# Patient Record
Sex: Male | Born: 1940 | Race: White | Hispanic: No | Smoking: Never smoker
Health system: Southern US, Community
[De-identification: ages and names within clinical notes are randomized; demographics above are authoritative.]

## PROBLEM LIST (undated history)

## (undated) DIAGNOSIS — E119 Type 2 diabetes mellitus without complications: Secondary | ICD-10-CM

## (undated) DIAGNOSIS — I10 Essential (primary) hypertension: Secondary | ICD-10-CM

## (undated) DIAGNOSIS — N183 Chronic kidney disease, stage 3 unspecified: Secondary | ICD-10-CM

## (undated) DIAGNOSIS — R296 Repeated falls: Secondary | ICD-10-CM

## (undated) DIAGNOSIS — I429 Cardiomyopathy, unspecified: Secondary | ICD-10-CM

## (undated) DIAGNOSIS — I4510 Unspecified right bundle-branch block: Secondary | ICD-10-CM

## (undated) DIAGNOSIS — I5042 Chronic combined systolic (congestive) and diastolic (congestive) heart failure: Secondary | ICD-10-CM

## (undated) DIAGNOSIS — C649 Malignant neoplasm of unspecified kidney, except renal pelvis: Secondary | ICD-10-CM

## (undated) DIAGNOSIS — I509 Heart failure, unspecified: Secondary | ICD-10-CM

## (undated) DIAGNOSIS — I350 Nonrheumatic aortic (valve) stenosis: Secondary | ICD-10-CM

## (undated) HISTORY — DX: Type 2 diabetes mellitus without complications: E11.9

## (undated) HISTORY — DX: Essential (primary) hypertension: I10

## (undated) HISTORY — PX: ROTATOR CUFF REPAIR: SHX139

## (undated) HISTORY — DX: Cardiomyopathy, unspecified: I42.9

## (undated) NOTE — *Deleted (*Deleted)
Physical Medicine and Rehabilitation Admission H&P    Chief Complaint  Patient presents with  . Weakness  : HPI: Brett Williamson is an 61 year old right-handed male with history of hypertension, diabetes mellitus.  Per chart review patient lives alone independent prior to admission using a Rollator.  Apartment with one level.  He has a granddaughter girlfriend in the area that check on him routinely.  Presented 06/19/2020 with reports of frequent falls as well as generalized weakness.  No reported loss of consciousness.  CT the head showed moderately large right-sided subdural hematoma with 8 mm leftward midline shift.  Admission chemistry sodium 130 glucose 177 BUN 29 creatinine 1.54 CK 86 hemoglobin 15.3, WBC 14,800, urine culture greater than 100,000 gram-negative rods placed on Rocephin, lactic acid 1.4.  Patient underwent craniotomy hematoma evacuation 06/19/2020 per Dr. Venetia Maxon.  Follow-up MRI 06/21/2020 showed interval decompression of subdural hematoma with significantly improved mass-effect.  Midline shift decreased to 3 mm.  Echocardiogram ejection fraction 55 to 60% no wall motion abnormalities grade 2 diastolic dysfunction.  Maintained on Keppra for seizure prophylaxis.  Wound care nurse follow-up for multiple wounds on scrotum buttocks perineum wound care as directed.  Therapy evaluations completed and patient was admitted for a comprehensive rehab program.  Review of Systems  Constitutional: Positive for malaise/fatigue. Negative for chills and fever.  HENT: Negative for hearing loss.   Eyes: Negative for blurred vision and double vision.  Respiratory: Negative for cough and shortness of breath.   Cardiovascular: Negative for chest pain, palpitations and leg swelling.  Gastrointestinal: Positive for constipation. Negative for heartburn, nausea and vomiting.  Genitourinary: Positive for urgency. Negative for dysuria and hematuria.  Musculoskeletal: Positive for falls and myalgias.   Skin: Negative for rash.  Neurological: Positive for weakness and headaches.  All other systems reviewed and are negative.  History reviewed. No pertinent past medical history. Past Surgical History:  Procedure Laterality Date  . CRANIOTOMY Right 06/19/2020   Procedure: CRANIOTOMY HEMATOMA EVACUATION SUBDURAL;  Surgeon: Maeola Harman, MD;  Location: [facility] OR;  Service: Neurosurgery;  Laterality: Right;   History reviewed. No pertinent family history. Social History:  reports that he has never smoked. He has never used smokeless tobacco. He reports that he does not drink alcohol and does not use drugs. Allergies: No Known Allergies Medications Prior to Admission  Medication Sig Dispense Refill  . benazepril (LOTENSIN) 20 MG tablet Take 20 mg by mouth daily.    . hydrochlorothiazide (HYDRODIURIL) 25 MG tablet Take 25 mg by mouth daily.    Marland Kitchen ibuprofen (ADVIL) 200 MG tablet Take 200 mg by mouth every 4 (four) hours as needed for mild pain.      Drug Regimen Review Drug regimen was reviewed and remains appropriate with no significant issues identified  Home: Home Living Family/patient expects to be discharged to:: Inpatient rehab Living Arrangements: Alone Available Help at Discharge:  (grandaughter lives nearby but only comes over when called; girlfriend lives 2 minutes away but doesnt come over everyday) Type of Home: Apartment Home Layout: One level Bathroom Shower/Tub: Engineer, manufacturing systems: Handicapped height Home Equipment: Grab bars - tub/shower, Grab bars - toilet, Adaptive equipment Adaptive Equipment: Sock aid   Functional History: Prior Function Level of Independence: Independent with assistive device(s) Comments: Pt reports he uses a sock aid for socks. He has long standing numbness/tingling in fingertips bil hands.   Heambulates with RW.  He drives.  He grocery shops sometimes, and daughter and girlfriend assist PRN.  Functional Status:  Mobility: Bed  Mobility Overal bed mobility: Needs Assistance Bed Mobility: Supine to Sit Supine to sit: Mod assist General bed mobility comments: modA with flat bed, pt able to move BLE to EOB but requires increased time, use of rail, and modA to raise trunk from [facility] Transfers Overall transfer level: Needs assistance Equipment used: Rolling walker (2 wheeled) Transfers: Sit to/from Stand, [employer] Transfers Sit to Stand: Min assist Stand pivot transfers: Min assist General transfer comment: modA initially, but able to complete x3 in a row at end of session with minA and verbal cues. but requires ~3 min to complete  Ambulation/Gait Ambulation/Gait assistance: Min assist Gait Distance (Feet): 15 Feet Assistive device: Rolling walker (2 wheeled) Gait Pattern/deviations: Step-through pattern, Decreased stride length, Trunk flexed General Gait Details: pt able to walk in room with RW and minA for line managment/stability. No LOB during session.  Gait velocity: decreased Gait velocity interpretation: <1.31 ft/sec, indicative of household ambulator    ADL: ADL Overall ADL's : Needs assistance/impaired Eating/Feeding: Set up, Sitting, Bed level Grooming: Wash/dry hands, Wash/dry face, Oral care, Brushing hair, Set up, Sitting Upper Body Bathing: Minimal assistance, Sitting Lower Body Bathing: Moderate assistance, Sit to/from stand Upper Body Dressing : Moderate assistance, Sitting Lower Body Dressing: Maximal assistance, Sit to/from stand Lower Body Dressing Details (indicate cue type and reason): uses sock aid at home  Toilet Transfer: Moderate assistance, +2 for physical assistance, +2 for safety/equipment, Stand-pivot, BSC, RW Toileting- Clothing Manipulation and Hygiene: Sit to/from stand, Total assistance Toileting - Clothing Manipulation Details (indicate cue type and reason): Pt incontinent of urine and stool.  Assisted with peri care in standing  Functional mobility during ADLs: Moderate  assistance, +2 for physical assistance, +2 for safety/equipment, Rolling walker  Cognition: Cognition Overall Cognitive Status: Within Functional Limits for tasks assessed Orientation Level: Oriented X4 Cognition Arousal/Alertness: Awake/alert Behavior During Therapy: WFL for tasks assessed/performed Overall Cognitive Status: Within Functional Limits for tasks assessed General Comments: WFL for basic info and tasks, pt able to follow instructions given and navigate around room without cues/assist.   Physical Exam: Blood pressure 138/84, pulse 78, temperature 98.7 F (37.1 C), temperature source Oral, resp. rate 20, height 5\' 11"  (1.803 m), weight 120.7 kg, SpO2 96 %. Physical Exam HENT:     Head:     Comments: Craniotomy site clean and dry Neurological:     Comments: Patient is alert sitting up in bed.  Makes good eye contact with examiner.  Provides name age and date of birth.  Follows simple commands.     Results for orders placed or performed during the hospital encounter of 06/19/20 (from the past 48 hour(s))  Glucose, capillary     Status: Abnormal   Collection Time: 06/23/20  9:16 AM  Result Value Ref Range   Glucose-Capillary 124 (H) 70 - 99 mg/dL    Comment: Glucose reference range applies only to samples taken after fasting for at least 8 hours.  Glucose, capillary     Status: Abnormal   Collection Time: 06/23/20 12:23 PM  Result Value Ref Range   Glucose-Capillary 153 (H) 70 - 99 mg/dL    Comment: Glucose reference range applies only to samples taken after fasting for at least 8 hours.  Glucose, capillary     Status: Abnormal   Collection Time: 06/23/20  5:10 PM  Result Value Ref Range   Glucose-Capillary 182 (H) 70 - 99 mg/dL    Comment: Glucose reference range applies only to  samples taken after fasting for at least 8 hours.  Glucose, capillary     Status: Abnormal   Collection Time: 06/23/20 10:31 PM  Result Value Ref Range   Glucose-Capillary 136 (H) 70 - 99  mg/dL    Comment: Glucose reference range applies only to samples taken after fasting for at least 8 hours.  Glucose, capillary     Status: Abnormal   Collection Time: 06/24/20  8:12 AM  Result Value Ref Range   Glucose-Capillary 150 (H) 70 - 99 mg/dL    Comment: Glucose reference range applies only to samples taken after fasting for at least 8 hours.   Comment 1 Notify RN    Comment 2 Document in Chart   Glucose, capillary     Status: Abnormal   Collection Time: 06/24/20 12:34 PM  Result Value Ref Range   Glucose-Capillary 153 (H) 70 - 99 mg/dL    Comment: Glucose reference range applies only to samples taken after fasting for at least 8 hours.   Comment 1 Notify RN    Comment 2 Document in Chart   Glucose, capillary     Status: Abnormal   Collection Time: 06/24/20  4:42 PM  Result Value Ref Range   Glucose-Capillary 160 (H) 70 - 99 mg/dL    Comment: Glucose reference range applies only to samples taken after fasting for at least 8 hours.   Comment 1 Notify RN    Comment 2 Document in Chart   Glucose, capillary     Status: Abnormal   Collection Time: 06/24/20 10:36 PM  Result Value Ref Range   Glucose-Capillary 182 (H) 70 - 99 mg/dL    Comment: Glucose reference range applies only to samples taken after fasting for at least 8 hours.   No results found.     Medical Problem List and Plan: 1.  Decreased functional mobility with generalized weakness as well as reported multiple falls secondary to traumatic right side subdural hematoma.  Status post craniotomy hematoma evacuation 06/19/2020  -patient may *** shower  -ELOS/Goals: *** 2.  Antithrombotics: -DVT/anticoagulation: SCDs.  -antiplatelet therapy: N/A 3. Pain Management: Hydrocodone as needed 4. Mood: Provide emotional support  -antipsychotic agents: N/A 5. Neuropsych: This patient is capable of making decisions on his own behalf. 6. Skin/Wound Care: Gerhardt Butt cream applied to buttocks scrotum perineal area after  cleansing and drying 7. Fluids/Electrolytes/Nutrition: Routine in and outs with follow-up chemistries 8.  Seizure prophylaxis.  Keppra 500 mg twice daily 9.  Gram-negative rod UTI.  Rocephin 06/20/2020 10.  History of hypertension.  Patient on Lotensin 20 mg daily and HCTZ 25 mg daily prior to admission.  Resume as needed 11.  Diet-controlled diabetes mellitus.  Hemoglobin A1c 6.6.  SSI. ***  Mcarthur Rossetti Darnette Lampron, PA-C 06/25/2020

---

## 2019-07-21 ENCOUNTER — Other Ambulatory Visit: Payer: Self-pay

## 2019-07-21 DIAGNOSIS — Z20822 Contact with and (suspected) exposure to covid-19: Secondary | ICD-10-CM

## 2019-07-22 LAB — NOVEL CORONAVIRUS, NAA: SARS-CoV-2, NAA: NOT DETECTED

## 2019-07-27 ENCOUNTER — Telehealth: Payer: Self-pay | Admitting: *Deleted

## 2019-07-27 NOTE — Telephone Encounter (Signed)
Patient called , given negative covid results .

## 2020-06-11 DIAGNOSIS — S065XAA Traumatic subdural hemorrhage with loss of consciousness status unknown, initial encounter: Secondary | ICD-10-CM | POA: Insufficient documentation

## 2020-06-11 DIAGNOSIS — S065X9A Traumatic subdural hemorrhage with loss of consciousness of unspecified duration, initial encounter: Secondary | ICD-10-CM | POA: Insufficient documentation

## 2020-06-11 HISTORY — DX: Traumatic subdural hemorrhage with loss of consciousness status unknown, initial encounter: S06.5XAA

## 2020-06-11 HISTORY — DX: Traumatic subdural hemorrhage with loss of consciousness of unspecified duration, initial encounter: S06.5X9A

## 2020-06-19 ENCOUNTER — Inpatient Hospital Stay (HOSPITAL_COMMUNITY): Payer: Medicare Other | Admitting: Certified Registered"

## 2020-06-19 ENCOUNTER — Encounter (HOSPITAL_COMMUNITY): Payer: Self-pay | Admitting: Internal Medicine

## 2020-06-19 ENCOUNTER — Emergency Department (HOSPITAL_COMMUNITY): Payer: Medicare Other

## 2020-06-19 ENCOUNTER — Inpatient Hospital Stay (HOSPITAL_COMMUNITY)
Admission: EM | Admit: 2020-06-19 | Discharge: 2020-06-25 | DRG: 026 | Disposition: A | Discharge status: Rehabilitation Facility | Payer: Medicare Other | Attending: Neurosurgery | Admitting: Neurosurgery

## 2020-06-19 ENCOUNTER — Inpatient Hospital Stay (HOSPITAL_COMMUNITY): Payer: Medicare Other

## 2020-06-19 ENCOUNTER — Encounter (HOSPITAL_COMMUNITY)
Admission: EM | Disposition: A | Discharge status: Rehabilitation Facility | Payer: Self-pay | Source: Home / Self Care | Attending: Neurosurgery

## 2020-06-19 DIAGNOSIS — I10 Essential (primary) hypertension: Secondary | ICD-10-CM

## 2020-06-19 DIAGNOSIS — W1800XA Striking against unspecified object with subsequent fall, initial encounter: Secondary | ICD-10-CM | POA: Diagnosis present

## 2020-06-19 DIAGNOSIS — E871 Hypo-osmolality and hyponatremia: Secondary | ICD-10-CM | POA: Diagnosis present

## 2020-06-19 DIAGNOSIS — Z8679 Personal history of other diseases of the circulatory system: Secondary | ICD-10-CM | POA: Diagnosis present

## 2020-06-19 DIAGNOSIS — Z9181 History of falling: Secondary | ICD-10-CM | POA: Diagnosis not present

## 2020-06-19 DIAGNOSIS — W19XXXA Unspecified fall, initial encounter: Secondary | ICD-10-CM | POA: Diagnosis present

## 2020-06-19 DIAGNOSIS — L89151 Pressure ulcer of sacral region, stage 1: Secondary | ICD-10-CM | POA: Diagnosis present

## 2020-06-19 DIAGNOSIS — I4510 Unspecified right bundle-branch block: Secondary | ICD-10-CM | POA: Diagnosis present

## 2020-06-19 DIAGNOSIS — Z79899 Other long term (current) drug therapy: Secondary | ICD-10-CM

## 2020-06-19 DIAGNOSIS — I119 Hypertensive heart disease without heart failure: Secondary | ICD-10-CM | POA: Diagnosis present

## 2020-06-19 DIAGNOSIS — N189 Chronic kidney disease, unspecified: Secondary | ICD-10-CM

## 2020-06-19 DIAGNOSIS — L97229 Non-pressure chronic ulcer of left calf with unspecified severity: Secondary | ICD-10-CM | POA: Diagnosis present

## 2020-06-19 DIAGNOSIS — Z20822 Contact with and (suspected) exposure to covid-19: Secondary | ICD-10-CM | POA: Diagnosis present

## 2020-06-19 DIAGNOSIS — R21 Rash and other nonspecific skin eruption: Secondary | ICD-10-CM | POA: Diagnosis present

## 2020-06-19 DIAGNOSIS — R011 Cardiac murmur, unspecified: Secondary | ICD-10-CM | POA: Diagnosis present

## 2020-06-19 DIAGNOSIS — S065XAA Traumatic subdural hemorrhage with loss of consciousness status unknown, initial encounter: Secondary | ICD-10-CM

## 2020-06-19 DIAGNOSIS — W010XXA Fall on same level from slipping, tripping and stumbling without subsequent striking against object, initial encounter: Secondary | ICD-10-CM | POA: Diagnosis present

## 2020-06-19 DIAGNOSIS — R8281 Pyuria: Secondary | ICD-10-CM | POA: Diagnosis present

## 2020-06-19 DIAGNOSIS — R296 Repeated falls: Secondary | ICD-10-CM | POA: Diagnosis present

## 2020-06-19 DIAGNOSIS — E11622 Type 2 diabetes mellitus with other skin ulcer: Secondary | ICD-10-CM | POA: Diagnosis present

## 2020-06-19 DIAGNOSIS — S065X9A Traumatic subdural hemorrhage with loss of consciousness of unspecified duration, initial encounter: Principal | ICD-10-CM | POA: Diagnosis present

## 2020-06-19 DIAGNOSIS — Z6837 Body mass index (BMI) 37.0-37.9, adult: Secondary | ICD-10-CM | POA: Diagnosis not present

## 2020-06-19 DIAGNOSIS — G8194 Hemiplegia, unspecified affecting left nondominant side: Secondary | ICD-10-CM | POA: Diagnosis present

## 2020-06-19 DIAGNOSIS — E669 Obesity, unspecified: Secondary | ICD-10-CM | POA: Diagnosis present

## 2020-06-19 DIAGNOSIS — E119 Type 2 diabetes mellitus without complications: Secondary | ICD-10-CM | POA: Diagnosis present

## 2020-06-19 DIAGNOSIS — R531 Weakness: Secondary | ICD-10-CM

## 2020-06-19 DIAGNOSIS — N390 Urinary tract infection, site not specified: Secondary | ICD-10-CM

## 2020-06-19 DIAGNOSIS — R7989 Other specified abnormal findings of blood chemistry: Secondary | ICD-10-CM | POA: Diagnosis present

## 2020-06-19 DIAGNOSIS — I6200 Nontraumatic subdural hemorrhage, unspecified: Secondary | ICD-10-CM | POA: Diagnosis present

## 2020-06-19 DIAGNOSIS — W07XXXA Fall from chair, initial encounter: Secondary | ICD-10-CM | POA: Diagnosis present

## 2020-06-19 DIAGNOSIS — J9811 Atelectasis: Secondary | ICD-10-CM | POA: Diagnosis not present

## 2020-06-19 DIAGNOSIS — B9620 Unspecified Escherichia coli [E. coli] as the cause of diseases classified elsewhere: Secondary | ICD-10-CM | POA: Diagnosis present

## 2020-06-19 DIAGNOSIS — E860 Dehydration: Secondary | ICD-10-CM | POA: Diagnosis present

## 2020-06-19 DIAGNOSIS — L8990 Pressure ulcer of unspecified site, unspecified stage: Secondary | ICD-10-CM | POA: Insufficient documentation

## 2020-06-19 HISTORY — PX: CRANIOTOMY: SHX93

## 2020-06-19 LAB — COMPREHENSIVE METABOLIC PANEL
ALT: 15 U/L (ref 0–44)
AST: 18 U/L (ref 15–41)
Albumin: 3.9 g/dL (ref 3.5–5.0)
Alkaline Phosphatase: 83 U/L (ref 38–126)
Anion gap: 10 (ref 5–15)
BUN: 29 mg/dL — ABNORMAL HIGH (ref 8–23)
CO2: 22 mmol/L (ref 22–32)
Calcium: 8.7 mg/dL — ABNORMAL LOW (ref 8.9–10.3)
Chloride: 98 mmol/L (ref 98–111)
Creatinine, Ser: 1.54 mg/dL — ABNORMAL HIGH (ref 0.61–1.24)
GFR, Estimated: 45 mL/min — ABNORMAL LOW (ref >60)
Glucose, Bld: 177 mg/dL — ABNORMAL HIGH (ref 70–99)
Potassium: 4 mmol/L (ref 3.5–5.1)
Sodium: 130 mmol/L — ABNORMAL LOW (ref 135–145)
Total Bilirubin: 2.6 mg/dL — ABNORMAL HIGH (ref 0.3–1.2)
Total Protein: 8.2 g/dL — ABNORMAL HIGH (ref 6.5–8.1)

## 2020-06-19 LAB — CBC WITH DIFFERENTIAL/PLATELET
Abs Immature Granulocytes: 0.07 10*3/uL (ref 0.00–0.07)
Basophils Absolute: 0.1 10*3/uL (ref 0.0–0.1)
Basophils Relative: 1 %
Eosinophils Absolute: 0.1 10*3/uL (ref 0.0–0.5)
Eosinophils Relative: 1 %
HCT: 48.2 % (ref 39.0–52.0)
Hemoglobin: 15.3 g/dL (ref 13.0–17.0)
Immature Granulocytes: 1 %
Lymphocytes Relative: 8 %
Lymphs Abs: 1.2 10*3/uL (ref 0.7–4.0)
MCH: 26.4 pg (ref 26.0–34.0)
MCHC: 31.7 g/dL (ref 30.0–36.0)
MCV: 83.1 fL (ref 80.0–100.0)
Monocytes Absolute: 2.3 10*3/uL — ABNORMAL HIGH (ref 0.1–1.0)
Monocytes Relative: 16 %
Neutro Abs: 11 10*3/uL — ABNORMAL HIGH (ref 1.7–7.7)
Neutrophils Relative %: 73 %
Platelets: 206 10*3/uL (ref 150–400)
RBC: 5.8 MIL/uL (ref 4.22–5.81)
RDW: 15.8 % — ABNORMAL HIGH (ref 11.5–15.5)
WBC: 14.8 10*3/uL — ABNORMAL HIGH (ref 4.0–10.5)
nRBC: 0 % (ref 0.0–0.2)

## 2020-06-19 LAB — URINALYSIS, ROUTINE W REFLEX MICROSCOPIC
Bilirubin Urine: NEGATIVE
Glucose, UA: 150 mg/dL — AB
Ketones, ur: NEGATIVE mg/dL
Nitrite: NEGATIVE
Protein, ur: 100 mg/dL — AB
RBC / HPF: >50 RBC/hpf — ABNORMAL HIGH (ref 0–5)
Specific Gravity, Urine: 1.014 (ref 1.005–1.030)
WBC, UA: >50 WBC/hpf — ABNORMAL HIGH (ref 0–5)
pH: 5 (ref 5.0–8.0)

## 2020-06-19 LAB — TYPE AND SCREEN
ABO/RH(D): O POS
Antibody Screen: NEGATIVE

## 2020-06-19 LAB — LACTIC ACID, PLASMA
Lactic Acid, Venous: 1.8 mmol/L (ref 0.5–1.9)
Lactic Acid, Venous: 1.4 mmol/L (ref 0.5–1.9)

## 2020-06-19 LAB — ABO/RH: ABO/RH(D): O POS

## 2020-06-19 LAB — RESPIRATORY PANEL BY RT PCR (FLU A&B, COVID)
Influenza A by PCR: NEGATIVE
Influenza B by PCR: NEGATIVE
SARS Coronavirus 2 by RT PCR: NEGATIVE

## 2020-06-19 LAB — CK: Total CK: 86 U/L (ref 49–397)

## 2020-06-19 SURGERY — CRANIOTOMY HEMATOMA EVACUATION SUBDURAL
Anesthesia: General | Site: Head | Laterality: Right

## 2020-06-19 MED ORDER — PROMETHAZINE HCL 25 MG/ML IJ SOLN: 6.2500 mg | INTRAMUSCULAR | Status: DC | PRN

## 2020-06-19 MED ORDER — CEFAZOLIN SODIUM 1 G IJ SOLR
INTRAMUSCULAR | Status: AC
  Filled 2020-06-19: qty 20

## 2020-06-19 MED ORDER — PROMETHAZINE HCL 25 MG PO TABS
12.5000 mg | ORAL_TABLET | ORAL | Status: DC | PRN
  Administered 2020-06-22: 12.5 mg via ORAL
  Filled 2020-06-19: qty 1

## 2020-06-19 MED ORDER — BUPIVACAINE-EPINEPHRINE 0.5% -1:200000 IJ SOLN
INTRAMUSCULAR | Status: AC
  Filled 2020-06-19: qty 1

## 2020-06-19 MED ORDER — BUPIVACAINE-EPINEPHRINE 0.5% -1:200000 IJ SOLN
INTRAMUSCULAR | Status: DC | PRN
  Administered 2020-06-19: 5 mL

## 2020-06-19 MED ORDER — CEFAZOLIN SODIUM-DEXTROSE 1-4 GM/50ML-% IV SOLN
INTRAVENOUS | Status: DC | PRN
  Administered 2020-06-19: 2 g via INTRAVENOUS

## 2020-06-19 MED ORDER — SODIUM CHLORIDE 0.9 % IV SOLN
1.0000 g | Freq: Once | INTRAVENOUS | Status: AC
  Administered 2020-06-19: 1 g via INTRAVENOUS
  Filled 2020-06-19: qty 10

## 2020-06-19 MED ORDER — FLEET ENEMA 7-19 GM/118ML RE ENEM: 1.0000 | ENEMA | Freq: Once | RECTAL | Status: DC | PRN

## 2020-06-19 MED ORDER — ALBUMIN HUMAN 5 % IV SOLN: INTRAVENOUS | Status: DC | PRN

## 2020-06-19 MED ORDER — FENTANYL CITRATE (PF) 250 MCG/5ML IJ SOLN
INTRAMUSCULAR | Status: AC
  Filled 2020-06-19: qty 5

## 2020-06-19 MED ORDER — INSULIN ASPART 100 UNIT/ML ~~LOC~~ SOLN
0.0000 [IU] | Freq: Three times a day (TID) | SUBCUTANEOUS | Status: DC
  Administered 2020-06-20: 1 [IU] via SUBCUTANEOUS
  Administered 2020-06-20: 2 [IU] via SUBCUTANEOUS
  Administered 2020-06-21: 1 [IU] via SUBCUTANEOUS
  Administered 2020-06-21 – 2020-06-22 (×4): 2 [IU] via SUBCUTANEOUS
  Administered 2020-06-22: 1 [IU] via SUBCUTANEOUS
  Administered 2020-06-23: 2 [IU] via SUBCUTANEOUS
  Administered 2020-06-23: 1 [IU] via SUBCUTANEOUS
  Administered 2020-06-23 – 2020-06-24 (×3): 2 [IU] via SUBCUTANEOUS
  Administered 2020-06-24 – 2020-06-25 (×2): 1 [IU] via SUBCUTANEOUS

## 2020-06-19 MED ORDER — CEFAZOLIN SODIUM-DEXTROSE 2-4 GM/100ML-% IV SOLN
2.0000 g | Freq: Three times a day (TID) | INTRAVENOUS | Status: AC
  Administered 2020-06-19 – 2020-06-20 (×2): 2 g via INTRAVENOUS
  Filled 2020-06-19 (×2): qty 100

## 2020-06-19 MED ORDER — PHENYLEPHRINE HCL-NACL 10-0.9 MG/250ML-% IV SOLN
INTRAVENOUS | Status: AC
  Filled 2020-06-19: qty 250

## 2020-06-19 MED ORDER — FENTANYL CITRATE (PF) 100 MCG/2ML IJ SOLN: 25.0000 ug | INTRAMUSCULAR | Status: DC | PRN

## 2020-06-19 MED ORDER — CHLORHEXIDINE GLUCONATE CLOTH 2 % EX PADS
6.0000 | MEDICATED_PAD | Freq: Every day | CUTANEOUS | Status: DC
  Administered 2020-06-20 – 2020-06-24 (×5): 6 via TOPICAL

## 2020-06-19 MED ORDER — ONDANSETRON HCL 4 MG PO TABS: 4.0000 mg | ORAL_TABLET | ORAL | Status: DC | PRN

## 2020-06-19 MED ORDER — ROCURONIUM BROMIDE 10 MG/ML (PF) SYRINGE
PREFILLED_SYRINGE | INTRAVENOUS | Status: AC
  Filled 2020-06-19: qty 10

## 2020-06-19 MED ORDER — PHENYLEPHRINE HCL-NACL 10-0.9 MG/250ML-% IV SOLN
INTRAVENOUS | Status: DC | PRN
  Administered 2020-06-19: 50 ug/min via INTRAVENOUS

## 2020-06-19 MED ORDER — THROMBIN 5000 UNITS EX SOLR
OROMUCOSAL | Status: DC | PRN
  Administered 2020-06-19: 5 mL via TOPICAL

## 2020-06-19 MED ORDER — SODIUM CHLORIDE 0.9 % IV BOLUS
500.0000 mL | Freq: Once | INTRAVENOUS | Status: AC
  Administered 2020-06-19: 500 mL via INTRAVENOUS

## 2020-06-19 MED ORDER — ONDANSETRON HCL 4 MG/2ML IJ SOLN
INTRAMUSCULAR | Status: AC
  Filled 2020-06-19: qty 2

## 2020-06-19 MED ORDER — BISACODYL 10 MG RE SUPP: 10.0000 mg | Freq: Every day | RECTAL | Status: DC | PRN

## 2020-06-19 MED ORDER — ACETAMINOPHEN 325 MG PO TABS
650.0000 mg | ORAL_TABLET | ORAL | Status: DC | PRN
  Administered 2020-06-20 – 2020-06-25 (×4): 650 mg via ORAL
  Filled 2020-06-19 (×4): qty 2

## 2020-06-19 MED ORDER — HYDROCODONE-ACETAMINOPHEN 5-325 MG PO TABS: 1.0000 | ORAL_TABLET | ORAL | Status: DC | PRN

## 2020-06-19 MED ORDER — LIDOCAINE 2% (20 MG/ML) 5 ML SYRINGE
INTRAMUSCULAR | Status: AC
  Filled 2020-06-19: qty 5

## 2020-06-19 MED ORDER — ONDANSETRON HCL 4 MG/2ML IJ SOLN
4.0000 mg | INTRAMUSCULAR | Status: DC | PRN
  Administered 2020-06-22: 4 mg via INTRAVENOUS
  Filled 2020-06-19 (×2): qty 2

## 2020-06-19 MED ORDER — FENTANYL CITRATE (PF) 100 MCG/2ML IJ SOLN
INTRAMUSCULAR | Status: DC | PRN
  Administered 2020-06-19: 150 ug via INTRAVENOUS

## 2020-06-19 MED ORDER — LEVETIRACETAM IN NACL 500 MG/100ML IV SOLN
500.0000 mg | Freq: Two times a day (BID) | INTRAVENOUS | Status: DC
  Administered 2020-06-19 – 2020-06-21 (×5): 500 mg via INTRAVENOUS
  Filled 2020-06-19 (×5): qty 100

## 2020-06-19 MED ORDER — DOCUSATE SODIUM 100 MG PO CAPS
100.0000 mg | ORAL_CAPSULE | Freq: Two times a day (BID) | ORAL | Status: DC
  Administered 2020-06-19 – 2020-06-24 (×6): 100 mg via ORAL
  Filled 2020-06-19 (×9): qty 1

## 2020-06-19 MED ORDER — ROCURONIUM BROMIDE 10 MG/ML (PF) SYRINGE
PREFILLED_SYRINGE | INTRAVENOUS | Status: DC | PRN
  Administered 2020-06-19: 40 mg via INTRAVENOUS

## 2020-06-19 MED ORDER — ONDANSETRON HCL 4 MG/2ML IJ SOLN
INTRAMUSCULAR | Status: DC | PRN
  Administered 2020-06-19: 4 mg via INTRAVENOUS

## 2020-06-19 MED ORDER — BACITRACIN ZINC 500 UNIT/GM EX OINT
TOPICAL_OINTMENT | CUTANEOUS | Status: AC
  Filled 2020-06-19: qty 28.35

## 2020-06-19 MED ORDER — PANTOPRAZOLE SODIUM 40 MG IV SOLR
40.0000 mg | Freq: Every day | INTRAVENOUS | Status: DC
  Administered 2020-06-19 – 2020-06-20 (×2): 40 mg via INTRAVENOUS
  Filled 2020-06-19 (×2): qty 40

## 2020-06-19 MED ORDER — THROMBIN 20000 UNITS EX SOLR
CUTANEOUS | Status: AC
  Filled 2020-06-19: qty 20000

## 2020-06-19 MED ORDER — HEMOSTATIC AGENTS (NO CHARGE) OPTIME
TOPICAL | Status: DC | PRN
  Administered 2020-06-19: 1 via TOPICAL

## 2020-06-19 MED ORDER — ACETAMINOPHEN 10 MG/ML IV SOLN
INTRAVENOUS | Status: DC | PRN
  Administered 2020-06-19: 1000 mg via INTRAVENOUS

## 2020-06-19 MED ORDER — SENNOSIDES-DOCUSATE SODIUM 8.6-50 MG PO TABS: 1.0000 | ORAL_TABLET | Freq: Every evening | ORAL | Status: DC | PRN

## 2020-06-19 MED ORDER — SODIUM CHLORIDE 0.9 % IV SOLN: INTRAVENOUS | Status: AC

## 2020-06-19 MED ORDER — BUPIVACAINE HCL (PF) 0.5 % IJ SOLN
INTRAMUSCULAR | Status: AC
  Filled 2020-06-19: qty 30

## 2020-06-19 MED ORDER — SUGAMMADEX SODIUM 200 MG/2ML IV SOLN
INTRAVENOUS | Status: DC | PRN
  Administered 2020-06-19: 200 mg via INTRAVENOUS

## 2020-06-19 MED ORDER — SUCCINYLCHOLINE CHLORIDE 200 MG/10ML IV SOSY
PREFILLED_SYRINGE | INTRAVENOUS | Status: DC | PRN
  Administered 2020-06-19: 100 mg via INTRAVENOUS

## 2020-06-19 MED ORDER — PHENYLEPHRINE 40 MCG/ML (10ML) SYRINGE FOR IV PUSH (FOR BLOOD PRESSURE SUPPORT)
PREFILLED_SYRINGE | INTRAVENOUS | Status: AC
  Filled 2020-06-19: qty 10

## 2020-06-19 MED ORDER — ACETAMINOPHEN 325 MG PO TABS: 650.0000 mg | ORAL_TABLET | Freq: Four times a day (QID) | ORAL | Status: DC | PRN

## 2020-06-19 MED ORDER — ONDANSETRON HCL 4 MG/2ML IJ SOLN: 4.0000 mg | Freq: Four times a day (QID) | INTRAMUSCULAR | Status: DC | PRN

## 2020-06-19 MED ORDER — ACETAMINOPHEN 10 MG/ML IV SOLN
INTRAVENOUS | Status: AC
  Filled 2020-06-19: qty 100

## 2020-06-19 MED ORDER — SODIUM CHLORIDE 0.9 % IV SOLN
1.0000 g | INTRAVENOUS | Status: DC
  Administered 2020-06-20 – 2020-06-24 (×5): 1 g via INTRAVENOUS
  Filled 2020-06-19 (×2): qty 1
  Filled 2020-06-19 (×2): qty 10
  Filled 2020-06-19 (×2): qty 1
  Filled 2020-06-19: qty 10

## 2020-06-19 MED ORDER — PROPOFOL 10 MG/ML IV BOLUS
INTRAVENOUS | Status: DC | PRN
  Administered 2020-06-19: 140 mg via INTRAVENOUS

## 2020-06-19 MED ORDER — 0.9 % SODIUM CHLORIDE (POUR BTL) OPTIME
TOPICAL | Status: DC | PRN
  Administered 2020-06-19 (×3): 1000 mL

## 2020-06-19 MED ORDER — ONDANSETRON HCL 4 MG PO TABS: 4.0000 mg | ORAL_TABLET | Freq: Four times a day (QID) | ORAL | Status: DC | PRN

## 2020-06-19 MED ORDER — SUCCINYLCHOLINE CHLORIDE 200 MG/10ML IV SOSY
PREFILLED_SYRINGE | INTRAVENOUS | Status: AC
  Filled 2020-06-19: qty 10

## 2020-06-19 MED ORDER — LIDOCAINE-EPINEPHRINE 1 %-1:100000 IJ SOLN
INTRAMUSCULAR | Status: AC
  Filled 2020-06-19: qty 1

## 2020-06-19 MED ORDER — LIDOCAINE 2% (20 MG/ML) 5 ML SYRINGE
INTRAMUSCULAR | Status: DC | PRN
  Administered 2020-06-19: 60 mg via INTRAVENOUS

## 2020-06-19 MED ORDER — HYDROMORPHONE HCL 1 MG/ML IJ SOLN: 0.5000 mg | INTRAMUSCULAR | Status: DC | PRN

## 2020-06-19 MED ORDER — LIDOCAINE-EPINEPHRINE 1 %-1:100000 IJ SOLN
INTRAMUSCULAR | Status: DC | PRN
  Administered 2020-06-19: 5 mL

## 2020-06-19 MED ORDER — THROMBIN 5000 UNITS EX SOLR
CUTANEOUS | Status: AC
  Filled 2020-06-19: qty 5000

## 2020-06-19 MED ORDER — LABETALOL HCL 5 MG/ML IV SOLN: 10.0000 mg | INTRAVENOUS | Status: DC | PRN

## 2020-06-19 MED ORDER — POTASSIUM CHLORIDE IN NACL 20-0.9 MEQ/L-% IV SOLN
INTRAVENOUS | Status: DC
  Filled 2020-06-19 (×4): qty 1000

## 2020-06-19 MED ORDER — ACETAMINOPHEN 650 MG RE SUPP: 650.0000 mg | Freq: Four times a day (QID) | RECTAL | Status: DC | PRN

## 2020-06-19 MED ORDER — ACETAMINOPHEN 650 MG RE SUPP: 650.0000 mg | RECTAL | Status: DC | PRN

## 2020-06-19 SURGICAL SUPPLY — 73 items
BASKET BONE COLLECTION (BASKET) IMPLANT
BIT DRILL WIRE PASS 1.3MM (BIT) IMPLANT
BNDG CMPR 75X41 PLY HI ABS (GAUZE/BANDAGES/DRESSINGS)
BNDG GAUZE ELAST 4 BULKY (GAUZE/BANDAGES/DRESSINGS) IMPLANT
BNDG STRETCH 4X75 STRL LF (GAUZE/BANDAGES/DRESSINGS) IMPLANT
BUR ACORN 6.0 PRECISION (BURR) ×2 IMPLANT
BUR ACORN 6.0MM PRECISION (BURR) ×1
BUR SPIRAL ROUTER 2.3 (BUR) ×2 IMPLANT
BUR SPIRAL ROUTER 2.3MM (BUR) ×1
CANISTER SUCT 3000ML PPV (MISCELLANEOUS) ×6 IMPLANT
CARTRIDGE OIL MAESTRO DRILL (MISCELLANEOUS) ×1 IMPLANT
CATH ROBINSON RED A/P 12FR (CATHETERS) IMPLANT
CLIP VESOCCLUDE MED 6/CT (CLIP) IMPLANT
COVER WAND RF STERILE (DRAPES) ×3 IMPLANT
DECANTER SPIKE VIAL GLASS SM (MISCELLANEOUS) IMPLANT
DIFFUSER DRILL AIR PNEUMATIC (MISCELLANEOUS) ×3 IMPLANT
DRAIN JACKSON PRATT 10MM FLAT (MISCELLANEOUS) ×3 IMPLANT
DRAIN PENROSE 1/2X12 LTX STRL (WOUND CARE) IMPLANT
DRAPE NEUROLOGICAL W/INCISE (DRAPES) ×3 IMPLANT
DRAPE WARM FLUID 44X44 (DRAPES) ×3 IMPLANT
DRILL WIRE PASS 1.3MM (BIT)
DRSG OPSITE POSTOP 3X4 (GAUZE/BANDAGES/DRESSINGS) ×3 IMPLANT
DRSG OPSITE POSTOP 4X6 (GAUZE/BANDAGES/DRESSINGS) ×6 IMPLANT
DRSG PAD ABDOMINAL 8X10 ST (GAUZE/BANDAGES/DRESSINGS) IMPLANT
DURAPREP 6ML APPLICATOR 50/CS (WOUND CARE) ×3 IMPLANT
ELECT REM PT RETURN 9FT ADLT (ELECTROSURGICAL) ×3
ELECTRODE REM PT RTRN 9FT ADLT (ELECTROSURGICAL) ×1 IMPLANT
EVACUATOR SILICONE 100CC (DRAIN) ×3 IMPLANT
GAUZE 4X4 16PLY RFD (DISPOSABLE) IMPLANT
GAUZE SPONGE 4X4 12PLY STRL (GAUZE/BANDAGES/DRESSINGS) IMPLANT
GLOVE BIO SURGEON STRL SZ8 (GLOVE) ×6 IMPLANT
GLOVE BIOGEL PI IND STRL 7.0 (GLOVE) ×2 IMPLANT
GLOVE BIOGEL PI IND STRL 8 (GLOVE) ×2 IMPLANT
GLOVE BIOGEL PI IND STRL 8.5 (GLOVE) ×2 IMPLANT
GLOVE BIOGEL PI INDICATOR 7.0 (GLOVE) ×4
GLOVE BIOGEL PI INDICATOR 8 (GLOVE) ×4
GLOVE BIOGEL PI INDICATOR 8.5 (GLOVE) ×4
GLOVE ECLIPSE 8.0 STRL XLNG CF (GLOVE) ×3 IMPLANT
GLOVE EXAM NITRILE XL STR (GLOVE) IMPLANT
GOWN STRL REUS W/ TWL LRG LVL3 (GOWN DISPOSABLE) IMPLANT
GOWN STRL REUS W/ TWL XL LVL3 (GOWN DISPOSABLE) ×3 IMPLANT
GOWN STRL REUS W/TWL 2XL LVL3 (GOWN DISPOSABLE) IMPLANT
GOWN STRL REUS W/TWL LRG LVL3 (GOWN DISPOSABLE)
GOWN STRL REUS W/TWL XL LVL3 (GOWN DISPOSABLE) ×9
HEMOSTAT POWDER KIT SURGIFOAM (HEMOSTASIS) ×3 IMPLANT
HEMOSTAT SURGICEL 2X14 (HEMOSTASIS) ×3 IMPLANT
KIT BASIN OR (CUSTOM PROCEDURE TRAY) ×3 IMPLANT
KIT TURNOVER KIT B (KITS) ×3 IMPLANT
NEEDLE HYPO 25X1 1.5 SAFETY (NEEDLE) ×3 IMPLANT
NS IRRIG 1000ML POUR BTL (IV SOLUTION) ×9 IMPLANT
OIL CARTRIDGE MAESTRO DRILL (MISCELLANEOUS) ×3
PACK CRANIOTOMY CUSTOM (CUSTOM PROCEDURE TRAY) ×3 IMPLANT
PAD ARMBOARD 7.5X6 YLW CONV (MISCELLANEOUS) ×3 IMPLANT
PATTIES SURGICAL .5 X.5 (GAUZE/BANDAGES/DRESSINGS) IMPLANT
PATTIES SURGICAL .5 X3 (DISPOSABLE) IMPLANT
PATTIES SURGICAL 1X1 (DISPOSABLE) IMPLANT
PIN MAYFIELD SKULL DISP (PIN) IMPLANT
PLATE DOUBLE Y CMF 6H (Plate) ×9 IMPLANT
SCREW UNIII AXS SD 1.5X4 (Screw) ×33 IMPLANT
SPECIMEN JAR SMALL (MISCELLANEOUS) IMPLANT
SPONGE NEURO XRAY DETECT 1X3 (DISPOSABLE) IMPLANT
SPONGE SURGIFOAM ABS GEL 100 (HEMOSTASIS) IMPLANT
STAPLER SKIN PROX WIDE 3.9 (STAPLE) ×3 IMPLANT
SUT ETHILON 3 0 PS 1 (SUTURE) IMPLANT
SUT NURALON 4 0 TR CR/8 (SUTURE) ×3 IMPLANT
SUT VIC AB 2-0 CP2 18 (SUTURE) ×3 IMPLANT
SUT VIC AB 3-0 SH 8-18 (SUTURE) ×3 IMPLANT
SYR CONTROL 10ML LL (SYRINGE) IMPLANT
TOWEL GREEN STERILE (TOWEL DISPOSABLE) ×3 IMPLANT
TOWEL GREEN STERILE FF (TOWEL DISPOSABLE) ×3 IMPLANT
TRAY FOLEY MTR SLVR 16FR STAT (SET/KITS/TRAYS/PACK) IMPLANT
UNDERPAD 30X36 HEAVY ABSORB (UNDERPADS AND DIAPERS) IMPLANT
WATER STERILE IRR 1000ML POUR (IV SOLUTION) ×3 IMPLANT

## 2020-06-19 NOTE — ED Provider Notes (Signed)
[facility] EMERGENCY DEPARTMENT Provider Note   CSN: [id number] Arrival date & time: 06/19/20  1003     History Chief Complaint  Patient presents with  . Weakness    Brett Williamson is a 79 y.o. male.  79 year old male presents from home with complaint of generalized weakness.  Patient states that he lives alone, fell on Sunday because he was weak, called family and they helped him get back to his chair.  Patient then had a 2nd fall and family helped him back to the chair again.  Patient denies any injuries as a result of these falls, denies feeling dizzy, lightheaded, short of breath or having chest pain.  Patient states that he was too weak to get out of the chair and has remained in the chair since Sunday (2 days ago). Denies hitting head or LOC. Denies taking any anticoagulants.         No past medical history on file.  There are no problems to display for this patient.  History not available, patient states he is not anticoagulated, takes unknown BP pill and takes and IBU as needed. Specifically denies heart history and states his doctor tells him his heart is fine.    No family history on file.  Social History   Tobacco Use  . Smoking status: Not on file  Substance Use Topics  . Alcohol use: Not on file  . Drug use: Not on file    Home Medications Prior to Admission medications   Not on File    Allergies    Patient has no allergy information on record.  Review of Systems   Review of Systems  Constitutional: Negative for chills and fever.  Respiratory: Negative for shortness of breath.   Cardiovascular: Negative for chest pain.  Gastrointestinal: Negative for abdominal pain, constipation, diarrhea, nausea and vomiting.  Genitourinary: Negative for difficulty urinating and dysuria.  Musculoskeletal: Negative for arthralgias, back pain and myalgias.  Skin: Positive for wound.  Neurological: Positive for weakness. Negative for dizziness and headaches.   Psychiatric/Behavioral: Negative for confusion.  All other systems reviewed and are negative.   Physical Exam Updated Vital Signs BP 128/62   Pulse 92   Temp 98.3 F (36.8 C) (Oral)   Resp (!) 21   SpO2 97%   Physical Exam Vitals and nursing note reviewed.  Constitutional:      General: He is not in acute distress.    Appearance: He is well-developed. He is obese. He is not diaphoretic.  HENT:     Head: Normocephalic and atraumatic.  Eyes:     Extraocular Movements: Extraocular movements intact.     Pupils: Pupils are equal, round, and reactive to light.  Cardiovascular:     Rate and Rhythm: Normal rate and regular rhythm.     Pulses: Normal pulses.     Heart sounds: Normal heart sounds.  Pulmonary:     Effort: Pulmonary effort is normal.     Breath sounds: Normal breath sounds.  Abdominal:     Palpations: Abdomen is soft.     Tenderness: There is no abdominal tenderness.  Musculoskeletal:        General: No tenderness.     Cervical back: No tenderness or bony tenderness. No pain with movement. Normal range of motion.     Thoracic back: No tenderness or bony tenderness.     Lumbar back: No tenderness or bony tenderness.     Right lower leg: Edema present.     Left  lower leg: Edema present.     Comments: Pitting edema to bilateral feet  Skin:    General: Skin is warm and dry.     Findings: Rash present.     Comments: Stage 1/2 sacral decubitus ulcer, ulcer to left lower leg posteriorly. Chronic skin changes to anterior lower legs bilaterally.  Erythema, breakdown to lower abdominal/genital area likely from sitting in soiled clothes.   Neurological:     Mental Status: He is alert and oriented to person, place, and time.     Sensory: No sensory deficit.     Motor: Weakness present.     Comments: Reports generalized weakness, question left arm weakness- seems to hold/favor left arm however patient denies weakness in this arm, found to have equal grip strength, able to  hold arm extended (needed assistance extending arm initially).  Psychiatric:        Behavior: Behavior normal.     ED Results / Procedures / Treatments   Labs (all labs ordered are listed, but only abnormal results are displayed) Labs Reviewed  COMPREHENSIVE METABOLIC PANEL - Abnormal; Notable for the following components:      Result Value   Sodium 130 (*)    Glucose, Bld 177 (*)    BUN 29 (*)    Creatinine, Ser 1.54 (*)    Calcium 8.7 (*)    Total Protein 8.2 (*)    Total Bilirubin 2.6 (*)    GFR, Estimated 45 (*)    All other components within normal limits  CBC WITH DIFFERENTIAL/PLATELET - Abnormal; Notable for the following components:   WBC 14.8 (*)    RDW 15.8 (*)    Neutro Abs 11.0 (*)    Monocytes Absolute 2.3 (*)    All other components within normal limits  RESPIRATORY PANEL BY RT PCR (FLU A&B, COVID)  CK  LACTIC ACID, PLASMA  URINALYSIS, ROUTINE W REFLEX MICROSCOPIC  LACTIC ACID, PLASMA    EKG None  Radiology CT Head Wo Contrast  Result Date: 06/19/2020 CLINICAL DATA:  Dizziness with imbalance for 4 days. Multiple falls. EXAM: CT HEAD WITHOUT CONTRAST TECHNIQUE: Contiguous axial images were obtained from the base of the skull through the vertex without intravenous contrast. COMPARISON:  None. FINDINGS: Brain: A moderately large mixed density subdural hematoma over the right frontoparietal convexity measures up to 2.5 cm in thickness. There is mass effect on the underlying frontal and parietal lobes with sulcal effacement, partial effacement of the right lateral ventricle, and 8 mm of leftward midline shift. No acute infarct is identified. Mild cerebral atrophy is within normal limits for age. Vascular: Calcified atherosclerosis at the skull base. No hyperdense vessel. Skull: No fracture or suspicious osseous lesion. Sinuses/Orbits: Paranasal sinuses and mastoid air cells are clear. Bilateral cataract extraction. Other: None. IMPRESSION: Moderately large right-sided  subdural hematoma with 8 mm of leftward midline shift. Critical Value/emergent results were called by telephone at the time of interpretation on 06/19/2020 at 11:42 am to provider Suella Broad, who verbally acknowledged these results. Electronically Signed   By: Logan Bores M.D.   On: 06/19/2020 11:43   DG Chest Port 1 View  Result Date: 06/19/2020 CLINICAL DATA:  Sudden onset of weakness on Sunday. EXAM: PORTABLE CHEST 1 VIEW COMPARISON:  None. FINDINGS: The cardiac silhouette, mediastinal and hilar contours are within normal limits given the patient's age, AP projection and portable technique. Mild eventration of the right hemidiaphragm. No infiltrates, edema or effusions. No pneumothorax. The bony thorax is grossly intact. IMPRESSION:  No acute cardiopulmonary findings. Electronically Signed   By: Marijo Sanes M.D.   On: 06/19/2020 12:20    Procedures .Critical Care Performed by: Tacy Learn, PA-C Authorized by: Tacy Learn, PA-C   Critical care provider statement:    Critical care time (minutes):  45   Critical care was time spent personally by me on the following activities:  Discussions with consultants, evaluation of patient's response to treatment, examination of patient, ordering and performing treatments and interventions, ordering and review of laboratory studies, ordering and review of radiographic studies, pulse oximetry, re-evaluation of patient's condition, obtaining history from patient or surrogate and review of old charts   (including critical care time)  Medications Ordered in ED Medications  sodium chloride 0.9 % bolus 500 mL (500 mLs Intravenous New Bag/Given 06/19/20 1304)    ED Course  I have reviewed the triage vital signs and the nursing notes.  Pertinent labs & imaging results that were available during my care of the patient were reviewed by me and considered in my medical decision making (see chart for details).  Clinical Course as of Jun 19 1329  Tue  Jun 19, 1353  7559 79 year old male brought in by home for generalized weakness report of fall x2 2 days ago.  Patient has been seeing a recliner for the past 2 days in his own urine, too weak to ambulate to the bathroom.  Patient reports feeling weak, denies any other complaints.  On exam, found to have questionable left arm weakness, is able to grip with left hand but requires specific direction, when asked to raise his left arm, he struggles to raise the arm but if assisted, is able to maintain extended arm.  No other focal findings.  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 Patient was bathed by nursing staff. Labs returned with mild leukocytosis with white count of 14.8, no infectious symptoms or concerns.  CMP with mild hyponatremia with sodium of 130, dehydrated with creatinine of 1.54 and BUN of 29, given IV fluids, no prior labs for comparison.  Bilirubin is 2.6 with normal LFTs.  Lactic acid reassuring at 1.8, CK within normal limits at 86.    [LM]  1312 CT had a moderately large right-sided subdural hematoma with 8 mm of leftward midline shift. Discussed with neurosurgery, plan is to take patient to the OR around 530 tonight at [facility], will need transfer.  Call to hospitalist to arrange for admission and transfer.   [LM]  1329 Discussed with Dr. Carles Collet with [facility] who will consult for admission.    [LM]  1329 Discussed results and plan of care with patient.    [LM]    Clinical Course User Index [LM] Roque Lias   MDM Rules/Calculators/A&P                          Final Clinical Impression(s) / ED Diagnoses Final diagnoses:  Subdural hematoma [facility])    Rx / DC Orders ED Discharge Orders    None       Tacy Learn, PA-C 06/19/20 1330    [name], MD 06/19/20 1524

## 2020-06-19 NOTE — Transfer of Care (Signed)
Immediate Anesthesia Transfer of Care Note  Patient: Brett Williamson  Procedure(s) Performed: CRANIOTOMY HEMATOMA EVACUATION SUBDURAL (Right Head)  Patient Location: PACU  Anesthesia Type:General  Level of Consciousness: awake, alert , oriented and patient cooperative  Airway & Oxygen Therapy: Patient Spontanous Breathing and Patient connected to face mask oxygen  Post-op Assessment: Report given to RN and Post -op Vital signs reviewed and stable  Post vital signs: Reviewed and stable  Last Vitals:  Vitals Value Taken Time  BP 122/78 06/19/20 1918  Temp    Pulse 97 06/19/20 1920  Resp 18 06/19/20 1920  SpO2 98 % 06/19/20 1920  Vitals shown include unvalidated device data.  Last Pain:  Vitals:   06/19/20 1545  TempSrc: Oral  PainSc: 0-No pain      Patients Stated Pain Goal: 0 (99/69/24 9324)  Complications: No complications documented.

## 2020-06-19 NOTE — ED Triage Notes (Addendum)
Pt had a sudden onset of weakness on Sunday. Fell on Sunday and was unable to get up from floor. Family assisted patient up and put him in chair. Pt sat in chair sin Sunday, unable to get up.  Pt from home and lives by himself. . VSS. CBG 221. Temp 97.9. Pt is taking medications normally. Smells of foul urine.

## 2020-06-19 NOTE — H&P (Signed)
History and Physical  Brett Williamson CBJ:[id number] DOB: 12/10/1940 DOA: 06/19/2020   PCP: Neale Burly, MD   Patient coming from: Home  Chief Complaint: generalized weakness  HPI:  Brett Williamson is a 79 y.o. male with medical history of hypertension diabetes mellitus type 2 presenting with worsening generalized weakness to the point where he is unable to get out of his chair.  At baseline, the patient lives by himself, but he has had a history of frequent falls.  In the past week, the patient has fallen with increasing frequency.  Notably, the patient had 4 falls on 06/17/2020.  With each fall, the patient had denied any loss of consciousness, prodromal symptoms.  He denied any fevers, chills, chest pain, shortness breath, coughing, hemoptysis, nausea, vomiting, diarrhea, domino pain, dysuria, hematuria. At baseline, the patient ambulates with the assistance of a walker.  He initially fell when he tripped going to the bathroom.  He called his family who came to put him back in his lift chair.  He subsequently fell 12 sliding off of his lift chair.  Each time, he will call his family to come put them back in the left ear.  Regarding his fourth fall, the patient got up to go to the bathroom once again and fell hitting his head.  He denied any loss of consciousness.  Once again, the patient's family came to place him back in his chair.  The patient had increasing generalized weakness to the point where he has basically been sitting in his left ear for the past 48 hours.  Because of his increasing generalized weakness, EMS was contacted to bring the patient to the emergency department.  In the emergency department, the patient was afebrile and hemodynamically stable with oxygen saturation 90-100% room air.  BMP showed sodium 130, potassium 4.0, serum creatinine 1.54.  WBC 14.8, hemoglobin 15.3, platelets 206,000.  AST 80, ALT 15, alk phosphatase 83, total bilirubin 2.6.  EKG shows sinus rhythm with  right bundle branch block.  Lactic acid 1.8.  CPK 86.  Chest x-ray showed bibasilar atelectasis.  CT of the brain showed moderately large right subdural hematoma with 8 mm leftward midline shift.  Neurosurgery was contacted and recommended transfer to [facility] to go to the operating room for evacuation of his SDH.  Notably, the patient states that he takes 6 ibuprofen on a daily basis for the past 2 to 3 years.  He takes this for his usual aches and pains.  He denies any other over-the-counter medications.  He endorses compliance with his antihypertensive medications which includes benazepril and HCTZ.  He denies any previous history of heart attack or stroke.  Assessment/Plan: Subdural Hematoma -CT brain--moderately large SDH with 42m Leftward midline shift -EDP spoke with neurosurgery-->[facility] -PT eval after surgery  Frequent falls/generalized weakness -PT evaluation -Serum BH60-TSH -Folic acid -UVP>71WBC  Pyuria -Suspect underlying UTI -Obtain urine culture -Start empiric ceftriaxone  Hyponatremia -start gentle NS  Essential hypertension -Holding benazepril and HCTZ temporarily  Elevated serum creatinine -Suspect underlying CKD in the setting of diabetic and hypertension history -No previous values to compare -renal [place]-A.m. BMP  Diabetes mellitus, type II -Patient takes some type of oral hypoglycemic agent prn -Hemoglobin A1c -novolog sliding scale  Heart murmur -Suspect aortic stenosis -Obtain echocardiogram     PMHx--HTN, DM2  PSurg Hx--left rotator cuff, appendix  Social--never smoked.  Denies alcohol or drugs.  Retired  [employer]  Hx--no pertinent family history    Prior to Admission medications   Medication Sig Start Date End Date Taking? Authorizing Provider  benazepril (LOTENSIN) 20 MG tablet Take 20 mg by mouth daily. 05/08/20  Yes [provider]  hydrochlorothiazide (HYDRODIURIL) 25 MG tablet Take 25 mg  by mouth daily. 04/28/20  Yes [provider]  ibuprofen (ADVIL) 200 MG tablet Take 200 mg by mouth every 4 (four) hours as needed for mild pain.   Yes [provider]    Review of Systems:  Constitutional:  No weight loss, night sweats, Fevers, chills, fatigue.  Head&Eyes: No headache.  No vision loss.  No eye pain or scotoma ENT:  No Difficulty swallowing,Tooth/dental problems,Sore throat,  No ear ache, post nasal drip,  Cardio-vascular:  No chest pain, Orthopnea, PND,  dizziness, palpitations  GI:  No  abdominal pain, nausea, vomiting, diarrhea, loss of appetite, hematochezia, melena, heartburn, indigestion, Resp:  No shortness of breath with exertion or at rest. No cough. No coughing up of blood .No wheezing.No chest wall deformity  Skin:  no rash or lesions.  GU:  no dysuria, change in color of urine, no urgency or frequency. No flank pain.  Musculoskeletal:  No joint pain or swelling. No decreased range of motion. No back pain.  Psych:  No change in mood or affect. No depression or anxiety. Neurologic: No headache, no dysesthesia, no focal weakness, no vision loss. No syncope  Physical Exam: Vitals:   06/19/20 1130 06/19/20 1200 06/19/20 1230 06/19/20 1330  BP: 137/81 129/65 128/62 140/71  Pulse: 98 90 92 (!) 102  Resp: (!) 21 16 (!) 21 (!) 26  Temp:      TempSrc:      SpO2: 97% 97% 97% 96%   General:  A&O x 3, NAD, nontoxic, pleasant/cooperative Head/Eye: No conjunctival hemorrhage, no icterus, [place]/AT, No nystagmus ENT:  No icterus,  No thrush, good dentition, no pharyngeal exudate Neck:  No masses, no lymphadenpathy, no bruits CV:  RRR, no rub, no gallop, no S3 Lung:  Poor inspiratory effort.  Bibasilar crackles. No wheeze Abdomen: soft/NT, +BS, nondistended, no peritoneal signs Ext: No cyanosis, No rashes, No petechiae, No lymphangitis, 1+Le  Edema with scattered abrasions  Neuro: CNII-XII intact, strength 4-/5 in bilateral lower extremities, no  dysmetria/  3+LUE and 4/5 RUE strength  Labs on Admission:  Basic Metabolic Panel: Recent Labs  Lab 06/19/20 1141  NA 130*  K 4.0  CL 98  CO2 22  GLUCOSE 177*  BUN 29*  CREATININE 1.54*  CALCIUM 8.7*   Liver Function Tests: Recent Labs  Lab 06/19/20 1141  AST 18  ALT 15  ALKPHOS 83  BILITOT 2.6*  PROT 8.2*  ALBUMIN 3.9   No results for input(s): LIPASE, AMYLASE in the last 168 hours. No results for input(s): AMMONIA in the last 168 hours. CBC: Recent Labs  Lab 06/19/20 1141  WBC 14.8*  NEUTROABS 11.0*  HGB 15.3  HCT 48.2  MCV 83.1  PLT 206   Coagulation Profile: No results for input(s): INR, PROTIME in the last 168 hours. Cardiac Enzymes: Recent Labs  Lab 06/19/20 1141  CKTOTAL 86   BNP: Invalid input(s): POCBNP CBG: No results for input(s): GLUCAP in the last 168 hours. Urine analysis:    Component Value Date/Time   COLORURINE YELLOW 06/19/2020 1058   APPEARANCEUR CLOUDY (A) 06/19/2020 1058   LABSPEC 1.014 06/19/2020 1058   PHURINE 5.0 06/19/2020 1058   GLUCOSEU 150 (A) 06/19/2020 1058  HGBUR LARGE (A) 06/19/2020 1058   BILIRUBINUR NEGATIVE 06/19/2020 [place] 06/19/2020 1058   PROTEINUR 100 (A) 06/19/2020 1058   NITRITE NEGATIVE 06/19/2020 1058   LEUKOCYTESUR LARGE (A) 06/19/2020 1058   Sepsis Labs: @LABRCNTIP (procalcitonin:4,lacticidven:4) ) Recent Results (from the past 240 hour(s))  Respiratory Panel by RT PCR (Flu A&B, Covid) - Nasopharyngeal Swab     Status: None   Collection Time: 06/19/20 12:06 PM   Specimen: Nasopharyngeal Swab  Result Value Ref Range Status   SARS Coronavirus 2 by RT PCR NEGATIVE NEGATIVE Final    Comment: (NOTE) SARS-CoV-2 target nucleic acids are NOT DETECTED.  The SARS-CoV-2 RNA is generally detectable in upper respiratoy specimens during the acute phase of infection. The lowest concentration of SARS-CoV-2 viral copies this assay can detect is 131 copies/mL. A negative result does not  preclude SARS-Cov-2 infection and should not be used as the sole basis for treatment or other patient management decisions. A negative result may occur with  improper specimen collection/handling, submission of specimen other than nasopharyngeal swab, presence of viral mutation(s) within the areas targeted by this assay, and inadequate number of viral copies (<131 copies/mL). A negative result must be combined with clinical observations, patient history, and epidemiological information. The expected result is Negative.  Fact Sheet for Patients:  PinkCheek.be  Fact Sheet for Healthcare Providers:  GravelBags.it  This test is no t yet approved or cleared by the [place] FDA and  has been authorized for detection and/or diagnosis of SARS-CoV-2 by FDA under an Emergency Use Authorization (EUA). This EUA will remain  in effect (meaning this test can be used) for the duration of the COVID-19 declaration under Section 564(b)(1) of the Act, 21 U.S.C. section 360bbb-3(b)(1), unless the authorization is terminated or revoked sooner.     Influenza A by PCR NEGATIVE NEGATIVE Final   Influenza B by PCR NEGATIVE NEGATIVE Final    Comment: (NOTE) The Xpert Xpress SARS-CoV-2/FLU/RSV assay is intended as an aid in  the diagnosis of influenza from Nasopharyngeal swab specimens and  should not be used as a sole basis for treatment. Nasal washings and  aspirates are unacceptable for Xpert Xpress SARS-CoV-2/FLU/RSV  testing.  Fact Sheet for Patients: PinkCheek.be  Fact Sheet for Healthcare Providers: GravelBags.it  This test is not yet approved or cleared by the [place] FDA and  has been authorized for detection and/or diagnosis of SARS-CoV-2 by  FDA under an Emergency Use Authorization (EUA). This EUA will remain  in effect (meaning this test can be used) for the  duration of the  Covid-19 declaration under Section 564(b)(1) of the Act, 21  U.S.C. section 360bbb-3(b)(1), unless the authorization is  terminated or revoked. Performed at [facility], [address] [place], [place] [address]      Radiological Exams on Admission: CT Head Wo Contrast  Result Date: 06/19/2020 CLINICAL DATA:  Dizziness with imbalance for 4 days. Multiple falls. EXAM: CT HEAD WITHOUT CONTRAST TECHNIQUE: Contiguous axial images were obtained from the base of the skull through the vertex without intravenous contrast. COMPARISON:  None. FINDINGS: Brain: A moderately large mixed density subdural hematoma over the right frontoparietal convexity measures up to 2.5 cm in thickness. There is mass effect on the underlying frontal and parietal lobes with sulcal effacement, partial effacement of the right lateral ventricle, and 8 mm of leftward midline shift. No acute infarct is identified. Mild cerebral atrophy is within normal limits for age. Vascular: Calcified atherosclerosis at the skull base.  No hyperdense vessel. Skull: No fracture or suspicious osseous lesion. Sinuses/Orbits: Paranasal sinuses and mastoid air cells are clear. Bilateral cataract extraction. Other: None. IMPRESSION: Moderately large right-sided subdural hematoma with 8 mm of leftward midline shift. Critical Value/emergent results were called by telephone at the time of interpretation on 06/19/2020 at 11:42 am to provider Suella Broad, who verbally acknowledged these results. Electronically Signed   By: Logan Bores M.D.   On: 06/19/2020 11:43   DG Chest Port 1 View  Result Date: 06/19/2020 CLINICAL DATA:  Sudden onset of weakness on Sunday. EXAM: PORTABLE CHEST 1 VIEW COMPARISON:  None. FINDINGS: The cardiac silhouette, mediastinal and hilar contours are within normal limits given the patient's age, AP projection and portable technique. Mild eventration of the right hemidiaphragm. No infiltrates, edema or effusions. No  pneumothorax. The bony thorax is grossly intact. IMPRESSION: No acute cardiopulmonary findings. Electronically Signed   By: Marijo Sanes M.D.   On: 06/19/2020 12:20    EKG: Independently reviewed. Sinus, RBBB    Time spent:70 minutes Code Status:   FULL Family Communication:  Daughter at bedside 11/9 Disposition Plan: expect 2-3 day hospitalization Consults called:neurosurgery DVT Prophylaxis:   SCDs  Orson Eva, DO  [facility] Pager [phone]  If 7PM-7AM, please contact night-coverage [email or web address] Password [email or web address] 06/19/2020, 2:17 PM

## 2020-06-19 NOTE — Anesthesia Procedure Notes (Signed)
Procedure Name: Intubation Date/Time: 06/19/2020 6:08 PM Performed by: Cleda Daub, CRNA Pre-anesthesia Checklist: Patient identified, Emergency Drugs available, Suction available and Patient being monitored Patient Re-evaluated:Patient Re-evaluated prior to induction Oxygen Delivery Method: Circle system utilized Preoxygenation: Pre-oxygenation with 100% oxygen Induction Type: IV induction Ventilation: Mask ventilation without difficulty Laryngoscope Size: Miller and 2 Grade View: Grade I Tube type: Oral Tube size: 7.5 mm Number of attempts: 1 Airway Equipment and Method: Stylet and Oral airway Placement Confirmation: ETT inserted through vocal cords under direct vision,  positive ETCO2 and breath sounds checked- equal and bilateral Secured at: 22 cm Tube secured with: Tape Dental Injury: Teeth and Oropharynx as per pre-operative assessment

## 2020-06-19 NOTE — Op Note (Signed)
06/19/2020  7:12 PM  PATIENT:  Brett Williamson  79 y.o. male  PRE-OPERATIVE DIAGNOSIS:  SUBDURAL HEMATOMA right with left hemiparesis  POST-OPERATIVE DIAGNOSIS:   SUBDURAL HEMATOMA right with left hemiparesis  PROCEDURE:  Procedure(s): CRANIOTOMY HEMATOMA EVACUATION SUBDURAL (Right)  SURGEON:  Surgeon(s) and Role:    Erline Levine, MD - Primary  PHYSICIAN ASSISTANT: Glenford Peers, NP  ASSISTANTS: none   ANESTHESIA:   general  EBL: Minimal  BLOOD ADMINISTERED:none  DRAINS: (#10) Jackson-Pratt drain(s) with closed bulb suction in the subdural space   LOCAL MEDICATIONS USED:  MARCAINE    and LIDOCAINE   SPECIMEN:  No Specimen  DISPOSITION OF SPECIMEN:  N/A  COUNTS:  YES  TOURNIQUET:  * No tourniquets in log *  DICTATION: Patient is 79 year old man who fell and struck his head. He developed left sided weakness.  He was diagnosed with a right SDH with right to left shift.  The patient presents with worsening headache and speech difficulties.  Head CT shows right SDH with mass effect and early shift.It was elected to take patient to surgery for craniotomy for SDH.  Procedure:  Following smooth intubation, patient was placed in left semi-lateral position with blanket roll.  Head was placed on donut head holder and right frontal scalp was shaved and prepped and draped in usual sterile fashion.  Area of planned incision was infiltrated with lidocaine. A curvilinear incision was made and carried through temporalis fascia and muscle to expose calvarium.  Skull flap was elevated exposing subdural hematoma.  Dura was opened and subdural was evacuated.  This was crank oil in color and under pressure.   This was evacuated and a considerable amount of blood was removed. The brain was irrigated.    Hemostasis was assured. The brain came back up after hematoma evacuation.  A #10 JP drain, bone flap was replaced with plates, the fascia and galea were closed with 2-0 vicryl sutures and the skin was re  approximated with staples.  A sterile occlusive dressing was placed.  Patient was extubated and taken to recovery in stable condition having tolerated his operation well.   PLAN OF CARE: Admit to inpatient   PATIENT DISPOSITION:  PACU - hemodynamically stable.   Delay start of Pharmacological VTE agent (>24hrs) due to surgical blood loss or risk of bleeding: yes

## 2020-06-19 NOTE — Consult Note (Signed)
Reason for Consult: SDH Referring Physician: Suella Broad, PA   HPI: Brett Williamson is an 79 y.o. male with a past medical history of HTN and DM2, presented to the ED with progressively worsening generalized weakness. At baseline, the patient lives by himself and is independent with his ADL's, but he has had a history of frequent falls.  In the past week, the patient has fallen with increasing frequency.  Notably, the patient had 4 falls on 06/17/2020 without LOC. At baseline, the patient ambulates with the assistance of a walker.  He initially fell when he tripped going to the bathroom.  He called his family who came to put him back in his lift chair.  He subsequently fell again sliding off of his lift chair.  Each time, he called his family to come put him back in the lift chair.  Regarding his fourth fall, the patient got up to go to the bathroom once again and fell hitting his head.  He denied any LOC.  Once again, the patient's family came to place him back in his chair.  The patient had increasing generalized weakness to the point where he has basically been sitting in his lift chair for the past 48 hours.  Because of his increasing generalized weakness, EMS was contacted to bring the patient to the emergency department. On arrival to the ED, imaging was obtained and revealed a SDH which subsequently led to a neurosurgical consult.   History reviewed. No pertinent past medical history.  History reviewed. No pertinent surgical history.  History reviewed. No pertinent family history.  Social History:  has no history on file for tobacco use, alcohol use, and drug use.  Allergies: Not on File  Medications: I have reviewed the patient's current medications.  Results for orders placed or performed during the hospital encounter of 06/19/20 (from the past 48 hour(s))  Urinalysis, Routine w reflex microscopic Urine, Clean Catch     Status: Abnormal   Collection Time: 06/19/20 10:58 AM  Result Value  Ref Range   Color, Urine YELLOW YELLOW   APPearance CLOUDY (A) CLEAR   Specific Gravity, Urine 1.014 1.005 - 1.030   pH 5.0 5.0 - 8.0   Glucose, UA 150 (A) NEGATIVE mg/dL   Hgb urine dipstick LARGE (A) NEGATIVE   Bilirubin Urine NEGATIVE NEGATIVE   Ketones, ur NEGATIVE NEGATIVE mg/dL   Protein, ur 100 (A) NEGATIVE mg/dL   Nitrite NEGATIVE NEGATIVE   Leukocytes,Ua LARGE (A) NEGATIVE   RBC / HPF >50 (H) 0 - 5 RBC/hpf   WBC, UA >50 (H) 0 - 5 WBC/hpf   Bacteria, UA MANY (A) NONE SEEN   WBC Clumps PRESENT     Comment: Performed at [facility], [address], [place], [place] [address]  Comprehensive metabolic panel     Status: Abnormal   Collection Time: 06/19/20 11:41 AM  Result Value Ref Range   Sodium 130 (L) 135 - 145 mmol/L   Potassium 4.0 3.5 - 5.1 mmol/L   Chloride 98 98 - 111 mmol/L   CO2 22 22 - 32 mmol/L   Glucose, Bld 177 (H) 70 - 99 mg/dL    Comment: Glucose reference range applies only to samples taken after fasting for at least 8 hours.   BUN 29 (H) 8 - 23 mg/dL   Creatinine, Ser 1.54 (H) 0.61 - 1.24 mg/dL   Calcium 8.7 (L) 8.9 - 10.3 mg/dL   Total Protein 8.2 (H) 6.5 - 8.1 g/dL   Albumin 3.9  3.5 - 5.0 g/dL   AST 18 15 - 41 U/L   ALT 15 0 - 44 U/L   Alkaline Phosphatase 83 38 - 126 U/L   Total Bilirubin 2.6 (H) 0.3 - 1.2 mg/dL   GFR, Estimated 45 (L) >60 mL/min    Comment: (NOTE) Calculated using the CKD-EPI Creatinine Equation (2021)    Anion gap 10 5 - 15    Comment: Performed at [facility], [address] [place], [place] [address]  CBC with Differential     Status: Abnormal   Collection Time: 06/19/20 11:41 AM  Result Value Ref Range   WBC 14.8 (H) 4.0 - 10.5 K/uL   RBC 5.80 4.22 - 5.81 MIL/uL   Hemoglobin 15.3 13.0 - 17.0 g/dL   HCT 48.2 39 - 52 %   MCV 83.1 80.0 - 100.0 fL   MCH 26.4 26.0 - 34.0 pg   MCHC 31.7 30.0 - 36.0 g/dL   RDW 15.8 (H) 11.5 - 15.5 %   Platelets 206 150 - 400 K/uL   nRBC 0.0 0.0 - 0.2 %   Neutrophils Relative % 73 %    Neutro Abs 11.0 (H) 1.7 - 7.7 K/uL   Lymphocytes Relative 8 %   Lymphs Abs 1.2 0.7 - 4.0 K/uL   Monocytes Relative 16 %   Monocytes Absolute 2.3 (H) 0.1 - 1.0 K/uL   Eosinophils Relative 1 %   Eosinophils Absolute 0.1 0.0 - 0.5 K/uL   Basophils Relative 1 %   Basophils Absolute 0.1 0.0 - 0.1 K/uL   Immature Granulocytes 1 %   Abs Immature Granulocytes 0.07 0.00 - 0.07 K/uL    Comment: Performed at [facility], [address], [place], [place] [address]  CK     Status: None   Collection Time: 06/19/20 11:41 AM  Result Value Ref Range   Total CK 86 49.0 - 397.0 U/L    Comment: Performed at [facility] [address], [place], [place] [address]  Lactic acid, plasma     Status: None   Collection Time: 06/19/20 11:41 AM  Result Value Ref Range   Lactic Acid, Venous 1.8 0.5 - 1.9 mmol/L    Comment: Performed at [facility], [address] [place], [place] [address]  Respiratory Panel by RT PCR (Flu A&B, Covid) - Nasopharyngeal Swab     Status: None   Collection Time: 06/19/20 12:06 PM   Specimen: Nasopharyngeal Swab  Result Value Ref Range   SARS Coronavirus 2 by RT PCR NEGATIVE NEGATIVE    Comment: (NOTE) SARS-CoV-2 target nucleic acids are NOT DETECTED.  The SARS-CoV-2 RNA is generally detectable in upper respiratoy specimens during the acute phase of infection. The lowest concentration of SARS-CoV-2 viral copies this assay can detect is 131 copies/mL. A negative result does not preclude SARS-Cov-2 infection and should not be used as the sole basis for treatment or other patient management decisions. A negative result may occur with  improper specimen collection/handling, submission of specimen other than nasopharyngeal swab, presence of viral mutation(s) within the areas targeted by this assay, and inadequate number of viral copies (<131 copies/mL). A negative result must be combined with clinical observations, patient history, and epidemiological information. The expected  result is Negative.  Fact Sheet for Patients:  PinkCheek.be  Fact Sheet for Healthcare Providers:  GravelBags.it  This test is no t yet approved or cleared by the [place] FDA and  has been authorized for detection and/or diagnosis of SARS-CoV-2 by FDA under  an Emergency Use Authorization (EUA). This EUA will remain  in effect (meaning this test can be used) for the duration of the COVID-19 declaration under Section 564(b)(1) of the Act, 21 U.S.C. section 360bbb-3(b)(1), unless the authorization is terminated or revoked sooner.     Influenza A by PCR NEGATIVE NEGATIVE   Influenza B by PCR NEGATIVE NEGATIVE    Comment: (NOTE) The Xpert Xpress SARS-CoV-2/FLU/RSV assay is intended as an aid in  the diagnosis of influenza from Nasopharyngeal swab specimens and  should not be used as a sole basis for treatment. Nasal washings and  aspirates are unacceptable for Xpert Xpress SARS-CoV-2/FLU/RSV  testing.  Fact Sheet for Patients: PinkCheek.be  Fact Sheet for Healthcare Providers: GravelBags.it  This test is not yet approved or cleared by the [place] FDA and  has been authorized for detection and/or diagnosis of SARS-CoV-2 by  FDA under an Emergency Use Authorization (EUA). This EUA will remain  in effect (meaning this test can be used) for the duration of the  Covid-19 declaration under Section 564(b)(1) of the Act, 21  U.S.C. section 360bbb-3(b)(1), unless the authorization is  terminated or revoked. Performed at [facility], [address], [place], [place] [address]   Lactic acid, plasma     Status: None   Collection Time: 06/19/20  3:05 PM  Result Value Ref Range   Lactic Acid, Venous 1.4 0.5 - 1.9 mmol/L    Comment: Performed at [facility], [address], [place], [place] [address]    CT Head Wo Contrast  Result Date: 06/19/2020 CLINICAL DATA:   Dizziness with imbalance for 4 days. Multiple falls. EXAM: CT HEAD WITHOUT CONTRAST TECHNIQUE: Contiguous axial images were obtained from the base of the skull through the vertex without intravenous contrast. COMPARISON:  None. FINDINGS: Brain: A moderately large mixed density subdural hematoma over the right frontoparietal convexity measures up to 2.5 cm in thickness. There is mass effect on the underlying frontal and parietal lobes with sulcal effacement, partial effacement of the right lateral ventricle, and 8 mm of leftward midline shift. No acute infarct is identified. Mild cerebral atrophy is within normal limits for age. Vascular: Calcified atherosclerosis at the skull base. No hyperdense vessel. Skull: No fracture or suspicious osseous lesion. Sinuses/Orbits: Paranasal sinuses and mastoid air cells are clear. Bilateral cataract extraction. Other: None. IMPRESSION: Moderately large right-sided subdural hematoma with 8 mm of leftward midline shift. Critical Value/emergent results were called by telephone at the time of interpretation on 06/19/2020 at 11:42 am to provider Suella Broad, who verbally acknowledged these results. Electronically Signed   By: Logan Bores M.D.   On: 06/19/2020 11:43   DG Chest Port 1 View  Result Date: 06/19/2020 CLINICAL DATA:  Sudden onset of weakness on Sunday. EXAM: PORTABLE CHEST 1 VIEW COMPARISON:  None. FINDINGS: The cardiac silhouette, mediastinal and hilar contours are within normal limits given the patient's age, AP projection and portable technique. Mild eventration of the right hemidiaphragm. No infiltrates, edema or effusions. No pneumothorax. The bony thorax is grossly intact. IMPRESSION: No acute cardiopulmonary findings. Electronically Signed   By: Marijo Sanes M.D.   On: 06/19/2020 12:20    Review of Systems: Per HPI  Blood pressure (!) 143/78, pulse (!) 101, temperature 98.1 F (36.7 C), temperature source Oral, resp. rate (!) 23, SpO2 98 %. Physical  Exam Constitutional:      General: He is not in acute distress.    Appearance: Normal appearance.  Eyes:  Extraocular Movements: Extraocular movements intact.     Pupils: Pupils are equal, round, and reactive to light.  Neurological:     Mental Status: He is alert and oriented to person, place, and time.     Cranial Nerves: No cranial nerve deficit.     Sensory: No sensory deficit.     Motor: Weakness present.     Coordination: Coordination normal.     Deep Tendon Reflexes: Reflexes normal.     Comments: RUE weakness  Psychiatric:        Mood and Affect: Mood normal.        Behavior: Behavior normal.        Thought Content: Thought content normal.        Judgment: Judgment normal.     Assessment/Plan: 79 y.o. male with history of multiple recent falls with the last fall causing him to strike his head. He was noted to have generalized weakness and was unable to get out of his chair for an extended period of time. CT head showed a moderately large right-sided subdural hematoma with 8 mm of leftward midline shift. On exam the patient has RUE weakness. The patient was transferred from [facility] to [facility] where he will under go a right craniotomy for evacuation of his subdural hematoma.   Marvis Moeller, DNP, NP-C 06/19/2020, 6:01 PM

## 2020-06-19 NOTE — Anesthesia Preprocedure Evaluation (Addendum)
Anesthesia Evaluation  Patient identified by MRN, date of birth, ID band Patient awake    Reviewed: Allergy & Precautions, NPO status , Patient's Chart, lab work & pertinent test results  History of Anesthesia Complications Negative for: history of anesthetic complications  Airway Mallampati: I  TM Distance: >3 FB Neck ROM: Full    Dental  (+) Edentulous Upper, Edentulous Lower, Dental Advisory Given   Pulmonary neg pulmonary ROS,    Pulmonary exam normal        Cardiovascular hypertension, Pt. on medications Normal cardiovascular exam     Neuro/Psych negative neurological ROS  negative psych ROS   GI/Hepatic negative GI ROS, Neg liver ROS,   Endo/Other  negative endocrine ROS  Renal/GU Renal InsufficiencyRenal disease  negative genitourinary   Musculoskeletal negative musculoskeletal ROS (+)   Abdominal   Peds negative pediatric ROS (+)  Hematology negative hematology ROS (+)   Anesthesia Other Findings   Reproductive/Obstetrics negative OB ROS                            Anesthesia Physical Anesthesia Plan  ASA: IV and emergent  Anesthesia Plan: General   Post-op Pain Management:    Induction: Intravenous and Rapid sequence  PONV Risk Score and Plan: 3 and Ondansetron and Dexamethasone  Airway Management Planned: Oral ETT  Additional Equipment: Arterial line  Intra-op Plan:   Post-operative Plan: Extubation in OR  Informed Consent: I have reviewed the patients History and Physical, chart, labs and discussed the procedure including the risks, benefits and alternatives for the proposed anesthesia with the patient or authorized representative who has indicated his/her understanding and acceptance.     Dental advisory given  Plan Discussed with: Anesthesiologist and CRNA  Anesthesia Plan Comments:         Anesthesia Quick Evaluation

## 2020-06-19 NOTE — ED Notes (Signed)
Report called to [facility] to Tam[facility].  Pts last intake was 0900.

## 2020-06-19 NOTE — Progress Notes (Signed)
Patient is awake, alert, conversant.  He is not in any distress or apparent discomfort.  His left hemiparesis is improved.  Patient is doing well following craniotomy for subdural hematoma.

## 2020-06-19 NOTE — Anesthesia Postprocedure Evaluation (Signed)
Anesthesia Post Note  Patient: Brett Williamson  Procedure(s) Performed: CRANIOTOMY HEMATOMA EVACUATION SUBDURAL (Right Head)     Patient location during evaluation: PACU Anesthesia Type: General Level of consciousness: awake and alert Pain management: pain level controlled Vital Signs Assessment: post-procedure vital signs reviewed and stable Respiratory status: spontaneous breathing, nonlabored ventilation, respiratory function stable and patient connected to nasal cannula oxygen Cardiovascular status: blood pressure returned to baseline and stable Postop Assessment: no apparent nausea or vomiting Anesthetic complications: no   No complications documented.  Last Vitals:  Vitals:   06/19/20 2115 06/19/20 2130  BP: (!) 92/57   Pulse: 88 84  Resp: 20   Temp:  36.4 C  SpO2: 92% 94%    Last Pain:  Vitals:   06/19/20 2130  TempSrc:   PainSc: 0-No pain                 Effie Berkshire

## 2020-06-19 NOTE — Brief Op Note (Signed)
06/19/2020  7:12 PM  PATIENT:  Brett Williamson  79 y.o. male  PRE-OPERATIVE DIAGNOSIS:  SUBDURAL HEMATOMA right with left hemiparesis  POST-OPERATIVE DIAGNOSIS:   SUBDURAL HEMATOMA right with left hemiparesis  PROCEDURE:  Procedure(s): CRANIOTOMY HEMATOMA EVACUATION SUBDURAL (Right)  SURGEON:  Surgeon(s) and Role:    Erline Levine, MD - Primary  PHYSICIAN ASSISTANT: Glenford Peers, NP  ASSISTANTS: none   ANESTHESIA:   general  EBL: Minimal  BLOOD ADMINISTERED:none  DRAINS: (#10) [place]-Pratt drain(s) with closed bulb suction in the subdural space   LOCAL MEDICATIONS USED:  MARCAINE    and LIDOCAINE   SPECIMEN:  No Specimen  DISPOSITION OF SPECIMEN:  N/A  COUNTS:  YES  TOURNIQUET:  * No tourniquets in log *  DICTATION: Patient is 79 year old man who fell and struck his head. He developed left sided weakness.  He was diagnosed with a right SDH with right to left shift.  The patient presents with worsening headache and speech difficulties.  Head CT shows right SDH with mass effect and early shift.It was elected to take patient to surgery for craniotomy for SDH.  Procedure:  Following smooth intubation, patient was placed in left semi-lateral position with blanket roll.  Head was placed on donut head holder and right frontal scalp was shaved and prepped and draped in usual sterile fashion.  Area of planned incision was infiltrated with lidocaine. A curvilinear incision was made and carried through temporalis fascia and muscle to expose calvarium.  Skull flap was elevated exposing subdural hematoma.  Dura was opened and subdural was evacuated.  This was crank oil in color and under pressure.   This was evacuated and a considerable amount of blood was removed. The brain was irrigated.    Hemostasis was assured. The brain came back up after hematoma evacuation.  A #10 JP drain, bone flap was replaced with plates, the fascia and galea were closed with 2-0 vicryl sutures and the skin was re  approximated with staples.  A sterile occlusive dressing was placed.  Patient was extubated and taken to recovery in stable condition having tolerated his operation well.   PLAN OF CARE: Admit to inpatient   PATIENT DISPOSITION:  PACU - hemodynamically stable.   Delay start of Pharmacological VTE agent (>24hrs) due to surgical blood loss or risk of bleeding: yes

## 2020-06-19 NOTE — ED Notes (Signed)
Report given to [facility]. 

## 2020-06-20 ENCOUNTER — Inpatient Hospital Stay (HOSPITAL_COMMUNITY): Payer: Medicare Other

## 2020-06-20 DIAGNOSIS — R011 Cardiac murmur, unspecified: Secondary | ICD-10-CM

## 2020-06-20 DIAGNOSIS — I340 Nonrheumatic mitral (valve) insufficiency: Secondary | ICD-10-CM

## 2020-06-20 DIAGNOSIS — I352 Nonrheumatic aortic (valve) stenosis with insufficiency: Secondary | ICD-10-CM

## 2020-06-20 LAB — ECHOCARDIOGRAM COMPLETE
AR max vel: 0.6 cm2
AV Area VTI: 0.57 cm2
AV Area mean vel: 0.55 cm2
AV Mean grad: 51.5 mmHg
AV Peak grad: 84.6 mmHg
Ao pk vel: 4.6 m/s
Area-P 1/2: 2.87 cm2
Height: 71 in
S' Lateral: 3.7 cm
Weight: 4257.52 oz

## 2020-06-20 LAB — COMPREHENSIVE METABOLIC PANEL
ALT: 11 U/L (ref 0–44)
AST: 14 U/L — ABNORMAL LOW (ref 15–41)
Albumin: 2.9 g/dL — ABNORMAL LOW (ref 3.5–5.0)
Alkaline Phosphatase: 57 U/L (ref 38–126)
Anion gap: 9 (ref 5–15)
BUN: 28 mg/dL — ABNORMAL HIGH (ref 8–23)
CO2: 18 mmol/L — ABNORMAL LOW (ref 22–32)
Calcium: 7.8 mg/dL — ABNORMAL LOW (ref 8.9–10.3)
Chloride: 107 mmol/L (ref 98–111)
Creatinine, Ser: 1.55 mg/dL — ABNORMAL HIGH (ref 0.61–1.24)
GFR, Estimated: 45 mL/min — ABNORMAL LOW (ref >60)
Glucose, Bld: 148 mg/dL — ABNORMAL HIGH (ref 70–99)
Potassium: 4 mmol/L (ref 3.5–5.1)
Sodium: 134 mmol/L — ABNORMAL LOW (ref 135–145)
Total Bilirubin: 1.7 mg/dL — ABNORMAL HIGH (ref 0.3–1.2)
Total Protein: 6.2 g/dL — ABNORMAL LOW (ref 6.5–8.1)

## 2020-06-20 LAB — CBC
HCT: 37.6 % — ABNORMAL LOW (ref 39.0–52.0)
Hemoglobin: 12.1 g/dL — ABNORMAL LOW (ref 13.0–17.0)
MCH: 26.9 pg (ref 26.0–34.0)
MCHC: 32.2 g/dL (ref 30.0–36.0)
MCV: 83.7 fL (ref 80.0–100.0)
Platelets: 152 10*3/uL (ref 150–400)
RBC: 4.49 MIL/uL (ref 4.22–5.81)
RDW: 15.1 % (ref 11.5–15.5)
WBC: 11.5 10*3/uL — ABNORMAL HIGH (ref 4.0–10.5)
nRBC: 0 % (ref 0.0–0.2)

## 2020-06-20 LAB — GLUCOSE, CAPILLARY
Glucose-Capillary: 115 mg/dL — ABNORMAL HIGH (ref 70–99)
Glucose-Capillary: 137 mg/dL — ABNORMAL HIGH (ref 70–99)
Glucose-Capillary: 155 mg/dL — ABNORMAL HIGH (ref 70–99)
Glucose-Capillary: 157 mg/dL — ABNORMAL HIGH (ref 70–99)
Glucose-Capillary: 161 mg/dL — ABNORMAL HIGH (ref 70–99)

## 2020-06-20 LAB — VITAMIN B12: Vitamin B-12: 90 pg/mL — ABNORMAL LOW (ref 180–914)

## 2020-06-20 LAB — MRSA PCR SCREENING: MRSA by PCR: NEGATIVE

## 2020-06-20 LAB — HEMOGLOBIN A1C
Hgb A1c MFr Bld: 6.6 % — ABNORMAL HIGH (ref 4.8–5.6)
Mean Plasma Glucose: 142.72 mg/dL

## 2020-06-20 LAB — T4, FREE: Free T4: 1.03 ng/dL (ref 0.61–1.12)

## 2020-06-20 LAB — FOLATE: Folate: 3.2 ng/mL — ABNORMAL LOW (ref >5.9)

## 2020-06-20 LAB — TSH: TSH: 0.333 u[IU]/mL — ABNORMAL LOW (ref 0.350–4.500)

## 2020-06-20 MED ORDER — GERHARDT'S BUTT CREAM
TOPICAL_CREAM | Freq: Three times a day (TID) | CUTANEOUS | Status: DC
  Administered 2020-06-20 – 2020-06-25 (×4): 1 via TOPICAL
  Filled 2020-06-20 (×3): qty 1

## 2020-06-20 NOTE — Progress Notes (Signed)
  Echocardiogram 2D Echocardiogram has been performed.  Brett Williamson 06/20/2020, 10:54 AM

## 2020-06-20 NOTE — Evaluation (Signed)
Physical Therapy Evaluation Patient Details Name: Brett Williamson MRN: [id number] DOB: 1941/03/15 Today's Date: 06/20/2020   History of Present Illness  Brett Williamson is a 79 y.o. male with PMH of HTN, DM II, presenting with worsening generalized weakness to the point where he is unable to get out of his chair. Pt with history of several with most recently being 4 falls on 11/7, including a slip out of his lift chair. CT brain showed moderately large SDH with leftwards midline shift. Pt underwent R crani for evacuation of SDH on 11/09.    Clinical Impression  Pt admitted with above. Pt alert and oriented x4, pleasant and eager to participate. Pt was living alone and using a rollator for ambulation. Pt was driving and doing his grocery shopping. Pt with noted history of falls. Pt currently requiring modAx2 for oob mobility and with limited R foot clearance height during std pvt requiring modA to help weight shift and advance R LE. Recommending CIR upon d/c to maximize functional recovery. Acute PT to cont to follow.    Follow Up Recommendations CIR    Equipment Recommendations   (TBD)    Recommendations for Other Services Rehab consult     Precautions / Restrictions Precautions Precautions: Fall Precaution Comments: R a-line in wrist Restrictions Weight Bearing Restrictions: No      Mobility  Bed Mobility Overal bed mobility: Needs Assistance Bed Mobility: Supine to Sit     Supine to sit: Mod assist     General bed mobility comments: HOB elevated, pt able to move LEs off EOB, used L UE to pull up on PT and R UE used bed rail, increased time, labored effort, most likely due to body habitus    Transfers Overall transfer level: Needs assistance Equipment used: 2 person hand held assist Transfers: Sit to/from [facility] Sit to Stand: Mod assist;+2 physical assistance Stand pivot transfers: Mod assist;+2 physical assistance       General transfer comment:  modAx2 to power up, bed elevated, 3 trials prior to completion of standing, pt with wide base of support, limited R foot clearance requiring asisst for weight-shifting  Ambulation/Gait             General Gait Details: limited to stand pvt to chair due to weakness  Stairs            Wheelchair Mobility    Modified Rankin (Stroke Patients Only) Modified Rankin (Stroke Patients Only) Pre-Morbid Rankin Score: Slight disability Modified Rankin: Moderately severe disability     Balance Overall balance assessment: Needs assistance Sitting-balance support: Feet supported;No upper extremity supported Sitting balance-Leahy Scale: Fair Sitting balance - Comments: unable to don socks   Standing balance support: Bilateral upper extremity supported;During functional activity Standing balance-Leahy Scale: Zero Standing balance comment: dependent on physical assist                             Pertinent Vitals/Pain Pain Assessment: No/denies pain    Home Living Family/patient expects to be discharged to:: Inpatient rehab Living Arrangements: Alone Available Help at Discharge:  (grandaughter lives nearby but only comes over when called; girlfriend lives 2 minutes away but doesnt come over everyday) Type of Home: Apartment       Home Layout: One level (handicap apartment) Home Equipment: Grab bars - tub/shower;Grab bars - toilet (uses rollator around house; uses 3 wheel walker in the community)      Prior Function Level of  Independence: Independent         Comments: did all the cooking and cleaning     Hand Dominance        Extremity/Trunk Assessment   Upper Extremity Assessment Upper Extremity Assessment: RUE deficits/detail;LUE deficits/detail RUE Deficits / Details: pt with torn rotator cuff, limited shld flex, 3-/5 LUE Deficits / Details: pt with torn rotator cuff and repair however limited ROM above head and 3+/5 strength    Lower Extremity  Assessment Lower Extremity Assessment: Generalized weakness       Communication   Communication: No difficulties  Cognition Arousal/Alertness: Awake/alert Behavior During Therapy: WFL for tasks assessed/performed Overall Cognitive Status: Within Functional Limits for tasks assessed                                 General Comments: no family present however is oriented and appropriate, able to follow commands, pt with decreased safety awareness as pt fell 4 times in one day and each time he called a family member to help him up, neither pt or family with decreased insight of deficits      General Comments General comments (skin integrity, edema, etc.): pt with bilat lower leg discoloration from DM II    Exercises     Assessment/Plan    PT Assessment Patient needs continued PT services  PT Problem List Decreased strength;Decreased range of motion;Decreased activity tolerance;Decreased balance;Decreased mobility       PT Treatment Interventions DME instruction;Gait training;Stair training;Functional mobility training;Therapeutic activities;Therapeutic exercise;Balance training;Neuromuscular re-education    PT Goals (Current goals can be found in the Care Plan section)  Acute Rehab PT Goals Patient Stated Goal: get stronger    Frequency Min 4X/week   Barriers to discharge Decreased caregiver support lives alone    Co-evaluation               AM-PAC PT "6 Clicks" Mobility  Outcome Measure Help needed turning from your back to your side while in a flat bed without using bedrails?: A Lot Help needed moving from lying on your back to sitting on the side of a flat bed without using bedrails?: A Lot Help needed moving to and from a bed to a chair (including a wheelchair)?: A Lot Help needed standing up from a chair using your arms (e.g., wheelchair or bedside chair)?: A Lot Help needed to walk in hospital room?: A Lot Help needed climbing 3-5 steps with a  railing? : Total 6 Click Score: 11    End of Session Equipment Utilized During Treatment: Gait belt Activity Tolerance: Patient tolerated treatment well Patient left: in chair;with call bell/phone within reach;with chair alarm set Nurse Communication: Mobility status (SpO2 >94% on RA) PT Visit Diagnosis: Unsteadiness on feet (R26.81);Muscle weakness (generalized) (M62.81);Difficulty in walking, not elsewhere classified (R26.2)    Time: 7893-8101 PT Time Calculation (min) (ACUTE ONLY): 29 min   Charges:   PT Evaluation $PT Eval Moderate Complexity: 1 Mod PT Treatments $Therapeutic Activity: 8-22 mins        Kittie Plater, PT, DPT Acute Rehabilitation Services Pager #: [phone] Office #: [phone]   Berline Lopes 06/20/2020, 1:10 PM

## 2020-06-20 NOTE — Progress Notes (Signed)
Subjective: Patient reports that he is doing well and had a good night. No acute events reported overnight.   Objective: Vital signs in last 24 hours: Temp:  [96.7 F (35.9 C)-100.2 F (37.9 C)] 100.2 F (37.9 C) (11/10 0800) Pulse Rate:  [59-106] 66 (11/10 0700) Resp:  [12-27] 21 (11/10 0700) BP: (91-147)/(52-99) 98/52 (11/10 0700) SpO2:  [92 %-100 %] 99 % (11/10 0700) Arterial Line BP: (103-146)/(44-67) 116/50 (11/10 0700) Weight:  [120.7 kg] 120.7 kg (11/10 0600)  Intake/Output from previous day: 11/09 0701 - 11/10 0700 In: 2511.1 [I.V.:1111.1; IV Piggyback:1400] Out: 545 [Urine:500; Drains:35; Blood:10] Intake/Output this shift: No intake/output data recorded.  Physical Exam:   Patient is awake, A/O X 4, and conversant. MAEW with good power. His left hemiparesis has improved. Dressing is CDI. JP drain in place with about 5 ml of sanguinous output over night.    Lab Results: Recent Labs    06/19/20 1141 06/20/20 0454  WBC 14.8* 11.5*  HGB 15.3 12.1*  HCT 48.2 37.6*  PLT 206 152   BMET Recent Labs    06/19/20 1141 06/20/20 0454  NA 130* 134*  K 4.0 4.0  CL 98 107  CO2 22 18*  GLUCOSE 177* 148*  BUN 29* 28*  CREATININE 1.54* 1.55*  CALCIUM 8.7* 7.8*    Studies/Results: CT Head Wo Contrast  Result Date: 06/19/2020 CLINICAL DATA:  Dizziness with imbalance for 4 days. Multiple falls. EXAM: CT HEAD WITHOUT CONTRAST TECHNIQUE: Contiguous axial images were obtained from the base of the skull through the vertex without intravenous contrast. COMPARISON:  None. FINDINGS: Brain: A moderately large mixed density subdural hematoma over the right frontoparietal convexity measures up to 2.5 cm in thickness. There is mass effect on the underlying frontal and parietal lobes with sulcal effacement, partial effacement of the right lateral ventricle, and 8 mm of leftward midline shift. No acute infarct is identified. Mild cerebral atrophy is within normal limits for age.  Vascular: Calcified atherosclerosis at the skull base. No hyperdense vessel. Skull: No fracture or suspicious osseous lesion. Sinuses/Orbits: Paranasal sinuses and mastoid air cells are clear. Bilateral cataract extraction. Other: None. IMPRESSION: Moderately large right-sided subdural hematoma with 8 mm of leftward midline shift. Critical Value/emergent results were called by telephone at the time of interpretation on 06/19/2020 at 11:42 am to provider Suella Broad, who verbally acknowledged these results. Electronically Signed   By: Logan Bores M.D.   On: 06/19/2020 11:43   US RENAL  Result Date: 06/19/2020 CLINICAL DATA:  CKD EXAM: RENAL / URINARY TRACT ULTRASOUND COMPLETE COMPARISON:  None. FINDINGS: Right Kidney: Renal measurements: 11.4 x 5.4 x 5.1 cm = volume: 164 mL. Cortical echogenicity is within normal limits. Mild right hydronephrosis with central calyceal dilatation. No shadowing calculus or concerning renal mass. Left Kidney: Renal measurements: 10.3 x 5.5 x 4.0 cm = volume: 117.4 mL. Cortical echogenicity is within normal limits. 4.9 x 1.5 x 2.1 cm hypoechoic lesion arising from the upper pole left kidney appears to be a solid lesion with some internal color flow. No other concerning renal mass. No urolithiasis or hydronephrosis. Bladder: Bandage in the pelvic area significantly limiting evaluation of the low midline pelvis without clear visualization of the urinary. Other: None. IMPRESSION: 1. Mild right hydronephrosis. 2. 4.9 cm hypoechoic lesion arising from the upper pole left kidney. Could reflect a solid mass. Recommend further evaluation with renal mass protocol MRI on an outpatient basis if patient is able to tolerate contrast media. 3. Nonvisualization of the  urinary bladder, partially due to bandaging overlying the low anterior pelvis. Electronically Signed   By: Lovena Le M.D.   On: 06/19/2020 22:54   DG Chest Port 1 View  Result Date: 06/19/2020 CLINICAL DATA:  Sudden onset of  weakness on Sunday. EXAM: PORTABLE CHEST 1 VIEW COMPARISON:  None. FINDINGS: The cardiac silhouette, mediastinal and hilar contours are within normal limits given the patient's age, AP projection and portable technique. Mild eventration of the right hemidiaphragm. No infiltrates, edema or effusions. No pneumothorax. The bony thorax is grossly intact. IMPRESSION: No acute cardiopulmonary findings. Electronically Signed   By: Marijo Sanes M.D.   On: 06/19/2020 12:20    Assessment/Plan: Patient is post op day 1 s/p right frontal craniotomy for SDH evacuation. Left hemiparesis is improving and he is doing well. MRI brain tomorrow morning. Will leave drain in today.   LOS: 1 day     Marvis Moeller, DNP, NP-C 06/20/2020, 8:36 AM

## 2020-06-20 NOTE — Progress Notes (Signed)
Rehab Admissions Coordinator Note:  Per PT recommendation, this patient was screened by Raechel Ache for appropriateness for an Inpatient Acute Rehab Consult.  At this time, we are recommending Inpatient Rehab consult. AC will contact MD to request order.   Raechel Ache 06/20/2020, 5:05 PM  I can be reached at [phone].

## 2020-06-20 NOTE — Consult Note (Signed)
WOC Nurse Consult Note: Patient receiving care in [place]. Assisted with turning by primary RN and nursing student. Reason for Consult: multiple wounds, arrived from home with Wound type: MASD-IAD to scrotum, buttocks, perineum.  MASD-ITD to abdominal pannus. Skin tear to LFA. Abrasion to LLE, posterior leg. Skin tear to LFA. Pressure Injury POA: Yes/No/NA Measurement: LLE posterior wound measures 3.8 cm x 2.2 cm and is pink. 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  Staff have started with the antifungal powder to the area, which I have ordered to continue. The MASD-IAD to the buttocks, perineum, and scrotum is from urinary incontinence.  I have ordered TID application of Gerhardt's butt cream. Drainage (amount, consistency, odor) none Periwound: intact Dressing procedure/placement/frequency:  Place a Xeroform gauze Kellie Simmering [phone]) over skin tears, secure with kerlex whenever possible, avoid tape on the skin.  Liberally moisten dressing with saline to remove if adhered to wound. Monitor the wound area(s) for worsening of condition such as: Signs/symptoms of infection,  Increase in size,  Development of or worsening of odor, Development of pain, or increased pain at the affected locations.  Notify the medical team if any of these develop.  Thank you for the consult.  Discussed plan of care with the patient and bedside nurse.  [facility] nurse will not follow at this time.  Please re-consult the [facility] team if needed.  Val Riles, RN, MSN, CWOCN, CNS-BC, pager [phone]

## 2020-06-21 ENCOUNTER — Inpatient Hospital Stay (HOSPITAL_COMMUNITY): Payer: Medicare Other

## 2020-06-21 ENCOUNTER — Encounter (HOSPITAL_COMMUNITY): Payer: Self-pay | Admitting: Neurosurgery

## 2020-06-21 DIAGNOSIS — S065X9A Traumatic subdural hemorrhage with loss of consciousness of unspecified duration, initial encounter: Principal | ICD-10-CM

## 2020-06-21 DIAGNOSIS — L8990 Pressure ulcer of unspecified site, unspecified stage: Secondary | ICD-10-CM | POA: Insufficient documentation

## 2020-06-21 LAB — GLUCOSE, CAPILLARY
Glucose-Capillary: 152 mg/dL — ABNORMAL HIGH (ref 70–99)
Glucose-Capillary: 150 mg/dL — ABNORMAL HIGH (ref 70–99)
Glucose-Capillary: 172 mg/dL — ABNORMAL HIGH (ref 70–99)
Glucose-Capillary: 150 mg/dL — ABNORMAL HIGH (ref 70–99)

## 2020-06-21 MED ORDER — PANTOPRAZOLE SODIUM 40 MG PO TBEC
40.0000 mg | DELAYED_RELEASE_TABLET | Freq: Every day | ORAL | Status: DC
  Administered 2020-06-21 – 2020-06-24 (×4): 40 mg via ORAL
  Filled 2020-06-21 (×4): qty 1

## 2020-06-21 NOTE — Consult Note (Signed)
Physical Medicine and Rehabilitation Consult Reason for Consult: Altered mental status with generalized weakness Referring Physician: Dr. Vertell Limber   HPI: Brett Williamson is a 79 y.o. right-handed male with history of hypertension, diabetes mellitus.  Per chart review patient lives alone reportedly independent prior to admission using a Rollator.  Apartment with one level apartment.  He has a granddaughter and a girlfriend in the area that check on him if needed.  Presented 06/19/2020 with report frequent falls as well as generalized weakness.  No reported loss of consciousness.  CT of the head showed moderately large right-sided subdural hematoma with 8 mm leftward midline shift.  Admission chemistry sodium 130 glucose 177 BUN 29 creatinine 1.54, CK 86, hemoglobin 15.3 WBC 14,800, urine culture greater 100,000 gram-negative rods and placed on Rocephin, lactic acid 1.4.  Patient underwent craniotomy hematoma evacuation on 06/19/2020 per Dr. Vertell Limber.  MRI follow-up 06/21/2020 showed interval decompression of subdural hematoma with significantly improved mass-effect.  Midline shift decreased to 3 mm.  Echocardiogram 06/21/2020 ejection fraction 55 to 60% no wall motion abnormalities grade 2 diastolic dysfunction.  Maintained on Keppra for seizure prophylaxis.  Wound care nurse follow-up for multiple wounds scrotum buttocks perineum with wound care as directed.  Therapy evaluations completed with recommendations of physical medicine rehab consult.   Daughter, who's only child, mentioned that if he needs 24 hours/assist, they "will find a way".  Pt reports when he first fell, felt off balance, but not weak".    Review of Systems  Constitutional: Positive for malaise/fatigue. Negative for chills, fever and weight loss.  HENT: Negative for hearing loss.   Eyes: Negative for blurred vision and double vision.  Respiratory: Negative for cough and shortness of breath.   Cardiovascular: Negative for chest pain,  palpitations and leg swelling.  Gastrointestinal: Positive for constipation. Negative for heartburn, nausea and vomiting.  Genitourinary: Positive for urgency. Negative for dysuria, flank pain and hematuria.  Musculoskeletal: Positive for falls and myalgias.  Skin: Negative for rash.  Neurological: Positive for weakness and headaches. Negative for seizures.  All other systems reviewed and are negative.  History reviewed. No pertinent past medical history. Past Surgical History:  Procedure Laterality Date  . CRANIOTOMY Right 06/19/2020   Procedure: CRANIOTOMY HEMATOMA EVACUATION SUBDURAL;  Surgeon: Erline Levine, MD;  Location: [facility];  Service: Neurosurgery;  Laterality: Right;   History reviewed. No pertinent family history. Social History:  has no history on file for tobacco use, alcohol use, and drug use. Allergies: No Known Allergies Medications Prior to Admission  Medication Sig Dispense Refill  . benazepril (LOTENSIN) 20 MG tablet Take 20 mg by mouth daily.    . hydrochlorothiazide (HYDRODIURIL) 25 MG tablet Take 25 mg by mouth daily.    Marland Kitchen ibuprofen (ADVIL) 200 MG tablet Take 200 mg by mouth every 4 (four) hours as needed for mild pain.      Home: Home Living Family/patient expects to be discharged to:: Inpatient rehab Living Arrangements: Alone Available Help at Discharge:  (grandaughter lives nearby but only comes over when called; girlfriend lives 2 minutes away but doesnt come over everyday) Type of Home: [place]: One level (handicap apartment) Bathroom Shower/Tub: Chiropodist: Handicapped height Home Equipment: Grab bars - tub/shower, Grab bars - toilet (uses rollator around house; uses 3 wheel walker in the community)  Functional History: Prior Function Level of Independence: Independent Comments: did all the cooking and cleaning Functional Status:  Mobility: Bed Mobility Overal bed mobility:  Needs Assistance Bed Mobility: Supine  to Sit Supine to sit: Mod assist General bed mobility comments: HOB elevated, pt able to move LEs off EOB, used L UE to pull up on PT and R UE used bed rail, increased time, labored effort, most likely due to body habitus Transfers Overall transfer level: Needs assistance Equipment used: 2 person hand held assist Transfers: Sit to/from Stand, Stand Pivot Transfers Sit to Stand: Mod assist, +2 physical assistance Stand pivot transfers: Mod assist, +2 physical assistance General transfer comment: modAx2 to power up, bed elevated, 3 trials prior to completion of standing, pt with wide base of support, limited R foot clearance requiring asisst for weight-shifting Ambulation/Gait General Gait Details: limited to stand pvt to chair due to weakness    ADL:    Cognition: Cognition Overall Cognitive Status: Within Functional Limits for tasks assessed Orientation Level: Oriented X4 Cognition Arousal/Alertness: Awake/alert Behavior During Therapy: WFL for tasks assessed/performed Overall Cognitive Status: Within Functional Limits for tasks assessed General Comments: no family present however is oriented and appropriate, able to follow commands, pt with decreased safety awareness as pt fell 4 times in one day and each time he called a family member to help him up, neither pt or family with decreased insight of deficits  Blood pressure 117/73, pulse 92, temperature 99.4 F (37.4 C), temperature source Oral, resp. rate (!) 24, height 5\' 11"  (1.803 m), weight 120.7 kg, SpO2 98 %. Physical Exam Vitals and nursing note reviewed. Exam conducted with a chaperone present.  Constitutional:      Comments: Daughter at bedside, pt in bedside chair, mainly appropriate, obese, NAD  HENT:     Head: Normocephalic.     Comments: Craniotomy site clean and dry R frontal- iodine stain on face/head Smile equal    Right Ear: External ear normal.     Left Ear: External ear normal.     Nose: Nose normal. No  congestion.     Mouth/Throat:     Mouth: Mucous membranes are moist.     Pharynx: Oropharynx is clear. No oropharyngeal exudate.  Eyes:     General:        Right eye: No discharge.        Left eye: No discharge.     Extraocular Movements: Extraocular movements intact.  Cardiovascular:     Rate and Rhythm: Normal rate and regular rhythm.     Heart sounds: Normal heart sounds. No murmur heard.  No gallop.   Pulmonary:     Comments: Coarse, but good air movement B/L Abdominal:     Comments: Obese, protuberant, NT, distended?; hypoactive  Musculoskeletal:     Cervical back: Normal range of motion. No rigidity.     Comments: 4-/5 in UEs- and equal- biceps, triceps, WE and grip 4-/5 in LEs- HF, KE, and PF- DF 2/5 B/L- and has decreased ROM of DF B/L as well Also has large scar on R 2nd digit- swollen hands, stiff, "chronic".   Skin:    Comments: Venous stasis changes on legs with peeling red skin and many skin tears Also has MASD in pannus, groin, between thighs L foot slightly swollen  Neurological:     Comments: Patient is alert no acute distress.  Oriented to person place and time.  Follows simple commands.  Decreased sensation to light touch in stocking/glove distribution- normal for him     Results for orders placed or performed during the hospital encounter of 06/19/20 (from the past 24 hour(s))  Glucose,  capillary     Status: Abnormal   Collection Time: 06/20/20 11:43 AM  Result Value Ref Range   Glucose-Capillary 115 (H) 70 - 99 mg/dL  Glucose, capillary     Status: Abnormal   Collection Time: 06/20/20  4:30 PM  Result Value Ref Range   Glucose-Capillary 155 (H) 70 - 99 mg/dL  Glucose, capillary     Status: Abnormal   Collection Time: 06/20/20 10:27 PM  Result Value Ref Range   Glucose-Capillary 157 (H) 70 - 99 mg/dL  Glucose, capillary     Status: Abnormal   Collection Time: 06/21/20  8:24 AM  Result Value Ref Range   Glucose-Capillary 152 (H) 70 - 99 mg/dL   MR  BRAIN WO CONTRAST  Result Date: 06/21/2020 CLINICAL DATA:  Subdural hematoma with decompression. Worsening generalized weakness. EXAM: MRI HEAD WITHOUT CONTRAST TECHNIQUE: Multiplanar, multiecho pulse sequences of the brain and surrounding structures were obtained without intravenous contrast. COMPARISON:  Head CT from 2 days ago FINDINGS: Brain: Interval decompression of subdural hematoma on the right, with drain in place. The collection and mass effect has diminished significantly. Maximal collection thickness is 11 mm today, as measured on coronal T2 weighted imaging. Midline shift has decreased to 3 mm (previously 7 mm). No acute infarct, hydrocephalus, or masslike finding. Vascular: Preserved flow voids Skull and upper cervical spine: Normal marrow signal Sinuses/Orbits: Negative IMPRESSION: 1. Interval decompression of the subdural hematoma with significantly improved mass effect. Midline shift is decreased to 3 mm. 2. The brain itself appears normal for age.  No acute infarct. Electronically Signed   By: Monte Fantasia M.D.   On: 06/21/2020 06:53   US RENAL  Result Date: 06/19/2020 CLINICAL DATA:  CKD EXAM: RENAL / URINARY TRACT ULTRASOUND COMPLETE COMPARISON:  None. FINDINGS: Right Kidney: Renal measurements: 11.4 x 5.4 x 5.1 cm = volume: 164 mL. Cortical echogenicity is within normal limits. Mild right hydronephrosis with central calyceal dilatation. No shadowing calculus or concerning renal mass. Left Kidney: Renal measurements: 10.3 x 5.5 x 4.0 cm = volume: 117.4 mL. Cortical echogenicity is within normal limits. 4.9 x 1.5 x 2.1 cm hypoechoic lesion arising from the upper pole left kidney appears to be a solid lesion with some internal color flow. No other concerning renal mass. No urolithiasis or hydronephrosis. Bladder: Bandage in the pelvic area significantly limiting evaluation of the low midline pelvis without clear visualization of the urinary. Other: None. IMPRESSION: 1. Mild right  hydronephrosis. 2. 4.9 cm hypoechoic lesion arising from the upper pole left kidney. Could reflect a solid mass. Recommend further evaluation with renal mass protocol MRI on an outpatient basis if patient is able to tolerate contrast media. 3. Nonvisualization of the urinary bladder, partially due to bandaging overlying the low anterior pelvis. Electronically Signed   By: Lovena Le M.D.   On: 06/19/2020 22:54   DG Chest Port 1 View  Result Date: 06/19/2020 CLINICAL DATA:  Sudden onset of weakness on Sunday. EXAM: PORTABLE CHEST 1 VIEW COMPARISON:  None. FINDINGS: The cardiac silhouette, mediastinal and hilar contours are within normal limits given the patient's age, AP projection and portable technique. Mild eventration of the right hemidiaphragm. No infiltrates, edema or effusions. No pneumothorax. The bony thorax is grossly intact. IMPRESSION: No acute cardiopulmonary findings. Electronically Signed   By: Marijo Sanes M.D.   On: 06/19/2020 12:20   ECHOCARDIOGRAM COMPLETE  Result Date: 06/20/2020    ECHOCARDIOGRAM REPORT   Patient Name:   Khy Dain Date of Exam:  06/20/2020 Medical Rec #:  [id number]    Height:       71.0 in Accession #:    [id number]   Weight:       266.1 lb Date of Birth:  03-02-1941     BSA:          2.381 m Patient Age:    42 years     BP:           81/53 mmHg Patient Gender: M            HR:           62 bpm. Exam Location:  Inpatient Procedure: 2D Echo Indications:    murmur 785.2  History:        Patient has no prior history of Echocardiogram examinations.                 Aortic Valve Disease; Risk Factors:Hypertension.  Sonographer:    Johny Chess Referring Phys: [phone] DAVID TAT IMPRESSIONS  1. Severe aortic stenosis.  2. Left ventricular ejection fraction, by estimation, is 55 to 60%. The left ventricle has normal function. The left ventricle has no regional wall motion abnormalities. There is moderate left ventricular hypertrophy. Left ventricular diastolic parameters  are consistent with Grade II diastolic dysfunction (pseudonormalization).  3. Right ventricular systolic function is normal. The right ventricular size is normal.  4. Left atrial size was mildly dilated.  5. The mitral valve is normal in structure. Mild mitral valve regurgitation. No evidence of mitral stenosis. Severe mitral annular calcification.  6. The aortic valve is calcified. There is severe calcifcation of the aortic valve. There is severe thickening of the aortic valve. Aortic valve regurgitation is mild to moderate. Severe aortic valve stenosis. Aortic valve area, by VTI measures 0.57 cm. Aortic valve mean gradient measures 51.5 mmHg. Aortic valve Vmax measures 4.60 m/s.  7. Aortic dilatation noted. There is mild dilatation of the ascending aorta, measuring 38 mm.  8. The inferior vena cava is normal in size with greater than 50% respiratory variability, suggesting right atrial pressure of 3 mmHg. Comparison(s): No prior Echocardiogram. FINDINGS  Left Ventricle: Left ventricular ejection fraction, by estimation, is 55 to 60%. The left ventricle has normal function. The left ventricle has no regional wall motion abnormalities. The left ventricular internal cavity size was normal in size. There is  moderate left ventricular hypertrophy. Left ventricular diastolic parameters are consistent with Grade II diastolic dysfunction (pseudonormalization). Right Ventricle: The right ventricular size is normal. No increase in right ventricular wall thickness. Right ventricular systolic function is normal. Left Atrium: Left atrial size was mildly dilated. Right Atrium: Right atrial size was normal in size. Pericardium: There is no evidence of pericardial effusion. Mitral Valve: The mitral valve is normal in structure. Severe mitral annular calcification. Mild mitral valve regurgitation. No evidence of mitral valve stenosis. Tricuspid Valve: The tricuspid valve is normal in structure. Tricuspid valve regurgitation is  not demonstrated. No evidence of tricuspid stenosis. Aortic Valve: The aortic valve is calcified. There is severe calcifcation of the aortic valve. There is severe thickening of the aortic valve. Aortic valve regurgitation is mild to moderate. Severe aortic stenosis is present. Aortic valve mean gradient measures 51.5 mmHg. Aortic valve peak gradient measures 84.6 mmHg. Aortic valve area, by VTI measures 0.57 cm. Pulmonic Valve: The pulmonic valve was normal in structure. Pulmonic valve regurgitation is mild. No evidence of pulmonic stenosis. Aorta: Aortic dilatation noted. There is mild dilatation of the ascending aorta,  measuring 38 mm. Venous: The inferior vena cava is normal in size with greater than 50% respiratory variability, suggesting right atrial pressure of 3 mmHg. IAS/Shunts: No atrial level shunt detected by color flow Doppler.  LEFT VENTRICLE PLAX 2D LVIDd:         5.20 cm  Diastology LVIDs:         3.70 cm  LV e' medial:    5.66 cm/s LV PW:         1.50 cm  LV E/e' medial:  25.4 LV IVS:        1.40 cm  LV e' lateral:   5.98 cm/s LVOT diam:     2.20 cm  LV E/e' lateral: 24.1 LV SV:         67 LV SV Index:   28 LVOT Area:     3.80 cm  RIGHT VENTRICLE             IVC RV S prime:     12.70 cm/s  IVC diam: 2.10 cm TAPSE (M-mode): 1.8 cm LEFT ATRIUM             Index       RIGHT ATRIUM           Index LA Vol (A2C):   97.4 ml 40.91 ml/m RA Area:     16.70 cm LA Vol (A4C):   77.3 ml 32.47 ml/m RA Volume:   44.50 ml  18.69 ml/m LA Biplane Vol: 90.6 ml 38.06 ml/m  AORTIC VALVE AV Area (Vmax):    0.60 cm AV Area (Vmean):   0.55 cm AV Area (VTI):     0.57 cm AV Vmax:           460.00 cm/s AV Vmean:          337.500 cm/s AV VTI:            1.175 m AV Peak Grad:      84.6 mmHg AV Mean Grad:      51.5 mmHg LVOT Vmax:         72.70 cm/s LVOT Vmean:        48.500 cm/s LVOT VTI:          0.178 m LVOT/AV VTI ratio: 0.15  AORTA Ao Root diam: 3.60 cm Ao Asc diam:  3.80 cm MITRAL VALVE MV Area (PHT): 2.87 cm      SHUNTS MV Decel Time: 264 msec     Systemic VTI:  0.18 m MV E velocity: 144.00 cm/s  Systemic Diam: 2.20 cm MV A velocity: 57.30 cm/s MV E/A ratio:  2.51 Candee Furbish MD Electronically signed by Candee Furbish MD Signature Date/Time: 06/20/2020/1:34:34 PM    Final      Assessment/Plan: 1. Diagnosis: R with R>L midline shift with SDH s/p craniotomy with generalized weakness s/p multiple falls 2.  3. Does the need for close, 24 hr/day medical supervision in concert with the patient's rehab needs make it unreasonable for this patient to be served in a less intensive setting? Yes 4. Co-Morbidities requiring supervision/potential complications: DM, HTN, neuropathy, generalized weakness,  5. Due to bladder management, bowel management, safety, skin/wound care, disease management, medication administration, pain management and patient education, does the patient require 24 hr/day rehab nursing? Yes 6. Does the patient require coordinated care of a physician, rehab nurse, therapy disciplines of PT and PT and SLP to address physical and functional deficits in the context of the above medical diagnosis(es)? Yes Addressing deficits in the following  areas: balance, endurance, locomotion, strength, transferring, bowel/bladder control, bathing, dressing, feeding, grooming, toileting and cognition 7. Can the patient actively participate in an intensive therapy program of at least 3 hrs of therapy per day at least 5 days per week? Yes 8. The potential for patient to make measurable gains while on inpatient rehab is good and fair 9. Anticipated functional outcomes upon discharge from inpatient rehab are supervision and min assist  with PT, supervision and min assist with OT, supervision with SLP. 10. Estimated rehab length of stay to reach the above functional goals is: 2-3 weeks 11. Anticipated discharge destination: Home 12. Overall Rehab/Functional Prognosis: good and fair  RECOMMENDATIONS: This patient's  condition is appropriate for continued rehabilitative care in the following setting: CIR Patient has agreed to participate in recommended program. Potentially Note that insurance prior authorization may be required for reimbursement for recommended care.  Comment:  1. Pt would benefit from having BM 2. Pain/HA is controlled per pt.  3. Will submit for insurance approval- will d/w admissions coordinators- daughter said could provide 24/7 care? 4. Thank you for this consult.    Lavon Paganini Angiulli, PA-C 06/21/2020    I have personally performed a face to face diagnostic evaluation of this patient and formulated the key components of the plan.  Additionally, I have personally reviewed laboratory data, imaging studies, as well as relevant notes and concur with the physician assistant's documentation above.

## 2020-06-21 NOTE — Progress Notes (Addendum)
Subjective: Patient reports "I think I'm doing ok"  Objective: Vital signs in last 24 hours: Temp:  [98.3 F (36.8 C)-100.1 F (37.8 C)] 98.3 F (36.8 C) (11/11 1154) Pulse Rate:  [64-92] 92 (11/11 0800) Resp:  [6-27] 24 (11/11 0800) BP: (90-130)/(46-89) 117/73 (11/11 0800) SpO2:  [96 %-100 %] 98 % (11/11 0800) Arterial Line BP: (122)/(49) 122/49 (11/10 1300)  Intake/Output from previous day: 11/10 0701 - 11/11 0700 In: 2430 [P.O.:587; I.V.:1543.3; IV Piggyback:299.7] Out: -  Intake/Output this shift: Total I/O In: 75 [I.V.:75] Out: -   Alert, conversant, sitting in bed. Denies pain. Scalp incision well approximated with staples, no drainage erythema or swelling. JP with minimal bloody drainage. MAEW. PEARL.   Lab Results: Recent Labs    06/19/20 1141 06/20/20 0454  WBC 14.8* 11.5*  HGB 15.3 12.1*  HCT 48.2 37.6*  PLT 206 152   BMET Recent Labs    06/19/20 1141 06/20/20 0454  NA 130* 134*  K 4.0 4.0  CL 98 107  CO2 22 18*  GLUCOSE 177* 148*  BUN 29* 28*  CREATININE 1.54* 1.55*  CALCIUM 8.7* 7.8*    Studies/Results: MR BRAIN WO CONTRAST  Result Date: 06/21/2020 CLINICAL DATA:  Subdural hematoma with decompression. Worsening generalized weakness. EXAM: MRI HEAD WITHOUT CONTRAST TECHNIQUE: Multiplanar, multiecho pulse sequences of the brain and surrounding structures were obtained without intravenous contrast. COMPARISON:  Head CT from 2 days ago FINDINGS: Brain: Interval decompression of subdural hematoma on the right, with drain in place. The collection and mass effect has diminished significantly. Maximal collection thickness is 11 mm today, as measured on coronal T2 weighted imaging. Midline shift has decreased to 3 mm (previously 7 mm). No acute infarct, hydrocephalus, or masslike finding. Vascular: Preserved flow voids Skull and upper cervical spine: Normal marrow signal Sinuses/Orbits: Negative IMPRESSION: 1. Interval decompression of the subdural hematoma  with significantly improved mass effect. Midline shift is decreased to 3 mm. 2. The brain itself appears normal for age.  No acute infarct. Electronically Signed   By: Monte Fantasia M.D.   On: 06/21/2020 06:53   US RENAL  Result Date: 06/19/2020 CLINICAL DATA:  CKD EXAM: RENAL / URINARY TRACT ULTRASOUND COMPLETE COMPARISON:  None. FINDINGS: Right Kidney: Renal measurements: 11.4 x 5.4 x 5.1 cm = volume: 164 mL. Cortical echogenicity is within normal limits. Mild right hydronephrosis with central calyceal dilatation. No shadowing calculus or concerning renal mass. Left Kidney: Renal measurements: 10.3 x 5.5 x 4.0 cm = volume: 117.4 mL. Cortical echogenicity is within normal limits. 4.9 x 1.5 x 2.1 cm hypoechoic lesion arising from the upper pole left kidney appears to be a solid lesion with some internal color flow. No other concerning renal mass. No urolithiasis or hydronephrosis. Bladder: Bandage in the pelvic area significantly limiting evaluation of the low midline pelvis without clear visualization of the urinary. Other: None. IMPRESSION: 1. Mild right hydronephrosis. 2. 4.9 cm hypoechoic lesion arising from the upper pole left kidney. Could reflect a solid mass. Recommend further evaluation with renal mass protocol MRI on an outpatient basis if patient is able to tolerate contrast media. 3. Nonvisualization of the urinary bladder, partially due to bandaging overlying the low anterior pelvis. Electronically Signed   By: Lovena Le M.D.   On: 06/19/2020 22:54   ECHOCARDIOGRAM COMPLETE  Result Date: 06/20/2020    ECHOCARDIOGRAM REPORT   Patient Name:   Brett Williamson Date of Exam: 06/20/2020 Medical Rec #:  [id number]    Height:  71.0 in Accession #:    [id number]   Weight:       266.1 lb Date of Birth:  [birth year]     BSA:          2.381 m Patient Age:    79 years     BP:           81/53 mmHg Patient Gender: M            HR:           62 bpm. Exam Location:  Inpatient Procedure: 2D Echo Indications:     murmur 785.2  History:        Patient has no prior history of Echocardiogram examinations.                 Aortic Valve Disease; Risk Factors:Hypertension.  Sonographer:    Johny Chess Referring Phys: [phone] DAVID TAT IMPRESSIONS  1. Severe aortic stenosis.  2. Left ventricular ejection fraction, by estimation, is 55 to 60%. The left ventricle has normal function. The left ventricle has no regional wall motion abnormalities. There is moderate left ventricular hypertrophy. Left ventricular diastolic parameters are consistent with Grade II diastolic dysfunction (pseudonormalization).  3. Right ventricular systolic function is normal. The right ventricular size is normal.  4. Left atrial size was mildly dilated.  5. The mitral valve is normal in structure. Mild mitral valve regurgitation. No evidence of mitral stenosis. Severe mitral annular calcification.  6. The aortic valve is calcified. There is severe calcifcation of the aortic valve. There is severe thickening of the aortic valve. Aortic valve regurgitation is mild to moderate. Severe aortic valve stenosis. Aortic valve area, by VTI measures 0.57 cm. Aortic valve mean gradient measures 51.5 mmHg. Aortic valve Vmax measures 4.60 m/s.  7. Aortic dilatation noted. There is mild dilatation of the ascending aorta, measuring 38 mm.  8. The inferior vena cava is normal in size with greater than 50% respiratory variability, suggesting right atrial pressure of 3 mmHg. Comparison(s): No prior Echocardiogram. FINDINGS  Left Ventricle: Left ventricular ejection fraction, by estimation, is 55 to 60%. The left ventricle has normal function. The left ventricle has no regional wall motion abnormalities. The left ventricular internal cavity size was normal in size. There is  moderate left ventricular hypertrophy. Left ventricular diastolic parameters are consistent with Grade II diastolic dysfunction (pseudonormalization). Right Ventricle: The right ventricular size is  normal. No increase in right ventricular wall thickness. Right ventricular systolic function is normal. Left Atrium: Left atrial size was mildly dilated. Right Atrium: Right atrial size was normal in size. Pericardium: There is no evidence of pericardial effusion. Mitral Valve: The mitral valve is normal in structure. Severe mitral annular calcification. Mild mitral valve regurgitation. No evidence of mitral valve stenosis. Tricuspid Valve: The tricuspid valve is normal in structure. Tricuspid valve regurgitation is not demonstrated. No evidence of tricuspid stenosis. Aortic Valve: The aortic valve is calcified. There is severe calcifcation of the aortic valve. There is severe thickening of the aortic valve. Aortic valve regurgitation is mild to moderate. Severe aortic stenosis is present. Aortic valve mean gradient measures 51.5 mmHg. Aortic valve peak gradient measures 84.6 mmHg. Aortic valve area, by VTI measures 0.57 cm. Pulmonic Valve: The pulmonic valve was normal in structure. Pulmonic valve regurgitation is mild. No evidence of pulmonic stenosis. Aorta: Aortic dilatation noted. There is mild dilatation of the ascending aorta, measuring 38 mm. Venous: The inferior vena cava is normal in size with greater than 50%  respiratory variability, suggesting right atrial pressure of 3 mmHg. IAS/Shunts: No atrial level shunt detected by color flow Doppler.  LEFT VENTRICLE PLAX 2D LVIDd:         5.20 cm  Diastology LVIDs:         3.70 cm  LV e' medial:    5.66 cm/s LV PW:         1.50 cm  LV E/e' medial:  25.4 LV IVS:        1.40 cm  LV e' lateral:   5.98 cm/s LVOT diam:     2.20 cm  LV E/e' lateral: 24.1 LV SV:         67 LV SV Index:   28 LVOT Area:     3.80 cm  RIGHT VENTRICLE             IVC RV S prime:     12.70 cm/s  IVC diam: 2.10 cm TAPSE (M-mode): 1.8 cm LEFT ATRIUM             Index       RIGHT ATRIUM           Index LA Vol (A2C):   97.4 ml 40.91 ml/m RA Area:     16.70 cm LA Vol (A4C):   77.3 ml 32.47  ml/m RA Volume:   44.50 ml  18.69 ml/m LA Biplane Vol: 90.6 ml 38.06 ml/m  AORTIC VALVE AV Area (Vmax):    0.60 cm AV Area (Vmean):   0.55 cm AV Area (VTI):     0.57 cm AV Vmax:           460.00 cm/s AV Vmean:          337.500 cm/s AV VTI:            1.175 m AV Peak Grad:      84.6 mmHg AV Mean Grad:      51.5 mmHg LVOT Vmax:         72.70 cm/s LVOT Vmean:        48.500 cm/s LVOT VTI:          0.178 m LVOT/AV VTI ratio: 0.15  AORTA Ao Root diam: 3.60 cm Ao Asc diam:  3.80 cm MITRAL VALVE MV Area (PHT): 2.87 cm     SHUNTS MV Decel Time: 264 msec     Systemic VTI:  0.18 m MV E velocity: 144.00 cm/s  Systemic Diam: 2.20 cm MV A velocity: 57.30 cm/s MV E/A ratio:  2.51 Candee Furbish MD Electronically signed by Candee Furbish MD Signature Date/Time: 06/20/2020/1:34:34 PM    Final     Assessment/Plan: improving  LOS: 2 days  Pt tolerates removal of JP drain well. Staple to site with 2x2 drsg. Hopeful for CIR.   Verdis Prime 06/21/2020, 12:18 PM   Postop imaging shows significant improvement in SDH and shift.  Patient is good candidate for Rehab.

## 2020-06-21 NOTE — TOC CAGE-AID Note (Signed)
Transition of Care [facility]) - CAGE-AID Screening   Patient Details  Name: Brett Williamson MRN: [id number] Date of Birth: [birth year]  Transition of [facility] CM/SW Contact:    Emeterio Reeve, [place] Phone Number: 06/21/2020, 3:37 PM   Clinical Narrative:  CSW met with pt at bedside. CSW introduced self and explained role at the hospital.  Pt denies alcohol use. Pt denies substance use. Pt did not need any resources at this time.    CAGE-AID Screening:    Have You Ever Felt You Ought to Cut Down on Your Drinking or Drug Use?: No Have People Annoyed You By Critizing Your Drinking Or Drug Use?: No Have You Felt Bad Or Guilty About Your Drinking Or Drug Use?: No Have You Ever Had a Drink or Used Drugs First Thing In The Morning to Steady Your Nerves or to Get Rid of a Hangover?: No CAGE-AID Score: 0  Substance Abuse Education Offered: Yes      Blima Ledger, [place] Social Worker [phone]

## 2020-06-21 NOTE — Progress Notes (Signed)
Physical Therapy Treatment Patient Details Name: Brett Williamson MRN: [id number] DOB: 1941/02/19 Today's Date: 06/21/2020    History of Present Illness Brett Williamson is a 79 y.o. male with PMH of HTN, DM II, presenting with worsening generalized weakness to the point where he is unable to get out of his chair. Pt with history of several with most recently being 4 falls on 11/7, including a slip out of his lift chair. CT brain showed moderately large SDH with leftwards midline shift. Pt underwent R crani for evacuation of SDH on 11/09.    PT Comments    Pt is progressing well with mobility and PT goals at this time. He was able to perform transfers with less difficulty/attempts, as well as demo improved tolerance for standing and gait initiation. The pt continues to require modA of 2 initially to steady in stance, as well as with initial steps as the pt reports significant stiffness and moves with significant lateral sway requiring modA through HHA. Will continue to address deficits in stability, strength, endurance, and gait training with use of AD to further improve pt safety and independence with mobility and reduce risk of falls. Pt continues to be an excellent candidate for continued rehab prior to d/c home to maximize safety with independence.     Follow Up Recommendations  CIR     Equipment Recommendations   (defer to post acute)    Recommendations for Other Services       Precautions / Restrictions Precautions Precautions: Fall Restrictions Weight Bearing Restrictions: No    Mobility  Bed Mobility Overal bed mobility: Needs Assistance Bed Mobility: Supine to Sit     Supine to sit: Min assist     General bed mobility comments: HOB elevated, pt able to move BLE to EOB, used RUE to pull to sit EOB, then minA to finish scooting to EOB. increased time and effort  Transfers Overall transfer level: Needs assistance Equipment used: 2 person hand held assist Transfers: Sit  to/from [employer] Sit to Stand: Mod assist;+2 physical assistance Stand pivot transfers: Mod assist;+2 physical assistance       General transfer comment: modA with initial stand and initial steps for transfer. pt with good use of hands at EOB, able to stand for > 10 min to allow for cleaning   Ambulation/Gait Ambulation/Gait assistance: Mod assist;+2 physical assistance Gait Distance (Feet): 8 Feet Assistive device: 2 person hand held assist Gait Pattern/deviations: Step-to pattern;Decreased stride length Gait velocity: decreased Gait velocity interpretation: <1.31 ft/sec, indicative of household ambulator General Gait Details: pt able to take multiple short steps away from bed and laterally to chair with HHA of 2, increased assist given on first steps. significant lateral movement    Modified Rankin (Stroke Patients Only) Modified Rankin (Stroke Patients Only) Pre-Morbid Rankin Score: Slight disability Modified Rankin: Moderately severe disability     Balance Overall balance assessment: Needs assistance Sitting-balance support: Feet supported;No upper extremity supported Sitting balance-Leahy Scale: Fair Sitting balance - Comments: able to reach to feet, not don socks sitting EOB   Standing balance support: Bilateral upper extremity supported;During functional activity Standing balance-Leahy Scale: Zero Standing balance comment: dependent on physical assist                            Cognition Arousal/Alertness: Awake/alert Behavior During Therapy: WFL for tasks assessed/performed Overall Cognitive Status: Within Functional Limits for tasks assessed  General Comments: Pt able to answer orientation questions and responding appropriately to education and commands through session. Pt reports falling multiple times in a day at home prior to arrival      Exercises      General Comments General  comments (skin integrity, edema, etc.): pt with bilateral LE discoloration. VSS on RA (pt on 2L O2 upon arrival of PT)      Pertinent Vitals/Pain Pain Assessment: No/denies pain           PT Goals (current goals can now be found in the care plan section) Acute Rehab PT Goals Patient Stated Goal: get stronger Progress towards PT goals: Progressing toward goals    Frequency    Min 4X/week      PT Plan Current plan remains appropriate    Co-evaluation PT/OT/SLP Co-Evaluation/Treatment: Yes Reason for Co-Treatment: Complexity of the patient's impairments (multi-system involvement);For patient/therapist safety;To address functional/ADL transfers PT goals addressed during session: Mobility/safety with mobility;Balance;Proper use of DME        AM-PAC PT "6 Clicks" Mobility   Outcome Measure  Help needed turning from your back to your side while in a flat bed without using bedrails?: A Little Help needed moving from lying on your back to sitting on the side of a flat bed without using bedrails?: A Little Help needed moving to and from a bed to a chair (including a wheelchair)?: A Lot Help needed standing up from a chair using your arms (e.g., wheelchair or bedside chair)?: A Lot Help needed to walk in hospital room?: A Lot Help needed climbing 3-5 steps with a railing? : Total 6 Click Score: 13    End of Session Equipment Utilized During Treatment: Gait belt Activity Tolerance: Patient tolerated treatment well Patient left: in chair;with call bell/phone within reach;with chair alarm set Nurse Communication: Mobility status PT Visit Diagnosis: Unsteadiness on feet (R26.81);Muscle weakness (generalized) (M62.81);Difficulty in walking, not elsewhere classified (R26.2)     Time: 8099-8338 PT Time Calculation (min) (ACUTE ONLY): 23 min  Charges:  $Gait Training: 8-22 mins                     Karma Ganja, PT, DPT   Acute Rehabilitation Department Pager #: [phone]   Otho Bellows 06/21/2020, 1:16 PM

## 2020-06-22 ENCOUNTER — Other Ambulatory Visit: Payer: Self-pay

## 2020-06-22 LAB — URINE CULTURE: Culture: >=100000 — AB

## 2020-06-22 LAB — GLUCOSE, CAPILLARY
Glucose-Capillary: 139 mg/dL — ABNORMAL HIGH (ref 70–99)
Glucose-Capillary: 151 mg/dL — ABNORMAL HIGH (ref 70–99)
Glucose-Capillary: 153 mg/dL — ABNORMAL HIGH (ref 70–99)
Glucose-Capillary: 177 mg/dL — ABNORMAL HIGH (ref 70–99)

## 2020-06-22 MED ORDER — LEVETIRACETAM 500 MG PO TABS
500.0000 mg | ORAL_TABLET | Freq: Two times a day (BID) | ORAL | Status: DC
  Administered 2020-06-22 – 2020-06-25 (×7): 500 mg via ORAL
  Filled 2020-06-22 (×7): qty 1

## 2020-06-22 MED ORDER — PROCHLORPERAZINE EDISYLATE 10 MG/2ML IJ SOLN
5.0000 mg | Freq: Four times a day (QID) | INTRAMUSCULAR | Status: DC | PRN
  Administered 2020-06-22 (×2): 5 mg via INTRAVENOUS
  Filled 2020-06-22 (×2): qty 2

## 2020-06-22 NOTE — Progress Notes (Signed)
Occupational Therapy Evaluation (late entry)  Pt presents to OT with the below listed diagnosis and deficits.  He presents to OT with generalized weakness, decreased balance, decreased activity tolerance, and will benefit from further cognitive assessment.  He currently requires min - total A for ADLs, and min - mod A +2 for functional mobility.  He lived alone PTA, and has good support from daughter, grand daughter, and girlfriend (per his report), and reports he was able to perform ADLs at mod I level PTA.  Recommend CIR.     06/21/20 1600  OT Visit Information  Last OT Received On 06/22/20  Assistance Needed +2  PT/OT/SLP Co-Evaluation/Treatment Yes  Reason for Co-Treatment Complexity of the patient's impairments (multi-system involvement);For patient/therapist safety;To address functional/ADL transfers  OT goals addressed during session ADL's and self-care  History of Present Illness Pt is an 79 y/o M presenting to the ED on 11/9 after having 4 falls on 11/7 and then subsequent L sided weakness to the point where he couldn't get out of his chair. On 11/9 a craniotomy was performed for a R Subdural hematoma. PMH includes: HTN and DM II.  Home Living  Family/patient expects to be discharged to: Inpatient rehab  Living Arrangements Alone  Type of [place] One level  Bathroom Shower/Tub Tub/shower unit  [place] Grab bars - tub/shower;Grab bars - toilet;Database administrator aid  Prior Function  Level of Independence Independent with assistive device(s)  Comments Pt reports he uses a sock aid for socks. He has long standing numbness/tingling in fingertips bil hands.   Heambulates with RW.  He drives.  He grocery shops sometimes, and daughter and girlfriend assist PRN.     Communication  Communication No difficulties  Pain Assessment  Pain Assessment No/denies pain  Cognition  Arousal/Alertness Awake/alert   Behavior During Therapy WFL for tasks assessed/performed  Overall Cognitive Status Within Functional Limits for tasks assessed  General Comments WFL for basic info and tasks.  Will benefit from further assessment   Upper Extremity Assessment  Upper Extremity Assessment Generalized weakness;RUE deficits/detail;LUE deficits/detail  RUE Deficits / Details pt with torn rotator cuff, limited shld flex, 3-/5.   PROM WFL.  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  PROM WFL. Athritic deformity bil. hands   LUE Sensation decreased light touch  LUE Coordination decreased fine motor;decreased gross motor  Lower Extremity Assessment  Lower Extremity Assessment Defer to PT evaluation  Cervical / Trunk Assessment  Cervical / Trunk Assessment Normal  ADL  Overall ADL's  Needs assistance/impaired  Eating/Feeding Set up;Sitting;Bed level  Grooming Wash/dry hands;Wash/dry face;Oral care;Brushing hair;Set up;Sitting  Upper Body Bathing Minimal assistance;Sitting  Lower Body Bathing Moderate assistance;Sit to/from stand  Upper Body Dressing  Moderate assistance;Sitting  Lower Body Dressing Maximal assistance;Sit to/from stand  Lower Body Dressing Details (indicate cue type and reason) uses sock aid at home   Toilet Transfer Moderate assistance;+2 for physical assistance;+2 for safety/equipment;Stand-pivot;BSC;RW  Toileting- Clothing Manipulation and Hygiene Sit to/from stand;Total assistance  Toileting - Clothing Manipulation Details (indicate cue type and reason) Pt incontinent of urine and stool.  Assisted with peri care in standing   Functional mobility during ADLs Moderate assistance;+2 for  physical assistance;+2 for safety/equipment;Rolling  walker  Vision- History  Baseline Vision/History Wears glasses  Wears Glasses  (intermittently "I should wear them all the time" )  Patient Visual Report No change from baseline  Vision- Assessment  Vision Assessment? Yes  Eye Alignment WFL  Ocular Range of Motion [facility]  Alignment/Gaze Preference WDL  Tracking/Visual Pursuits Able to track stimulus in all quads without difficulty  Visual Fields No apparent deficits  Bed Mobility  Overal bed mobility Needs Assistance  Bed Mobility Supine to Sit  Supine to sit Min assist  General bed mobility comments HOB elevated, pt able to move BLE to EOB, used RUE to pull to sit EOB, then minA to finish scooting to EOB. increased time and effort  Transfers  Overall transfer level Needs assistance  Equipment used 2 person hand held assist  Transfers Sit to/from [employer] Transfers  Sit to Stand Mod assist;+2 physical assistance  Stand pivot transfers Mod assist;+2 physical assistance  General transfer comment modA with initial stand and initial steps for transfer. pt with good use of hands at EOB, able to stand for > 10 min to allow for cleaning   Balance  Overall balance assessment Needs assistance  Sitting-balance support Feet supported;No upper extremity supported  Sitting balance-Leahy Scale Fair  Sitting balance - Comments able to reach to feet, not don socks sitting EOB  Standing balance support Bilateral upper extremity supported;During functional activity  Standing balance-Leahy Scale Zero  Standing balance comment dependent on physical assist  General Comments  General comments (skin integrity, edema, etc.) pt with bilateral LE discoloration. VSS on RA (pt on 2L O2 upon arrival of OT/PT).  02 sats >96% on RA  OT - End of Session  Equipment Utilized During Treatment Gait belt;Rolling walker  Activity Tolerance Patient tolerated treatment well  Patient left in chair;with call bell/phone within reach;with chair alarm set;with  nursing/sitter in room  OT Assessment  OT Recommendation/Assessment Patient needs continued OT Services  OT Visit Diagnosis Unsteadiness on feet (R26.81)  OT Problem List Decreased strength;Decreased activity tolerance;Impaired balance (sitting and/or standing);Decreased coordination;Decreased cognition;Decreased safety awareness;Decreased knowledge of use of DME or AE;Impaired UE functional use  OT Plan  OT Frequency (ACUTE ONLY) Min 2X/week  OT Treatment/Interventions (ACUTE ONLY) Self-care/ADL training;DME and/or AE instruction;Therapeutic activities;Cognitive remediation/compensation;Patient/family education;Balance training  AM-PAC OT "6 Clicks" Daily Activity Outcome Measure (Version 2)  Help from another person eating meals? 3  Help from another person taking care of personal grooming? 3  Help from another person toileting, which includes using toliet, bedpan, or urinal? 2  Help from another person bathing (including washing, rinsing, drying)? 2  Help from another person to put on and taking off regular upper body clothing? 2  Help from another person to put on and taking off regular lower body clothing? 2  6 Click Score 14  OT Recommendation  Recommendations for Other Services Rehab consult  Follow Up Recommendations CIR;Supervision/Assistance - 24 hour  OT Equipment None recommended by OT  Individuals Consulted  Consulted and Agree with Results and Recommendations Patient  Acute Rehab OT Goals  Patient Stated Goal to get back to normal   OT Goal Formulation With patient  Time For Goal Achievement 07/06/20  Potential to Achieve Goals Good  OT Time Calculation  OT Start Time (ACUTE ONLY) 1208  OT Stop Time (ACUTE ONLY) 1231  OT Time Calculation (min) 23 min  OT General Charges  $OT Visit 1 Visit  OT Evaluation  $OT Eval Moderate Complexity  1 Mod  Written Expression  Dominant Hand Right  Nilsa Nutting., OTR/L Acute Rehabilitation Services Pager [phone] Office  [phone]

## 2020-06-22 NOTE — Progress Notes (Signed)
Physical Therapy Treatment Patient Details Name: Brett Williamson MRN: [id number] DOB: 03-12-1941 Today's Date: 06/22/2020    History of Present Illness Pt is an 79 y/o M presenting to the ED on 11/9 after having 4 falls on 11/7 and then subsequent L sided weakness to the point where he couldn't get out of his chair. On 11/9 a craniotomy was performed for a R Subdural hematoma. PMH includes: HTN and DM II.    PT Comments    The pt is continuing pt progress well with PT goals and mobility at this time. With transition to use of a RW for mobility, he was able to navigate in his room with minA of 1, but remains limited by endurance, stability, and posture while mobilizing. The pt was able to complete 5 sit-stands from his recliner at the end of the session with minA/minG to steady, but required >2 min to complete safely at this time, heavy use of UE, and verbal cues for sequencing. The pt will continue to benefit from skilled PT to progress safety and endurance with mobility, as well as to facilitate return to prior level of independence.     Follow Up Recommendations  CIR     Equipment Recommendations   (defer to post acute)    Recommendations for Other Services       Precautions / Restrictions Precautions Precautions: Fall Restrictions Weight Bearing Restrictions: No    Mobility  Bed Mobility Overal bed mobility: Needs Assistance Bed Mobility: Supine to Sit     Supine to sit: Min assist     General bed mobility comments: HOB elevated, pt able to move BLE to EOB, used RUE to pull to sit EOB, then minA to finish scooting to EOB. increased time and effort  Transfers Overall transfer level: Needs assistance Equipment used: Rolling walker (2 wheeled) Transfers: Sit to/from [facility] Sit to Stand: Min assist Stand pivot transfers: Min assist       General transfer comment: modA initially, but able to complete with minA-minG through session. completed x8 through  session  Ambulation/Gait Ambulation/Gait assistance: Min assist Gait Distance (Feet): 20 Feet Assistive device: Rolling walker (2 wheeled) Gait Pattern/deviations: Step-through pattern;Decreased stride length;Trunk flexed Gait velocity: decreased Gait velocity interpretation: <1.31 ft/sec, indicative of household ambulator General Gait Details: pt able to walk in room with RW and minA for line managment/stability. No LOB during session.     Modified Rankin (Stroke Patients Only) Modified Rankin (Stroke Patients Only) Pre-Morbid Rankin Score: Slight disability Modified Rankin: Moderately severe disability     Balance Overall balance assessment: Needs assistance Sitting-balance support: Feet supported;No upper extremity supported Sitting balance-Leahy Scale: Fair Sitting balance - Comments: able to reach to feet, not don socks sitting EOB   Standing balance support: Bilateral upper extremity supported;During functional activity Standing balance-Leahy Scale: Poor Standing balance comment: reliant on BUE support for stability with gait                            Cognition Arousal/Alertness: Awake/alert Behavior During Therapy: WFL for tasks assessed/performed Overall Cognitive Status: Within Functional Limits for tasks assessed                                 General Comments: WFL for basic info and tasks, pt able to follow instructions given and navigate around room without cues/assist.       Exercises  General Exercises - Lower Extremity Long Arc Quad: AROM;Both;10 reps;Seated Heel Raises: PROM;Both;15 reps;Seated    General Comments General comments (skin integrity, edema, etc.): bilateral LE discoloration from DM II      Pertinent Vitals/Pain Pain Assessment: No/denies pain           PT Goals (current goals can now be found in the care plan section) Acute Rehab PT Goals Patient Stated Goal: to get back to normal  Progress towards PT  goals: Progressing toward goals    Frequency    Min 4X/week      PT Plan Current plan remains appropriate       AM-PAC PT "6 Clicks" Mobility   Outcome Measure  Help needed turning from your back to your side while in a flat bed without using bedrails?: A Little Help needed moving from lying on your back to sitting on the side of a flat bed without using bedrails?: A Little Help needed moving to and from a bed to a chair (including a wheelchair)?: A Little Help needed standing up from a chair using your arms (e.g., wheelchair or bedside chair)?: A Little Help needed to walk in hospital room?: A Little Help needed climbing 3-5 steps with a railing? : Total 6 Click Score: 16    End of Session Equipment Utilized During Treatment: Gait belt Activity Tolerance: Patient tolerated treatment well Patient left: in chair;with call bell/phone within reach;with chair alarm set Nurse Communication: Mobility status PT Visit Diagnosis: Unsteadiness on feet (R26.81);Muscle weakness (generalized) (M62.81);Difficulty in walking, not elsewhere classified (R26.2)     Time: 1423-1450 PT Time Calculation (min) (ACUTE ONLY): 27 min  Charges:  $Gait Training: 23-37 mins                     Karma Ganja, PT, DPT   Acute Rehabilitation Department Pager #: [phone]   Otho Bellows 06/22/2020, 4:10 PM

## 2020-06-22 NOTE — Progress Notes (Signed)
Paged returned from Dr. Reatha Armour. New orders received for IV compazine 5 mg PRN every 6 hours for nausea and vomiting. Will continue to monitor.

## 2020-06-22 NOTE — Progress Notes (Signed)
Inpatient Rehab Admissions:  Inpatient Rehab Consult received.  I met with patient at the bedside for rehabilitation assessment and to discuss goals and expectations of an inpatient rehab admission.  I also called his daughter, [name], to discuss over the phone.  We discussed goals of supervision to min assist with an expected length of stay to be about 2-3 weeks.  [name] states family can provide 24/7 assist and they will probably end up staying with pt in his handicap accessible apartment.  We discussed Medicare Part A coverage may not fully cover CIR, and [name] states that's fine.  I also offered to have [facility] screened for [facility] and they accepted that offer.  Will refer to [facility].  I will not have a bed available for this patient through the weekend, so I will f/u next week.   Signed: Shann Medal, PT, DPT Admissions Coordinator [phone] 06/22/20  1:16 PM

## 2020-06-22 NOTE — Progress Notes (Addendum)
Subjective: Patient reports that he is doing well and has no complaints at this time. He did have some N/V overnight which responded will to antiemetic.   Objective: Vital signs in last 24 hours: Temp:  [98.3 F (36.8 C)-99.8 F (37.7 C)] 99.5 F (37.5 C) (11/12 0400) Pulse Rate:  [67-111] 101 (11/12 0700) Resp:  [17-27] 23 (11/12 0700) BP: (104-152)/(60-94) 131/77 (11/12 0700) SpO2:  [88 %-100 %] 95 % (11/12 0700)  Intake/Output from previous day: 11/11 0701 - 11/12 0700 In: 1817.5 [I.V.:1489.4; IV Piggyback:328.1] Out: -  Intake/Output this shift: No intake/output data recorded.  Physical Exam: A/O X4, conversant, sitting in bed. Denies pain. Scalp incision well approximated with staples, no drainage erythema or swelling. Cranial nerves grossly intact. MAEW. PEARL.   Lab Results: Recent Labs    06/19/20 1141 06/20/20 0454  WBC 14.8* 11.5*  HGB 15.3 12.1*  HCT 48.2 37.6*  PLT 206 152   BMET Recent Labs    06/19/20 1141 06/20/20 0454  NA 130* 134*  K 4.0 4.0  CL 98 107  CO2 22 18*  GLUCOSE 177* 148*  BUN 29* 28*  CREATININE 1.54* 1.55*  CALCIUM 8.7* 7.8*    Studies/Results: MR BRAIN WO CONTRAST  Result Date: 06/21/2020 CLINICAL DATA:  Subdural hematoma with decompression. Worsening generalized weakness. EXAM: MRI HEAD WITHOUT CONTRAST TECHNIQUE: Multiplanar, multiecho pulse sequences of the brain and surrounding structures were obtained without intravenous contrast. COMPARISON:  Head CT from 2 days ago FINDINGS: Brain: Interval decompression of subdural hematoma on the right, with drain in place. The collection and mass effect has diminished significantly. Maximal collection thickness is 11 mm today, as measured on coronal T2 weighted imaging. Midline shift has decreased to 3 mm (previously 7 mm). No acute infarct, hydrocephalus, or masslike finding. Vascular: Preserved flow voids Skull and upper cervical spine: Normal marrow signal Sinuses/Orbits: Negative  IMPRESSION: 1. Interval decompression of the subdural hematoma with significantly improved mass effect. Midline shift is decreased to 3 mm. 2. The brain itself appears normal for age.  No acute infarct. Electronically Signed   By: Monte Fantasia M.D.   On: 06/21/2020 06:53   ECHOCARDIOGRAM COMPLETE  Result Date: 06/20/2020    ECHOCARDIOGRAM REPORT   Patient Name:   Brett Williamson Date of Exam: 06/20/2020 Medical Rec #:  [id number]    Height:       71.0 in Accession #:    [id number]   Weight:       266.1 lb Date of Birth:  May 30, 1941     BSA:          2.381 m Patient Age:    79 years     BP:           81/53 mmHg Patient Gender: M            HR:           62 bpm. Exam Location:  Inpatient Procedure: 2D Echo Indications:    murmur 785.2  History:        Patient has no prior history of Echocardiogram examinations.                 Aortic Valve Disease; Risk Factors:Hypertension.  Sonographer:    Johny Chess Referring Phys: [phone] DAVID TAT IMPRESSIONS  1. Severe aortic stenosis.  2. Left ventricular ejection fraction, by estimation, is 55 to 60%. The left ventricle has normal function. The left ventricle has no regional wall motion abnormalities. There is moderate left ventricular  hypertrophy. Left ventricular diastolic parameters are consistent with Grade II diastolic dysfunction (pseudonormalization).  3. Right ventricular systolic function is normal. The right ventricular size is normal.  4. Left atrial size was mildly dilated.  5. The mitral valve is normal in structure. Mild mitral valve regurgitation. No evidence of mitral stenosis. Severe mitral annular calcification.  6. The aortic valve is calcified. There is severe calcifcation of the aortic valve. There is severe thickening of the aortic valve. Aortic valve regurgitation is mild to moderate. Severe aortic valve stenosis. Aortic valve area, by VTI measures 0.57 cm. Aortic valve mean gradient measures 51.5 mmHg. Aortic valve Vmax measures 4.60 m/s.  7.  Aortic dilatation noted. There is mild dilatation of the ascending aorta, measuring 38 mm.  8. The inferior vena cava is normal in size with greater than 50% respiratory variability, suggesting right atrial pressure of 3 mmHg. Comparison(s): No prior Echocardiogram. FINDINGS  Left Ventricle: Left ventricular ejection fraction, by estimation, is 55 to 60%. The left ventricle has normal function. The left ventricle has no regional wall motion abnormalities. The left ventricular internal cavity size was normal in size. There is  moderate left ventricular hypertrophy. Left ventricular diastolic parameters are consistent with Grade II diastolic dysfunction (pseudonormalization). Right Ventricle: The right ventricular size is normal. No increase in right ventricular wall thickness. Right ventricular systolic function is normal. Left Atrium: Left atrial size was mildly dilated. Right Atrium: Right atrial size was normal in size. Pericardium: There is no evidence of pericardial effusion. Mitral Valve: The mitral valve is normal in structure. Severe mitral annular calcification. Mild mitral valve regurgitation. No evidence of mitral valve stenosis. Tricuspid Valve: The tricuspid valve is normal in structure. Tricuspid valve regurgitation is not demonstrated. No evidence of tricuspid stenosis. Aortic Valve: The aortic valve is calcified. There is severe calcifcation of the aortic valve. There is severe thickening of the aortic valve. Aortic valve regurgitation is mild to moderate. Severe aortic stenosis is present. Aortic valve mean gradient measures 51.5 mmHg. Aortic valve peak gradient measures 84.6 mmHg. Aortic valve area, by VTI measures 0.57 cm. Pulmonic Valve: The pulmonic valve was normal in structure. Pulmonic valve regurgitation is mild. No evidence of pulmonic stenosis. Aorta: Aortic dilatation noted. There is mild dilatation of the ascending aorta, measuring 38 mm. Venous: The inferior vena cava is normal in size  with greater than 50% respiratory variability, suggesting right atrial pressure of 3 mmHg. IAS/Shunts: No atrial level shunt detected by color flow Doppler.  LEFT VENTRICLE PLAX 2D LVIDd:         5.20 cm  Diastology LVIDs:         3.70 cm  LV e' medial:    5.66 cm/s LV PW:         1.50 cm  LV E/e' medial:  25.4 LV IVS:        1.40 cm  LV e' lateral:   5.98 cm/s LVOT diam:     2.20 cm  LV E/e' lateral: 24.1 LV SV:         67 LV SV Index:   28 LVOT Area:     3.80 cm  RIGHT VENTRICLE             IVC RV S prime:     12.70 cm/s  IVC diam: 2.10 cm TAPSE (M-mode): 1.8 cm LEFT ATRIUM             Index       RIGHT ATRIUM  Index LA Vol (A2C):   97.4 ml 40.91 ml/m RA Area:     16.70 cm LA Vol (A4C):   77.3 ml 32.47 ml/m RA Volume:   44.50 ml  18.69 ml/m LA Biplane Vol: 90.6 ml 38.06 ml/m  AORTIC VALVE AV Area (Vmax):    0.60 cm AV Area (Vmean):   0.55 cm AV Area (VTI):     0.57 cm AV Vmax:           460.00 cm/s AV Vmean:          337.500 cm/s AV VTI:            1.175 m AV Peak Grad:      84.6 mmHg AV Mean Grad:      51.5 mmHg LVOT Vmax:         72.70 cm/s LVOT Vmean:        48.500 cm/s LVOT VTI:          0.178 m LVOT/AV VTI ratio: 0.15  AORTA Ao Root diam: 3.60 cm Ao Asc diam:  3.80 cm MITRAL VALVE MV Area (PHT): 2.87 cm     SHUNTS MV Decel Time: 264 msec     Systemic VTI:  0.18 m MV E velocity: 144.00 cm/s  Systemic Diam: 2.20 cm MV A velocity: 57.30 cm/s MV E/A ratio:  2.51 Candee Furbish MD Electronically signed by Candee Furbish MD Signature Date/Time: 06/20/2020/1:34:34 PM    Final     Assessment/Plan: Patient is doing well overall. He had episode of N/V overnight that responded well to antiemetic. He remained neuro intact. Plan to transfer out of ICU today. [facility] for CIR.   LOS: 3 days     Brett Moeller, DNP, NP-C 06/22/2020, 8:04 AM  Patient is doing well.  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  Transfer to Rehab when bed is available.

## 2020-06-22 NOTE — PMR Pre-admission (Addendum)
PMR Admission Coordinator Pre-Admission Assessment  Patient: Brett Williamson is an 79 y.o., male MRN: [id number] DOB: 12/29/1940 Height: 5\' 11"  (180.3 cm) Weight: 120.7 kg              Insurance Information HMO:     PPO:      PCP:      IPA:      80/20:      OTHER:  PRIMARY: Medicare Part A only      Policy#: [id number]      Subscriber: pt CM Name:       Phone#:      Fax#:  Pre-Cert#: verified Civil engineer, contracting:  Benefits:  Phone #:     Name:  Eff. Date: Part A 10/09/04     Deduct: $1484      Out of Pocket Max: n/a      Life Max: n/a  CIR: per Medicare Guidelines      SNF: 20 full days Outpatient: no benefits     Co-Pay:  Home Health: 100% per Medicare guidelines      Co-Pay:  DME: no benefits     Co-Pay:  Providers:  SECONDARY:       Policy#:       Phone#:   Development worker, community:       Phone#:   The Therapist, art Information Summary" for patients in Inpatient Rehabilitation Facilities with attached "Privacy Act [place] Records" was provided and verbally reviewed with: Patient  Emergency Contact Information Contact Information    Name Relation Home Work Mobile   hodges,serena Granddaughter   [phone]   hodges,catherine Daughter   [phone]     Current Medical History  Patient Admitting Diagnosis: fall with R SDH, s/p craniotomy for evacuation   History of Present Illness: Brett Williamson is a 79 y.o. right-handed male with history of hypertension, diabetes mellitus.  Presented 06/19/2020 with report frequent falls as well as generalized weakness.  No reported loss of consciousness.  CT of the head showed moderately large right-sided subdural hematoma with 8 mm leftward midline shift.  Admission chemistry sodium 130 glucose 177 BUN 29 creatinine 1.54, CK 86, hemoglobin 15.3 WBC 14,800, urine culture greater 100,000 gram-negative rods and placed on Rocephin, lactic acid 1.4.  Patient underwent craniotomy hematoma evacuation on 06/19/2020 per Dr. Vertell Limber.  MRI follow-up  06/21/2020 showed interval decompression of subdural hematoma with significantly improved mass-effect.  Midline shift decreased to 3 mm.  Echocardiogram 06/21/2020 ejection fraction 55 to 60% no wall motion abnormalities grade 2 diastolic dysfunction.  Maintained on Keppra for seizure prophylaxis.  Wound care nurse follow-up for multiple wounds scrotum buttocks perineum with wound care as directed.  Therapy evaluations completed with recommendations of physical medicine rehab consult.   Glasgow Coma Scale Score: 15  Past Medical History  History reviewed. No pertinent past medical history.  Family History  family history is not on file.  Prior Rehab/Hospitalizations:  Has the patient had prior rehab or hospitalizations prior to admission? No  Has the patient had major surgery during 100 days prior to admission? Yes  Current Medications   Current Facility-Administered Medications:  .  0.9 % NaCl with KCl 20 mEq/ L  infusion, , Intravenous, Continuous, Erline Levine, MD, Stopped at 06/22/20 [phone] .  acetaminophen (TYLENOL) tablet 650 mg, 650 mg, Oral, Q4H PRN, 650 mg at 06/25/20 0431 **OR** acetaminophen (TYLENOL) suppository 650 mg, 650 mg, Rectal, Q4H PRN, Erline Levine, MD .  bisacodyl (DULCOLAX) suppository 10 mg, 10  mg, Rectal, Daily PRN, Erline Levine, MD .  cefTRIAXone (ROCEPHIN) 1 g in sodium chloride 0.9 % 100 mL IVPB, 1 g, Intravenous, Q24H, Tat, David, MD, Last Rate: 200 mL/hr at 06/24/20 1457, 1 g at 06/24/20 1457 .  Chlorhexidine Gluconate Cloth 2 % PADS 6 each, 6 each, Topical, Daily, Erline Levine, MD, 6 each at 06/24/20 1026 .  docusate sodium (COLACE) capsule 100 mg, 100 mg, Oral, BID, Erline Levine, MD, 100 mg at 06/24/20 1026 .  Gerhardt's butt cream, , Topical, TID, Erline Levine, MD, 1 application at 28/78/67 1003 .  HYDROcodone-acetaminophen (NORCO/VICODIN) 5-325 MG per tablet 1 tablet, 1 tablet, Oral, Q4H PRN, Erline Levine, MD .  HYDROmorphone (DILAUDID) injection 0.5-1  mg, 0.5-1 mg, Intravenous, Q2H PRN, Erline Levine, MD .  insulin aspart (novoLOG) injection 0-9 Units, 0-9 Units, Subcutaneous, TID WC, Orson Eva, MD, 1 Units at 06/25/20 [phone] .  labetalol (NORMODYNE) injection 10-40 mg, 10-40 mg, Intravenous, Q10 min PRN, Erline Levine, MD .  levETIRAcetam (KEPPRA) tablet 500 mg, 500 mg, Oral, BID, Erline Levine, MD, 500 mg at 06/25/20 1003 .  ondansetron (ZOFRAN) tablet 4 mg, 4 mg, Oral, Q4H PRN **OR** ondansetron (ZOFRAN) injection 4 mg, 4 mg, Intravenous, Q4H PRN, Erline Levine, MD, 4 mg at 06/22/20 0037 .  pantoprazole (PROTONIX) EC tablet 40 mg, 40 mg, Oral, QHS, Erline Levine, MD, 40 mg at 06/24/20 2121 .  prochlorperazine (COMPAZINE) injection 5 mg, 5 mg, Intravenous, Q6H PRN, Dawley, Troy C, DO, 5 mg at 06/22/20 1125 .  promethazine (PHENERGAN) tablet 12.5-25 mg, 12.5-25 mg, Oral, Q4H PRN, Erline Levine, MD, 12.5 mg at 06/22/20 0126 .  senna-docusate (Senokot-S) tablet 1 tablet, 1 tablet, Oral, QHS PRN, Erline Levine, MD .  sodium phosphate (FLEET) 7-19 GM/118ML enema 1 enema, 1 enema, Rectal, Once PRN, Erline Levine, MD  Patients Current Diet:  Diet Order            Diet Carb Modified Fluid consistency: Thin; Room service appropriate? Yes with Assist  Diet effective now                 Precautions / Restrictions Precautions Precautions: Fall Precaution Comments: R a-line in wrist Restrictions Weight Bearing Restrictions: No   Has the patient had 2 or more falls or a fall with injury in the past year?Yes  Prior Activity Level Limited Community (1-2x/wk): progressing debility over the last several weeks/months (per chart review), used RW at baseline, had at least 4 falls the day of admission  Prior Functional Level Prior Function Level of Independence: Independent with assistive device(s) Comments: Pt reports he uses a sock aid for socks. He has long standing numbness/tingling in fingertips bil hands.   Heambulates with RW.  He drives.   He grocery shops sometimes, and daughter and girlfriend assist PRN.     Self Care: Did the patient need help bathing, dressing, using the toilet or eating?  Independent   Indoor Mobility: Did the patient need assistance with walking from room to room (with or without device)? Independent  Stairs: Did the patient need assistance with internal or external stairs (with or without device)? Needed some help  Functional Cognition: Did the patient need help planning regular tasks such as shopping or remembering to take medications? Independent  Home Assistive Devices / Equipment Home Assistive Devices/Equipment: Environmental consultant (specify type) ("rolling walker") Home Equipment: Grab bars - tub/shower, Grab bars - toilet, Adaptive equipment  Prior Device Use: Indicate devices/aids used by the patient prior to current illness,  exacerbation or injury? Walker and lift chair  Current Functional Level Cognition  Overall Cognitive Status: Within Functional Limits for tasks assessed Orientation Level: Oriented X4 General Comments: WFL for basic info and tasks, pt able to follow instructions given and navigate around room without cues/assist.     Extremity Assessment (includes Sensation/Coordination)  Upper Extremity Assessment: Generalized weakness, RUE deficits/detail, LUE deficits/detail RUE Deficits / Details: pt with torn rotator cuff, limited shld flex, 3-/5.   PROM WFL.  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  PROM WFL. Athritic deformity bil. hands  LUE Sensation: decreased light touch LUE Coordination: decreased fine motor, decreased gross motor  Lower Extremity Assessment: Defer to PT evaluation    ADLs  Overall ADL's : Needs  assistance/impaired Eating/Feeding: Set up, Sitting, Bed level Grooming: Wash/dry hands, Wash/dry face, Oral care, Brushing hair, Set up, Sitting Upper Body Bathing: Minimal assistance, Sitting Lower Body Bathing: Moderate assistance, Sit to/from stand Upper Body Dressing : Moderate assistance, Sitting Lower Body Dressing: Maximal assistance, Sit to/from stand Lower Body Dressing Details (indicate cue type and reason): uses sock aid at home  Toilet Transfer: Moderate assistance, +2 for physical assistance, +2 for safety/equipment, Stand-pivot, BSC, RW Toileting- Clothing Manipulation and Hygiene: Sit to/from stand, Total assistance Toileting - Clothing Manipulation Details (indicate cue type and reason): Pt incontinent of urine and stool.  Assisted with peri care in standing  Functional mobility during ADLs: Moderate assistance, +2 for physical assistance, +2 for safety/equipment, Rolling walker    Mobility  Overal bed mobility: Needs Assistance Bed Mobility: Supine to Sit Supine to sit: Mod assist General bed mobility comments: modA with flat bed, pt able to move BLE to EOB but requires increased time, use of rail, and modA to raise trunk from [facility]    Transfers  Overall transfer level: Needs assistance Equipment used: Rolling walker (2 wheeled) Transfers: Sit to/from Stand, [employer] Transfers Sit to Stand: Min assist Stand pivot transfers: Min assist General transfer comment: modA initially, but able to complete x3 in a row at end of session with minA and verbal cues. but requires ~3 min to complete     Ambulation / Gait / Stairs / Wheelchair Mobility  Ambulation/Gait Ambulation/Gait assistance: Herbalist (Feet): 15 Feet Assistive device: Rolling walker (2 wheeled) Gait Pattern/deviations: Step-through pattern, Decreased stride length, Trunk flexed General Gait Details: pt able to walk in room with RW and minA for line managment/stability. No LOB during session.   Gait velocity: decreased Gait velocity interpretation: <1.31 ft/sec, indicative of household ambulator    Posture / Balance Dynamic Sitting Balance Sitting balance - Comments: able to reach to feet, not don socks sitting EOB Balance Overall balance assessment: Needs assistance Sitting-balance support: Feet supported, No upper extremity supported Sitting balance-Leahy Scale: Fair Sitting balance - Comments: able to reach to feet, not don socks sitting EOB Standing balance support: Bilateral upper extremity supported, During functional activity Standing balance-Leahy Scale: Poor Standing balance comment: reliant on BUE support for stability with gait    Special needs/care consideration Oxygen 3L in hospital and Diabetic management yes     Previous Home Environment (from acute therapy documentation) Living Arrangements: Alone Available Help at Discharge:  (grandaughter lives nearby but only comes over when called; girlfriend  lives 2 minutes away but doesnt come over everyday) Type of Home: [place]: One level Bathroom Shower/Tub: Chiropodist: Handicapped [place]: No  Discharge Living Setting Plans for Discharge Living Setting: Patient's home Type of Home at Discharge: Apartment Discharge Home Layout: One level Discharge Home Access: Level entry Discharge Bathroom Shower/Tub: Tub/shower unit Discharge Bathroom Toilet: Handicapped height Discharge Bathroom Accessibility: Yes How Accessible: Accessible via wheelchair Does the patient have any problems obtaining your medications?: Yes (Describe) (Medicare Part A only)  Social/Family/Support Systems Anticipated Caregiver: Marjie Skiff (dtr), Jen Mow (grand dtr) Anticipated Caregiver's Contact Information: [name] [phone]; Roanna Epley [phone] Ability/Limitations of Caregiver: min guard Caregiver Availability: 24/7 Discharge Plan Discussed with Primary Caregiver:  Yes Is Caregiver In Agreement with Plan?: Yes Does Caregiver/Family have Issues with Lodging/Transportation while Pt is in Rehab?: No   Goals Patient/Family Goal for Rehab: PT/OT supervision, SLP supervision to min Expected length of stay: 14-18 days Pt/Family Agrees to Admission and willing to participate: Yes Program Orientation Provided & Reviewed with Pt/Caregiver Including Roles  & Responsibilities: Yes  Barriers to Discharge:  (poor family support (?) prior to admission)   Decrease burden of Care through IP rehab admission: n/a  Possible need for SNF placement upon discharge: Not anticipated   Patient Condition: This patient's medical and functional status has changed since the consult dated: 11/11 in which the Rehabilitation Physician determined and documented that the patient's condition is appropriate for intensive rehabilitative care in an inpatient rehabilitation facility. See "History of Present Illness" (above) for medical update. Functional changes are: pt min assist for up to 20',  Mod to max assist for ADLs. Patient's medical and functional status update has been discussed with the Rehabilitation physician and patient remains appropriate for inpatient rehabilitation. Will admit to inpatient rehab today.  Preadmission Screen Completed By:  Michel Santee, PT, DPT 06/25/2020 1:38 PM ______________________________________________________________________   Discussed status with Dr. Ranell Patrick on 06/25/20 at 1:38 PM  and received approval for admission today.  Admission Coordinator:  Michel Santee, PT, DPT time 1:38 PM Sudie Grumbling 06/25/20

## 2020-06-22 NOTE — Progress Notes (Signed)
Neurosurgery (Dr. Reatha Armour) paged at [facility]. Awaiting call back. Patient has had 3 episodes of vomiting since 0036. Patient given ondansetron after first episode. Patient given promethazine after second episode. Patient had third episode. No PRNs available to give at this time. Patient still following commands, moving all extremities, and fully oriented. Patient denies headache. Will continue to monitor.

## 2020-06-23 LAB — GLUCOSE, CAPILLARY
Glucose-Capillary: 124 mg/dL — ABNORMAL HIGH (ref 70–99)
Glucose-Capillary: 153 mg/dL — ABNORMAL HIGH (ref 70–99)
Glucose-Capillary: 182 mg/dL — ABNORMAL HIGH (ref 70–99)
Glucose-Capillary: 136 mg/dL — ABNORMAL HIGH (ref 70–99)

## 2020-06-23 NOTE — Progress Notes (Signed)
Subjective: Patient reports no HAs  Objective: Vital signs in last 24 hours: Temp:  [97.8 F (36.6 C)-100 F (37.8 C)] 98 F (36.7 C) (11/13 0900) Pulse Rate:  [70-91] 82 (11/13 0900) Resp:  [15-26] 26 (11/13 0900) BP: (96-139)/(58-78) 127/60 (11/13 0900) SpO2:  [95 %-100 %] 100 % (11/13 0900)  Intake/Output from previous day: 11/12 0701 - 11/13 0700 In: 361.8 [P.O.:240; I.V.:121.8] Out: -  Intake/Output this shift: Total I/O In: 240 [P.O.:240] Out: -  No acute distress Follows commands x4 Oriented x3 Speech fluent Incision stapled, clean dry and intact  Lab Results: No results for input(s): WBC, HGB, HCT, PLT in the last 72 hours. BMET No results for input(s): NA, K, CL, CO2, GLUCOSE, BUN, CREATININE, CALCIUM in the last 72 hours.  Studies/Results: No results found.  Assessment/Plan: Status post craniotomy for subdural evacuation -Plan for likely CIR in the upcoming days   Brett Williamson 06/23/2020, 11:44 AM

## 2020-06-23 NOTE — Progress Notes (Signed)
Physical Therapy Treatment Patient Details Name: Brett Williamson MRN: [id number] DOB: [birth year] Today's Date: 06/23/2020    History of Present Illness Pt is an 79 y/o M presenting to the ED on 11/9 after having 4 falls on 11/7 and then subsequent L sided weakness to the point where he couldn't get out of his chair. On 11/9 a craniotomy was performed for a R Subdural hematoma. PMH includes: HTN and DM II.    PT Comments    The pt is continuing to make progress with mobility and PT goals at this time. He was able to complete multiple transfers and short ambulation during today's session, but continues to require assist to coordinate movements and strength for bed mobility, and minA to steady/navigate with gait. The pt was able to complete a series of exercises for LE strengthening and ROM, but was limited in tolerance for standing exercises due to significant pain and stiffness (chronic, PTA) in bilateral knees. The pt remains highly motivated, and will benefit from intensive therapies to facilitate return to prior level of independence and mobility.   Follow Up Recommendations  CIR     Equipment Recommendations   (defer to post acute)    Recommendations for Other Services       Precautions / Restrictions Precautions Precautions: Fall Restrictions Weight Bearing Restrictions: No    Mobility  Bed Mobility Overal bed mobility: Needs Assistance Bed Mobility: Supine to Sit     Supine to sit: Mod assist     General bed mobility comments: modA with flat bed, pt able to move BLE to EOB but requires increased time, use of rail, and modA to raise trunk from [facility]  Transfers Overall transfer level: Needs assistance Equipment used: Rolling walker (2 wheeled) Transfers: Sit to/from [facility] Sit to Stand: Min assist Stand pivot transfers: Min assist       General transfer comment: modA initially, but able to complete x3 in a row at end of session with minA and verbal  cues. but requires ~3 min to complete   Ambulation/Gait Ambulation/Gait assistance: Min assist Gait Distance (Feet): 15 Feet Assistive device: Rolling walker (2 wheeled) Gait Pattern/deviations: Step-through pattern;Decreased stride length;Trunk flexed Gait velocity: decreased Gait velocity interpretation: <1.31 ft/sec, indicative of household ambulator General Gait Details: pt able to walk in room with RW and minA for line managment/stability. No LOB during session.     Modified Rankin (Stroke Patients Only) Modified Rankin (Stroke Patients Only) Pre-Morbid Rankin Score: Slight disability Modified Rankin: Moderately severe disability     Balance Overall balance assessment: Needs assistance Sitting-balance support: Feet supported;No upper extremity supported Sitting balance-Leahy Scale: Fair Sitting balance - Comments: able to reach to feet, not don socks sitting EOB   Standing balance support: Bilateral upper extremity supported;During functional activity Standing balance-Leahy Scale: Poor Standing balance comment: reliant on BUE support for stability with gait                            Cognition Arousal/Alertness: Awake/alert Behavior During Therapy: WFL for tasks assessed/performed Overall Cognitive Status: Within Functional Limits for tasks assessed                                 General Comments: WFL for basic info and tasks, pt able to follow instructions given and navigate around room without cues/assist.       Exercises General Exercises -  Lower Extremity Long Arc Quad: AROM;Both;10 reps;Seated (against min resistance knee flexion and extension) Heel Raises: AROM;Both;15 reps;Seated (attempted in standing, too painful on pt's knees) Other Exercises Other Exercises: attempted 5xsts, pt only able tocomplete 3 at this time. ~3 min to complete    General Comments General comments (skin integrity, edema, etc.): bilateral LE discoloration  from DM II      Pertinent Vitals/Pain Pain Assessment: No/denies pain           PT Goals (current goals can now be found in the care plan section) Acute Rehab PT Goals Patient Stated Goal: to get back to normal  Progress towards PT goals: Progressing toward goals    Frequency    Min 4X/week      PT Plan Current plan remains appropriate       AM-PAC PT "6 Clicks" Mobility   Outcome Measure  Help needed turning from your back to your side while in a flat bed without using bedrails?: A Little Help needed moving from lying on your back to sitting on the side of a flat bed without using bedrails?: A Lot Help needed moving to and from a bed to a chair (including a wheelchair)?: A Little Help needed standing up from a chair using your arms (e.g., wheelchair or bedside chair)?: A Little Help needed to walk in hospital room?: A Little Help needed climbing 3-5 steps with a railing? : Total 6 Click Score: 15    End of Session Equipment Utilized During Treatment: Gait belt Activity Tolerance: Patient tolerated treatment well Patient left: in chair;with call bell/phone within reach;with chair alarm set Nurse Communication: Mobility status PT Visit Diagnosis: Unsteadiness on feet (R26.81);Muscle weakness (generalized) (M62.81);Difficulty in walking, not elsewhere classified (R26.2)     Time: 9191-6606 PT Time Calculation (min) (ACUTE ONLY): 22 min  Charges:  $Therapeutic Exercise: 8-22 mins                     Karma Ganja, PT, DPT   Acute Rehabilitation Department Pager #: [phone]   Otho Bellows 06/23/2020, 5:10 PM

## 2020-06-24 ENCOUNTER — Encounter (HOSPITAL_COMMUNITY): Payer: Self-pay | Admitting: Neurosurgery

## 2020-06-24 LAB — GLUCOSE, CAPILLARY
Glucose-Capillary: 150 mg/dL — ABNORMAL HIGH (ref 70–99)
Glucose-Capillary: 153 mg/dL — ABNORMAL HIGH (ref 70–99)
Glucose-Capillary: 160 mg/dL — ABNORMAL HIGH (ref 70–99)
Glucose-Capillary: 182 mg/dL — ABNORMAL HIGH (ref 70–99)

## 2020-06-24 NOTE — Progress Notes (Signed)
Patient ID: Brett Williamson, male   DOB: 1940/08/29, 79 y.o.   MRN: [id number] BP 121/79 (BP Location: Left Arm)   Pulse 71   Temp 98.4 F (36.9 C)   Resp 20   Ht 5\' 11"  (1.803 m)   Wt 120.7 kg   SpO2 98%   BMI 37.11 kg/m  Alert and oriented x 4, speech is clear and fluent Symmetric facies, tongue and uvula are midline Hearing intact to voice Moving all extremities Wound is clean, dry, without signs of infection Doing well

## 2020-06-25 ENCOUNTER — Encounter (HOSPITAL_COMMUNITY): Payer: Self-pay | Admitting: Physical Medicine & Rehabilitation

## 2020-06-25 ENCOUNTER — Inpatient Hospital Stay (HOSPITAL_COMMUNITY)
Admission: RE | Admit: 2020-06-25 | Discharge: 2020-07-07 | DRG: 945 | Disposition: A | Discharge status: Home Health | Payer: Medicare Other | Source: Intra-hospital | Attending: Physical Medicine & Rehabilitation | Admitting: Physical Medicine & Rehabilitation

## 2020-06-25 ENCOUNTER — Other Ambulatory Visit: Payer: Self-pay

## 2020-06-25 DIAGNOSIS — I10 Essential (primary) hypertension: Secondary | ICD-10-CM | POA: Diagnosis present

## 2020-06-25 DIAGNOSIS — N179 Acute kidney failure, unspecified: Secondary | ICD-10-CM | POA: Diagnosis not present

## 2020-06-25 DIAGNOSIS — E871 Hypo-osmolality and hyponatremia: Secondary | ICD-10-CM | POA: Diagnosis not present

## 2020-06-25 DIAGNOSIS — E119 Type 2 diabetes mellitus without complications: Secondary | ICD-10-CM | POA: Diagnosis present

## 2020-06-25 DIAGNOSIS — N390 Urinary tract infection, site not specified: Secondary | ICD-10-CM | POA: Diagnosis present

## 2020-06-25 DIAGNOSIS — I129 Hypertensive chronic kidney disease with stage 1 through stage 4 chronic kidney disease, or unspecified chronic kidney disease: Secondary | ICD-10-CM | POA: Diagnosis present

## 2020-06-25 DIAGNOSIS — I878 Other specified disorders of veins: Secondary | ICD-10-CM | POA: Diagnosis present

## 2020-06-25 DIAGNOSIS — I872 Venous insufficiency (chronic) (peripheral): Secondary | ICD-10-CM | POA: Diagnosis present

## 2020-06-25 DIAGNOSIS — L89621 Pressure ulcer of left heel, stage 1: Secondary | ICD-10-CM | POA: Diagnosis present

## 2020-06-25 DIAGNOSIS — L8990 Pressure ulcer of unspecified site, unspecified stage: Secondary | ICD-10-CM | POA: Diagnosis present

## 2020-06-25 DIAGNOSIS — S065X0D Traumatic subdural hemorrhage without loss of consciousness, subsequent encounter: Secondary | ICD-10-CM | POA: Diagnosis present

## 2020-06-25 DIAGNOSIS — N189 Chronic kidney disease, unspecified: Secondary | ICD-10-CM | POA: Diagnosis present

## 2020-06-25 DIAGNOSIS — E1165 Type 2 diabetes mellitus with hyperglycemia: Secondary | ICD-10-CM | POA: Diagnosis not present

## 2020-06-25 DIAGNOSIS — R296 Repeated falls: Secondary | ICD-10-CM | POA: Diagnosis present

## 2020-06-25 DIAGNOSIS — E1122 Type 2 diabetes mellitus with diabetic chronic kidney disease: Secondary | ICD-10-CM | POA: Diagnosis present

## 2020-06-25 DIAGNOSIS — L89151 Pressure ulcer of sacral region, stage 1: Secondary | ICD-10-CM | POA: Diagnosis present

## 2020-06-25 DIAGNOSIS — B9689 Other specified bacterial agents as the cause of diseases classified elsewhere: Secondary | ICD-10-CM | POA: Diagnosis present

## 2020-06-25 DIAGNOSIS — Z298 Encounter for other specified prophylactic measures: Secondary | ICD-10-CM

## 2020-06-25 DIAGNOSIS — W19XXXD Unspecified fall, subsequent encounter: Secondary | ICD-10-CM | POA: Diagnosis present

## 2020-06-25 DIAGNOSIS — S065X9S Traumatic subdural hemorrhage with loss of consciousness of unspecified duration, sequela: Secondary | ICD-10-CM

## 2020-06-25 DIAGNOSIS — E1140 Type 2 diabetes mellitus with diabetic neuropathy, unspecified: Secondary | ICD-10-CM | POA: Diagnosis present

## 2020-06-25 DIAGNOSIS — N136 Pyonephrosis: Secondary | ICD-10-CM | POA: Diagnosis present

## 2020-06-25 DIAGNOSIS — S065XAA Traumatic subdural hemorrhage with loss of consciousness status unknown, initial encounter: Secondary | ICD-10-CM | POA: Diagnosis present

## 2020-06-25 DIAGNOSIS — S065X9A Traumatic subdural hemorrhage with loss of consciousness of unspecified duration, initial encounter: Secondary | ICD-10-CM | POA: Diagnosis present

## 2020-06-25 DIAGNOSIS — S065X0A Traumatic subdural hemorrhage without loss of consciousness, initial encounter: Secondary | ICD-10-CM

## 2020-06-25 DIAGNOSIS — Z8679 Personal history of other diseases of the circulatory system: Secondary | ICD-10-CM

## 2020-06-25 HISTORY — DX: Type 2 diabetes mellitus without complications: E11.9

## 2020-06-25 HISTORY — DX: Essential (primary) hypertension: I10

## 2020-06-25 HISTORY — DX: Repeated falls: R29.6

## 2020-06-25 LAB — GLUCOSE, CAPILLARY
Glucose-Capillary: 136 mg/dL — ABNORMAL HIGH (ref 70–99)
Glucose-Capillary: 160 mg/dL — ABNORMAL HIGH (ref 70–99)
Glucose-Capillary: 177 mg/dL — ABNORMAL HIGH (ref 70–99)
Glucose-Capillary: 206 mg/dL — ABNORMAL HIGH (ref 70–99)

## 2020-06-25 MED ORDER — SODIUM CHLORIDE 0.9 % IV SOLN
1.0000 g | INTRAVENOUS | Status: DC
  Administered 2020-06-25 – 2020-06-26 (×2): 1 g via INTRAVENOUS
  Filled 2020-06-25: qty 1
  Filled 2020-06-25: qty 10
  Filled 2020-06-25: qty 1

## 2020-06-25 MED ORDER — ONDANSETRON HCL 4 MG PO TABS: 4.0000 mg | ORAL_TABLET | ORAL | Status: DC | PRN

## 2020-06-25 MED ORDER — ACETAMINOPHEN 325 MG PO TABS
650.0000 mg | ORAL_TABLET | ORAL | Status: DC | PRN
  Administered 2020-06-25 – 2020-07-07 (×10): 650 mg via ORAL
  Filled 2020-06-25 (×11): qty 2

## 2020-06-25 MED ORDER — ACETAMINOPHEN 650 MG RE SUPP: 650.0000 mg | RECTAL | Status: DC | PRN

## 2020-06-25 MED ORDER — HYDROCODONE-ACETAMINOPHEN 5-325 MG PO TABS: 1.0000 | ORAL_TABLET | ORAL | Status: DC | PRN

## 2020-06-25 MED ORDER — DOCUSATE SODIUM 100 MG PO CAPS
100.0000 mg | ORAL_CAPSULE | Freq: Two times a day (BID) | ORAL | Status: DC
  Administered 2020-06-26 – 2020-07-03 (×3): 100 mg via ORAL
  Filled 2020-06-25 (×20): qty 1

## 2020-06-25 MED ORDER — SENNOSIDES-DOCUSATE SODIUM 8.6-50 MG PO TABS: 1.0000 | ORAL_TABLET | Freq: Every evening | ORAL | Status: DC | PRN

## 2020-06-25 MED ORDER — BISACODYL 10 MG RE SUPP: 10.0000 mg | Freq: Every day | RECTAL | Status: DC | PRN

## 2020-06-25 MED ORDER — ONDANSETRON HCL 4 MG/2ML IJ SOLN: 4.0000 mg | INTRAMUSCULAR | Status: DC | PRN

## 2020-06-25 MED ORDER — LEVETIRACETAM 500 MG PO TABS
500.0000 mg | ORAL_TABLET | Freq: Two times a day (BID) | ORAL | Status: DC
  Administered 2020-06-25 – 2020-07-05 (×20): 500 mg via ORAL
  Filled 2020-06-25 (×21): qty 1

## 2020-06-25 MED ORDER — GERHARDT'S BUTT CREAM
TOPICAL_CREAM | Freq: Three times a day (TID) | CUTANEOUS | Status: DC
  Administered 2020-07-03: 1 via TOPICAL
  Filled 2020-06-25 (×2): qty 1

## 2020-06-25 MED ORDER — INSULIN ASPART 100 UNIT/ML ~~LOC~~ SOLN
0.0000 [IU] | Freq: Three times a day (TID) | SUBCUTANEOUS | Status: DC
  Administered 2020-06-25 – 2020-06-26 (×3): 2 [IU] via SUBCUTANEOUS
  Administered 2020-06-26: 1 [IU] via SUBCUTANEOUS
  Administered 2020-06-27 (×2): 2 [IU] via SUBCUTANEOUS
  Administered 2020-06-27: 1 [IU] via SUBCUTANEOUS
  Administered 2020-06-28: 2 [IU] via SUBCUTANEOUS
  Administered 2020-06-28 (×2): 1 [IU] via SUBCUTANEOUS
  Administered 2020-06-29: 2 [IU] via SUBCUTANEOUS
  Administered 2020-06-29: 1 [IU] via SUBCUTANEOUS
  Administered 2020-06-29: 2 [IU] via SUBCUTANEOUS
  Administered 2020-06-30 – 2020-07-01 (×3): 1 [IU] via SUBCUTANEOUS
  Administered 2020-07-01 – 2020-07-02 (×2): 2 [IU] via SUBCUTANEOUS
  Administered 2020-07-02 – 2020-07-03 (×3): 1 [IU] via SUBCUTANEOUS
  Administered 2020-07-03: 2 [IU] via SUBCUTANEOUS
  Administered 2020-07-03 – 2020-07-04 (×3): 1 [IU] via SUBCUTANEOUS
  Administered 2020-07-04: 2 [IU] via SUBCUTANEOUS
  Administered 2020-07-05 (×2): 1 [IU] via SUBCUTANEOUS
  Administered 2020-07-05: 2 [IU] via SUBCUTANEOUS
  Administered 2020-07-06: 1 [IU] via SUBCUTANEOUS
  Administered 2020-07-06 (×2): 2 [IU] via SUBCUTANEOUS
  Administered 2020-07-07: 1 [IU] via SUBCUTANEOUS

## 2020-06-25 MED ORDER — PANTOPRAZOLE SODIUM 40 MG PO TBEC
40.0000 mg | DELAYED_RELEASE_TABLET | Freq: Every day | ORAL | Status: DC
  Administered 2020-06-25 – 2020-07-06 (×12): 40 mg via ORAL
  Filled 2020-06-25 (×12): qty 1

## 2020-06-25 NOTE — H&P (Addendum)
Physical Medicine and Rehabilitation Admission H&P  CC: TBI  HPI: Brett Williamson is an 79 year old right-handed male with history of hypertension, diabetes mellitus.  Per chart review patient lives alone independent prior to admission using a Rollator.  Apartment with one level.  He has a granddaughter girlfriend in the area that check on him routinely.  Presented 06/19/2020 with reports of frequent falls as well as generalized weakness.  No reported loss of consciousness.  CT the head showed moderately large right-sided subdural hematoma with 8 mm leftward midline shift.  Admission chemistry sodium 130 glucose 177 BUN 29 creatinine 1.54 CK 86 hemoglobin 15.3, WBC 14,800, urine culture greater than 100,000 gram-negative rods placed on Rocephin, lactic acid 1.4.  Patient underwent craniotomy hematoma evacuation 06/19/2020 per Dr. Vertell Limber.  Follow-up MRI 06/21/2020 showed interval decompression of subdural hematoma with significantly improved mass-effect.  Midline shift decreased to 3 mm.  Echocardiogram ejection fraction 55 to 60% no wall motion abnormalities grade 2 diastolic dysfunction.  Maintained on Keppra for seizure prophylaxis.  Wound care nurse follow-up for multiple wounds on scrotum buttocks perineum wound care as directed.  Therapy evaluations completed and patient was admitted for a comprehensive rehab program.  Review of Systems  Constitutional: Positive for malaise/fatigue. Negative for chills and fever.  HENT: Negative for hearing loss.   Eyes: Negative for blurred vision and double vision.  Respiratory: Negative for cough and shortness of breath.   Cardiovascular: Negative for chest pain, palpitations and leg swelling.  Gastrointestinal: Positive for constipation. Negative for heartburn, nausea and vomiting.  Genitourinary: Positive for urgency. Negative for dysuria and hematuria.  Musculoskeletal: Positive for falls and myalgias.  Skin: Negative for rash.  Neurological: Positive for  weakness and headaches.  All other systems reviewed and are negative.  Past Medical History:  Diagnosis Date  . Diabetes ([place])   . Frequent falls   . HTN (hypertension)    Past Surgical History:  Procedure Laterality Date  . CRANIOTOMY Right 06/19/2020   Procedure: CRANIOTOMY HEMATOMA EVACUATION SUBDURAL;  Surgeon: Erline Levine, MD;  Location: [place];  Service: Neurosurgery;  Laterality: Right;   History reviewed. No pertinent family history. Social History:  reports that he has never smoked. He has never used smokeless tobacco. He reports that he does not drink alcohol and does not use drugs. Allergies: No Known Allergies Medications Prior to Admission  Medication Sig Dispense Refill  . benazepril (LOTENSIN) 20 MG tablet Take 20 mg by mouth daily.    . hydrochlorothiazide (HYDRODIURIL) 25 MG tablet Take 25 mg by mouth daily.    Marland Kitchen ibuprofen (ADVIL) 200 MG tablet Take 200 mg by mouth every 4 (four) hours as needed for mild pain.      Drug Regimen Review Drug regimen was reviewed and remains appropriate with no significant issues identified  Home: Home Living Family/patient expects to be discharged to:: Private residence Living Arrangements: Alone   Home: [place] expects to be discharged to:: Inpatient rehab Living Arrangements: Alone Available Help at Discharge:  (grandaughter lives nearby but only comes over when called; girlfriend lives 2 minutes away but doesnt come over everyday) Type of Home: Apartment Home Layout: One level Bathroom Shower/Tub: Chiropodist: Handicapped height Home Equipment: Grab bars - tub/shower, Grab bars - toilet, Radiation protection practitioner Equipment: Sock aid   Functional History: Prior Function Level of Independence: Independent with assistive device(s) Comments: Pt reports he uses a sock aid for socks. He has long standing numbness/tingling in fingertips  bil hands.   Heambulates with RW.  He drives.  He  grocery shops sometimes, and daughter and girlfriend assist PRN.     Functional Status:  Mobility: Bed Mobility Overal bed mobility: Needs Assistance Bed Mobility: Supine to Sit Supine to sit: Mod assist General bed mobility comments: modA with flat bed, pt able to move BLE to EOB but requires increased time, use of rail, and modA to raise trunk from [facility] Transfers Overall transfer level: Needs assistance Equipment used: Rolling walker (2 wheeled) Transfers: Sit to/from Stand, [employer] Transfers Sit to Stand: Min assist Stand pivot transfers: Min assist General transfer comment: modA initially, but able to complete x3 in a row at end of session with minA and verbal cues. but requires ~3 min to complete  Ambulation/Gait Ambulation/Gait assistance: Min assist Gait Distance (Feet): 15 Feet Assistive device: Rolling walker (2 wheeled) Gait Pattern/deviations: Step-through pattern, Decreased stride length, Trunk flexed General Gait Details: pt able to walk in room with RW and minA for line managment/stability. No LOB during session.  Gait velocity: decreased Gait velocity interpretation: <1.31 ft/sec, indicative of household ambulator  ADL: ADL Overall ADL's : Needs assistance/impaired Eating/Feeding: Set up, Sitting, Bed level Grooming: Wash/dry hands, Wash/dry face, Oral care, Brushing hair, Set up, Sitting Upper Body Bathing: Minimal assistance, Sitting Lower Body Bathing: Moderate assistance, Sit to/from stand Upper Body Dressing : Moderate assistance, Sitting Lower Body Dressing: Maximal assistance, Sit to/from stand Lower Body Dressing Details (indicate cue type and reason): uses sock aid at home  Toilet Transfer: Moderate assistance, +2 for physical assistance, +2 for safety/equipment, Stand-pivot, BSC, RW Toileting- Clothing Manipulation and Hygiene: Sit to/from stand, Total assistance Toileting - Clothing Manipulation Details (indicate cue type and reason): Pt incontinent  of urine and stool.  Assisted with peri care in standing  Functional mobility during ADLs: Moderate assistance, +2 for physical assistance, +2 for safety/equipment, Rolling walker  Cognition: Cognition Overall Cognitive Status: Within Functional Limits for tasks assessed Orientation Level: Oriented X4 Cognition Arousal/Alertness: Awake/alert Behavior During Therapy: WFL for tasks assessed/performed Overall Cognitive Status: Within Functional Limits for tasks assessed General Comments: WFL for basic info and tasks, pt able to follow instructions given and navigate around room without cues/assist.  Physical Exam: Blood pressure 133/66, pulse 69, temperature 98 F (36.7 C), temperature source Oral, resp. rate 20, weight 117.4 kg, SpO2 98 %. Physical Exam  General: Alert, No apparent distress HEENT: Craniotomy site clean and dry Neck: Supple without JVD or lymphadenopathy Heart: Reg rate and rhythm. No murmurs rubs or gallops Chest: CTA bilaterally without wheezes, rales, or rhonchi; no distress Abdomen: Soft, non-tender, non-distended, bowel sounds positive. Extremities: No clubbing, cyanosis, or edema. Pulses are 2+ Skin: Craniotomy site clean and dry, stage 1 pressure injury on sacrum.  Musculoskeletal:     Cervical back: Normal range of motion. No rigidity.     Comments: 4-/5 in UEs- and equal- biceps, triceps, WE and grip 4-/5 in LEs- HF, KE, and PF- DF 2/5 B/L- and has decreased ROM of DF B/L as well Also has large scar on R 2nd digit- swollen hands, stiff, "chronic".   Skin:    Comments: Venous stasis changes on legs with peeling red skin and many skin tears Also has MASD in pannus, groin, between thighs L foot slightly swollen  Neurological:     Comments: Patient is alert no acute distress.  Oriented to person place and time.  Follows simple commands.  Decreased sensation to light touch in stocking/glove distribution- normal  for him      Results for orders placed or  performed during the hospital encounter of 06/19/20 (from the past 48 hour(s))  Glucose, capillary     Status: Abnormal   Collection Time: 06/23/20  5:10 PM  Result Value Ref Range   Glucose-Capillary 182 (H) 70 - 99 mg/dL    Comment: Glucose reference range applies only to samples taken after fasting for at least 8 hours.  Glucose, capillary     Status: Abnormal   Collection Time: 06/23/20 10:31 PM  Result Value Ref Range   Glucose-Capillary 136 (H) 70 - 99 mg/dL    Comment: Glucose reference range applies only to samples taken after fasting for at least 8 hours.  Glucose, capillary     Status: Abnormal   Collection Time: 06/24/20  8:12 AM  Result Value Ref Range   Glucose-Capillary 150 (H) 70 - 99 mg/dL    Comment: Glucose reference range applies only to samples taken after fasting for at least 8 hours.   Comment 1 Notify RN    Comment 2 Document in Chart   Glucose, capillary     Status: Abnormal   Collection Time: 06/24/20 12:34 PM  Result Value Ref Range   Glucose-Capillary 153 (H) 70 - 99 mg/dL    Comment: Glucose reference range applies only to samples taken after fasting for at least 8 hours.   Comment 1 Notify RN    Comment 2 Document in Chart   Glucose, capillary     Status: Abnormal   Collection Time: 06/24/20  4:42 PM  Result Value Ref Range   Glucose-Capillary 160 (H) 70 - 99 mg/dL    Comment: Glucose reference range applies only to samples taken after fasting for at least 8 hours.   Comment 1 Notify RN    Comment 2 Document in Chart   Glucose, capillary     Status: Abnormal   Collection Time: 06/24/20 10:36 PM  Result Value Ref Range   Glucose-Capillary 182 (H) 70 - 99 mg/dL    Comment: Glucose reference range applies only to samples taken after fasting for at least 8 hours.  Glucose, capillary     Status: Abnormal   Collection Time: 06/25/20  7:36 AM  Result Value Ref Range   Glucose-Capillary 136 (H) 70 - 99 mg/dL    Comment: Glucose reference range applies  only to samples taken after fasting for at least 8 hours.  Glucose, capillary     Status: Abnormal   Collection Time: 06/25/20 12:01 PM  Result Value Ref Range   Glucose-Capillary 160 (H) 70 - 99 mg/dL    Comment: Glucose reference range applies only to samples taken after fasting for at least 8 hours.   No results found.     Medical Problem List and Plan: 1.  Decreased functional mobility with generalized weakness as well as reported multiple falls secondary to traumatic right side subdural hematoma.  Status post craniotomy hematoma evacuation 06/19/2020  -patient may not shower  -ELOS/Goals: S 2-3 weeks 2.  Antithrombotics: -DVT/anticoagulation: SCDs.  -antiplatelet therapy: N/A 3. Pain Management: Hydrocodone as needed. Pain is currently well controlled.  4. Mood: Provide emotional support  -antipsychotic agents: N/A 5. Neuropsych: This patient is capable of making decisions on his own behalf. 6. Skin/Wound Care: Gerhardt Butt cream applied to buttocks scrotum perineal area after cleansing and drying 7. Fluids/Electrolytes/Nutrition: Routine in and outs with follow-up chemistries 8.  Seizure prophylaxis.  Continue Keppra 500 mg twice daily 9.  Gram-negative rod UTI.  Rocephin started 06/20/2020 10.  History of hypertension.  Patient on Lotensin 20 mg daily and HCTZ 25 mg daily prior to admission.  Resume as needed. Currently well controlled 11.  Diet-controlled diabetes mellitus.  Hemoglobin A1c 6.6.  SSI.  Helyn Numbers, PA-C  I have personally performed a face to face diagnostic evaluation, including, but not limited to relevant history and physical exam findings, of this patient and developed relevant assessment and plan.  Additionally, I have reviewed and concur with the physician assistant's documentation above.  Leeroy Cha, MD

## 2020-06-25 NOTE — Care Management Important Message (Signed)
Important Message  Patient Details  Name: Brett Williamson MRN: [id number] Date of Birth: Aug 02, 1941   Medicare Important Message Given:  Yes     Selen Smucker Montine Circle 06/25/2020, 3:31 PM

## 2020-06-25 NOTE — Progress Notes (Signed)
Occupational Therapy Treatment Patient Details Name: Brett Williamson MRN: [id number] DOB: 1941/01/22 Today's Date: 06/25/2020    History of present illness Pt is an 79 y/o M presenting to the ED on 11/9 after having 4 falls on 11/7 and then subsequent L sided weakness to the point where he couldn't get out of his chair. On 11/9 a craniotomy was performed for a R Subdural hematoma. PMH includes: HTN and DM II.   OT comments  Pt presents supine in bed pleasant and willing to participate in therapy session. Pt tolerating OOB to recliner and short distance mobility in room. He continues to require up to [facility] for sit<>stand transitions, performing mobility at [facility] level. Pt continues to require up to maxA for LB ADL given difficulty reaching LEs. He remains motivated to return to his PLOF. Feel CIR recommendation is appropriate at this time. Will continue to follow acutely.   Follow Up Recommendations  CIR;Supervision/Assistance - 24 hour    Equipment Recommendations  None recommended by OT          Precautions / Restrictions Precautions Precautions: Fall Restrictions Weight Bearing Restrictions: No       Mobility Bed Mobility Overal bed mobility: Needs Assistance Bed Mobility: Supine to Sit     Supine to sit: Min assist     General bed mobility comments: trunk elevation  Transfers Overall transfer level: Needs assistance Equipment used: Rolling walker (2 wheeled) Transfers: Sit to/from [facility] Sit to Stand: Mod assist         General transfer comment: pt requiring increased boosting assist and use of momentum to rise to stand; VCs for safe hand placement; minA for static standing once upright. performed x2 sit<>stand     Balance Overall balance assessment: Needs assistance Sitting-balance support: Feet supported;No upper extremity supported Sitting balance-Leahy Scale: Fair     Standing balance support: Bilateral upper extremity supported;During  functional activity Standing balance-Leahy Scale: Poor Standing balance comment: reliant on BUE support                            ADL either performed or assessed with clinical judgement   ADL Overall ADL's : Needs assistance/impaired Eating/Feeding: Set up;Sitting Eating/Feeding Details (indicate cue type and reason): to open containers given reduced fine motor capabilities          Lower Body Bathing: Moderate assistance;Sit to/from stand   Upper Body Dressing : Minimal assistance;Sitting Upper Body Dressing Details (indicate cue type and reason): new gown Lower Body Dressing: Maximal assistance;Sit to/from stand Lower Body Dressing Details (indicate cue type and reason): for socks        Toileting - Clothing Manipulation Details (indicate cue type and reason): pt with urgency to urinate once in standing and ultimately incontinent on floor, required assist for clean up/pericare      Functional mobility during ADLs: Moderate assistance;Minimal assistance;Rolling walker (modA to stand; minA for steps using RW)                         Cognition Arousal/Alertness: Awake/alert Behavior During Therapy: WFL for tasks assessed/performed Overall Cognitive Status: No family/caregiver present to determine baseline cognitive functioning                                 General Comments: conversationally appropriate today, oriented and good self awareness  Exercises     Shoulder Instructions       General Comments      Pertinent Vitals/ Pain       Pain Assessment: Faces Faces Pain Scale: Hurts a little bit Pain Location: bil LEs  Home Living                                          Prior Functioning/Environment              Frequency  Min 2X/week        Progress Toward Goals  OT Goals(current goals can now be found in the care plan section)  Progress towards OT goals: Progressing toward  goals  Acute Rehab OT Goals Patient Stated Goal: to get back to normal  OT Goal Formulation: With patient Time For Goal Achievement: 07/06/20 Potential to Achieve Goals: Good  Plan Discharge plan remains appropriate    Co-evaluation                 AM-PAC OT "6 Clicks" Daily Activity     Outcome Measure   Help from another person eating meals?: A Little Help from another person taking care of personal grooming?: A Little Help from another person toileting, which includes using toliet, bedpan, or urinal?: A Lot Help from another person bathing (including washing, rinsing, drying)?: A Lot Help from another person to put on and taking off regular upper body clothing?: A Lot Help from another person to put on and taking off regular lower body clothing?: A Lot 6 Click Score: 14    End of Session Equipment Utilized During Treatment: Gait belt;Rolling walker  OT Visit Diagnosis: Unsteadiness on feet (R26.81)   Activity Tolerance Patient tolerated treatment well   Patient Left in chair;with call bell/phone within reach;with chair alarm set   Nurse Communication Mobility status        Time: [phone] OT Time Calculation (min): 26 min  Charges: OT General Charges $OT Visit: 1 Visit OT Treatments $Self Care/Home Management : 23-37 mins  Lou Cal, OT Acute Rehabilitation Services Pager [phone] Office [phone]    Brett Williamson 06/25/2020, 4:05 PM

## 2020-06-25 NOTE — Progress Notes (Signed)
Inpatient Rehab Admissions Coordinator:   I have a bed available for pt to admit to CIR today.  Awaiting confirmation from Dr. Christella Noa that pt is ready to admit.  Shann Medal, PT, DPT Admissions Coordinator [phone] 06/25/20  10:45 AM

## 2020-06-25 NOTE — Progress Notes (Signed)
Inpatient Rehab Admissions Coordinator:   Dr. Christella Noa confirmed pt was ready to admit today.  Will let pt/family, and TOC team know.   Shann Medal, PT, DPT Admissions Coordinator [phone] 06/25/20  1:36 PM

## 2020-06-25 NOTE — Progress Notes (Signed)
Inpatient Rehabilitation Medication Review by a Pharmacist  A complete drug regimen review was completed for this patient to identify any potential clinically significant medication issues.  Clinically significant medication issues were identified:  No  Check AMION for pharmacist assigned to patient if future medication questions/issues arise during this admission.  Time spent performing this drug regimen review (minutes):  [place], PharmD, BCPS, [facility] Clinical Pharmacist 06/25/2020 3:09 PM

## 2020-06-25 NOTE — Progress Notes (Signed)
PMR Admission Coordinator Pre-Admission Assessment   Patient: Brett Williamson is an 79 y.o., male MRN: [id number] DOB: 1941-07-30 Height: 5\' 11"  (180.3 cm) Weight: 120.7 kg                                                                                                                                                  Insurance Information HMO:     PPO:      PCP:      IPA:      80/20:      OTHER:  PRIMARY: Medicare Part A only      Policy#: [id number]      Subscriber: pt CM Name:       Phone#:      Fax#:  Pre-Cert#: verified Civil engineer, contracting:  Benefits:  Phone #:     Name:  Eff. Date: Part A 10/09/04     Deduct: $1484      Out of Pocket Max: n/a      Life Max: n/a  CIR: per Medicare Guidelines      SNF: 20 full days Outpatient: no benefits     Co-Pay:  Home Health: 100% per Medicare guidelines      Co-Pay:  DME: no benefits     Co-Pay:  Providers:  SECONDARY:       Policy#:       Phone#:    Development worker, community:       Phone#:    The Therapist, art Information Summary" for patients in Inpatient Rehabilitation Facilities with attached "Privacy Act [place] Records" was provided and verbally reviewed with: Patient   Emergency Contact Information         Contact Information     Name Relation Home Work Mobile    hodges,serena Granddaughter     [phone]    hodges,catherine Daughter     [phone]       Current Medical History  Patient Admitting Diagnosis: fall with R SDH, s/p craniotomy for evacuation    History of Present Illness: [name] is a 79 y.o. right-handed male with history of hypertension, diabetes mellitus.  Presented 06/19/2020 with report frequent falls as well as generalized weakness.  No reported loss of consciousness.  CT of the head showed moderately large right-sided subdural hematoma with 8 mm leftward midline shift.  Admission chemistry sodium 130 glucose 177 BUN 29 creatinine 1.54, CK 86, hemoglobin 15.3 WBC 14,800, urine culture greater 100,000  gram-negative rods and placed on Rocephin, lactic acid 1.4.  Patient underwent craniotomy hematoma evacuation on 06/19/2020 per Dr. Vertell Limber.  MRI follow-up 06/21/2020 showed interval decompression of subdural hematoma with significantly improved mass-effect.  Midline shift decreased to 3 mm.  Echocardiogram 06/21/2020 ejection fraction 55 to 60% no wall motion abnormalities grade 2 diastolic dysfunction.  Maintained on Keppra for seizure prophylaxis.  Wound  care nurse follow-up for multiple wounds scrotum buttocks perineum with wound care as directed.  Therapy evaluations completed with recommendations of physical medicine rehab consult. Glasgow Coma Scale Score: 15   Past Medical History  History reviewed. No pertinent past medical history.   Family History  family history is not on file.   Prior Rehab/Hospitalizations:  Has the patient had prior rehab or hospitalizations prior to admission? No   Has the patient had major surgery during 100 days prior to admission? Yes   Current Medications    Current Facility-Administered Medications:  .  0.9 % NaCl with KCl 20 mEq/ L  infusion, , Intravenous, Continuous, Erline Levine, MD, Stopped at 06/22/20 [phone] .  acetaminophen (TYLENOL) tablet 650 mg, 650 mg, Oral, Q4H PRN, 650 mg at 06/25/20 0431 **OR** acetaminophen (TYLENOL) suppository 650 mg, 650 mg, Rectal, Q4H PRN, Erline Levine, MD .  bisacodyl (DULCOLAX) suppository 10 mg, 10 mg, Rectal, Daily PRN, Erline Levine, MD .  cefTRIAXone (ROCEPHIN) 1 g in sodium chloride 0.9 % 100 mL IVPB, 1 g, Intravenous, Q24H, Tat, David, MD, Last Rate: 200 mL/hr at 06/24/20 1457, 1 g at 06/24/20 1457 .  Chlorhexidine Gluconate Cloth 2 % PADS 6 each, 6 each, Topical, Daily, Erline Levine, MD, 6 each at 06/24/20 1026 .  docusate sodium (COLACE) capsule 100 mg, 100 mg, Oral, BID, Erline Levine, MD, 100 mg at 06/24/20 1026 .  Gerhardt's butt cream, , Topical, TID, Erline Levine, MD, 1 application at 54/65/03 1003 .   HYDROcodone-acetaminophen (NORCO/VICODIN) 5-325 MG per tablet 1 tablet, 1 tablet, Oral, Q4H PRN, Erline Levine, MD .  HYDROmorphone (DILAUDID) injection 0.5-1 mg, 0.5-1 mg, Intravenous, Q2H PRN, Erline Levine, MD .  insulin aspart (novoLOG) injection 0-9 Units, 0-9 Units, Subcutaneous, TID WC, Orson Eva, MD, 1 Units at 06/25/20 [phone] .  labetalol (NORMODYNE) injection 10-40 mg, 10-40 mg, Intravenous, Q10 min PRN, Erline Levine, MD .  levETIRAcetam (KEPPRA) tablet 500 mg, 500 mg, Oral, BID, Erline Levine, MD, 500 mg at 06/25/20 1003 .  ondansetron (ZOFRAN) tablet 4 mg, 4 mg, Oral, Q4H PRN **OR** ondansetron (ZOFRAN) injection 4 mg, 4 mg, Intravenous, Q4H PRN, Erline Levine, MD, 4 mg at 06/22/20 0037 .  pantoprazole (PROTONIX) EC tablet 40 mg, 40 mg, Oral, QHS, Erline Levine, MD, 40 mg at 06/24/20 2121 .  prochlorperazine (COMPAZINE) injection 5 mg, 5 mg, Intravenous, Q6H PRN, Dawley, Troy C, DO, 5 mg at 06/22/20 1125 .  promethazine (PHENERGAN) tablet 12.5-25 mg, 12.5-25 mg, Oral, Q4H PRN, Erline Levine, MD, 12.5 mg at 06/22/20 0126 .  senna-docusate (Senokot-S) tablet 1 tablet, 1 tablet, Oral, QHS PRN, Erline Levine, MD .  sodium phosphate (FLEET) 7-19 GM/118ML enema 1 enema, 1 enema, Rectal, Once PRN, Erline Levine, MD   Patients Current Diet:     Diet Order                      Diet Carb Modified Fluid consistency: Thin; Room service appropriate? Yes with Assist  Diet effective now                      Precautions / Restrictions Precautions Precautions: Fall Precaution Comments: R a-line in wrist Restrictions Weight Bearing Restrictions: No    Has the patient had 2 or more falls or a fall with injury in the past year?Yes   Prior Activity Level Limited Community (1-2x/wk): progressing debility over the last several weeks/months (per chart review), used RW at baseline, had  at least 4 falls the day of admission   Prior Functional Level Prior Function Level of Independence:  Independent with assistive device(s) Comments: Pt reports he uses a sock aid for socks. He has long standing numbness/tingling in fingertips bil hands.   Heambulates with RW.  He drives.  He grocery shops sometimes, and daughter and girlfriend assist PRN.      Self Care: Did the patient need help bathing, dressing, using the toilet or eating?  Independent    Indoor Mobility: Did the patient need assistance with walking from room to room (with or without device)? Independent   Stairs: Did the patient need assistance with internal or external stairs (with or without device)? Needed some help   Functional Cognition: Did the patient need help planning regular tasks such as shopping or remembering to take medications? Independent   Home Assistive Devices / Equipment Home Assistive Devices/Equipment: Environmental consultant (specify type) ("rolling walker") Home Equipment: Grab bars - tub/shower, Grab bars - toilet, Adaptive equipment   Prior Device Use: Indicate devices/aids used by the patient prior to current illness, exacerbation or injury? Walker and lift chair   Current Functional Level Cognition   Overall Cognitive Status: Within Functional Limits for tasks assessed Orientation Level: Oriented X4 General Comments: WFL for basic info and tasks, pt able to follow instructions given and navigate around room without cues/assist.     Extremity Assessment (includes Sensation/Coordination)   Upper Extremity Assessment: Generalized weakness, RUE deficits/detail, LUE deficits/detail RUE Deficits / Details: pt with torn rotator cuff, limited shld flex, 3-/5.   PROM WFL.  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  PROM WFL. Athritic deformity bil. hands   LUE Sensation: decreased light touch LUE Coordination: decreased fine motor, decreased gross motor  Lower Extremity Assessment: Defer to PT evaluation     ADLs   Overall ADL's : Needs assistance/impaired Eating/Feeding: Set up, Sitting, Bed level Grooming: Wash/dry hands, Wash/dry face, Oral care, Brushing hair, Set up, Sitting Upper Body Bathing: Minimal assistance, Sitting Lower Body Bathing: Moderate assistance, Sit to/from stand Upper Body Dressing : Moderate assistance, Sitting Lower Body Dressing: Maximal assistance, Sit to/from stand Lower Body Dressing Details (indicate cue type and reason): uses sock aid at home  Toilet Transfer: Moderate assistance, +2 for physical assistance, +2 for safety/equipment, Stand-pivot, BSC, RW Toileting- Clothing Manipulation and Hygiene: Sit to/from stand, Total assistance Toileting - Clothing Manipulation Details (indicate cue type and reason): Pt incontinent of urine and stool.  Assisted with peri care in standing  Functional mobility during ADLs: Moderate assistance, +2 for physical assistance, +2 for safety/equipment, Rolling walker     Mobility   Overal bed mobility: Needs Assistance Bed Mobility: Supine to Sit Supine to sit: Mod assist General bed mobility comments: modA with flat bed, pt able to move BLE to EOB but requires increased time, use of rail, and modA to raise trunk from [facility]     Transfers   Overall transfer level: Needs assistance Equipment used: Rolling walker (2 wheeled) Transfers: Sit to/from Stand, [employer] Transfers Sit to Stand: Min assist Stand pivot transfers: Min assist General transfer comment: modA initially, but able to complete x3 in a row at end of session with minA and verbal cues. but requires ~3 min to complete  Ambulation / Gait / Stairs / Wheelchair Mobility   Ambulation/Gait Ambulation/Gait assistance: Herbalist (Feet): 15 Feet Assistive device: Rolling walker (2 wheeled) Gait  Pattern/deviations: Step-through pattern, Decreased stride length, Trunk flexed General Gait Details: pt able to walk in room with RW and minA for line managment/stability. No LOB during session.  Gait velocity: decreased Gait velocity interpretation: <1.31 ft/sec, indicative of household ambulator     Posture / Balance Dynamic Sitting Balance Sitting balance - Comments: able to reach to feet, not don socks sitting EOB Balance Overall balance assessment: Needs assistance Sitting-balance support: Feet supported, No upper extremity supported Sitting balance-Leahy Scale: Fair Sitting balance - Comments: able to reach to feet, not don socks sitting EOB Standing balance support: Bilateral upper extremity supported, During functional activity Standing balance-Leahy Scale: Poor Standing balance comment: reliant on BUE support for stability with gait     Special needs/care consideration Oxygen 3L in hospital and Diabetic management yes        Previous Home Environment (from acute therapy documentation) Living Arrangements: Alone Available Help at Discharge:  (grandaughter lives nearby but only comes over when called; girlfriend lives 2 minutes away but doesnt come over everyday) Type of Home: Apartment Home Layout: One level Bathroom Shower/Tub: Chiropodist: Handicapped [place]: No   Discharge Living Setting Plans for Discharge Living Setting: Patient's home Type of Home at Discharge: Apartment Discharge Home Layout: One level Discharge Home Access: Level entry Discharge Bathroom Shower/Tub: Tub/shower unit Discharge Bathroom Toilet: Handicapped height Discharge Bathroom Accessibility: Yes How Accessible: Accessible via wheelchair Does the patient have any problems obtaining your medications?: Yes (Describe) (Medicare Part A only)   Social/Family/Support Systems Anticipated Caregiver: Marjie Skiff (dtr), Jen Mow (grand dtr) Anticipated  Caregiver's Contact Information: [name] [phone]; Roanna Epley [phone] Ability/Limitations of Caregiver: min guard Caregiver Availability: 24/7 Discharge Plan Discussed with Primary Caregiver: Yes Is Caregiver In Agreement with Plan?: Yes Does Caregiver/Family have Issues with Lodging/Transportation while Pt is in Rehab?: No     Goals Patient/Family Goal for Rehab: PT/OT supervision, SLP supervision to min Expected length of stay: 14-18 days Pt/Family Agrees to Admission and willing to participate: Yes Program Orientation Provided & Reviewed with Pt/Caregiver Including Roles  & Responsibilities: Yes  Barriers to Discharge:  (poor family support (?) prior to admission)     Decrease burden of Care through IP rehab admission: n/a   Possible need for SNF placement upon discharge: Not anticipated    Patient Condition: This patient's medical and functional status has changed since the consult dated: 11/11 in which the Rehabilitation Physician determined and documented that the patient's condition is appropriate for intensive rehabilitative care in an inpatient rehabilitation facility. See "History of Present Illness" (above) for medical update. Functional changes are: pt min assist for up to 20',  Mod to max assist for ADLs. Patient's medical and functional status update has been discussed with the Rehabilitation physician and patient remains appropriate for inpatient rehabilitation. Will admit to inpatient rehab today.   Preadmission Screen Completed By:  Michel Santee, PT, DPT 06/25/2020 1:38 PM ______________________________________________________________________   Discussed status with Dr. Ranell Patrick on 06/25/20 at 1:38 PM  and received approval for admission today.   Admission Coordinator:  Michel Santee, PT, DPT time 1:38 PM Sudie Grumbling 06/25/20          Cosigned by: Izora Ribas, MD at 06/25/2020  2:35 PM

## 2020-06-25 NOTE — Progress Notes (Signed)
Patient arrive on the unit in a bed. A&O x4, denied pain or discomfort. Educated on plan of care, safety and medication. Assessment done. Bed in lowest position and call bell within reach. All question and concerns addressed. We continue to monitor.

## 2020-06-26 ENCOUNTER — Inpatient Hospital Stay (HOSPITAL_COMMUNITY): Payer: Self-pay

## 2020-06-26 ENCOUNTER — Inpatient Hospital Stay (HOSPITAL_COMMUNITY): Payer: Self-pay | Admitting: Occupational Therapy

## 2020-06-26 ENCOUNTER — Inpatient Hospital Stay (HOSPITAL_COMMUNITY): Payer: Self-pay | Admitting: Speech Pathology

## 2020-06-26 LAB — CBC WITH DIFFERENTIAL/PLATELET
Abs Immature Granulocytes: 0.07 10*3/uL (ref 0.00–0.07)
Basophils Absolute: 0.1 10*3/uL (ref 0.0–0.1)
Basophils Relative: 1 %
Eosinophils Absolute: 0.5 10*3/uL (ref 0.0–0.5)
Eosinophils Relative: 5 %
HCT: 39.3 % (ref 39.0–52.0)
Hemoglobin: 12.9 g/dL — ABNORMAL LOW (ref 13.0–17.0)
Immature Granulocytes: 1 %
Lymphocytes Relative: 16 %
Lymphs Abs: 1.6 10*3/uL (ref 0.7–4.0)
MCH: 26.7 pg (ref 26.0–34.0)
MCHC: 32.8 g/dL (ref 30.0–36.0)
MCV: 81.4 fL (ref 80.0–100.0)
Monocytes Absolute: 1.1 10*3/uL — ABNORMAL HIGH (ref 0.1–1.0)
Monocytes Relative: 12 %
Neutro Abs: 6.2 10*3/uL (ref 1.7–7.7)
Neutrophils Relative %: 65 %
Platelets: 231 10*3/uL (ref 150–400)
RBC: 4.83 MIL/uL (ref 4.22–5.81)
RDW: 14.4 % (ref 11.5–15.5)
WBC: 9.6 10*3/uL (ref 4.0–10.5)
nRBC: 0 % (ref 0.0–0.2)

## 2020-06-26 LAB — GLUCOSE, CAPILLARY
Glucose-Capillary: 163 mg/dL — ABNORMAL HIGH (ref 70–99)
Glucose-Capillary: 148 mg/dL — ABNORMAL HIGH (ref 70–99)
Glucose-Capillary: 192 mg/dL — ABNORMAL HIGH (ref 70–99)
Glucose-Capillary: 163 mg/dL — ABNORMAL HIGH (ref 70–99)

## 2020-06-26 LAB — COMPREHENSIVE METABOLIC PANEL
ALT: 35 U/L (ref 0–44)
AST: 29 U/L (ref 15–41)
Albumin: 2.8 g/dL — ABNORMAL LOW (ref 3.5–5.0)
Alkaline Phosphatase: 81 U/L (ref 38–126)
Anion gap: 10 (ref 5–15)
BUN: 28 mg/dL — ABNORMAL HIGH (ref 8–23)
CO2: 21 mmol/L — ABNORMAL LOW (ref 22–32)
Calcium: 8.7 mg/dL — ABNORMAL LOW (ref 8.9–10.3)
Chloride: 103 mmol/L (ref 98–111)
Creatinine, Ser: 1.45 mg/dL — ABNORMAL HIGH (ref 0.61–1.24)
GFR, Estimated: 49 mL/min — ABNORMAL LOW (ref >60)
Glucose, Bld: 161 mg/dL — ABNORMAL HIGH (ref 70–99)
Potassium: 3.8 mmol/L (ref 3.5–5.1)
Sodium: 134 mmol/L — ABNORMAL LOW (ref 135–145)
Total Bilirubin: 0.8 mg/dL (ref 0.3–1.2)
Total Protein: 6.8 g/dL (ref 6.5–8.1)

## 2020-06-26 NOTE — Progress Notes (Signed)
Inpatient [facility] Individual Statement of Services  Patient Name:  Brett Williamson  Date:  06/26/2020  Welcome to the [facility].  Our goal is to provide you with an individualized program based on your diagnosis and situation, designed to meet your specific needs.  With this comprehensive rehabilitation program, you will be expected to participate in at least 3 hours of rehabilitation therapies Monday-Friday, with modified therapy programming on the weekends.  Your rehabilitation program will include the following services:  Physical Therapy (PT), Occupational Therapy (OT), Speech Therapy (ST), 24 hour per day rehabilitation nursing, Neuropsychology, Care Coordinator, Rehabilitation Medicine, Nutrition Services and Pharmacy Services  Weekly team conferences will be held on Tuesday to discuss your progress.  Your Inpatient Rehabilitation Care Coordinator will talk with you frequently to get your input and to update you on team discussions.  Team conferences with you and your family in attendance may also be held.  Expected length of stay: 2 weeks  Overall anticipated outcome: Supervision level  Depending on your progress and recovery, your program may change. Your Inpatient Rehabilitation Care Coordinator will coordinate services and will keep you informed of any changes. Your Inpatient Rehabilitation Care Coordinator's name and contact numbers are listed  below.  The following services may also be recommended but are not provided by the [facility] will be made to provide these services after discharge if needed.  Arrangements include referral to agencies that provide these services.  Your insurance has been verified to be:  Medicare Part A Your primary doctor is:  Stoney Bang  Pertinent information will be shared with your  doctor and your insurance company.  Inpatient Rehabilitation Care Coordinator:  Cathleen Corti [phone] or [phone]  Information discussed with and copy given to patient by: Elease Hashimoto, 06/26/2020, 2:52 PM

## 2020-06-26 NOTE — Progress Notes (Signed)
Inpatient Rehabilitation  Patient information reviewed and entered into eRehab system by Jailah Willis M. Jelicia Nantz, M.A., CCC/SLP, PPS Coordinator.  Information including medical coding, functional ability and quality indicators will be reviewed and updated through discharge.    

## 2020-06-26 NOTE — Progress Notes (Signed)
[facility] PHYSICAL MEDICINE & REHABILITATION PROGRESS NOTE   Subjective/Complaints: Slept poorly at night. At home he sleeps from 5am to 1pm, discussed that he will be unable to do this here with therapy and he understands. Hopefully he will be tired enough to sleep tonight after today's therapy.  ROS: +BM today  Objective:   No results found. Recent Labs    06/26/20 0549  WBC 9.6  HGB 12.9*  HCT 39.3  PLT 231   Recent Labs    06/26/20 0549  NA 134*  K 3.8  CL 103  CO2 21*  GLUCOSE 161*  BUN 28*  CREATININE 1.45*  CALCIUM 8.7*    Intake/Output Summary (Last 24 hours) at 06/26/2020 1043 Last data filed at 06/26/2020 0400 Gross per 24 hour  Intake 240 ml  Output --  Net 240 ml     Pressure Injury 06/25/20 Sacrum Mid Stage 1 -  Intact skin with non-blanchable redness of a localized area usually over a bony prominence. area to mid-sacrum (Active)  06/25/20 1530  Location: Sacrum  Location Orientation: Mid  Staging: Stage 1 -  Intact skin with non-blanchable redness of a localized area usually over a bony prominence.  Wound Description (Comments): area to mid-sacrum  Present on Admission: Yes     Pressure Injury 06/25/20 Heel Right;Left;Posterior Stage 1 -  Intact skin with non-blanchable redness of a localized area usually over a bony prominence. Right and left heel extremely slow to blanch. Will place foam dressing to both. (Active)  06/25/20 1515  Location: Heel  Location Orientation: Right;Left;Posterior  Staging: Stage 1 -  Intact skin with non-blanchable redness of a localized area usually over a bony prominence.  Wound Description (Comments): Right and left heel extremely slow to blanch. Will place foam dressing to both.  Present on Admission: Yes    Physical Exam: Vital Signs Blood pressure (!) 142/86, pulse 73, temperature 98.3 F (36.8 C), temperature source Oral, resp. rate 18, height 5\' 11"  (1.803 m), weight 117.4 kg, SpO2 97 %. General: Alert and  oriented x 3, No apparent distress HEENT: Head is normocephalic, atraumatic, PERRLA, EOMI, sclera anicteric, oral mucosa pink and moist, dentition intact, ext ear canals clear,  Neck: Supple without JVD or lymphadenopathy Heart: Reg rate and rhythm. No murmurs rubs or gallops Chest: CTA bilaterally without wheezes, rales, or rhonchi; no distress Abdomen: Soft, non-tender, non-distended, bowel sounds positive. Extremities: No clubbing, cyanosis, or edema. Pulses are 2+ Skin: Craniotomy site clean and dry, stage 1 pressure injury on sacrum.  Musculoskeletal:  Cervical back: Normal range of motion. Norigidity.  Comments: 4-/5 in UEs- and equal- biceps, triceps, WE and grip 4-/5 in LEs- HF, KE, and PF- DF 2/5 B/L- and has decreased ROM of DF B/L as well Also has large scar on R 2nd digit- swollen hands, stiff, "chronic". 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 Oriented to person place and time. Follows simple commands.  Decreased sensation to light touch in stocking/glove distribution- normal for him   Assessment/Plan: 1. Functional deficits which require 3+ hours per day of interdisciplinary therapy in a comprehensive inpatient rehab setting.  Physiatrist is providing close team supervision and 24 hour management of active medical problems listed below.  Physiatrist and rehab team continue to assess barriers to discharge/monitor patient progress toward functional and medical goals  Care Tool:  Bathing              Bathing assist       Upper Body Dressing/Undressing Upper body dressing        Upper body assist      Lower Body Dressing/Undressing Lower body dressing      What is the patient wearing?: Incontinence brief     Lower body assist Assist for lower body  dressing: Dependent - Patient 0%     Toileting Toileting    Toileting assist Assist for toileting: Maximal Assistance - Patient 25 - 49%     Transfers Chair/bed transfer  Transfers assist     Chair/bed transfer assist level: Moderate Assistance - Patient 50 - 74%     Locomotion Ambulation   Ambulation assist              Walk 10 feet activity   Assist           Walk 50 feet activity   Assist           Walk 150 feet activity   Assist           Walk 10 feet on uneven surface  activity   Assist           Wheelchair     Assist               Wheelchair 50 feet with 2 turns activity    Assist            Wheelchair 150 feet activity     Assist          Blood pressure (!) 142/86, pulse 73, temperature 98.3 F (36.8 C), temperature source Oral, resp. rate 18, height 5\' 11"  (1.803 m), weight 117.4 kg, SpO2 97 %.  Medical Problem List and Plan: 1.  Decreased functional mobility with generalized weakness as well as reported multiple falls secondary to traumatic right side subdural hematoma.  Status post craniotomy hematoma evacuation 06/19/2020             -patient may not shower             -ELOS/Goals: S 2-3 weeks  -Performed well on cognitive testing 2.  Antithrombotics: -DVT/anticoagulation: SCDs.             -antiplatelet therapy: N/A 3. Pain Management: Hydrocodone as needed. Pain is currently well controlled.   4. Mood: Provide emotional support             -antipsychotic agents: N/A 5. Neuropsych: This patient is capable of making decisions on his own behalf. 6. Skin/Wound Care: Gerhardt Butt cream applied to buttocks scrotum perineal area after cleansing and drying 7. Fluids/Electrolytes/Nutrition: Routine in and outs with follow-up chemistries 8.  Seizure prophylaxis.  Continue Keppra 500 mg twice daily 9.  Gram-negative rod UTI.  Rocephin started 06/20/2020 10.  History of hypertension.  Patient on  Lotensin 20 mg daily and HCTZ 25 mg daily prior to admission.  Resume as needed. Currently slightly elevated. Has been labile- continue to monitor.   11.  Diet-controlled diabetes mellitus.  Hemoglobin A1c 6.6.  SSI.  11/16: CBG elevated to 206 last night. RD consult ordered for education   LOS: 1 days A FACE TO FACE EVALUATION WAS PERFORMED  Clide Deutscher Lyle Leisner 06/26/2020, 10:43 AM

## 2020-06-26 NOTE — Evaluation (Signed)
Physical Therapy Assessment and Plan  Patient Details  Name: Brett Williamson MRN: [id number] Date of Birth: 01/30/1941  PT Diagnosis: Abnormality of gait, Difficulty walking, Edema, Impaired sensation, Muscle weakness and Osteoarthritis Rehab Potential: Good ELOS: 2 weeks   Today's Date: 06/26/2020 PT Individual Time: 1300-1415 PT Individual Time Calculation (min): 75 min    Hospital Problem: Active Problems:   Traumatic subdural hematoma [facility])   Past Medical History:  Past Medical History:  Diagnosis Date  . Diabetes ([place])   . Frequent falls   . HTN (hypertension)    Past Surgical History:  Past Surgical History:  Procedure Laterality Date  . CRANIOTOMY Right 06/19/2020   Procedure: CRANIOTOMY HEMATOMA EVACUATION SUBDURAL;  Surgeon: Erline Levine, MD;  Location: [place];  Service: Neurosurgery;  Laterality: Right;    Assessment & Plan Clinical Impression: Patient is a 79 year old right-handed male with history of hypertension, diabetes mellitus.  Per chart review patient lives alone independent prior to admission using a Rollator.  Apartment with one level.  He has a granddaughter girlfriend in the area that check on him routinely.  Presented 06/19/2020 with reports of frequent falls as well as generalized weakness.  No reported loss of consciousness.  CT the head showed moderately large right-sided subdural hematoma with 8 mm leftward midline shift.  Admission chemistry sodium 130 glucose 177 BUN 29 creatinine 1.54 CK 86 hemoglobin 15.3, WBC 14,800, urine culture greater than 100,000 gram-negative rods placed on Rocephin, lactic acid 1.4.  Patient underwent craniotomy hematoma evacuation 06/19/2020 per Dr. Vertell Limber.  Follow-up MRI 06/21/2020 showed interval decompression of subdural hematoma with significantly improved mass-effect.  Midline shift decreased to 3 mm.  Echocardiogram ejection fraction 55 to 60% no wall motion abnormalities grade 2 diastolic dysfunction.  Maintained on Keppra for  seizure prophylaxis.  Wound care nurse follow-up for multiple wounds on scrotum buttocks perineum wound care as directed.  Therapy evaluations completed and patient was admitted for a comprehensive rehab program. Patient transferred to CIR on 06/25/2020 .   Patient currently requires mod with mobility secondary to muscle weakness, and decreased sitting balance, decreased standing balance and decreased balance strategies.  Prior to hospitalization, patient was modified independent  with mobility and lived with Alone in a [place] home.  Home access is  Level entry, Other (comment) (Treshold entrance).  Patient will benefit from skilled PT intervention to maximize safe functional mobility, minimize fall risk and decrease caregiver burden for planned discharge home with intermittent assist.  Anticipate patient will benefit from follow up [facility] at discharge.  PT - End of Session Activity Tolerance: Tolerates 10 - 20 min activity with multiple rests Endurance Deficit: Yes Endurance Deficit Description: Multiple rest  breaks required for recovery during simple functional mobility tasks PT Assessment Rehab Potential (ACUTE/IP ONLY): Good PT Barriers to Discharge: Decreased caregiver support;Home environment access/layout;Insurance for SNF coverage;Lack of/limited family support;New diabetic;Weight PT Patient demonstrates impairments in the following area(s): Balance;Edema;Endurance;Motor;Safety;Sensory;Skin Integrity PT Transfers Functional Problem(s): Bed Mobility;Bed to Chair;Car;Furniture PT Locomotion Functional Problem(s): Ambulation PT Plan PT Intensity: Minimum of 1-2 x/day ,45 to 90 minutes PT Frequency: 5 out of 7 days PT Duration Estimated Length of Stay: 2 weeks PT Treatment/Interventions: Ambulation/gait training;DME/adaptive equipment instruction;Psychosocial support;UE/LE Strength taining/ROM;UE/LE Coordination activities;Skin care/wound management;Functional electrical  stimulation;Balance/vestibular training;Cognitive remediation/compensation;Functional mobility training;Splinting/orthotics;Visual/perceptual remediation/compensation;Wheelchair propulsion/positioning;Stair training;Neuromuscular re-education;Community reintegration;Discharge planning;Pain management;Therapeutic Activities;Therapeutic Exercise;Patient/family education;Disease management/prevention PT Transfers Anticipated Outcome(s): supervision PT Locomotion Anticipated Outcome(s): CGA PT Recommendation Recommendations for Other Services: Neuropsych consult Follow Up Recommendations: Home health PT;24 hour supervision/assistance Patient  destination: Home Equipment Recommended: To be determined Equipment Details: Pt owns rollator only   PT Evaluation Precautions/Restrictions Precautions Precautions: Fall Restrictions Weight Bearing Restrictions: No General Chart Reviewed: Yes Additional Pertinent History: hypertension, diabetes mellitus. Family/Caregiver Present: Yes (Girlfriend) Vital SignsTherapy Vitals Temp: 98.6 F (37 C) Temp Source: Oral Pulse Rate: 73 Resp: 19 BP: (!) 127/59 Patient Position (if appropriate): Lying Oxygen Therapy O2 Device: Room Air Pain   Home Living/Prior Functioning Home Living Living Arrangements: Alone Available Help at Discharge: Available PRN/intermittently Type of Home: Apartment Home Access: Level entry;Other (comment) (Treshold entrance) Home Layout: One level Bathroom Shower/Tub: Chiropodist: Handicapped height Bathroom Accessibility: Yes Additional Comments: 32"doorways  Lives With: Alone Prior Function Level of Independence: Independent with basic ADLs;Other (comment);Requires assistive device for independence (Pt ambulates household and short community distances mod I with rollator.)  Able to Take Stairs?: No Driving: Yes Comments: Pt reports he uses a sock aid for socks. He has long standing numbness/tingling  in fingertips bil hands.   Heambulates with RW.  He drives.  He grocery shops sometimes, and daughter and girlfriend assist PRN.  Pt also reports he was driving PTA  Vision/Perception  Perception Perception: Within Functional Limits Praxis Praxis: Intact  Cognition Overall Cognitive Status: Within Functional Limits for tasks assessed Arousal/Alertness: Awake/alert Orientation Level: Oriented X4 Memory: Appears intact Immediate Memory Recall: Sock;Blue;Bed Memory Recall Sock: Without Cue Memory Recall Blue: Without Cue Memory Recall Bed: Without Cue Awareness: Appears intact Problem Solving: Appears intact Safety/Judgment: Appears intact [employer] Scales of Cognitive Functioning: Purposeful/appropriate Sensation Sensation Light Touch: Impaired Detail Light Touch Impaired Details: Impaired RUE;Impaired LUE;Impaired RLE;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) Hot/Cold: Impaired by gross assessment Proprioception: Impaired Detail (absent great toe R and L) Proprioception Impaired Details: Impaired RLE;Impaired LLE Stereognosis: Impaired by gross assessment Coordination Gross Motor Movements are Fluid and Coordinated: No Fine Motor Movements are Fluid and Coordinated: No Coordination and Movement Description: 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 Heel Shin Test: 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 Motor  Motor Motor: Within Functional Limits;Other (comment) Motor - Skilled Clinical Observations: General deconditioning, nonfocal neurological deficits   Trunk/Postural Assessment  Cervical Assessment Cervical Assessment: Exceptions to [facility] (forward head) Thoracic Assessment Thoracic Assessment: Exceptions to [facility] (kyphosis) Lumbar Assessment Lumbar Assessment: Exceptions to [facility] (sacral sitting with posterior pelvic tilt) Postural Control Postural Control: Within Functional Limits  Balance Balance Balance Assessed: Yes Static Sitting Balance Static Sitting  - Balance Support: Feet supported;No upper extremity supported Static Sitting - Level of Assistance: 5: Stand by assistance Dynamic Sitting Balance Dynamic Sitting - Balance Support: Feet supported;During functional activity Dynamic Sitting - Level of Assistance: 4: Min assist Static Standing Balance Static Standing - Balance Support: No upper extremity supported Static Standing - Level of Assistance: 4: Min assist Dynamic Standing Balance Dynamic Standing - Balance Support: During functional activity Dynamic Standing - Level of Assistance: 3: Mod assist Extremity Assessment  RUE Assessment RUE Assessment: Exceptions to [facility] Active Range of Motion (AROM) Comments: WFL General Strength Comments: Limited grip strength 2/2 arthritic hands RUE Strength Right Shoulder Flexion: 4+/5 LUE Assessment LUE Assessment: Exceptions to [facility] Active Range of Motion (AROM) Comments: limited should ROM 70 degrees 2/2  2/2 baseline arthritis General Strength Comments: Limited grip strength 2/2 arthritic hands LUE Strength Left Shoulder Flexion: 2+/5 RLE Assessment RLE Assessment: Exceptions to [facility] General Strength Comments: ankle DF 4-/5, knee ext 4-/5, knee flex 4-/5, hip flex 3+/5 LLE Assessment LLE Assessment: Exceptions to [facility]  General Strength Comments: Ankle DF 4-/5, knee ext 4-/5, knee flex 3+/5, hip flex 3+/5  Care Tool Care Tool Bed Mobility Roll left and right activity   Roll left and right assist level: Minimal Assistance - Patient > 75%    Sit to lying activity   Sit to lying assist level: Moderate Assistance - Patient 50 - 74%    Lying to sitting edge of bed activity   Lying to sitting edge of bed assist level: Moderate Assistance - Patient 50 - 74%     Care Tool Transfers Sit to stand transfer   Sit to stand assist level: Moderate Assistance - Patient 50 - 74%    Chair/bed transfer   Chair/bed transfer assist level: Moderate Assistance - Patient 50 - 74%     Toilet transfer    Assist Level: Moderate Assistance - Patient 50 - 74%    Car transfer Car transfer activity did not occur: Environmental limitations (car simulator in repair)        Care Tool Locomotion Ambulation   Assist level: 2 helpers (modA with chair follow for safety) Assistive device: Walker-rolling Max distance: 71f  Walk 10 feet activity   Assist level: 2 helpers (mod A) Assistive device: Walker-rolling   Walk 50 feet with 2 turns activity   Assist level: 2 helpers (Risk manager Assistive device: Walker-rolling  Walk 150 feet activity Walk 150 feet activity did not occur: Safety/medical concerns (fatigue)      Walk 10 feet on uneven surfaces activity Walk 10 feet on uneven surfaces activity did not occur: Safety/medical concerns      Stairs   Assist level: 2 helpers (modA) Stairs assistive device: 2 hand rails Max number of stairs: 4  Walk up/down 1 step activity   Walk up/down 1 step (curb) assist level: 2 helpers (modA) Walk up/down 1 step or curb assistive device: 2 hand rails    Walk up/down 4 steps activity Walk up/down 4 steps assist level: 2 helpers (modA) Walk up/down 4 steps assistive device: 2 hand rails  Walk up/down 12 steps activity Walk up/down 12 steps activity did not occur: Safety/medical concerns      Pick up small objects from floor Pick up small object from the floor (from standing position) activity did not occur: Safety/medical concerns      Wheelchair Will patient use wheelchair at discharge?: No          Wheel 50 feet with 2 turns activity      Wheel 150 feet activity        Refer to Care Plan for Long Term Goals  SHORT TERM GOAL WEEK 1 PT Short Term Goal 1 (Week 1): Pt will complete bed mobility with minA without bed features PT Short Term Goal 2 (Week 1): Pt will complete sit<>stand transfers with minA and LRAD PT Short Term Goal 3 (Week 1): Pt will perform bed<>chair transfers with minA and LRAD PT Short Term Goal 4 (Week 1): Pt will ambulate  752fwith minA and LRAD  Recommendations for other services: Neuropsych  Skilled Therapeutic Intervention Mobility Bed Mobility Bed Mobility: Rolling Right;Rolling Left;Supine to Sit;Sit to Supine Rolling Right: Minimal Assistance - Patient > 75% Rolling Left: Minimal Assistance - Patient > 75% Supine to Sit: Moderate Assistance - Patient 50-74% Sit to Supine: Moderate Assistance - Patient 50-74% Transfers Transfers: Stand to Sit;Sit to Stand;Stand Pivot Transfers Sit to Stand: Moderate Assistance - Patient 50-74% Stand to Sit: Moderate Assistance - Patient 50-74% Stand Pivot Transfers: Moderate Assistance -  Patient 50 - 74% Stand Pivot Transfer Details: Verbal cues for precautions/safety;Verbal cues for technique;Verbal cues for gait pattern;Verbal cues for sequencing;Visual cues/gestures for sequencing;Visual cues/gestures for precautions/safety;Visual cues for safe use of DME/AE;Verbal cues for safe use of DME/AE Transfer (Assistive device): Rolling walker Locomotion  Gait Ambulation: Yes Gait Assistance: Moderate Assistance - Patient 50-74% Gait Distance (Feet): 55 Feet Assistive device: Rolling walker Gait Assistance Details: Verbal cues for safe use of DME/AE;Manual facilitation for weight shifting;Verbal cues for sequencing;Verbal cues for gait pattern;Verbal cues for precautions/safety;Verbal cues for technique;Tactile cues for weight shifting Gait Gait: Yes Gait Pattern: Impaired Gait Pattern: Decreased step length - right;Decreased step length - left;Decreased stance time - right;Trunk flexed;Poor foot clearance - right;Poor foot clearance - left;Decreased weight shift to right;Decreased weight shift to left;Decreased stride length Gait velocity: decreased Stairs / Additional Locomotion Stairs: Yes Stairs Assistance: Moderate Assistance - Patient 50 - 74% Stair Management Technique: Two rails Number of Stairs: 4 Height of Stairs: 6 Wheelchair Mobility Wheelchair  Mobility: No    Skilled Intervention: Pt greeted sitting in w/c, awake and agreeable to PT session. Initiated functional mobility and performed evalutation as outlined above. Transported patient in w/c for time management purposes from his room to dayroom gym. He ambulated ~31f with modA and RW with chair follow for safety. Gait distance limited primarily by fatigue. Gait deficits include step-to pattern with moderate R lateral lean, decreased B step length (R>L), and poor foot clearance. He also completed stairs with modA with 2nd person for added safety, negotiated up/down x4 steps with B HR support via step to pattern, increased difficulty with descent > ascent. Pt reports chronic knee OA limiting ability to complete stairs or completed deep flexion activities. He performed stand<>pivot with min/modA from w/c to mat table. Sit>supine with modA for trunk control and BLE management. He completed supine there-ex including: -2x10 hip abduction -2x10 heel slides -2x10 glut sets  He required modA for returning from supine to sitting position. Performed stand<>pivot transfer with min/modA back to his w/c and returned to his room as pt requesting to toilet. He ambulated a short distance in his room with minA and RW to bathroom 3-1 commode, required modA for controlled lowering to toilet and maxA for lowering his pants in standing. Pt continent of bladder while seated on toilet. Required modA for rising from toilet and maxA for raising pants over hips. He ambulated ~130fback to his bed with minA and RW, sit>supine with modA. Remained semi-reclined in bed with needs in reach, bed alarm on, bed rails up.   Instructed pt in results of PT evaluation as detailed above, PT POC, rehab potential, rehab goals, and discharge recommendations. Additionally discussed CIR's policies regarding fall safety and use of chair alarm and/or quick release belt. Pt verbalized understanding and in agreement. Will update pt's family  members as they become available.   Discharge Criteria: Patient will be discharged from PT if patient refuses treatment 3 consecutive times without medical reason, if treatment goals not met, if there is a change in medical status, if patient makes no progress towards goals or if patient is discharged from hospital.  The above assessment, treatment plan, treatment alternatives and goals were discussed and mutually agreed upon: by patient  ChAlger SimonsT, DPT 06/26/2020, 4:02 PM

## 2020-06-26 NOTE — Patient Care Conference (Signed)
Inpatient RehabilitationTeam Conference and Plan of Care Update Date: 06/26/2020   Time: 10:40 AM    Patient Name: Brett Williamson      Medical Record Number: [id number]  Date of Birth: 1940-09-24 Sex: Male         Room/Bed: 4M10C/4M10C-01 Payor Info: Payor: MEDICARE / Plan: MEDICARE PART A / Product Type: *No Product type* /    Admit Date/Time:  06/25/2020  2:20 PM  Primary Diagnosis:  <principal problem not specified>  Hospital Problems: Active Problems:   Traumatic subdural hematoma [facility])    Expected Discharge Date: Expected Discharge Date:  (Set next week)  Team Members Present: Physician leading conference: Dr. Leeroy Cha Care Coodinator Present: Loralee Pacas, LCSWA;Vinette Crites Creig Hines, RN, BSN, [place] Nurse Present: Suella Grove, RN PT Present: Apolinar Junes, PT OT Present: Cherylynn Ridges, OT SLP Present: Weston Anna, SLP PPS Coordinator present : Ileana Ladd, Burna Mortimer, SLP     Current Status/Progress Goal Weekly Team Focus  Bowel/Bladder   Incontinent B/B, patient states LBM 06/25/20  improve continence  provide incontinence care q2h   Swallow/Nutrition/ Hydration             ADL's   Mod  A overall  Supervision  self-care retraining, activty tolerance, sit<>stands, balance, transfers   Mobility   Eval pending         Communication   Eval Pending         Safety/Cognition/ Behavioral Observations  Eval Pending         Pain   c/o headache intermittantly, relieved with tylenol  meet patient's pain goal 2/10  continue to monitor and treat headaches as needed   Skin   right head surgical incision, staples OTA, right heel stage 1, sacral stage 1, and bilat upper and lower extremity skin tears with foam on all. MASD to perineal area and both buttocks receiving gerhardts butt cream  wound healing  assess and treat wounds q shift as ordered     Discharge Planning:  Pt to be assessed. Per EMR pt lives alone; and would need to be Mod I at d/c as he  will have intermittent support from his dtr, granddaughter, and girlfriend.   Team Discussion: Incontinent B/B, timed toileting hourly suggested, denies pain, multiple skin tears and pressure injuries. Neuropathy to hands and feet. OT eval is pending. PT eval is pending. SLP reports patient cognitively is doing well. Patient on target to meet rehab goals: Evals still pending, goals have not been set.  *See Care Plan and progress notes for long and short-term goals.   Revisions to Treatment Plan:  Not at this time.  Teaching Needs: Begin family education when appropriate.  Current Barriers to Discharge: Inaccessible home environment, Decreased caregiver support, Home enviroment access/layout, Incontinence, Wound care, Lack of/limited family support, Weight bearing restrictions, Medication compliance and Behavior  Possible Resolutions to Barriers: Educate patient and family on wound care, dressing changes, diabetes management, weight bearing precautions, safety awareness. Timed toilet patient every 1-2 hours, continue current medication regimen. Encourage patient to participate in therapy and his own care.     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, BP elevated, sacral pressure injury Possible Resolutions to [employer] Focus: Bowel and bladder program, monitor BP TID, monitor crani  incision site and pressure injury daily   Continued Need for Acute Rehabilitation Level of Care: The patient requires daily medical management by a physician with specialized training in physical medicine and rehabilitation for the following  reasons: Direction of a multidisciplinary physical rehabilitation program to maximize functional independence : Yes Medical management of patient stability for increased activity during participation in an intensive rehabilitation regime.: Yes Analysis of laboratory values and/or radiology reports with any subsequent need for medication adjustment and/or medical intervention. : Yes   I attest that I was present, lead the team conference, and concur with the assessment and plan of the team.   Cristi Loron 06/26/2020, 3:08 PM

## 2020-06-26 NOTE — Evaluation (Signed)
Speech Language Pathology Assessment and Plan  Patient Details  Name: Brett Williamson MRN: [id number] Date of Birth: January 10, 1941  SLP Diagnosis: N/A Rehab Potential: N/A ELOS: N/A   Today's Date: 06/26/2020 SLP Individual Time: 4315-4008 SLP Individual Time Calculation (min): 55 min   Hospital Problem: Active Problems:   Traumatic subdural hematoma [facility])  Past Medical History:  Past Medical History:  Diagnosis Date   Diabetes ([place])    Frequent falls    HTN (hypertension)    Past Surgical History:  Past Surgical History:  Procedure Laterality Date   CRANIOTOMY Right 06/19/2020   Procedure: CRANIOTOMY HEMATOMA EVACUATION SUBDURAL;  Surgeon: Erline Levine, MD;  Location: [place];  Service: Neurosurgery;  Laterality: Right;    Assessment / Plan / Recommendation Clinical Impression Patient is an 79 year old right-handed male with history of hypertension, diabetes mellitus.  Per chart review patient lives alone and was independent prior to admission using a Rollator.   Presented 06/19/2020 with reports of frequent falls as well as generalized weakness.  No reported loss of consciousness.  CT the head showed moderately large right-sided subdural hematoma with 8 mm leftward midline shift.  Patient underwent craniotomy hematoma evacuation 06/19/2020 per Dr. Vertell Limber. Follow-up MRI 06/21/2020 showed interval decompression of subdural hematoma with significantly improved mass-effect.  Midline shift decreased to 3 mm.   Maintained on Keppra for seizure prophylaxis.   Therapy evaluations completed with recommendations for  comprehensive rehab program. Patient admitted 06/25/20.  Patient demonstrates behaviors consistent with a Rancho Level VIII and appears to be at his baseline level of cognitive functioning. SLP administered the [facility] Mental Status Examination (SLUMS) and patient scored 28 /30 points with a score of 27 or above considered normal. During informal assessment, patient  demonstrated appropriate awareness and recall of new, daily information with auditory comprehension and verbal expression appearing WFL. Due to patient being at his baseline of cognitive-linguistic functioning, skilled SLP intervention is not warranted at this time. Patient verbalized understanding and agreement.    Skilled Therapeutic Interventions          Administered a cognitive-linguistic evaluation, please see above for details.   SLP Assessment  Patient does not need any further Speech Lanaguage Pathology Services    Recommendations  Oral Care Recommendations: Oral care BID Patient destination: Home Follow up Recommendations: None    SLP Frequency N/A  SLP Duration  SLP Intensity  SLP Treatment/Interventions N/A  N/A  N/A   Pain Pain Assessment Pain Scale: 0-10 Pain Score: 0-No pain  SLP Evaluation Cognition Overall Cognitive Status: Within Functional Limits for tasks assessed Arousal/Alertness: Awake/alert Orientation Level: Oriented X4 Memory: Appears intact Awareness: Appears intact Problem Solving: Appears intact Safety/Judgment: Appears intact [employer] Scales of Cognitive Functioning: Purposeful/appropriate  Comprehension Auditory Comprehension Overall Auditory Comprehension: Appears within functional limits for tasks assessed Expression Expression Primary Mode of Expression: Verbal Verbal Expression Overall Verbal Expression: Appears within functional limits for tasks assessed Oral Motor Oral Motor/Sensory Function Overall Oral Motor/Sensory Function: Within functional limits Motor Speech Overall Motor Speech: Appears within functional limits for tasks assessed  Care Tool Care Tool Cognition Expression of Ideas and Wants Expression of Ideas and Wants: Without difficulty (complex and basic) - expresses complex messages without difficulty and with speech that is clear and easy to understand   Understanding Verbal and Non-Verbal Content  Understanding Verbal and Non-Verbal Content: Understands (complex and basic) - clear comprehension without cues or repetitions   Memory/Recall Ability *first 3 days only Memory/Recall Ability *first 3  days only: Current season;Staff names and faces;That he or she is in a hospital/hospital unit;Location of own room      Short Term Goals: N/A   Refer to Care Plan for Long Term Goals  Recommendations for other services: None   Discharge Criteria: Patient will be discharged from SLP if patient refuses treatment 3 consecutive times without medical reason, if treatment goals not met, if there is a change in medical status, if patient makes no progress towards goals or if patient is discharged from hospital.  The above assessment, treatment plan, treatment alternatives and goals were discussed and mutually agreed upon: by patient  Suzy Kugel 06/26/2020, 9:18 AM

## 2020-06-26 NOTE — Evaluation (Signed)
Occupational Therapy Assessment and Plan  Patient Details  Name: Brett Williamson MRN: [id number] Date of Birth: 21-Oct-1940  OT Diagnosis: muscle weakness (generalized) Rehab Potential: Rehab Potential (ACUTE ONLY): Good ELOS: 2 weeks   Today's Date: 06/26/2020 OT Individual Time: 1100-1200 OT Individual Time Calculation (min): 60 min     Hospital Problem: Active Problems:   Traumatic subdural hematoma [facility])   Past Medical History:  Past Medical History:  Diagnosis Date  . Diabetes ([place])   . Frequent falls   . HTN (hypertension)    Past Surgical History:  Past Surgical History:  Procedure Laterality Date  . CRANIOTOMY Right 06/19/2020   Procedure: CRANIOTOMY HEMATOMA EVACUATION SUBDURAL;  Surgeon: Erline Levine, MD;  Location: [place];  Service: Neurosurgery;  Laterality: Right;    Assessment & Plan Clinical Impression: Patient is a 79 y.o. year old male with recent admission to the hospital on 11/9 after having 4 falls on 11/7 and then subsequent L sided weakness to the point where he couldn't get out of his chair. On 11/9 a craniotomy was performed for a R Subdural hematoma. PMH includes: HTN and DM II.  Patient transferred to CIR on 06/25/2020 .    Patient currently requires mod/max A with basic self-care skills secondary to muscle weakness, decreased cardiorespiratoy endurance and decreased standing balance and decreased balance strategies.  Prior to hospitalization, patient could complete BADL with modified independent .  Patient will benefit from skilled intervention to increase independence with basic self-care skills prior to discharge home with care partner.  Anticipate patient will require 24 hour supervision and follow up home health.  OT - End of Session Endurance Deficit: Yes Endurance Deficit Description: Multiple rest breaks within BADL tasks OT Assessment Rehab Potential (ACUTE ONLY): Good OT Barriers to Discharge: Decreased caregiver support OT Patient  demonstrates impairments in the following area(s): Balance;Endurance;Motor;Safety;Sensory;Skin Integrity OT Basic ADL's Functional Problem(s): Eating;Grooming;Bathing;Dressing;Toileting OT Advanced ADL's Functional Problem(s): Simple Meal Preparation OT Transfers Functional Problem(s): Toilet;Tub/Shower OT Additional Impairment(s): Fuctional Use of Upper Extremity OT Plan OT Intensity: Minimum of 1-2 x/day, 45 to 90 minutes OT Frequency: 5 out of 7 days OT Duration/Estimated Length of Stay: 2 weeks OT Treatment/Interventions: Balance/vestibular training;Community reintegration;Discharge planning;Disease mangement/prevention;DME/adaptive equipment instruction;Functional mobility training;Neuromuscular re-education;Patient/family education;Pain management;Self Care/advanced ADL retraining;Skin care/wound managment;Splinting/orthotics;Therapeutic Activities;Therapeutic Exercise;UE/LE Strength taining/ROM;UE/LE Coordination activities;Wheelchair propulsion/positioning OT Self Feeding Anticipated Outcome(s): Set-up A OT Basic Self-Care Anticipated Outcome(s): Supervision OT Toileting Anticipated Outcome(s): Supervision OT Bathroom Transfers Anticipated Outcome(s): Supervision OT Recommendation Patient destination: Home Follow Up Recommendations: Home health OT Equipment Recommended: To be determined   OT Evaluation Precautions/Restrictions  Precautions Precautions: Fall Restrictions Weight Bearing Restrictions: No Home Living/Prior Functioning Home Living Family/patient expects to be discharged to:: Private residence Living Arrangements: Alone Available Help at Discharge: Available PRN/intermittently (grandaughter lives nearby but only comes over when called; g) Type of Home: Apartment Home Layout: One level Bathroom Shower/Tub: Chiropodist: Handicapped height Bathroom Accessibility: Yes Additional Comments: 32"doorways  Lives With: Alone IADL History Homemaking  Responsibilities: Yes IADL Comments: Pt performing all iADLs Prior Function Level of Independence: Independent with basic ADLs, Independent with homemaking with ambulation Driving: Yes Comments: Pt reports he uses a sock aid for socks. He has long standing numbness/tingling in fingertips bil hands.   Heambulates with RW.  He drives.  He grocery shops sometimes, and daughter and girlfriend assist PRN.  Pt also reports he was driving PTA  Cognition Overall Cognitive Status: Within Functional Limits for tasks assessed Arousal/Alertness: Awake/alert Orientation Level: Person;Place;Situation  Person: Oriented Place: Oriented Situation: Oriented Year: 2021 Month: November Day of Week: Correct Memory: Appears intact Immediate Memory Recall: Sock;Blue;Bed Memory Recall Sock: Without Cue Memory Recall Blue: Without Cue Memory Recall Bed: Without Cue Awareness: Appears intact Problem Solving: Appears intact Safety/Judgment: Appears intact [employer] Scales of Cognitive Functioning: Purposeful/appropriate Sensation Sensation Light Touch: Impaired Detail Light Touch Impaired Details: Impaired RUE;Impaired LUE;Impaired RLE;Impaired 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) Hot/Cold: Impaired by gross assessment Stereognosis: Impaired by gross assessment Coordination Gross Motor Movements are Fluid and Coordinated: No Fine Motor Movements are Fluid and Coordinated: No Coordination and Movement Description: 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 Balance Balance Balance Assessed: Yes Static Standing Balance Static Standing - Balance Support: During functional activity Static Standing - Level of Assistance: 4: Min assist Dynamic Standing Balance Dynamic Standing - Balance Support: During functional activity Dynamic Standing - Level of Assistance: 3: Mod assist Extremity/Trunk Assessment RUE Assessment RUE Assessment: Exceptions to [facility] Active Range of Motion (AROM)  Comments: WFL General Strength Comments: Limited grip strength 2/2 arthritic hands RUE Strength Right Shoulder Flexion: 4+/5 LUE Assessment LUE Assessment: Exceptions to [facility] Active Range of Motion (AROM) Comments: limited should ROM 70 degrees 2/2  2/2 baseline arthritis General Strength Comments: Limited grip strength 2/2 arthritic hands LUE Strength Left Shoulder Flexion: 2+/5  Care Tool Care Tool Self Care Eating   Eating Assist Level: Minimal Assistance - Patient > 75%    Oral Care    Oral Care Assist Level: Minimal Assistance - Patient > 75%    Bathing   Body parts bathed by patient: Right arm;Left arm;Chest;Abdomen;Face;Left upper leg;Right upper leg Body parts bathed by helper: Right lower leg;Left lower leg;Front perineal area;Buttocks   Assist Level: Moderate Assistance - Patient 50 - 74%    Upper Body Dressing(including orthotics)   What is the patient wearing?: Pull over shirt   Assist Level: Moderate Assistance - Patient 50 - 74%    Lower Body Dressing (excluding footwear)   What is the patient wearing?: Incontinence brief;Pants Assist for lower body dressing: Maximal Assistance - Patient 25 - 49%    Putting on/Taking off footwear   What is the patient wearing?: Non-skid slipper socks Assist for footwear: Total Assistance - Patient < 25%       Care Tool Toileting Toileting activity   Assist for toileting: Maximal Assistance - Patient 25 - 49%     Care Tool Bed Mobility Roll left and right activity        Sit to lying activity        Lying to sitting edge of bed activity         Care Tool Transfers Sit to stand transfer   Sit to stand assist level: Moderate Assistance - Patient 50 - 74%    Chair/bed transfer   Chair/bed transfer assist level: Moderate Assistance - Patient 50 - 74%     Toilet transfer   Assist Level: Moderate Assistance - Patient 50 - 74%     Care Tool Cognition Expression of Ideas and Wants Expression of Ideas and Wants:  Without difficulty (complex and basic) - expresses complex messages without difficulty and with speech that is clear and easy to understand   Understanding Verbal and Non-Verbal Content Understanding Verbal and Non-Verbal Content: Understands (complex and basic) - clear comprehension without cues or repetitions   Memory/Recall Ability *first 3 days only Memory/Recall Ability *first 3 days only: Current season;Staff names and faces;That he or she is in  a hospital/hospital unit;Location of own room    Refer to Care Plan for Long Term Goals  SHORT TERM GOAL WEEK 1 OT Short Term Goal 1 (Week 1): Pt will tolerate standing for 3 minutes wiithin BADL task OT Short Term Goal 2 (Week 1): Pt will be given built up handles to increase independence with self-feeding and grooming tasks OT Short Term Goal 3 (Week 1): Pt will complete toilet transfer with min A and LRAD  Recommendations for other services: Therapeutic Recreation  Outing/community reintegration and Other bowling! Pt was in bowling league for 60 years   Skilled Therapeutic Intervention Pt greeted semi-reclined in bed and agreeable to OT eval and treat. OT eval completed addressing rehab process, OT purpose, POC, ELOS, and goals.  Pt came to sitting EOB with increased time and mod A. Sit<>stand from EOB with mod A and use of RW to then pivot over to wc w. Mod A and slight posterior LOB. Bathing/dressing completed from wc at the sink. Mod A for sit<>stand, then min A for balance within BADL tasks. Pt reported need to go to the bathroom and completed stand-pivot to [facility] over toilet with grab bars and mod A. OT assist for all clothing management. Pt voided bladder successfully, but had to have towel placed over peri-area to keep urine from getting on the floor. OT provided pt with larger wc to accommodate body habitus. Pt left seated in wc at end of session with chair alarm on, call bell in reach, and significant other present.    ADL ADL Eating:  Minimal assistance Grooming: Minimal assistance Upper Body Bathing: Minimal assistance Lower Body Bathing: Maximal assistance Upper Body Dressing: Minimal assistance Lower Body Dressing: Maximal assistance Toileting: Maximal assistance Toilet Transfer: Moderate assistance Mobility  Bed Mobility Bed Mobility: Supine to Sit Supine to Sit: Moderate Assistance - Patient 50-74% Transfers Sit to Stand: Moderate Assistance - Patient 50-74% Stand to Sit: Moderate Assistance - Patient 50-74%   Discharge Criteria: Patient will be discharged from OT if patient refuses treatment 3 consecutive times without medical reason, if treatment goals not met, if there is a change in medical status, if patient makes no progress towards goals or if patient is discharged from hospital.  The above assessment, treatment plan, treatment alternatives and goals were discussed and mutually agreed upon: by patient  Valma Cava 06/26/2020, 1:04 PM

## 2020-06-26 NOTE — Progress Notes (Signed)
Patient Details  Name: Brett Williamson MRN: [id number] Date of Birth: 1940-12-31  Today's Date: 06/26/2020  Hospital Problems: Active Problems:   Traumatic subdural hematoma [facility])  Past Medical History:  Past Medical History:  Diagnosis Date  . Diabetes ([place])   . Frequent falls   . HTN (hypertension)    Past Surgical History:  Past Surgical History:  Procedure Laterality Date  . CRANIOTOMY Right 06/19/2020   Procedure: CRANIOTOMY HEMATOMA EVACUATION SUBDURAL;  Surgeon: Erline Levine, MD;  Location: [place];  Service: Neurosurgery;  Laterality: Right;   Social History:  reports that he has never smoked. He has never used smokeless tobacco. He reports that he does not drink alcohol and does not use drugs.  Family / Support Systems Marital Status: Widow/Widower Patient Roles: Parent, Partner Children: Catherine-daughter [phone] Other Supports: Serena-granddaughter [phone] Anticipated Caregiver: [name] and Roanna Epley Ability/Limitations of Caregiver: Both work and will need to come up with a plan, pt plans to talk with daughter regarding plan Caregiver Availability: Other (Comment) (Coming up with a plan for DC) Family Dynamics: Close with daughter and grandchildren and great grandchildren. He has a lady friend who they talk daily on the phone and go out to dinner twice a week  Social History Preferred language: English Religion:  Cultural Background: No issues Education: HS Read: Yes Write: Yes Employment Status: Retired Public relations account executive Issues: No issues Guardian/Conservator: None-according to MD pt is capable of making his own decisions while here. Will include daughter per pt's request and still somewhat groggy at times.   Abuse/Neglect Abuse/Neglect Assessment Can Be Completed: Yes Physical Abuse: Denies Verbal Abuse: Denies Sexual Abuse: Denies Exploitation of patient/patient's resources: Denies Self-Neglect: Denies  Emotional Status Pt's  affect, behavior and adjustment status: Pt is still trying to figure out how he slid out of the chair like he did. He has always been independent but lately he has fallen it started in Sept but the last few falls were slides out of his lift chair. Recent Psychosocial Issues: other health issues-frequent falls in the past two months Psychiatric History: No history deferred depression screen due to coping appropriately and able to express his concerns. May ask neuro-psych to see while here Substance Abuse History: No issues  Patient / Family Perceptions, Expectations & Goals Pt/Family understanding of illness & functional limitations: Pt is able to explain his surgery and brain bleed. He feels better and is trying to get his strength back. He does talk with the MD and feels he has a farily good understanding of his plan going forward. Premorbid pt/family roles/activities: Father, grandfather, great grandfather, boyfriend, retiree, etc Anticipated changes in roles/activities/participation: resume Pt/family expectations/goals: Pt states: " I want to get back to using my rollator and stay alone, I know I can;t right now."  [employer]: None Premorbid Home Care/DME Agencies: Other (Comment) (has handicapped apartment-rollator and three wheeled walker) Transportation available at discharge: Pt was still driving Resource referrals recommended: Neuropsychology  Discharge Planning Living Arrangements: Alone Support Systems: Children, Other relatives, Spouse/significant other Type of Residence: Private residence Insurance Resources: Medicare (part A only) Pensions consultant: Radio broadcast assistant Screen Referred: No Living Expenses: Education officer, community Management: Patient Does the patient have any problems obtaining your medications?: No Home Management: Daughter and granddaughter Patient/Family Preliminary Plans: Will discuss with daughter but plan to go to his handicapped  apartment where it is accessible. She lives 20 mintues away and gradndaughter lives 10 mintues away. He talks to girlfriend twice a day and goes  to dinner with two times a week. he is aware the therapy team recommends 24 hr supervision at discharge. Care Coordinator Barriers to Discharge: Decreased caregiver support Care Coordinator Barriers to Discharge Comments: Currently does not have 24 hr care Care Coordinator Anticipated Follow Up Needs: HH/OP  Clinical Impression Pleasant gentleman who is motivated to improve and regain his independence prior to discharge. He is aware of the recommendation of 24/7 supervision. He will discuss with daughter a safe plan for him at discharge.  Elease Hashimoto 06/26/2020, 3:18 PM

## 2020-06-26 NOTE — Plan of Care (Signed)
°  Problem: RH Balance Goal: LTG: Patient will maintain dynamic sitting balance (OT) Description: LTG:  Patient will maintain dynamic sitting balance with assistance during activities of daily living (OT) Flowsheets (Taken 06/26/2020 1242) LTG: Pt will maintain dynamic sitting balance during ADLs with: Independent Goal: LTG Patient will maintain dynamic standing with ADLs (OT) Description: LTG:  Patient will maintain dynamic standing balance with assist during activities of daily living (OT)  Flowsheets (Taken 06/26/2020 1242) LTG: Pt will maintain dynamic standing balance during ADLs with: Supervision/Verbal cueing   Problem: Sit to Stand Goal: LTG:  Patient will perform sit to stand in prep for activites of daily living with assistance level (OT) Description: LTG:  Patient will perform sit to stand in prep for activites of daily living with assistance level (OT) Flowsheets (Taken 06/26/2020 1242) LTG: PT will perform sit to stand in prep for activites of daily living with assistance level: Supervision/Verbal cueing   Problem: RH Eating Goal: LTG Patient will perform eating w/assist, cues/equip (OT) Description: LTG: Patient will perform eating with assist, with/without cues using equipment (OT) Flowsheets (Taken 06/26/2020 1242) LTG: Pt will perform eating with assistance level of: Set up assist    Problem: RH Grooming Goal: LTG Patient will perform grooming w/assist,cues/equip (OT) Description: LTG: Patient will perform grooming with assist, with/without cues using equipment (OT) Flowsheets (Taken 06/26/2020 1242) LTG: Pt will perform grooming with assistance level of: Independent with assistive device    Problem: RH Bathing Goal: LTG Patient will bathe all body parts with assist levels (OT) Description: LTG: Patient will bathe all body parts with assist levels (OT) Flowsheets (Taken 06/26/2020 1242) LTG: Pt will perform bathing with assistance level/cueing: Supervision/Verbal  cueing   Problem: RH Dressing Goal: LTG Patient will perform upper body dressing (OT) Description: LTG Patient will perform upper body dressing with assist, with/without cues (OT). Flowsheets (Taken 06/26/2020 1242) LTG: Pt will perform upper body dressing with assistance level of: Supervision/Verbal cueing Goal: LTG Patient will perform lower body dressing w/assist (OT) Description: LTG: Patient will perform lower body dressing with assist, with/without cues in positioning using equipment (OT) Flowsheets (Taken 06/26/2020 1242) LTG: Pt will perform lower body dressing with assistance level of: Supervision/Verbal cueing   Problem: RH Toileting Goal: LTG Patient will perform toileting task (3/3 steps) with assistance level (OT) Description: LTG: Patient will perform toileting task (3/3 steps) with assistance level (OT)  Flowsheets (Taken 06/26/2020 1242) LTG: Pt will perform toileting task (3/3 steps) with assistance level: Supervision/Verbal cueing   Problem: RH Simple Meal Prep Goal: LTG Patient will perform simple meal prep w/assist (OT) Description: LTG: Patient will perform simple meal prep with assistance, with/without cues (OT). Flowsheets (Taken 06/26/2020 1242) LTG: Pt will perform simple meal prep with assistance level of: Supervision/Verbal cueing   Problem: RH Toilet Transfers Goal: LTG Patient will perform toilet transfers w/assist (OT) Description: LTG: Patient will perform toilet transfers with assist, with/without cues using equipment (OT) Flowsheets (Taken 06/26/2020 1242) LTG: Pt will perform toilet transfers with assistance level of: Supervision/Verbal cueing   Problem: RH Tub/Shower Transfers Goal: LTG Patient will perform tub/shower transfers w/assist (OT) Description: LTG: Patient will perform tub/shower transfers with assist, with/without cues using equipment (OT) Flowsheets (Taken 06/26/2020 1242) LTG: Pt will perform tub/shower stall transfers with assistance  level of: Supervision/Verbal cueing

## 2020-06-27 ENCOUNTER — Inpatient Hospital Stay (HOSPITAL_COMMUNITY): Payer: Self-pay

## 2020-06-27 ENCOUNTER — Inpatient Hospital Stay (HOSPITAL_COMMUNITY): Payer: Self-pay | Admitting: Occupational Therapy

## 2020-06-27 LAB — GLUCOSE, CAPILLARY
Glucose-Capillary: 146 mg/dL — ABNORMAL HIGH (ref 70–99)
Glucose-Capillary: 159 mg/dL — ABNORMAL HIGH (ref 70–99)
Glucose-Capillary: 159 mg/dL — ABNORMAL HIGH (ref 70–99)
Glucose-Capillary: 169 mg/dL — ABNORMAL HIGH (ref 70–99)

## 2020-06-27 NOTE — Progress Notes (Signed)
Occupational Therapy Session Note  Patient Details  Name: Brett Williamson MRN: [id number] Date of Birth: [birth year]  Today's Date: 06/27/2020 OT Individual Time: 7829-5621 OT Individual Time Calculation (min): 65 min    Short Term Goals: Week 1:  OT Short Term Goal 1 (Week 1): Pt will tolerate standing for 3 minutes wiithin BADL task OT Short Term Goal 2 (Week 1): Pt will be given built up handles to increase independence with self-feeding and grooming tasks OT Short Term Goal 3 (Week 1): Pt will complete toilet transfer with min A and LRAD  Skilled Therapeutic Interventions/Progress Updates:    Pt received in bed ready to get up for therapy.  He was able to sit to EOB with S, stood to RW with CGA and completed transfer to wc with RW CGA.  Pt requested to toilet, pt transferred to toilet with bars with close S. He had a BM but stated he was not able to self cleanse due to limited hand dexterity. He states he uses a toilet aid at home. Suggested he ask his family to bring his in.  Pt then ambulated to wc with Rw with CGA.  He completed bathing and dressing at sink with sit to stands with min A overall completing his sit to stands with UE support with supervision.  He was able to tolerate standing for quite a bit of time for cleansing.   Pt opted to sit in recliner. Ambulated to recliner with CGA with RW. Set up with belt alarm and all needs met.   Therapy Documentation Precautions:  Precautions Precautions: Fall Restrictions Weight Bearing Restrictions: No  Pain: Pain Assessment Pain Score: 0-No pain ADL: ADL Eating: Minimal assistance Grooming: Setup Upper Body Bathing: Supervision/safety Where Assessed-Upper Body Bathing: Sitting at sink Lower Body Bathing: Moderate assistance Where Assessed-Lower Body Bathing: Standing at sink, Sitting at sink Upper Body Dressing: Minimal assistance Where Assessed-Upper Body Dressing: Sitting at sink Lower Body Dressing: Moderate  assistance Where Assessed-Lower Body Dressing: Sitting at sink Toileting: Moderate assistance Where Assessed-Toileting: Armed forces operational officer: Raised toilet seat, Grab bars   Therapy/Group: Individual Therapy  [place] 06/27/2020, 11:20 AM

## 2020-06-27 NOTE — Plan of Care (Signed)
Behavioral Plan   [place] Level: VIII  Behavior to decrease/ eliminate: No behaviors noted at this time   Changes to environment: Will monitor sleep habits due to prior routine   Interventions:Bed and chair/belt alarm    Recommendations for interactions with patient: Make sure needs are met (pt has urinary urgency with decreased hand dexterity) at bedside.    Attendees: Meriel Pica OT, Weston Anna SLP, Ginnie Smart PT

## 2020-06-27 NOTE — Progress Notes (Signed)
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 Recent Labs    06/26/20 0549  WBC 9.6  HGB 12.9*  HCT 39.3  PLT 231   Recent Labs    06/26/20 0549  NA 134*  K 3.8  CL 103  CO2 21*  GLUCOSE 161*  BUN 28*  CREATININE 1.45*  CALCIUM 8.7*    Intake/Output Summary (Last 24 hours) at 06/27/2020 1054 Last data filed at 06/27/2020 0700 Gross per 24 hour  Intake 480 ml  Output --  Net 480 ml     Pressure Injury 06/25/20 Sacrum Mid Stage 1 -  Intact skin with non-blanchable redness of a localized area usually over a bony prominence. area to mid-sacrum (Active)  06/25/20 1530  Location: Sacrum  Location Orientation: Mid  Staging: Stage 1 -  Intact skin with non-blanchable redness of a localized area usually over a bony prominence.  Wound Description (Comments): area to mid-sacrum  Present on Admission: Yes     Pressure Injury 06/25/20 Heel Right;Left;Posterior Stage 1 -  Intact skin with non-blanchable redness of a localized area usually over a bony prominence. Right and left heel extremely slow to blanch. Will place foam dressing to both. (Active)  06/25/20 1515  Location: Heel  Location Orientation: Right;Left;Posterior  Staging: Stage 1 -  Intact skin with non-blanchable redness of a localized area usually over a bony prominence.  Wound Description (Comments): Right and left heel extremely slow to blanch. Will place foam dressing to both.  Present on Admission: Yes    Physical Exam: Vital Signs Blood pressure (!) 144/69, pulse 66, temperature 97.8 F (36.6 C), temperature source Oral, resp. rate 20, height 5\' 11"  (1.803 m), weight 117.4 kg, SpO2 95 %. General: Alert and oriented x 3, No apparent distress HEENT: Head is normocephalic, atraumatic,  PERRLA, EOMI, sclera anicteric, oral mucosa pink and moist, dentition intact, ext ear canals clear,  Neck: Supple without JVD or lymphadenopathy Heart: Reg rate and rhythm. No murmurs rubs or gallops Chest: CTA bilaterally without wheezes, rales, or rhonchi; no distress Abdomen: Soft, non-tender, non-distended, bowel sounds positive. Extremities: No clubbing, cyanosis, or edema. Pulses are 2+  Skin: Craniotomy site clean and dry, stage 1 pressure injury on sacrum.  Musculoskeletal:  Cervical back: Normal range of motion. Norigidity.  Comments: 4-/5 in UEs- and equal- biceps, triceps, WE and grip 4-/5 in LEs- HF, KE, and PF- DF 2/5 B/L- and has decreased ROM of DF B/L as well Also has large scar on R 2nd digit- swollen hands, stiff, "chronic". 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 Oriented to person place and time. Follows simple commands.  Decreased sensation to light touch in stocking/glove distribution- normal for him   Assessment/Plan: 1. Functional deficits which require 3+ hours per day of interdisciplinary therapy in a comprehensive inpatient rehab setting.  Physiatrist is providing close team supervision and 24 hour management of active medical problems listed below.  Physiatrist and rehab team continue to assess barriers to discharge/monitor patient progress toward functional and medical goals  Care Tool:  Bathing    Body parts bathed by patient: Right arm, Left arm,  Chest, Abdomen, Face, Left upper leg, Right upper leg   Body parts bathed by helper: Buttocks, Front perineal area     Bathing assist Assist Level: Moderate Assistance - Patient 50 - 74%     Upper Body Dressing/Undressing Upper body dressing   What is the patient wearing?: Pull over shirt    Upper body  assist Assist Level: Moderate Assistance - Patient 50 - 74%    Lower Body Dressing/Undressing Lower body dressing      What is the patient wearing?: Incontinence brief, Pants     Lower body assist Assist for lower body dressing: Maximal Assistance - Patient 25 - 49%     Toileting Toileting    Toileting assist Assist for toileting: Maximal Assistance - Patient 25 - 49%     Transfers Chair/bed transfer  Transfers assist     Chair/bed transfer assist level: Moderate Assistance - Patient 50 - 74%     Locomotion Ambulation   Ambulation assist      Assist level: 2 helpers (modA with chair follow for safety) Assistive device: Walker-rolling Max distance: 25ft   Walk 10 feet activity   Assist     Assist level: 2 helpers (mod A) Assistive device: Walker-rolling   Walk 50 feet activity   Assist    Assist level: 2 helpers (modA) Assistive device: Walker-rolling    Walk 150 feet activity   Assist Walk 150 feet activity did not occur: Safety/medical concerns (fatigue)         Walk 10 feet on uneven surface  activity   Assist Walk 10 feet on uneven surfaces activity did not occur: Safety/medical concerns         Wheelchair     Assist Will patient use wheelchair at discharge?: No             Wheelchair 50 feet with 2 turns activity    Assist            Wheelchair 150 feet activity     Assist          Blood pressure (!) 144/69, pulse 66, temperature 97.8 F (36.6 C), temperature source Oral, resp. rate 20, height 5\' 11"  (1.803 m), weight 117.4 kg, SpO2 95 %.  Medical Problem List and Plan: 1.  Decreased functional mobility with generalized weakness as well as reported multiple falls secondary to traumatic right side subdural hematoma.  Status post craniotomy hematoma evacuation 06/19/2020             -patient may not shower             -ELOS/Goals: S 2-3 weeks  -Performed well on cognitive testing 2.   Antithrombotics: -DVT/anticoagulation: SCDs.             -antiplatelet therapy: N/A 3. Pain Management: Hydrocodone as needed. Pain is currently well controlled.   4. Mood: Provide emotional support             -antipsychotic agents: N/A 5. Neuropsych: This patient is capable of making decisions on his own behalf. 6. Skin/Wound Care: Gerhardt Butt cream applied to buttocks scrotum perineal area after cleansing and drying 7. Fluids/Electrolytes/Nutrition: Routine in and outs with follow-up chemistries 8.  Seizure prophylaxis.  Continue Keppra 500 mg twice daily 9.  Gram-negative rod UTI.  Rocephin started 06/20/2020 and stopped 11/17 after completed course.  10.  History of hypertension.  Patient on Lotensin 20 mg daily and HCTZ 25 mg daily prior to admission.  Resume as needed. Currently slightly  elevated to 144/69. Has been labile- continue to monitor.   11.  Diet-controlled diabetes mellitus.  Hemoglobin A1c 6.6.  SSI.  11/16: CBG elevated to 206 last night. RD consult ordered for education. Better control on 11/17.  12. Constipation: having daily BM.    LOS: 2 days A FACE TO FACE EVALUATION WAS PERFORMED  Clide Deutscher Fusaye Wachtel 06/27/2020, 10:54 AM

## 2020-06-27 NOTE — Progress Notes (Signed)
Physical Therapy Session Note  Patient Details  Name: Brett Williamson MRN: [id number] Date of Birth: [birth year]  Today's Date: 06/27/2020 PT Individual Time: 1420-1530 PT Individual Time Calculation (min): 70 min   Short Term Goals: Week 1:  PT Short Term Goal 1 (Week 1): Pt will complete bed mobility with minA without bed features PT Short Term Goal 2 (Week 1): Pt will complete sit<>stand transfers with minA and LRAD PT Short Term Goal 3 (Week 1): Pt will perform bed<>chair transfers with minA and LRAD PT Short Term Goal 4 (Week 1): Pt will ambulate 3ft with minA and LRAD  Skilled Therapeutic Interventions/Progress Updates:    Patient up in recliner and reports only chronic knee pain.  Describes falls at home and home situation, reporting uses rollator in the home.  Sit to stand with min A.  Ambulated with RW x 60' with w/c follow.  Seated for w/c mobility x 30' with min A increased time.  Patient ambulated with bari rollator x 50' with min A for balance, able to keep walker close enough, but still with some flexed posture.  Patient performed standing balance activity reaching for and tossing horseshoes with CGA for balance and 1 UE support.  Patient negotiated curb step (6") with rollator and min to mod A for help with managing walker (at home reports using 3 wheeled walker for outings).  Patient assisted to dayroom and patient performed 1 minute standing reaching on BITS balance system using 1 UE support reaching with R hand to touch circles.  Difficulty reaching to L with R hand.  Patient practiced transfers to different chairs in [place] ambulating short distance about 15-20' between chairs and requesting to only use chairs with arms as that is how he manages at home.  Patient CGA for transfers from armchairs.  Patient in w/c assisted to room with rollator.  Pt. Transferred to recliner for comfort with CGA.  Left with alarm belt active and needs in reach.  Therapy Documentation Precautions:   Precautions Precautions: Fall Restrictions Weight Bearing Restrictions: No Pain: Pain Assessment Pain Score: 4  Pain Type: Chronic pain Pain Location: Knee Pain Orientation: Right;Left Pain Descriptors / Indicators: Aching;Sore Pain Onset: On-going Pain Intervention(s): Ambulation/increased activity    Therapy/Group: Individual Therapy  Reginia Naas  [place], PT 06/27/2020, 3:22 PM

## 2020-06-27 NOTE — Progress Notes (Signed)
Occupational Therapy Session Note  Patient Details  Name: Brett Williamson MRN: [id number] Date of Birth: October 10, 1940  Today's Date: 06/27/2020 OT Individual Time: 6578-4696 OT Individual Time Calculation (min): 71 min    Short Term Goals: Week 1:  OT Short Term Goal 1 (Week 1): Pt will tolerate standing for 3 minutes wiithin BADL task OT Short Term Goal 2 (Week 1): Pt will be given built up handles to increase independence with self-feeding and grooming tasks OT Short Term Goal 3 (Week 1): Pt will complete toilet transfer with min A and LRAD  Skilled Therapeutic Interventions/Progress Updates:    1;1. Pt received in recliner agreeable to OT. Pt completes all mobility with MIN-CGA and use fo RW. Pt with crepitus in B knees audibly popping during standing trial later in session but not rated as "painful." Pt reporting likely needing to change brief and OT changes in standing with CGA provided to pt. Pt able to pull pants up and down after OT changes brief. Pt and OT discus home bathroom set up and pt reports having shower chair and OT demo TTB/shower sticker/curtain adaptations to improve safety. Pt trails TTB transfer with RW and MIN A overall able to manage LE over tub ledge. Pt verbalized liking option but wanting to wait to see if shower chair is safe in a few days. Pt reporting sfaety issue with drying off and OT suggests use of terry cloth robe to decrease energy/balance demands. Pt completes standing trial making B test in 73min 49 seconds with CGA and A to reach in far ranges with LUE on BITS system. Pt completes memory actiivty recalling sequence of words in list up to 5 and demo difficulty recalling 6 or more wirds in a row. Exited session with pt seated in recliner exit alarm on and call light in reach   Therapy Documentation Precautions:  Precautions Precautions: Fall Restrictions Weight Bearing Restrictions: No General:   Vital Signs:  Pain:   ADL: ADL Eating: Minimal  assistance Grooming: Minimal assistance Upper Body Bathing: Minimal assistance Lower Body Bathing: Maximal assistance Upper Body Dressing: Minimal assistance Lower Body Dressing: Maximal assistance Toileting: Maximal assistance Toilet Transfer: Moderate assistance Vision   Perception    Praxis   Exercises:   Other Treatments:     Therapy/Group: Individual Therapy  Tonny Branch 06/27/2020, 9:45 AM

## 2020-06-27 NOTE — Plan of Care (Signed)
Nutrition Education Note  RD consulted for nutrition education regarding diabetes.  Spoke with pt at bedside. He reports appetite is good and comparable to what his appetite is at home. Pt states that he does not eat much at meals but that this is baseline for him. Meal completions charted as 75-100% during CIR admission.  Pt reports that at home he does "nothing" to manage his blood sugars at homes. Pt reports that his blood sugars are rarely above 150 when he is at home. Pt confused as to why he is requiring insulin during admission. Discussed with pt that it is normal for blood sugars to be elevated in the acute care setting especially when related to acute illness, surgery, etc. Pt expresses understanding.  Pt repots that many years ago he was started on a "blood sugar pill" by his PCP because some of his blood sugars were elevated but that he has not been taking it recently. Pt reports that he rarely eats sweets or drinks sugar-sweetened beverages.  Lab Results  Component Value Date   HGBA1C 6.6 (H) 06/19/2020    RD provided "Carbohydrate Counting for People with Diabetes" handout from the Academy of Nutrition and Dietetics. Discussed different food groups and their effects on blood sugar, emphasizing carbohydrate-containing foods. Provided list of carbohydrates and recommended serving sizes of common foods.  Discussed importance of controlled and consistent carbohydrate intake throughout the day. Provided examples of ways to balance meals/snacks and encouraged intake of high-fiber, whole grain complex carbohydrates. Teach back method used.  Expect good compliance.  Current diet order is Carb Modified, patient is consuming approximately 75-100% of meals at this time. Labs and medications reviewed. No further nutrition interventions warranted at this time. RD contact information provided. If additional nutrition issues arise, please re-consult RD.   Brett Bryant, MS, RD, LDN Inpatient  Clinical Dietitian Please see [facility] for contact information.

## 2020-06-27 NOTE — Progress Notes (Addendum)
Patient ID: Brett Williamson, male   DOB: [birth year], 79 y.o.   MRN: [id number]  SW met with pt in room to introduce self, explain role, discuss discharge plan, and inform on d/c date being sent next week since he is still being evaluated. SW discussed with pt support he will have when he discharges home. Pt repots support is intermittent. Pt states primary contact to discuss d/c needs is his daughter [name]. Pt states he will have support from his dtr, granddaughter, and girlfriend. SW briefly discussed if he required 24/7 care was this an option, and short term rehab. Pt reports he would like to remain in his home. SW dicussed with pt if he intends to get Medicare part B, reports he cannot afford. SW asked about applying for Medicaid,pt states he was unsure, and his dtr may have started working on this already.  SW left message for pt dtr [name] [phone]) to introduce self, provide updates, and discuss discharge process. SW waiting on follow-up.   Loralee Pacas, MSW, [place] Office: [phone] Cell: [phone] Fax: [phone]

## 2020-06-28 ENCOUNTER — Inpatient Hospital Stay (HOSPITAL_COMMUNITY): Payer: Self-pay

## 2020-06-28 ENCOUNTER — Inpatient Hospital Stay (HOSPITAL_COMMUNITY): Payer: Self-pay | Admitting: Occupational Therapy

## 2020-06-28 LAB — GLUCOSE, CAPILLARY
Glucose-Capillary: 134 mg/dL — ABNORMAL HIGH (ref 70–99)
Glucose-Capillary: 144 mg/dL — ABNORMAL HIGH (ref 70–99)
Glucose-Capillary: 195 mg/dL — ABNORMAL HIGH (ref 70–99)
Glucose-Capillary: 188 mg/dL — ABNORMAL HIGH (ref 70–99)

## 2020-06-28 NOTE — IPOC Note (Signed)
Overall Plan of Care [facility]) Patient Details Name: Brett Williamson MRN: [id number] DOB: 05-25-1941  Admitting Diagnosis: Traumatic subdural hematoma [facility])  Hospital Problems: Principal Problem:   Traumatic subdural hematoma ([place])     Functional Problem List: Nursing Bladder, Bowel, Endurance, Medication Management, Motor, Pain, Safety, Skin Integrity, Sensory  PT Balance, Edema, Endurance, Motor, Safety, Sensory, Skin Integrity  OT Balance, Endurance, Motor, Safety, Sensory, Skin Integrity  SLP    TR         Basic ADL's: OT Eating, Grooming, Bathing, Dressing, Toileting     Advanced  ADL's: OT Simple Meal Preparation     Transfers: PT Bed Mobility, Bed to Chair, Car, Manufacturing systems engineer, Metallurgist: PT Ambulation     Additional Impairments: OT Fuctional Use of Upper Extremity  SLP None      TR      Anticipated Outcomes Item Anticipated Outcome  Self Feeding Set-up A  Swallowing      Basic self-care  Supervision  Toileting  Supervision   Bathroom Transfers Supervision  Bowel/Bladder  To be continent of bowel/bladder with Mod I before d/c  Transfers  supervision  Locomotion  CGA  Communication     Cognition     Pain  no pain  Safety/Judgment  Able to call for help and express need   Therapy Plan: PT Intensity: Minimum of 1-2 x/day ,45 to 90 minutes PT Frequency: 5 out of 7 days PT Duration Estimated Length of Stay: 2 weeks OT Intensity: Minimum of 1-2 x/day, 45 to 90 minutes OT Frequency: 5 out of 7 days OT Duration/Estimated Length of Stay: 2 weeks     Due to the current state of emergency, patients may not be receiving their 3-hours of Medicare-mandated therapy.   Team Interventions: Nursing Interventions Patient/Family Education, Bladder Management, Bowel Management, Disease Management/Prevention, Pain Management, Medication Management, Skin Care/Wound Management, Discharge Planning  PT interventions Ambulation/gait training,  DME/adaptive equipment instruction, Psychosocial support, UE/LE Strength taining/ROM, UE/LE Coordination activities, Skin care/wound management, Functional electrical stimulation, Balance/vestibular training, Cognitive remediation/compensation, Functional mobility training, Splinting/orthotics, Visual/perceptual remediation/compensation, Wheelchair propulsion/positioning, Stair training, Neuromuscular re-education, Community reintegration, Discharge planning, Pain management, Therapeutic Activities, Therapeutic Exercise, Patient/family education, Disease management/prevention  OT Interventions Training and development officer, Community reintegration, Discharge planning, Disease mangement/prevention, Engineer, drilling, Functional mobility training, Neuromuscular re-education, Patient/family education, Pain management, Self Care/advanced ADL retraining, Skin care/wound managment, Splinting/orthotics, Therapeutic Activities, Therapeutic Exercise, UE/LE Strength taining/ROM, UE/LE Coordination activities, Wheelchair propulsion/positioning  SLP Interventions    TR Interventions    SW/CM Interventions Discharge Planning, Psychosocial Support, Patient/Family Education   Barriers to Discharge MD  Medical stability  Nursing Inaccessible home environment, Decreased caregiver support, Home environment access/layout, Wound Care, Lack of/limited family support, Weight, Medication compliance, Nutrition means    PT Decreased caregiver support, Home environment Child psychotherapist, Insurance for SNF coverage, Lack of/limited family support, New diabetic, Weight    OT Decreased caregiver support    SLP      SW Decreased caregiver support Currently does not have 24 hr care   Team Discharge Planning: Destination: PT-Home ,OT- Home , SLP-Home Projected Follow-up: PT-Home health PT, 24 hour supervision/assistance, OT-  Home health OT, SLP-None Projected Equipment Needs: PT-To be determined, OT- To be  determined, SLP-  Equipment Details: PT-Pt owns rollator only, OT-  Patient/family involved in discharge planning: PT- Patient,  OT-Patient, SLP-Patient  MD ELOS: 2-3 weeks Medical Rehab Prognosis:  Excellent Assessment: Brett Williamson is an 79 year old man who was admitted to  CIR with decreased functional mobility with generalized weakness as well as reported multiple falls secondary to traumatic right side subdural hematoma. Status post craniotomy hematoma evacuation 06/19/2020. Pain is very well controlled. He is continued on Keppra for seizure prophylaxis, he is having a daily BM, CBGs were uncontrolled on admission and dietary consult was ordered for education. On insulin sliding scale. He has completed course of IV antibiotics for gram-negative rod UTI. BP is being monitored TID given HTN and we will resume his home anti-hypertensives as needed.      See Team Conference Notes for weekly updates to the plan of care

## 2020-06-28 NOTE — Progress Notes (Signed)
[place] PHYSICAL MEDICINE & REHABILITATION PROGRESS NOTE   Subjective/Complaints: No complaints this morning. Denies pain, constipation, insomnia.  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   ROS: +BM last night  Objective:   No results found. Recent Labs    06/26/20 0549  WBC 9.6  HGB 12.9*  HCT 39.3  PLT 231   Recent Labs    06/26/20 0549  NA 134*  K 3.8  CL 103  CO2 21*  GLUCOSE 161*  BUN 28*  CREATININE 1.45*  CALCIUM 8.7*    Intake/Output Summary (Last 24 hours) at 06/28/2020 2012 Last data filed at 06/28/2020 1821 Gross per 24 hour  Intake 711 ml  Output --  Net 711 ml     Pressure Injury 06/25/20 Sacrum Mid Stage 1 -  Intact skin with non-blanchable redness of a localized area usually over a bony prominence. area to mid-sacrum (Active)  06/25/20 1530  Location: Sacrum  Location Orientation: Mid  Staging: Stage 1 -  Intact skin with non-blanchable redness of a localized area usually over a bony prominence.  Wound Description (Comments): area to mid-sacrum  Present on Admission: Yes     Pressure Injury 06/25/20 Heel Right;Left;Posterior Stage 1 -  Intact skin with non-blanchable redness of a localized area usually over a bony prominence. Right and left heel extremely slow to blanch. Will place foam dressing to both. (Active)  06/25/20 1515  Location: Heel  Location Orientation: Right;Left;Posterior  Staging: Stage 1 -  Intact skin with non-blanchable redness of a localized area usually over a bony prominence.  Wound Description (Comments): Right and left heel extremely slow to blanch. Will place foam dressing to both.  Present on Admission: Yes    Physical Exam: Vital Signs Blood pressure 101/62, pulse 60, temperature 97.8 F (36.6 C), temperature source Oral, resp. rate 18, height 5\' 11"  (1.803 m), weight 117.4 kg, SpO2 100 %. General: Alert and oriented x 3, No apparent distress HEENT: Head is normocephalic,  atraumatic, PERRLA, EOMI, sclera anicteric, oral mucosa pink and moist, dentition intact, ext ear canals clear,  Neck: Supple without JVD or lymphadenopathy Heart: Reg rate and rhythm. No murmurs rubs or gallops Chest: CTA bilaterally without wheezes, rales, or rhonchi; no distress Abdomen: Soft, non-tender, non-distended, bowel sounds positive. Extremities: No clubbing, cyanosis, or edema. Pulses are 2+  Skin: Craniotomy site clean and dry, stage 1 pressure injury on sacrum.  Musculoskeletal:  Cervical back: Normal range of motion. Norigidity.  Comments: 4-/5 in UEs- and equal- biceps, triceps, WE and grip 4-/5 in LEs- HF, KE, and PF- DF 2/5 B/L- and has decreased ROM of DF B/L as well Also has large scar on R 2nd digit- swollen hands, stiff, "chronic". 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 Oriented to person place and time. Follows simple commands.  Decreased sensation to light touch in stocking/glove distribution- normal for him   Assessment/Plan: 1. Functional deficits which require 3+ hours per day of interdisciplinary therapy in a comprehensive inpatient rehab setting.  Physiatrist is providing close team supervision and 24 hour management of active medical problems listed below.  Physiatrist and rehab team continue to assess barriers to discharge/monitor patient progress toward functional and medical goals  Care Tool:  Bathing    Body parts bathed by patient: Right arm, Left  arm, Chest, Abdomen, Face, Left upper leg, Right upper leg, Right lower leg, Left lower leg   Body parts bathed by helper: Front perineal area, Buttocks     Bathing assist Assist Level: Minimal Assistance - Patient > 75%     Upper Body Dressing/Undressing Upper body dressing   What is the patient  wearing?: Pull over shirt    Upper body assist Assist Level: Minimal Assistance - Patient > 75%    Lower Body Dressing/Undressing Lower body dressing      What is the patient wearing?: Pants     Lower body assist Assist for lower body dressing: Total Assistance - Patient < 25%     Toileting Toileting    Toileting assist Assist for toileting: Moderate Assistance - Patient 50 - 74%     Transfers Chair/bed transfer  Transfers assist     Chair/bed transfer assist level: Contact Guard/Touching assist     Locomotion Ambulation   Ambulation assist      Assist level: Contact Guard/Touching assist Assistive device: Rollator Max distance: 70   Walk 10 feet activity   Assist     Assist level: Contact Guard/Touching assist Assistive device: Rollator   Walk 50 feet activity   Assist    Assist level: Contact Guard/Touching assist Assistive device: Rollator    Walk 150 feet activity   Assist Walk 150 feet activity did not occur: Safety/medical concerns (fatigue)         Walk 10 feet on uneven surface  activity   Assist Walk 10 feet on uneven surfaces activity did not occur: Safety/medical concerns         Wheelchair     Assist Will patient use wheelchair at discharge?: No Type of Wheelchair: Manual    Wheelchair assist level: Supervision/Verbal cueing Max wheelchair distance: 30'    Wheelchair 50 feet with 2 turns activity    Assist            Wheelchair 150 feet activity     Assist          Blood pressure 101/62, pulse 60, temperature 97.8 F (36.6 C), temperature source Oral, resp. rate 18, height 5\' 11"  (1.803 m), weight 117.4 kg, SpO2 100 %.  Medical Problem List and Plan: 1.  Decreased functional mobility with generalized weakness as well as reported multiple falls secondary to traumatic right side subdural hematoma.  Status post craniotomy hematoma evacuation 06/19/2020             -patient may not shower              -ELOS/Goals: S 2-3 weeks  -Performed well on cognitive testing  -Continue CIR.  2.  Antithrombotics: -DVT/anticoagulation: SCDs.             -antiplatelet therapy: N/A 3. Pain Management: Hydrocodone as needed. Pain is well controlled.  4. Mood: Provide emotional support             -antipsychotic agents: N/A 5. Neuropsych: This patient is capable of making decisions on his own behalf. 6. Skin/Wound Care: Gerhardt Butt cream applied to buttocks scrotum perineal area after cleansing and drying 7. Fluids/Electrolytes/Nutrition: Routine in and outs with follow-up chemistries 8.  Seizure prophylaxis.  Continue Keppra 500 mg twice daily 9.  Gram-negative rod UTI.  Rocephin started 06/20/2020 and stopped 11/17 after completed course.  10.  History of hypertension.  Patient on Lotensin 20 mg daily and HCTZ 25 mg daily prior to admission.  Resume as needed. BP has  been labile.  11.  Diet-controlled diabetes mellitus.  Hemoglobin A1c 6.6.  SSI.  11/16: CBG elevated to 206 last night. RD consult ordered for education. Control has been better.  12. Constipation: having daily BM.    LOS: 3 days A FACE TO FACE EVALUATION WAS PERFORMED  Brett Williamson Brett Williamson 06/28/2020, 8:12 PM

## 2020-06-28 NOTE — Progress Notes (Signed)
Occupational Therapy Session Note  Patient Details  Name: Brett Williamson MRN: [id number] Date of Birth: 02-01-1941  Today's Date: 06/28/2020  Session 1 OT Individual Time: 7510-2585 OT Individual Time Calculation (min): 60 min   Session 2 OT Individual Time: 1248-1400 OT Individual Time Calculation (min): 72 min    Short Term Goals: Week 1:  OT Short Term Goal 1 (Week 1): Pt will tolerate standing for 3 minutes wiithin BADL task OT Short Term Goal 2 (Week 1): Pt will be given built up handles to increase independence with self-feeding and grooming tasks OT Short Term Goal 3 (Week 1): Pt will complete toilet transfer with min A and LRAD  Skilled Therapeutic Interventions/Progress Updates:  Session 1   Pt greeted semi-reclined in bed and agreeable to OT treatment session focused on self-care retraining. Pt declined to shower today, but wanted to sponge bathe at the sink. Pt completed bed mobility w/ head of bed raised and heavy use of rails with CGA. Pt then needed OT assist for donning socks 2/2 urgency and OT needing to get pt sock-aid. Pt reports using sock aid at home. Pt completed sit<>stand with rollator and CGA. He ambulated into bathroom w/ CGA and verbal ceus for safety when turning to sit onto raised commode. Pt with continent void of bladder. Pt then ambulated to the sink in similar fashion for bathing/dressing. OT issued LH sponge to increase access to LB. Pt was able to wash LEs and assist with washing buttocks using LH sponge. Pt needed OT assist to cleanse bottom more thoroughly with smear of BM  Noted and pt unaware. Multiple sit<>stands within BADL tasks with CGA. CGA for balance when standing to pull up pants. Pt ambulated to recliner at end of session with rollator and CGA. Pt left with alarm belt on, call bell in reach, and needs met.   Session 2 Pt greeted seated in recliner with lunch tray, but had not begun eating yet 2/2 needing help opening condiments. OT assisted with  opening mayo packets and spreading onto sandwich. 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 Pt with improved grasp on utensils. Discussed modifications for opening containers in home environment. Pt declined to go to the bathroom and ambulated 40 ft w/ rollator and close supervision. CGA for turning to sit in wc. Worked on standing balance/endurance and UB there-ex with standing on foam and performing 3 sets of 10 chest press, straight arm raises, and bicep curls. Pt with occasional posterior LOB requiring min A and verbal cues for balance strategies. Worked on standing balance again with Wii bowling activity. Pt with difficulty holding trigger down to bowl ball 2/2 finger contracture. Pt discussed with OT his modifications he has for his personal bowling ball to allow him to maintain grip on ball. Pt returned to room and  Ambulated in the room to return to recliner with close supervision. Pt left with alarm belt on, call bell in reach, and needs met.    Therapy Documentation Precautions:  Precautions Precautions: Fall Restrictions Weight Bearing Restrictions: No Pain: Pain Assessment Pain Scale: 0-10 Pain Score: 0-No pain  Therapy/Group: Individual Therapy  Valma Cava 06/28/2020, 2:03 PM

## 2020-06-28 NOTE — Progress Notes (Signed)
Physical Therapy Session Note  Patient Details  Name: Brett Williamson MRN: [id number] Date of Birth: September 17, 1940  Today's Date: 06/28/2020 PT Individual Time: 1045-1200 PT Individual Time Calculation (min): 75 min   Short Term Goals: Week 1:  PT Short Term Goal 1 (Week 1): Pt will complete bed mobility with minA without bed features PT Short Term Goal 2 (Week 1): Pt will complete sit<>stand transfers with minA and LRAD PT Short Term Goal 3 (Week 1): Pt will perform bed<>chair transfers with minA and LRAD PT Short Term Goal 4 (Week 1): Pt will ambulate 17ft with minA and LRAD Week 2:    Week 3:     Skilled Therapeutic Interventions/Progress Updates:     PAIN rates as 3-4/10 bilat knees, treatment to tolerance  Seen for 75 min session w/focus on functional transfers,  Balance, gait training   Pt initially oob in recliner requesting to use BR. Sit to stand w/cga to rollator, gait x 4ft/commode transfer w/cga, cues for posture, cues for sequencing. Pt requires total assist to lower pants and brief and for brief change/raising paints after commode use.  Pt w/wet brief, able to void while on commode.  Requires cga for standing balance during clothing management.  Able to stand 3 min w/cga and UE support on walker/grab bar. Brief provided by NT found to be too small for pt.  Gait 50ft to wc, turn/sit w/cues to lock brakes on rollator prior to sitting, cga. Therapist obtained larger brief.  Repeated standing and changing brief as above.  Pt transported to gym for continued session.   Gait x 30ft w/rollator, cga, cues for posture and positioning w/walker  Balance: Static stand without Ue support Cervical nods - cga increased sway Cervical rotation - cga increased sway Eyes closed - min assist to mod assist, moderate increased sway w/very delayed reactions/post tendency  Functional gait - 50ft x 2 requiring mult turns/weaving around cones w/rollator and cga, no balance loss, cues for  posture. Repeated course x 2.  Therex: Attempted LAQs w/wt but pt c/o pain in knees w/this Nustep: wc to/from Nustep stand pivot transfer w/rollator w/cga. Pt set up by therapist and completes 3 min at resistance L 3 but again c/o knee pain w/activity, therefore discontinued.  Gait 48ft, turn/sit to recliner w/cga using rollator, cues for safety. Pt left oob in recliner w/chair alarm set and needs in reach.  Pt w/significant improvements in functional mobility and balance, needs cues to lock brakes on rollator w/transitions.    Therapy Documentation Precautions:  Precautions Precautions: Fall Restrictions Weight Bearing Restrictions: No    Therapy/Group: Individual Therapy  Callie Fielding, [place] 06/28/2020, 12:18 PM

## 2020-06-29 ENCOUNTER — Encounter (HOSPITAL_COMMUNITY): Payer: Self-pay | Admitting: Psychology

## 2020-06-29 ENCOUNTER — Inpatient Hospital Stay (HOSPITAL_COMMUNITY): Payer: Self-pay | Admitting: Occupational Therapy

## 2020-06-29 ENCOUNTER — Inpatient Hospital Stay (HOSPITAL_COMMUNITY): Payer: Self-pay

## 2020-06-29 DIAGNOSIS — S065X0S Traumatic subdural hemorrhage without loss of consciousness, sequela: Secondary | ICD-10-CM

## 2020-06-29 LAB — GLUCOSE, CAPILLARY
Glucose-Capillary: 137 mg/dL — ABNORMAL HIGH (ref 70–99)
Glucose-Capillary: 165 mg/dL — ABNORMAL HIGH (ref 70–99)
Glucose-Capillary: 158 mg/dL — ABNORMAL HIGH (ref 70–99)
Glucose-Capillary: 180 mg/dL — ABNORMAL HIGH (ref 70–99)

## 2020-06-29 NOTE — Progress Notes (Signed)
Patient ID: Brett Williamson, male   DOB: 04/05/1941, 79 y.o.   MRN: [id number]  SW provided pt with HHA list. SW to follow-up about HHA preference.   Loralee Pacas, MSW, [place] Office: [phone] Cell: [phone] Fax: [phone]

## 2020-06-29 NOTE — Progress Notes (Signed)
[place] PHYSICAL MEDICINE & REHABILITATION PROGRESS NOTE   Subjective/Complaints: No complaints this morning. He is reading a book while awaiting for therapy to start. Commeded him on his hard work during therapy. Pain is very well controlled.   ROS: denies pain  Objective:   No results found. No results for input(s): WBC, HGB, HCT, PLT in the last 72 hours. No results for input(s): NA, K, CL, CO2, GLUCOSE, BUN, CREATININE, CALCIUM in the last 72 hours.  Intake/Output Summary (Last 24 hours) at 06/29/2020 1111 Last data filed at 06/29/2020 0725 Gross per 24 hour  Intake 711 ml  Output 200 ml  Net 511 ml     Pressure Injury 06/25/20 Sacrum Mid Stage 1 -  Intact skin with non-blanchable redness of a localized area usually over a bony prominence. area to mid-sacrum (Active)  06/25/20 1530  Location: Sacrum  Location Orientation: Mid  Staging: Stage 1 -  Intact skin with non-blanchable redness of a localized area usually over a bony prominence.  Wound Description (Comments): area to mid-sacrum  Present on Admission: Yes     Pressure Injury 06/25/20 Heel Right;Left;Posterior Stage 1 -  Intact skin with non-blanchable redness of a localized area usually over a bony prominence. Right and left heel extremely slow to blanch. Will place foam dressing to both. (Active)  06/25/20 1515  Location: Heel  Location Orientation: Right;Left;Posterior  Staging: Stage 1 -  Intact skin with non-blanchable redness of a localized area usually over a bony prominence.  Wound Description (Comments): Right and left heel extremely slow to blanch. Will place foam dressing to both.  Present on Admission: Yes    Physical Exam: Vital Signs Blood pressure 115/64, pulse 66, temperature 98 F (36.7 C), temperature source Oral, resp. rate 17, height 5\' 11"  (1.803 m), weight 117.4 kg, SpO2 96 %.  General: Alert and oriented x 3, No apparent distress HEENT: Head is normocephalic, atraumatic, PERRLA,  EOMI, sclera anicteric, oral mucosa pink and moist, dentition intact, ext ear canals clear,  Neck: Supple without JVD or lymphadenopathy Heart: Reg rate and rhythm. No murmurs rubs or gallops Chest: CTA bilaterally without wheezes, rales, or rhonchi; no distress Abdomen: Soft, non-tender, non-distended, bowel sounds positive. Extremities: No clubbing, cyanosis, or edema. Pulses are 2+  Skin: Craniotomy site clean and dry, stage 1 pressure injury on sacrum.  Musculoskeletal:  Cervical back: Normal range of motion. Norigidity.  Comments: 4-/5 in UEs- and equal- biceps, triceps, WE and grip 4-/5 in LEs- HF, KE, and PF- DF 2/5 B/L- and has decreased ROM of DF B/L as well Also has large scar on R 2nd digit- swollen hands, stiff, "chronic". 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 Oriented to person place and time. Follows simple commands.  Decreased sensation to light touch in stocking/glove distribution- normal for him   Assessment/Plan: 1. Functional deficits which require 3+ hours per day of interdisciplinary therapy in a comprehensive inpatient rehab setting.  Physiatrist is providing close team supervision and 24 hour management of active medical problems listed below.  Physiatrist and rehab team continue to assess barriers to discharge/monitor patient progress toward functional and medical goals  Care Tool:  Bathing    Body parts bathed by patient: Right arm, Left arm, Chest, Abdomen, Face, Left upper leg, Right upper leg, Right lower leg, Left lower leg  Body parts bathed by helper: Front perineal area, Buttocks     Bathing assist Assist Level: Minimal Assistance - Patient > 75%     Upper Body Dressing/Undressing Upper body dressing   What is the patient wearing?: Pull over  shirt    Upper body assist Assist Level: Minimal Assistance - Patient > 75%    Lower Body Dressing/Undressing Lower body dressing      What is the patient wearing?: Pants     Lower body assist Assist for lower body dressing: Total Assistance - Patient < 25%     Toileting Toileting    Toileting assist Assist for toileting: Moderate Assistance - Patient 50 - 74%     Transfers Chair/bed transfer  Transfers assist     Chair/bed transfer assist level: Contact Guard/Touching assist     Locomotion Ambulation   Ambulation assist      Assist level: Contact Guard/Touching assist Assistive device: Rollator Max distance: 70   Walk 10 feet activity   Assist     Assist level: Contact Guard/Touching assist Assistive device: Rollator   Walk 50 feet activity   Assist    Assist level: Contact Guard/Touching assist Assistive device: Rollator    Walk 150 feet activity   Assist Walk 150 feet activity did not occur: Safety/medical concerns (fatigue)         Walk 10 feet on uneven surface  activity   Assist Walk 10 feet on uneven surfaces activity did not occur: Safety/medical concerns         Wheelchair     Assist Will patient use wheelchair at discharge?: No Type of Wheelchair: Manual    Wheelchair assist level: Supervision/Verbal cueing Max wheelchair distance: 30'    Wheelchair 50 feet with 2 turns activity    Assist            Wheelchair 150 feet activity     Assist          Blood pressure 115/64, pulse 66, temperature 98 F (36.7 C), temperature source Oral, resp. rate 17, height 5\' 11"  (1.803 m), weight 117.4 kg, SpO2 96 %.  Medical Problem List and Plan: 1.  Decreased functional mobility with generalized weakness as well as reported multiple falls secondary to traumatic right side subdural hematoma.  Status post craniotomy hematoma evacuation 06/19/2020             -patient may not shower             -ELOS/Goals:  S 2-3 weeks  -Performed well on cognitive testing  -Continue CIR.  2.  Antithrombotics: -DVT/anticoagulation: SCDs.             -antiplatelet therapy: N/A 3. Pain Management: Hydrocodone as needed. Pain is well controlled.  4. Mood: Provide emotional support             -antipsychotic agents: N/A 5. Neuropsych: This patient is capable of making decisions on his own behalf. Appreciate neuropsych eval.  6. Skin/Wound Care: Gerhardt Butt cream applied to buttocks scrotum perineal area after cleansing and drying 7. Fluids/Electrolytes/Nutrition: Routine in and outs with follow-up chemistries 8.  Seizure prophylaxis.  Continue Keppra 500 mg twice daily 9.  Gram-negative rod UTI.  Rocephin started 06/20/2020 and stopped 11/17 after completed course.  10.  History of hypertension.  Patient on Lotensin 20 mg daily and HCTZ 25 mg daily prior to admission.  Resume as needed. BP has been well controlled 11.  Diet-controlled diabetes mellitus.  Hemoglobin A1c 6.6.  SSI.  RD education requested. High of 195 on 11/18.  12. Constipation: having daily BM.    LOS: 4 days A FACE TO FACE EVALUATION WAS PERFORMED  Jameek Bruntz P Lynise Porr 06/29/2020, 11:11 AM

## 2020-06-29 NOTE — Progress Notes (Signed)
Physical Therapy Session Note  Patient Details  Name: Brett Williamson MRN: [id number] Date of Birth: Nov 08, 1940  Today's Date: 06/29/2020 PT Individual Time: 1345-1500 PT Individual Time Calculation (min): 75 min   Short Term Goals: Week 1:  PT Short Term Goal 1 (Week 1): Pt will complete bed mobility with minA without bed features PT Short Term Goal 2 (Week 1): Pt will complete sit<>stand transfers with minA and LRAD PT Short Term Goal 3 (Week 1): Pt will perform bed<>chair transfers with minA and LRAD PT Short Term Goal 4 (Week 1): Pt will ambulate 15ft with minA and LRAD  Skilled Therapeutic Interventions/Progress Updates:    Patient in recliner in room.  Sit to stand with CGA to rollator and ambulated to bathroom CGA.  Seated for toileting with mod A for clothing management.  Patient ambulaed 56' with Rollator with CGA w/c following.  Patient assisted in w/c to therapy gym.  Standing balance task to reach and place checkers on stools to R and L with 1 UE support and CGA/S.  Patient assisted in w/c to room.  Standing balance in corner without UE support feet apart S x 30 sec, then with eyes closed feet apart 20 sec close S.  Then feet closer eyes open 30 sec.  Seated on EOB and sit to supine S.  Performed supine therex: SAQ, quad sets, heel slides, SLR w/ quad set, hooklying hip abduction with green t-band, hooklying hip adduction into pillow then bridging all 5-10 reps x 2 sets.  Supine to sit S.  Patient ambulated in room around trashcan and chair with rollator with close S to [place] many turns and no LOB.  Left seated in recliner needs in reach, seat alarm activated.   Therapy Documentation Precautions:  Precautions Precautions: Fall Restrictions Weight Bearing Restrictions: No Pain: Pain Assessment Pain Score: 5  Pain Type: Chronic pain Pain Location: Knee Pain Orientation: Right;Left Pain Onset: On-going Pain Intervention(s): Rest    Therapy/Group: Individual Therapy  Reginia Naas  Magda Kiel, PT 06/29/2020, 5:37 PM

## 2020-06-29 NOTE — Progress Notes (Signed)
Occupational Therapy Session Note  Patient Details  Name: Brett Williamson MRN: [id number] Date of Birth: September 08, 1940  Today's Date: 06/29/2020  Session 1 OT Individual Time: 3354-5625 OT Individual Time Calculation (min): 58 min   Session 2 OT Individual Time: 1100-1200 OT Individual Time Calculation (min): 60 min   Short Term Goals: Week 1:  OT Short Term Goal 1 (Week 1): Pt will tolerate standing for 3 minutes wiithin BADL task OT Short Term Goal 2 (Week 1): Pt will be given built up handles to increase independence with self-feeding and grooming tasks OT Short Term Goal 3 (Week 1): Pt will complete toilet transfer with min A and LRAD  Skilled Therapeutic Interventions/Progress Updates:    Pt greeted semi-reclined in bed and agreeable to OT treatment session. Pt completed bed mobility with head of bed raised and supervision. Sit<>stand from EOB with rollator and CGA. Pt ambulated into bathroom and needed min A for clothing management prior to sitting on commode. Pt requested towel to cover peri-area as to not spill urine on floor. Pt then ambulated to recliner for dressing. Pt was able to don shirt w/ set-up A and increased time 2/ 2 limited dexterity in hands at baseline. Pt then was able to thread pant legs today with increased time. CGA sit<>stand and CGA for balance when pulling up pants 2/2 slight posterior LOB. Pt then ambulated to therapy gym using rollator and increased time-CGA. Worked on standing balance/endurance with standing horse shoe toss activity. Worked on balance with pt taking small step with follow through toss. CGA for balance. Educated on use of rollator to sit and rest when fatigued. Discussed energy conservation techniques and practiced resting with rollator. Pt ambulated back to room and left seated in recliner with alarm belt on, call bell in reach, and needs met.   Session 2 Pt greeted seated in recliner and agreeable to OT treatment session. Pt reported need to  urinate. Pt ambulated to bathroom w/ rollator and CGA. Pt able to manage clothing to sit on commode. Pt had been incontinent of bladder with smear of BM in brief. Pt able to void more in commode and had large BM. Pt needed OT assist for peri-care after BM. Pt reports he has a toilet aid and then uses wash cloth between legs to cleanse self at home. Pt needed OT assist also for clothing management. Pt reported B knees sore from ambulating earlier and declined further ambulation. Pt brought to therapy gym in wc. Worked on standing balance and endurance with cornhole toss. Pt needed overall close supervision and tolerated standing bouts of ~4 minutes at a time. UB there-ex with beach ball volley using 2 lb dowel rod. Pt returned to room and left seated in recliner with alarm belt on, call bell in reach, and needs met   Therapy Documentation Precautions:  Precautions Precautions: Fall Restrictions Weight Bearing Restrictions: No Pain:  Pt reports mild pain in B knees- baseline arthritis. No number given. Rest and repositioned for pain management.   Therapy/Group: Individual Therapy  Valma Cava 06/29/2020, 12:25 PM

## 2020-06-29 NOTE — Consult Note (Signed)
Neuropsychological Consultation   Patient:   Brett Williamson   DOB:   [birth year]  MR Number:  [id number]  Location:  [facility] [address] C[facility] [address] [place] [id number] [place] [address] Dept: [place]: [phone]           Date of Service:   06/29/2020  Start Time:   9 AM End Time:   10 AM  Provider/Observer:  Ilean Skill, Psy.D.       Clinical Neuropsychologist       Billing Code/Service: 67341  Chief Complaint:    Brett Williamson is an 79 year old male with history of hypertension, diabetes.  Patient had been living alone and independent prior to admission and used a Rollator as well as a lift chair to assist with ambulation and getting out of a chair.  The patient had prior falls.  Patient was admitted on 06/19/2020 after reports of frequent falls as well as generalized weakness.  There was no report of loss of consciousness.  CT head showed moderately large right-sided subdural hematoma with 8 mm leftward midline shift.  Patient underwent craniotomy hematoma evacuation on 06/19/2020.  Follow-up MRI on 06/21/2020 showed interval decompression of subdural hematoma with significant improved mass-effect.  Midline shift had decreased to 2 mm.  Was later referred to the comprehensive inpatient rehabilitation due to functional decline.  Reason for Service:  Patient was referred for neuropsychological consultation due to coping adjustment issues with extended hospital stay and recovery from a right hemispheric subdural hematoma.  Below is the HPI for the current admission.  HPI: Brett Williamson is an 79 year old right-handed male with history of hypertension, diabetes mellitus.  Per chart review patient lives alone independent prior to admission using a Rollator.  Apartment with one level.  He has a granddaughter girlfriend in the area that check on him routinely.  Presented 06/19/2020 with reports of frequent falls as well as  generalized weakness.  No reported loss of consciousness.  CT the head showed moderately large right-sided subdural hematoma with 8 mm leftward midline shift.  Admission chemistry sodium 130 glucose 177 BUN 29 creatinine 1.54 CK 86 hemoglobin 15.3, WBC 14,800, urine culture greater than 100,000 gram-negative rods placed on Rocephin, lactic acid 1.4.  Patient underwent craniotomy hematoma evacuation 06/19/2020 per Dr. Vertell Limber.  Follow-up MRI 06/21/2020 showed interval decompression of subdural hematoma with significantly improved mass-effect.  Midline shift decreased to 3 mm.  Echocardiogram ejection fraction 55 to 60% no wall motion abnormalities grade 2 diastolic dysfunction.  Maintained on Keppra for seizure prophylaxis.  Wound care nurse follow-up for multiple wounds on scrotum buttocks perineum wound care as directed.  Therapy evaluations completed and patient was admitted for a comprehensive rehab program.  Current Status:  Upon entering the room the patient was sitting in the reclining chair in the full upright position with reminder strap deployed.  Patient was alert and oriented with good expressive and receptive language capacity.  Patient appeared to have good cognition and mental status.  He was aware of what it happened and was able to give a good description of how he had been functioning prior to and after his subdural hematoma.  Patient reports that left-sided motor function and sensory perceptual functioning were all within normal limits and appear to return to baseline.  The patient denied being clear on any specific changes or impairments that have developed post subdural hematoma.  The patient did acknowledge significant weakness and increasing frequency of falls.  The patient reports that he had begun using a lift chair to assist getting in and out of chair.  Patient reports that he had had a significant fall in September and had a series of falls on the day that he had his cerebrovascular  accident.  Patient denies any significant depression or anxiety type symptoms and reports that he is generally coping well with extended hospital stay and understands the needs to build up strength and other therapies to reduce his risk of fall and get him started moving forward to maintain independence in the future.  Behavioral Observation: Brett Williamson  presents as a 79 y.o.-year-old Right Caucasian Male who appeared his stated age. his dress was Appropriate and he was Well Groomed and his manners were Appropriate to the situation.  his participation was indicative of Appropriate and Attentive behaviors.  There were any physical disabilities noted.  he displayed an appropriate level of cooperation and motivation.     Interactions:    Active Appropriate and Attentive  Attention:   within normal limits and attention span and concentration were age appropriate  Memory:   within normal limits; recent and remote memory intact  Visuo-spatial:  not examined  Speech (Volume):  normal  Speech:   normal; normal  Thought Process:  Coherent and Relevant  Though Content:  WNL; not suicidal and not homicidal  Orientation:   person, place, time/date and situation  Judgment:   Good  Planning:   Good  Affect:    Appropriate  Mood:    Euthymic  Insight:   Good  Intelligence:   normal  Medical History:   Past Medical History:  Diagnosis Date  . Diabetes ([place])   . Frequent falls   . HTN (hypertension)     Psychiatric History:  No prior psychiatric history  Family Med/Psych History: History reviewed. No pertinent family history.  Impression/DX:  Brett Williamson is an 79 year old male with history of hypertension, diabetes.  Patient had been living alone and independent prior to admission and used a Rollator as well as a lift chair to assist with ambulation and getting out of a chair.  The patient had prior falls.  Patient was admitted on 06/19/2020 after reports of frequent falls as well as  generalized weakness.  There was no report of loss of consciousness.  CT head showed moderately large right-sided subdural hematoma with 8 mm leftward midline shift.  Patient underwent craniotomy hematoma evacuation on 06/19/2020.  Follow-up MRI on 06/21/2020 showed interval decompression of subdural hematoma with significant improved mass-effect.  Midline shift had decreased to 2 mm.  Was later referred to the comprehensive inpatient rehabilitation due to functional decline.  Upon entering the room the patient was sitting in the reclining chair in the full upright position with reminder strap deployed.  Patient was alert and oriented with good expressive and receptive language capacity.  Patient appeared to have good cognition and mental status.  He was aware of what it happened and was able to give a good description of how he had been functioning prior to and after his subdural hematoma.  Patient reports that left-sided motor function and sensory perceptual functioning were all within normal limits and appear to return to baseline.  The patient denied being clear on any specific changes or impairments that have developed post subdural hematoma.  The patient did acknowledge significant weakness and increasing frequency of falls.  The patient reports that he had begun using a lift chair to assist getting in and out  of chair.  Patient reports that he had had a significant fall in September and had a series of falls on the day that he had his cerebrovascular accident.  Patient denies any significant depression or anxiety type symptoms and reports that he is generally coping well with extended hospital stay and understands the needs to build up strength and other therapies to reduce his risk of fall and get him started moving forward to maintain independence in the future.  Diagnosis:    SDH Right        Electronically Signed   _______________________ Ilean Skill, Psy.D.

## 2020-06-30 DIAGNOSIS — S065X1S Traumatic subdural hemorrhage with loss of consciousness of 30 minutes or less, sequela: Secondary | ICD-10-CM

## 2020-06-30 LAB — GLUCOSE, CAPILLARY
Glucose-Capillary: 119 mg/dL — ABNORMAL HIGH (ref 70–99)
Glucose-Capillary: 131 mg/dL — ABNORMAL HIGH (ref 70–99)
Glucose-Capillary: 127 mg/dL — ABNORMAL HIGH (ref 70–99)
Glucose-Capillary: 213 mg/dL — ABNORMAL HIGH (ref 70–99)

## 2020-06-30 NOTE — Progress Notes (Signed)
[facility] PHYSICAL MEDICINE & REHABILITATION PROGRESS NOTE   Subjective/Complaints:  Slept ok, no HA, good appetite, discussed LE swellin (chronic )  ROS: denies pain  Objective:   No results found. No results for input(s): WBC, HGB, HCT, PLT in the last 72 hours. No results for input(s): NA, K, CL, CO2, GLUCOSE, BUN, CREATININE, CALCIUM in the last 72 hours.  Intake/Output Summary (Last 24 hours) at 06/30/2020 0818 Last data filed at 06/30/2020 6222 Gross per 24 hour  Intake 831 ml  Output 200 ml  Net 631 ml     Pressure Injury 06/25/20 Sacrum Mid Stage 1 -  Intact skin with non-blanchable redness of a localized area usually over a bony prominence. area to mid-sacrum (Active)  06/25/20 1530  Location: Sacrum  Location Orientation: Mid  Staging: Stage 1 -  Intact skin with non-blanchable redness of a localized area usually over a bony prominence.  Wound Description (Comments): area to mid-sacrum  Present on Admission: Yes     Pressure Injury 06/25/20 Heel Right;Left;Posterior Stage 1 -  Intact skin with non-blanchable redness of a localized area usually over a bony prominence. Right and left heel extremely slow to blanch. Will place foam dressing to both. (Active)  06/25/20 1515  Location: Heel  Location Orientation: Right;Left;Posterior  Staging: Stage 1 -  Intact skin with non-blanchable redness of a localized area usually over a bony prominence.  Wound Description (Comments): Right and left heel extremely slow to blanch. Will place foam dressing to both.  Present on Admission: Yes    Physical Exam: Vital Signs Blood pressure (!) 115/58, pulse 67, temperature 98.3 F (36.8 C), temperature source Oral, resp. rate 18, height 5\' 11"  (1.803 m), weight 117.4 kg, SpO2 97 %.   General: No acute distress Mood and affect are appropriate Heart: Regular rate and rhythm no rubs murmurs or extra sounds Lungs: Clear to auscultation, breathing unlabored, no rales or  wheezes Abdomen: Positive bowel sounds, soft nontender to palpation, nondistended Extremities: No clubbing, cyanosis, or edema Skin: No evidence of breakdown, no evidence of rash   Musculoskeletal:  Cervical back: Normal range of motion. Norigidity.  Comments: 4-/5 in UEs- and equal- biceps, triceps, WE and grip 4-/5 in LEs- HF, KE, and PF- DF 2/5 B/L- and has decreased ROM of DF B/L as well Also has large scar on R 2nd digit- swollen hands, stiff, "chronic". 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 Oriented to person place and time. Follows simple commands.  Decreased sensation to light touch in stocking/glove distribution- normal for him   Assessment/Plan: 1. Functional deficits which require 3+ hours per day of interdisciplinary therapy in a comprehensive inpatient rehab setting.  Physiatrist is providing close team supervision and 24 hour management of active medical problems listed below.  Physiatrist and rehab team continue to assess barriers to discharge/monitor patient progress toward functional and medical goals  Care Tool:  Bathing    Body parts bathed by patient: Right arm, Left arm, Chest, Abdomen, Face, Left upper leg, Right upper leg, Right lower leg, Left lower leg   Body parts bathed by helper: Front perineal area, Buttocks     Bathing assist Assist Level: Minimal Assistance - Patient > 75%     Upper Body Dressing/Undressing Upper body dressing   What is the patient wearing?: Pull over shirt  Upper body assist Assist Level: Minimal Assistance - Patient > 75%    Lower Body Dressing/Undressing Lower body dressing      What is the patient wearing?: Pants     Lower body assist Assist for lower body dressing: Total Assistance - Patient < 25%      Toileting Toileting    Toileting assist Assist for toileting: Moderate Assistance - Patient 50 - 74%     Transfers Chair/bed transfer  Transfers assist     Chair/bed transfer assist level: Contact Guard/Touching assist     Locomotion Ambulation   Ambulation assist      Assist level: Contact Guard/Touching assist Assistive device: Rollator Max distance: 60'   Walk 10 feet activity   Assist     Assist level: Contact Guard/Touching assist Assistive device: Rollator   Walk 50 feet activity   Assist    Assist level: Contact Guard/Touching assist Assistive device: Rollator    Walk 150 feet activity   Assist Walk 150 feet activity did not occur: Safety/medical concerns (fatigue)         Walk 10 feet on uneven surface  activity   Assist Walk 10 feet on uneven surfaces activity did not occur: Safety/medical concerns         Wheelchair     Assist Will patient use wheelchair at discharge?: No Type of Wheelchair: Manual    Wheelchair assist level: Supervision/Verbal cueing Max wheelchair distance: 30'    Wheelchair 50 feet with 2 turns activity    Assist            Wheelchair 150 feet activity     Assist          Blood pressure (!) 115/58, pulse 67, temperature 98.3 F (36.8 C), temperature source Oral, resp. rate 18, height 5\' 11"  (1.803 m), weight 117.4 kg, SpO2 97 %.  Medical Problem List and Plan: 1.  Decreased functional mobility with generalized weakness as well as reported multiple falls secondary to traumatic right side subdural hematoma.  Status post craniotomy hematoma evacuation 06/19/2020             -patient may not shower             -ELOS/Goals: S 2-3 weeks  -Performed well on cognitive testing  -Continue CIR.  2.  Antithrombotics: -DVT/anticoagulation: SCDs.             -antiplatelet therapy: N/A 3. Pain Management: Hydrocodone as needed. Pain is well controlled.  4. Mood: Provide emotional support              -antipsychotic agents: N/A 5. Neuropsych: This patient is capable of making decisions on his own behalf. Appreciate neuropsych eval.  6. Skin/Wound Care: Gerhardt Butt cream applied to buttocks scrotum perineal area after cleansing and drying Stasis dermatitis Bilateral pretibial- discussed elevation (pt declines) as well as TED hose (pt declines) 7. Fluids/Electrolytes/Nutrition: Routine in and outs with follow-up chemistries 8.  Seizure prophylaxis.  Continue Keppra 500 mg twice daily 9.  Gram-negative rod UTI.  Rocephin started 06/20/2020 and stopped 11/17 after completed course.  Afebrile 11/20 10.  History of hypertension.  Patient on Lotensin 20 mg daily and HCTZ 25 mg daily prior to admission.  Resume as needed. BP has been well controlled Vitals:   06/29/20 1930 06/30/20 0458  BP: 114/62 (!) 115/58  Pulse: (!) 56 67  Resp: 18 18  Temp: 98 F (36.7 C) 98.3 F (36.8 C)  SpO2: 100% 97%   11.  Diet-controlled diabetes mellitus.  Hemoglobin A1c 6.6.  SSI. RD education requested. High of 195 on 11/18.  12. Constipation: having daily BM.    LOS: 5 days A FACE TO FACE EVALUATION WAS PERFORMED  Charlett Blake 06/30/2020, 8:18 AM

## 2020-07-01 ENCOUNTER — Inpatient Hospital Stay (HOSPITAL_COMMUNITY): Payer: Self-pay

## 2020-07-01 LAB — GLUCOSE, CAPILLARY
Glucose-Capillary: 119 mg/dL — ABNORMAL HIGH (ref 70–99)
Glucose-Capillary: 128 mg/dL — ABNORMAL HIGH (ref 70–99)
Glucose-Capillary: 174 mg/dL — ABNORMAL HIGH (ref 70–99)
Glucose-Capillary: 178 mg/dL — ABNORMAL HIGH (ref 70–99)

## 2020-07-01 NOTE — Progress Notes (Signed)
Physical Therapy Session Note  Patient Details  Name: Yader Criger MRN: [id number] Date of Birth: 1941/05/13  Today's Date: 07/01/2020 PT Individual Time: 1050-1200 and 1422-1505 PT Individual Time Calculation (min): 70 min and 43 min   Short Term Goals: Week 1:  PT Short Term Goal 1 (Week 1): Pt will complete bed mobility with minA without bed features PT Short Term Goal 2 (Week 1): Pt will complete sit<>stand transfers with minA and LRAD PT Short Term Goal 3 (Week 1): Pt will perform bed<>chair transfers with minA and LRAD PT Short Term Goal 4 (Week 1): Pt will ambulate 31ft with minA and LRAD  Skilled Therapeutic Interventions/Progress Updates:     Session 1: Patient in recliner in the room upon PT arrival. Patient alert and agreeable to PT session. Patient reported 1-2/10 B knee pain during session. PT provided repositioning, rest breaks, and distraction as pain interventions throughout session.   Therapeutic Activity: Transfers: Patient performed sit to/from stand x3 with CGA using rollator. Provided verbal cues for locking rollator breaks before sitting>before standing, and controlled descent for safety.   Gait Training:  Patient ambulated 200 feet, performing 50 foot laps around a cone x2, and 242 feet from the main gym to his room using a rollator with CGA. Ambulated with narrow BOS, decreased gait speed, step height, and step length, increased forward trunk flexion, downward head gaze, and increased knee and trunk flexion with fatigue. Provided verbal cues for erect posture, increased heel strike at initial contact to encourage improved B foot clearance and step length, and increased BOS. Patient wearing non-skid socks for gait training. 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 Reviewed food care, hygiene, and safety, as well as risks and signs and symptoms of infection. Provided patient with hand-held mirror to perform daily self inspection of  his feet. Patient appreciative of education.   Neuromuscular Re-ed: Patient performed the following functional balance and lower extremity motor control activities: -sit to/form stand from low mat table 2x5 without AD and decreasing upper extremity use with close supervision for safety for improved balance and control with functional mobility  Discussed home set up during rest breaks throughout session due to poor activity tolerance. Patient reports he uses a lift chair and wing back chair at home, sleeps in a regular bed, and has grab bars throughout his bathroom. Patient used the rollator to carry things around the house, but prefers not to use the seat as a place to rest due to awkwardness of transfers.   Patient in recliner in the room at end of session with breaks locked, seat belt alarm set, and all needs within reach.   Session 2: Patient in the recliner with NT taking vitals upon PT arrival. Patient alert and agreeable to PT session. Patient denied pain during session. Vitals WNL, see flowsheet.  Patient reported that he was incontinent of bladder at start of session and requesting to be cleaned up.  Therapeutic Activity: Bed Mobility: Patient performed supine to/from sit x2 with supervision-mod I in a flat bed without use of bed rails. Provided verbal cues for performing supine to side-lying to sitting to reduce increased effort and abdominal straining to sit up. Transfers: Patient performed sit to/from stand from the recliner, BSC over the toilet, bed and w/c with close supervision. Provided verbal cues for locking breaks before sitting x2, noted improved control with descent this session. Patient was incontinent and continent of bladder during toileting. Brief was saturated. Discussed barriers to  calling for assist to go to the bathroom. Patient reports that his urgency does not allow him to make it to the bathroom in time when waiting on the nursing staff to go to the bathroom, so he  has stopped calling them. He also reports that he cannot hold the urinal independently due to his decreased hand strength and dexterity from RA. Will discuss with nursing and lead OT. Patient required total A for peri-care and lower body dressing during toileting. PT unaware that patient's pants were also saturated when the patient returned to sitting on the bed. Changed patient's brief, pants, and performed peri-care as above. Removed patient's bed sheets and disinfected patients bed and recliner as he performed there-ex, see below, NT made aware.   Therapeutic Exercise: Patient performed the following exercises with verbal and tactile cues for proper technique. -seated marching x20 -seated LAQ 2x10 -seated heel/toe raises x20  Patient in recliner in the room at end of session with breaks locked, seat belt alarm set, and all needs within reach.   Therapy Documentation Precautions:  Precautions Precautions: Fall Restrictions Weight Bearing Restrictions: No   Therapy/Group: Individual Therapy  Naitik Hermann L Cedar Roseman PT, DPT  07/01/2020, 5:11 PM

## 2020-07-01 NOTE — Progress Notes (Signed)
Occupational Therapy Session Note  Patient Details  Name: Brett Williamson MRN: [id number] Date of Birth: Nov 18, 1940  Today's Date: 07/01/2020 OT Individual Time: 9629-5284 OT Individual Time Calculation (min): 70 min    Short Term Goals: Week 1:  OT Short Term Goal 1 (Week 1): Pt will tolerate standing for 3 minutes wiithin BADL task OT Short Term Goal 2 (Week 1): Pt will be given built up handles to increase independence with self-feeding and grooming tasks OT Short Term Goal 3 (Week 1): Pt will complete toilet transfer with min A and LRAD  Skilled Therapeutic Interventions/Progress Updates:    Pt received sitting in recliner with no c/o pain. Pt agreeable to OT session. Pt declined shower and was agreeable to sink level ADLs.m Pt stood from recliner with supervision, using rollator. Ambulatory transfer in room with supervision. Very safe stand > sit with good sequencing of locking rollator, reaching back to chair and slow descent. Pt completed UB bathing with set up assist. Shirt donned supervision. Pt declined washing LB 2/2 nursing completing this. He returned to the recliner to don pants, supervision overall. Pt completed 200 ft of functional mobility with the rollator to the therapy gym. Pt SOB but all VSS following mobility. Extensive discussion re hand use/adaptations at home, engagement in leisure activities, modifications for sex/positioning, and safety in the home/bathroom transfers. Pt completed 2x 20 reciprocal stepping activity for standing balance, functional endurance training, and improved safety in the home. Discussed use of BSC vs urinal for increased safety during night time bathroom use. Pt completed BUE strengthening circuit with a 3lb dowel, with several adaptations provided for rotator cuff limitations. Extensive problem solving re pt dexterity limitations and managing packets/food containers. Pt elected to take w/c back to room 2/2 fatigue. Pt transferred back to the recliner and  was left with all needs met, chair alarm set.   Therapy Documentation Precautions:  Precautions Precautions: Fall Restrictions Weight Bearing Restrictions: No  Therapy/Group: Individual Therapy  Curtis Sites 07/01/2020, 6:39 AM

## 2020-07-02 ENCOUNTER — Inpatient Hospital Stay (HOSPITAL_COMMUNITY): Payer: Self-pay | Admitting: Occupational Therapy

## 2020-07-02 ENCOUNTER — Inpatient Hospital Stay (HOSPITAL_COMMUNITY): Payer: Self-pay

## 2020-07-02 DIAGNOSIS — I10 Essential (primary) hypertension: Secondary | ICD-10-CM

## 2020-07-02 LAB — GLUCOSE, CAPILLARY
Glucose-Capillary: 130 mg/dL — ABNORMAL HIGH (ref 70–99)
Glucose-Capillary: 147 mg/dL — ABNORMAL HIGH (ref 70–99)
Glucose-Capillary: 170 mg/dL — ABNORMAL HIGH (ref 70–99)
Glucose-Capillary: 139 mg/dL — ABNORMAL HIGH (ref 70–99)

## 2020-07-02 NOTE — Progress Notes (Signed)
Occupational Therapy Session Note  Patient Details  Name: Brett Williamson MRN: [id number] Date of Birth: 04/04/1941  Today's Date: 07/02/2020 OT Individual Time: 6295-2841 OT Individual Time Calculation (min): 72 min    Short Term Goals: Week 1:  OT Short Term Goal 1 (Week 1): Pt will tolerate standing for 3 minutes wiithin BADL task OT Short Term Goal 2 (Week 1): Pt will be given built up handles to increase independence with self-feeding and grooming tasks OT Short Term Goal 3 (Week 1): Pt will complete toilet transfer with min A and LRAD  Skilled Therapeutic Interventions/Progress Updates:    Pt received in recliner and stated he was already clean up and dressed for the day to prepare for his earlier PT session.   (arranged with scheduling to have OT session 1st tomorrow to enable pt to shower with OT)  Pt discussed his goals of S and that he will have help at home from his GF and family.  Pt is feeling ready to go home soon. Discussed the need with pt to keep working on his strength and balance. Pt stated his knees were hurting from earlier session as he already did a lot of walking.  Transported pt via wc to day room. Used kinetron at low resistance level for 8-10 min but he felt his knees were still feeling too much pressure.  Transferred to wc to work on UE strengthening with dowel bar.  Pt taken back to room and worked on standing balance with lateral weight shifting, standing and keeping his gaze fixed as he turned his head side to side and up/down.   Did a few trials of standing with eyes closed. With UE supported, he can stand for up to 20 sec with eyes closed, without support he can only hold 3-4 seconds.  Discussed with pt contacting SW about getting him a tub bench and finding out cost.  Pt resting in recliner with all needs met. Belt alarm on.   Therapy Documentation Precautions:  Precautions Precautions: Fall Restrictions Weight Bearing Restrictions: No Vital  Signs:   Pain:   ADL: ADL Eating: Minimal assistance Grooming: Setup Upper Body Bathing: Supervision/safety Where Assessed-Upper Body Bathing: Sitting at sink Lower Body Bathing: Moderate assistance Where Assessed-Lower Body Bathing: Standing at sink, Sitting at sink Upper Body Dressing: Minimal assistance Where Assessed-Upper Body Dressing: Sitting at sink Lower Body Dressing: Moderate assistance Where Assessed-Lower Body Dressing: Sitting at sink Toileting: Moderate assistance Where Assessed-Toileting: Armed forces operational officer: Raised toilet seat, Grab bars   Therapy/Group: Individual Therapy  [place] 07/02/2020, 8:31 AM

## 2020-07-02 NOTE — Progress Notes (Signed)
Occupational Therapy Session Note  Patient Details  Name: Brett Williamson MRN: [id number] Date of Birth: 1941/02/24  Today's Date: 07/02/2020 OT Individual Time: 1450-1536 OT Individual Time Calculation (min): 46 min    Short Term Goals: Week 1:  OT Short Term Goal 1 (Week 1): Pt will tolerate standing for 3 minutes wiithin BADL task OT Short Term Goal 2 (Week 1): Pt will be given built up handles to increase independence with self-feeding and grooming tasks OT Short Term Goal 3 (Week 1): Pt will complete toilet transfer with min A and LRAD  Skilled Therapeutic Interventions/Progress Updates:    Patient seated in recliner, daughter present for session.  He notes that pain is under control at this time but knees bother him with walking due to arthritis.  Sit to stand and short distance ambulation with rollator with CS to/from bed, recliner.  Tolerated unsupported sitting trunk and balance activities.  Reviewed bathroom set up and DME for home environment.  Discussed strategies for timed voiding to reduce rushing to toilet and fall risks.  Completed hamstring stretches, ambulation on unit with rollator.  Patient presents with good awareness and recall.  He returned to recliner at close of session, seat belt alarm set and call bell in reach.    Therapy Documentation Precautions:  Precautions Precautions: Fall Restrictions Weight Bearing Restrictions: No   Therapy/Group: Individual Therapy  Carlos Levering 07/02/2020, 7:44 AM

## 2020-07-02 NOTE — Progress Notes (Signed)
[facility] PHYSICAL MEDICINE & REHABILITATION PROGRESS NOTE   Subjective/Complaints:  Didn't sleep all that well, but otherwise not doing too badly. Waiting for therapy to start this morning.   ROS: Patient denies fever, rash, sore throat, blurred vision, nausea, vomiting, diarrhea, cough, shortness of breath or chest pain, joint or back pain, headache, or mood change.    Objective:   No results found. No results for input(s): WBC, HGB, HCT, PLT in the last 72 hours. No results for input(s): NA, K, CL, CO2, GLUCOSE, BUN, CREATININE, CALCIUM in the last 72 hours.  Intake/Output Summary (Last 24 hours) at 07/02/2020 1022 Last data filed at 07/02/2020 0105 Gross per 24 hour  Intake 849 ml  Output --  Net 849 ml     Pressure Injury 06/25/20 Sacrum Mid Stage 1 -  Intact skin with non-blanchable redness of a localized area usually over a bony prominence. area to mid-sacrum (Active)  06/25/20 1530  Location: Sacrum  Location Orientation: Mid  Staging: Stage 1 -  Intact skin with non-blanchable redness of a localized area usually over a bony prominence.  Wound Description (Comments): area to mid-sacrum  Present on Admission: Yes     Pressure Injury 06/25/20 Heel Right;Left;Posterior Stage 1 -  Intact skin with non-blanchable redness of a localized area usually over a bony prominence. Right and left heel extremely slow to blanch. Will place foam dressing to both. (Active)  06/25/20 1515  Location: Heel  Location Orientation: Right;Left;Posterior  Staging: Stage 1 -  Intact skin with non-blanchable redness of a localized area usually over a bony prominence.  Wound Description (Comments): Right and left heel extremely slow to blanch. Will place foam dressing to both.  Present on Admission: Yes    Physical Exam: Vital Signs Blood pressure 121/71, pulse 63, temperature 97.8 F (36.6 C), temperature source Oral, resp. rate 17, height 5\' 11"  (1.803 m), weight 117.4 kg, SpO2 98  %.   Constitutional: No distress . Vital signs reviewed. HEENT: EOMI, oral membranes moist, scalp incision CDI with staples Neck: supple Cardiovascular: RRR without murmur. No JVD    Respiratory/Chest: CTA Bilaterally without wheezes or rales. Normal effort    GI/Abdomen: BS +, non-tender, non-distended Ext: no clubbing, cyanosis, or edema Psych: pleasant and cooperative Musculoskeletal:  Cervical back: Normal range of motion. Norigidity.  Comments: 4-/5 in UEs- and equal- biceps, triceps, WE and grip 4-/5 in LEs- HF, KE, and PF- DF 2/5 B/L- and has decreased ROM of DF B/L as well Also has large scar on R 2nd digit- swollen hands, stiff, "chronic". Skin: Comments: Venous stasis changes on legs with peeling red skin and many skin tears Also has MASD in pannus, groin, between thighs Sl redness on heels.  Neurological:  Comments: Patient is alert no acute distress. Oriented to person place and time. Follows simple commands. Moves all 4's.  Decreased sensation to light touch in stocking/glove distribution- baseline   Assessment/Plan: 1. Functional deficits which require 3+ hours per day of interdisciplinary therapy in a comprehensive inpatient rehab setting.  Physiatrist is providing close team supervision and 24 hour management of active medical problems listed below.  Physiatrist and rehab team continue to assess barriers to discharge/monitor patient progress toward functional and medical goals  Care Tool:  Bathing    Body parts bathed by patient: Right arm, Left arm, Chest, Abdomen, Face, Left upper leg, Right upper leg, Right lower leg, Left lower leg   Body parts bathed by helper: Front perineal area, Buttocks  Bathing assist Assist Level: Minimal Assistance - Patient > 75%     Upper Body Dressing/Undressing Upper body dressing   What is the patient wearing?: Pull over shirt    Upper body assist Assist Level: Minimal Assistance - Patient > 75%     Lower Body Dressing/Undressing Lower body dressing      What is the patient wearing?: Pants     Lower body assist Assist for lower body dressing: Total Assistance - Patient < 25%     Toileting Toileting    Toileting assist Assist for toileting: Moderate Assistance - Patient 50 - 74%     Transfers Chair/bed transfer  Transfers assist     Chair/bed transfer assist level: Contact Guard/Touching assist     Locomotion Ambulation   Ambulation assist      Assist level: Contact Guard/Touching assist Assistive device: Rollator Max distance: 60'   Walk 10 feet activity   Assist     Assist level: Contact Guard/Touching assist Assistive device: Rollator   Walk 50 feet activity   Assist    Assist level: Contact Guard/Touching assist Assistive device: Rollator    Walk 150 feet activity   Assist Walk 150 feet activity did not occur: Safety/medical concerns (fatigue)         Walk 10 feet on uneven surface  activity   Assist Walk 10 feet on uneven surfaces activity did not occur: Safety/medical concerns         Wheelchair     Assist Will patient use wheelchair at discharge?: No Type of Wheelchair: Manual    Wheelchair assist level: Supervision/Verbal cueing Max wheelchair distance: 30'    Wheelchair 50 feet with 2 turns activity    Assist            Wheelchair 150 feet activity     Assist          Blood pressure 121/71, pulse 63, temperature 97.8 F (36.6 C), temperature source Oral, resp. rate 17, height 5\' 11"  (1.803 m), weight 117.4 kg, SpO2 98 %.  Medical Problem List and Plan: 1.  Decreased functional mobility with generalized weakness as well as reported multiple falls secondary to traumatic right side subdural hematoma.  Status post craniotomy hematoma evacuation 06/19/2020             -patient may not shower             -ELOS/Goals: S 2-3 weeks  -Continue CIR PT, OT, SLP.  2.   Antithrombotics: -DVT/anticoagulation: SCDs.             -antiplatelet therapy: N/A 3. Pain Management: Hydrocodone as needed. Pain is well controlled.  4. Mood: Provide emotional support             -antipsychotic agents: N/A 5. Neuropsych: This patient is capable of making decisions on his own behalf. Appreciate neuropsych eval.  6. Skin/Wound Care:   -Gerhardt Butt cream to buttocks scrotum perineal area after cleansing and drying  -Stasis dermatitis Bilateral pretibial- pt declines interventions 7. Fluids/Electrolytes/Nutrition: Routine in and outs with follow-up chemistries  -eating 100% 8.  Seizure prophylaxis.  Continue Keppra 500 mg twice daily 9.  Gram-negative rod UTI.  Rocephin started 06/20/2020 and stopped 11/17 after completed course.  Afebrile 11/22 10.  History of hypertension.  Patient on Lotensin 20 mg daily and HCTZ 25 mg daily prior to admission.  (Remain on hold) Vitals:   07/01/20 1914 07/02/20 0417  BP: (!) 119/48 121/71  Pulse: (!) 54 63  Resp: 18 17  Temp: 98.1 F (36.7 C) 97.8 F (36.6 C)  SpO2: 100% 98%   -11/22 controlled---no changes 11.  Diet-controlled diabetes mellitus.  Hemoglobin A1c 6.6.  SSI. RD education requested. High of 195 on 11/18.  12. Constipation: having daily BM.    LOS: 7 days A FACE TO FACE EVALUATION WAS PERFORMED  Meredith Staggers 07/02/2020, 10:22 AM

## 2020-07-02 NOTE — Progress Notes (Signed)
Patient ID: Brett Williamson, male   DOB: 07/29/1941, 79 y.o.   MRN: [id number]  SW left message for pt dtr Brett Williamson [phone]) to introduce self,  discuss discharge process, and support pt will have at discharge. SW waiting on follow-up.   SW left message for pt granddaughter Brett Williamson [phone]) to introduce self, discuss support pt will have at discharge, and challenges with getting in contact with Brett Williamson and requested assistance if possible. SW waiting on follow-up.   Loralee Pacas, MSW, [place] Office: [phone] Cell: [phone] Fax: [phone]

## 2020-07-02 NOTE — Progress Notes (Addendum)
Physical Therapy Session Note  Patient Details  Name: Brett Williamson MRN: [id number] Date of Birth: 09-26-1940  Today's Date: 07/02/2020 PT Individual Time: 5035-4656 PT Individual Time Calculation (min): 50 min  Missed 10 min due to knee pain/fatigue  Short Term Goals: Week 1:  PT Short Term Goal 1 (Week 1): Pt will complete bed mobility with minA without bed features PT Short Term Goal 2 (Week 1): Pt will complete sit<>stand transfers with minA and LRAD PT Short Term Goal 3 (Week 1): Pt will perform bed<>chair transfers with minA and LRAD PT Short Term Goal 4 (Week 1): Pt will ambulate 59ft with minA and LRAD  Skilled Therapeutic Interventions/Progress Updates:    Patient in recliner without complaints.  Donned pants in sitting min A for assisting to get L leg looped, CGA for sit to stand for pulling up pants.  Patient ambulated x 160' to therapy gym with w/c following.  Reported R knee felt like could give away, but no LOB.  Patient in parallel bars tapping alternate feet to 2" step over colored discs with bilateral UE support.  Attempted with 1 UE support, but noted instability and pt not comfortable with this challenge.  Standing at hi-lo table for completing pipe tree construction with CGA to S for balance.  Patient in w/c assisted to room.  Sit to stand with S and ambulated to bed x 10' with rollator and S.  Sit to supine S.  Performed bed exercise as noted below. Supine to sit with CGA.  Sit to stand to rollator and ambulated to recliner with S.  Left seated with chair alarm activated and needs within reach.  Therapy Documentation Precautions:  Precautions Precautions: Fall Restrictions Weight Bearing Restrictions: No Pain: Pain Assessment Pain Scale: 0-10 Pain Score: 5  (Simultaneous filing. User may not have seen previous data.) Pain Type: Acute pain Pain Location: Knee Pain Orientation: Right;Left Pain Descriptors / Indicators: Aching Pain Onset: With Activity Pain  Intervention(s): Rest Exercises: General Exercises - Lower Extremity Quad Sets: Strengthening;Both;5 reps;Supine (x 2 sets with adductor squeeze) Gluteal Sets: Strengthening;5 reps;Supine (x 2 sets) Short Arc Quad: Strengthening;5 reps;Supine (w/ 2.5 # x 2 sets) Heel Slides: AROM;Both;5 reps;Supine Hip ABduction/ADduction: 10 reps;Sidelying;Strengthening;Both (clamshell) Straight Leg Raises: Strengthening;Both;5 reps;Supine (2.5# x 2 sets) Other Exercises Other Exercises: seated heel raises x 10    Therapy/Group: Individual Therapy  Reginia Naas  Magda Kiel, PT 07/02/2020, 12:32 PM

## 2020-07-02 NOTE — Discharge Summary (Signed)
Physician Discharge Summary  Patient ID: Brett Williamson MRN: [id number] DOB/AGE: 04-12-1941 79 y.o.  Admit date: 06/19/2020 Discharge date: 07/02/2020  Admission Diagnoses:Subdural hematoma; hemiparesis  Discharge Diagnoses:  Active Problems:   Subdural hematoma (HCC)   Generalized weakness   Hyponatremia   Essential hypertension   Subdural bleeding (HCC)   Pressure injury of skin   Discharged Condition: fair  Hospital Course: Patient was admitted through the ER with a large right frontal subdural hematoma with falling and left hemiparesis.  He was taken to the OR and underwent uncomplicated right craniotomy for subdural hematoma.  The patient did well with surgery and made steady improvements in his mobility and weakness.  He had a drain in the subdural space, which was discontinued.  He was evaluated by the Rehabilitation Service and was transferred to inpatient Rehab.  Consults: rehabilitation medicine  Significant Diagnostic Studies: Head CT  Treatments: surgery: patient underwent uncomplicated right craniotomy for subdural hematoma  Discharge Exam: Blood pressure (!) 112/52, pulse (!) 58, temperature 97.8 F (36.6 C), resp. rate 20, height 5\' 11"  (1.803 m), weight 120.7 kg, SpO2 100 %. Neurologic: Alert and oriented X 3, normal strength and tone. Normal symmetric reflexes. Normal coordination and gait Wound:  Disposition: Rehab   Allergies as of 06/25/2020   No Known Allergies     Medication List    ASK your doctor about these medications   benazepril 20 MG tablet Commonly known as: LOTENSIN Take 20 mg by mouth daily.   hydrochlorothiazide 25 MG tablet Commonly known as: HYDRODIURIL Take 25 mg by mouth daily.   ibuprofen 200 MG tablet Commonly known as: ADVIL Take 200 mg by mouth every 4 (four) hours as needed for mild pain.        Signed: Peggyann Shoals, MD 07/02/2020, 9:06 AM

## 2020-07-02 NOTE — Progress Notes (Signed)
Staples removed with no drainage. Incision still approximated. Educated patient on proper incisional care.

## 2020-07-03 ENCOUNTER — Inpatient Hospital Stay (HOSPITAL_COMMUNITY): Payer: Self-pay | Admitting: Occupational Therapy

## 2020-07-03 ENCOUNTER — Inpatient Hospital Stay (HOSPITAL_COMMUNITY): Payer: Self-pay

## 2020-07-03 LAB — GLUCOSE, CAPILLARY
Glucose-Capillary: 124 mg/dL — ABNORMAL HIGH (ref 70–99)
Glucose-Capillary: 138 mg/dL — ABNORMAL HIGH (ref 70–99)
Glucose-Capillary: 179 mg/dL — ABNORMAL HIGH (ref 70–99)
Glucose-Capillary: 170 mg/dL — ABNORMAL HIGH (ref 70–99)

## 2020-07-03 NOTE — Progress Notes (Signed)
1Occupational Therapy Weekly Progress Note  Patient Details  Name: Brett Williamson MRN: [id number] Date of Birth: May 31, 1941  Beginning of progress report period: July 06, 2020 End of progress report period: July 03, 2020  Today's Date: 07/03/2020 OT Individual Time: 0938-1829 OT Individual Time Calculation (min): 72 min   Patient has met 3 of 3 short term goals.  Pt is making steady progress towards OT goals. Pt is at an overall min/CGA level for functional BADL tasks. He is making great progress towards his supervision level goals.   Patient continues to demonstrate the following deficits: muscle weakness, decreased cardiorespiratoy endurance and decreased standing balance, decreased postural control and decreased balance strategies and therefore will continue to benefit from skilled OT intervention to enhance overall performance with BADL and Reduce care partner burden.  Patient progressing toward long term goals..  Continue plan of care.  OT Short Term Goals Week 1:  OT Short Term Goal 1 (Week 1): Pt will tolerate standing for 3 minutes wiithin BADL task OT Short Term Goal 1 - Progress (Week 1): Met OT Short Term Goal 2 (Week 1): Pt will be given built up handles to increase independence with self-feeding and grooming tasks OT Short Term Goal 2 - Progress (Week 1): Met OT Short Term Goal 3 (Week 1): Pt will complete toilet transfer with min A and LRAD OT Short Term Goal 3 - Progress (Week 1): Met Week 2:  OT Short Term Goal 1 (Week 2): LTG=STG 2/2 ELOS  Skilled Therapeutic Interventions/Progress Updates:    Pt greeted sitting in recliner and agreeable to OT treatment session. Pt declined to go to the bathroom when offered. Pt completed sit<>stand with rollator and supervision. He then needed CGA to ambulate short distance in the room to wc. UB there-ex with 3 sets of 10 bicep curls, chest press, and wrist extension. Seated beach ball volley using 3 lb dowel rod. Pt completed 4  minutes on UE Ergometer, but experienced some shoulder discomfort and requested to stop. Worked on dynamic balance and standing endurance with cornhole toss activity. Pt needed supervision/CGA for dynamic balance. Incorporated reaching outside base of support to obtain bean bag. Issued soft tan theraputty and completed hand exercises to help maintain finger ROM and hand dexterity. Pt returned to room and left seated in recliner with alarm belt on, call bell in reach, and needs met.   Therapy Documentation Precautions:  Precautions Precautions: Fall Restrictions Weight Bearing Restrictions: No Pain: Pain Assessment Pain Score: 0-No pain   Therapy/Group: Individual Therapy  Valma Cava 07/03/2020, 2:43 PM

## 2020-07-03 NOTE — Progress Notes (Signed)
Occupational Therapy Session Note  Patient Details  Name: Brett Williamson MRN: [id number] Date of Birth: 1941-08-02  Today's Date: 07/03/2020 OT Individual Time: 0973-5329 OT Individual Time Calculation (min): 60 min    Short Term Goals: Week 1:  OT Short Term Goal 1 (Week 1): Pt will tolerate standing for 3 minutes wiithin BADL task OT Short Term Goal 2 (Week 1): Pt will be given built up handles to increase independence with self-feeding and grooming tasks OT Short Term Goal 3 (Week 1): Pt will complete toilet transfer with min A and LRAD  Skilled Therapeutic Interventions/Progress Updates:    pt received in recliner and agreeable to a shower.  He used his rollator to ambulate to bathroom with rollator. He continues to need A with cleansing post toileting as he does not have the exact toilet aid he uses at home and his arthritic hands have difficulty with the tongs.  He stepped into shower and bathed with S, using the cloth for his bottom.  Ambulated back to recliner to rest and then dress. He needs to use a wide sock aide to don socks, but can otherwise complete dressing.  He continues to use incontinence briefs due to urinary urgency.  Pt then worked on UE exercises using green theraband for arm extensions and 3# dowel bar for chest presses. Pt has limited shoulder and hand ROM due to arthritis.  Pt resting in recliner with belt alarm on and all needs met.  Therapy Documentation Precautions:  Precautions Precautions: Fall Restrictions Weight Bearing Restrictions: No      Pain: Pain Assessment Pain Score: 0-No pain ADL: ADL Eating: Set up Grooming: Setup Upper Body Bathing: Supervision/safety Where Assessed-Upper Body Bathing: Shower Lower Body Bathing: Supervision/safety Where Assessed-Lower Body Bathing: Shower Upper Body Dressing: Setup Where Assessed-Upper Body Dressing: Chair Lower Body Dressing: Supervision/safety Where Assessed-Lower Body Dressing:  Chair Toileting: Minimal assistance Where Assessed-Toileting: Glass blower/designer: Close supervision Armed forces technical officer Method: Counselling psychologist: Raised toilet seat, Grab bars Tub/Shower Transfer: Close supervison Clinical cytogeneticist Method: Optometrist: Facilities manager: Close supervision Social research officer, government Method: Heritage manager: Radio broadcast assistant, Grab bars   Therapy/Group: Individual Therapy  [place] 07/03/2020, 11:50 AM

## 2020-07-03 NOTE — Progress Notes (Signed)
Patient ID: Brett Williamson, male   DOB: July 14, 1941, 79 y.o.   MRN: [id number]  SW returned phone call to pt dtr [name] [phone]) to provide updates from team conference, discuss discharge plan, pt d/c date 11/27, and family education. SW informed recommendation is for him to have 24/7 care and inquired about how or if family is able to assist. Reports that between she and her dtr Roanna Epley they will take shifts providing care to him. Fam edu scheduled for Fri 11/26 1pm-2:30pm with pt dtr and granddtr. SW informed on recommended DME and she reports pt does not have these items.    *Sw met with pt and pt friend in room to provide above updates. Pt states he will not use a 3in1 BSC so SW should not order. Pt amenable to TTB. Pt has no HHA preference. SW informed will follow-up once there is more information.   SW sent HHPT/OT/aide referral to [facility]. SW waiting on follow-up. SW ordered DME: TTB. Pt will have to private pay for TTB due to no Medicare Part B. Cost witll be $60 for pt. SW to follow-up with pt to give more insight. SW requested [employer] [facility] follow-up with pt by calling him in his room.   Loralee Pacas, MSW, [place] Office: [phone] Cell: [phone] Fax: [phone]

## 2020-07-03 NOTE — Plan of Care (Signed)
  Problem: Consults Goal: RH BRAIN INJURY PATIENT EDUCATION Description: Description: See Patient Education module for eduction specifics Outcome: Progressing

## 2020-07-03 NOTE — Progress Notes (Signed)
[facility] PHYSICAL MEDICINE & REHABILITATION PROGRESS NOTE   Subjective/Complaints:  Had a good night sleep. No complaints. Anxious to get home!  ROS: Patient denies fever, rash, sore throat, blurred vision, nausea, vomiting, diarrhea, cough, shortness of breath or chest pain, joint or back pain, headache, or mood change.     Objective:   No results found. No results for input(s): WBC, HGB, HCT, PLT in the last 72 hours. No results for input(s): NA, K, CL, CO2, GLUCOSE, BUN, CREATININE, CALCIUM in the last 72 hours.  Intake/Output Summary (Last 24 hours) at 07/03/2020 1102 Last data filed at 07/03/2020 0700 Gross per 24 hour  Intake 840 ml  Output 50 ml  Net 790 ml     Pressure Injury 06/25/20 Sacrum Mid Stage 1 -  Intact skin with non-blanchable redness of a localized area usually over a bony prominence. area to mid-sacrum (Active)  06/25/20 1530  Location: Sacrum  Location Orientation: Mid  Staging: Stage 1 -  Intact skin with non-blanchable redness of a localized area usually over a bony prominence.  Wound Description (Comments): area to mid-sacrum  Present on Admission: Yes     Pressure Injury 06/25/20 Heel Right;Left;Posterior Stage 1 -  Intact skin with non-blanchable redness of a localized area usually over a bony prominence. Right and left heel extremely slow to blanch. Will place foam dressing to both. (Active)  06/25/20 1515  Location: Heel  Location Orientation: Right;Left;Posterior  Staging: Stage 1 -  Intact skin with non-blanchable redness of a localized area usually over a bony prominence.  Wound Description (Comments): Right and left heel extremely slow to blanch. Will place foam dressing to both.  Present on Admission: Yes    Physical Exam: Vital Signs Blood pressure 101/65, pulse 63, temperature 98 F (36.7 C), temperature source Oral, resp. rate 18, height 5\' 11"  (1.803 m), weight 116.1 kg, SpO2 96 %.   Constitutional: No distress . Vital signs  reviewed. HEENT: EOMI, oral membranes moist, scalp incision CDI with staples Neck: supple Cardiovascular: RRR without murmur. No JVD    Respiratory/Chest: CTA Bilaterally without wheezes or rales. Normal effort    GI/Abdomen: BS +, non-tender, non-distended Ext: no clubbing, cyanosis, or edema Psych: pleasant and cooperative Musculoskeletal:   Also has large scar on R 2nd digit- swollen hands, stiff, "chronic". Skin: LE venous stasis changes.     MASD in pannus, groin, between thighs Sl redness on heels, sacrum Neurological:  Comments: Alert and oriented x 3. Normal insight and awareness. Intact Memory. Normal language and speech. Cranial nerve exam unremarkable Moves all 4's.  Decreased sensation to light touch in stocking/glove distribution- baseline Motor 4/5 UE prox--> grip 4-/5. LE: 4-/5 HF, KE and 2/5 ADF/PF.   Assessment/Plan: 1. Functional deficits which require 3+ hours per day of interdisciplinary therapy in a comprehensive inpatient rehab setting.  Physiatrist is providing close team supervision and 24 hour management of active medical problems listed below.  Physiatrist and rehab team continue to assess barriers to discharge/monitor patient progress toward functional and medical goals  Care Tool:  Bathing    Body parts bathed by patient: Right arm, Left arm, Chest, Abdomen, Face, Left upper leg, Right upper leg, Right lower leg, Left lower leg   Body parts bathed by helper: Front perineal area, Buttocks     Bathing assist Assist Level: Minimal Assistance - Patient > 75%     Upper Body Dressing/Undressing Upper body dressing   What is the patient wearing?: Pull over shirt  Upper body assist Assist Level: Minimal Assistance - Patient > 75%    Lower Body Dressing/Undressing Lower body dressing      What is the patient wearing?: Pants     Lower body assist Assist for lower body dressing: Total Assistance - Patient < 25%      Toileting Toileting    Toileting assist Assist for toileting: Moderate Assistance - Patient 50 - 74%     Transfers Chair/bed transfer  Transfers assist     Chair/bed transfer assist level: Contact Guard/Touching assist     Locomotion Ambulation   Ambulation assist      Assist level: Contact Guard/Touching assist Assistive device: Rollator Max distance: 60'   Walk 10 feet activity   Assist     Assist level: Contact Guard/Touching assist Assistive device: Rollator   Walk 50 feet activity   Assist    Assist level: Contact Guard/Touching assist Assistive device: Rollator    Walk 150 feet activity   Assist Walk 150 feet activity did not occur: Safety/medical concerns (fatigue)         Walk 10 feet on uneven surface  activity   Assist Walk 10 feet on uneven surfaces activity did not occur: Safety/medical concerns         Wheelchair     Assist Will patient use wheelchair at discharge?: No Type of Wheelchair: Manual    Wheelchair assist level: Supervision/Verbal cueing Max wheelchair distance: 30'    Wheelchair 50 feet with 2 turns activity    Assist            Wheelchair 150 feet activity     Assist          Blood pressure 101/65, pulse 63, temperature 98 F (36.7 C), temperature source Oral, resp. rate 18, height 5\' 11"  (1.803 m), weight 116.1 kg, SpO2 96 %.  Medical Problem List and Plan: 1.  Decreased functional mobility with generalized weakness as well as reported multiple falls secondary to traumatic right side subdural hematoma.  Status post craniotomy hematoma evacuation 06/19/2020             -patient may not shower             -ELOS/Goals: S 2-3 weeks  -Continue CIR PT, OT, SLP.  2.  Antithrombotics: -DVT/anticoagulation: SCDs.             -antiplatelet therapy: N/A 3. Pain Management: Hydrocodone as needed. Pain is well controlled.  4. Mood: Provide emotional support             -antipsychotic  agents: N/A 5. Neuropsych: This patient is capable of making decisions on his own behalf. Appreciate neuropsych eval.  6. Skin/Wound Care:   -Gerhardt Butt cream to buttocks scrotum perineal area after cleansing and drying  -Stasis dermatitis Bilateral pretibial- pt declines interventions 7. Fluids/Electrolytes/Nutrition: Routine in and outs with follow-up chemistries  -eating 100% 8.  Seizure prophylaxis.  Continue Keppra 500 mg twice daily 9.  Gram-negative rod UTI.  Rocephin started 06/20/2020 and stopped 11/17 after completed course.   -resolved 10.  History of hypertension.  Patient on Lotensin 20 mg daily and HCTZ 25 mg daily prior to admission.  (Remain on hold) Vitals:   07/02/20 1916 07/03/20 0504  BP: 118/74 101/65  Pulse: (!) 55 63  Resp: 18 18  Temp: 97.9 F (36.6 C) 98 F (36.7 C)  SpO2: 99% 96%   -11/23 controlled---no changes 11.  Diet-controlled diabetes mellitus.  Hemoglobin A1c 6.6.  SSI. RD  education requested. High of 195 on 11/18.  12. Constipation: having daily BM.    LOS: 8 days A FACE TO FACE EVALUATION WAS PERFORMED  Brett Williamson 07/03/2020, 11:02 AM

## 2020-07-03 NOTE — Progress Notes (Signed)
Physical Therapy Session Note  Patient Details  Name: Brett Williamson MRN: [id number] Date of Birth: 04/10/1941  Today's Date: 07/03/2020 PT Individual Time: 1100-1143 PT Individual Time Calculation (min): 43 min   Short Term Goals: Week 1:  PT Short Term Goal 1 (Week 1): Pt will complete bed mobility with minA without bed features PT Short Term Goal 2 (Week 1): Pt will complete sit<>stand transfers with minA and LRAD PT Short Term Goal 3 (Week 1): Pt will perform bed<>chair transfers with minA and LRAD PT Short Term Goal 4 (Week 1): Pt will ambulate 45ft with minA and LRAD  Skilled Therapeutic Interventions/Progress Updates:     Pt received seated in recliner and agrees to therapy. No complaint of pain. Stand step transfer to [facility] with rollator and supervision for safe AD management. WC transport to gym for time management.  Pt ambulates to fatigue with rollator, x200' with close supervision from PT, providing verbal cues and tactile cues for upright posture and gaze to improve balance and safety, and positioning for safe transition back to [facility] for rest break. Pt requires extended seated rest break prior to ambulating additional 200'.  Pt performs standing balance activity for strengthening and endurance training, holding onto bowling ball and tossing with R upper extremity, with PT providing CGA/minA at hips and shoulders for stability. Pt requires seated rest breaks every 2-3 minutes during activity, but performs multiple sit to stand transfers with supervision.  Pt left seated in recliner with alarm intact and all needs within reach.  Therapy Documentation Precautions:  Precautions Precautions: Fall Restrictions Weight Bearing Restrictions: No    Therapy/Group: Individual Therapy  [name], PT, DPT 07/03/2020, 12:06 PM

## 2020-07-03 NOTE — Patient Care Conference (Signed)
Inpatient RehabilitationTeam Conference and Plan of Care Update Date: 07/03/2020   Time: 10:49 AM   Patient Name: Brett Williamson      Medical Record Number: [id number]  Date of Birth: Nov 08, 1940 Sex: Male         Room/Bed: 4M10C/4M10C-01 Payor Info: Payor: MEDICARE / Plan: MEDICARE PART A / Product Type: *No Product type* /    Admit Date/Time:  06/25/2020  2:20 PM  Primary Diagnosis:  Traumatic subdural hematoma [facility])  Hospital Problems: Principal Problem:   Traumatic subdural hematoma ([place]) Active Problems:   Pressure injury of skin    Expected Discharge Date: Expected Discharge Date: 07/07/20  Team Members Present: Physician leading conference: Dr. Alger Simons Care Coodinator Present: Erlene Quan, BSW;Shanita Kanan Creig Hines, RN, BSN, [place] Nurse Present: Other (comment) Shayne Alken, RN) PT Present: Tereasa Coop, PT OT Present: Cherylynn Ridges, OT PPS Coordinator present : Ileana Ladd, Burna Mortimer, SLP     Current Status/Progress Goal Weekly Team Focus  Bowel/Bladder   Incontinent B/B. LBM 11/23  Improve continence  toilet q2h   Swallow/Nutrition/ Hydration             ADL's   S overall, LTG  are almost met  Supervision  ADL training, balance, pt education   Mobility   supervision bed mobility, CGA transfers and ambulation 200' with rollator  CGA  DC prep   Communication             Safety/Cognition/ Behavioral Observations            Pain   c/o headache occasionally 3-4/10 relieved with Tylenol  reach patient pain goal 0/10  antiipate and treat pain accordingly   Skin   right head staples removed. Wound scabbed and well approximated. Continue skin tears, abrasions and ecchymosis to all extremities.  wound healing  assess and treat wounds as ordered     Discharge Planning:  Pt lives alone; and will need to be Mod I at d/c as he will have intermittent support from his dtr, granddaughter, and girlfriend. No confirmatin on level of support from family  at this time.   Team Discussion: Incontinent at night, continent during the day. Multiple skin issues. OT reports patient has supervision goals and patient will have 24/7 care. PT reports patient is contact guard assist with ambulation using the rollator. Patient on target to meet rehab goals: Yes  *See Care Plan and progress notes for long and short-term goals.   Revisions to Treatment Plan:  Not at this time.  Teaching Needs: Continue with family education.  Current Barriers to Discharge: Inaccessible home environment, Decreased caregiver support, Home enviroment access/layout, Incontinence, Lack of/limited family support, Behavior and sleep/wake cycle.  Possible Resolutions to Barriers: Provide timed toileting schedule for evenings, continue current medications, provide emotional support, provide quiet time at HS to induce an adequate sleep environment.      Medical Summary Current Status: right SDH after fall. bp controlled. no headaches. sleep improving  Barriers to Discharge: Medical stability   Possible Resolutions to Barriers/Weekly Focus: daily lab/VS review. sleep-wake cycle restoration   Continued Need for Acute Rehabilitation Level of Care: The patient requires daily medical management by a physician with specialized training in physical medicine and rehabilitation for the following reasons: Direction of a multidisciplinary physical rehabilitation program to maximize functional independence : Yes Medical management of patient stability for increased activity during participation in an intensive rehabilitation regime.: Yes Analysis of laboratory values and/or radiology reports with any subsequent need for medication adjustment  and/or medical intervention. : Yes   I attest that I was present, lead the team conference, and concur with the assessment and plan of the team.   Cristi Loron 07/03/2020, 2:31 PM

## 2020-07-04 ENCOUNTER — Inpatient Hospital Stay (HOSPITAL_COMMUNITY): Payer: Self-pay | Admitting: Physical Therapy

## 2020-07-04 ENCOUNTER — Inpatient Hospital Stay (HOSPITAL_COMMUNITY): Payer: Self-pay | Admitting: Occupational Therapy

## 2020-07-04 ENCOUNTER — Inpatient Hospital Stay (HOSPITAL_COMMUNITY): Payer: Self-pay

## 2020-07-04 LAB — GLUCOSE, CAPILLARY
Glucose-Capillary: 137 mg/dL — ABNORMAL HIGH (ref 70–99)
Glucose-Capillary: 130 mg/dL — ABNORMAL HIGH (ref 70–99)
Glucose-Capillary: 143 mg/dL — ABNORMAL HIGH (ref 70–99)
Glucose-Capillary: 158 mg/dL — ABNORMAL HIGH (ref 70–99)
Glucose-Capillary: 216 mg/dL — ABNORMAL HIGH (ref 70–99)

## 2020-07-04 NOTE — Progress Notes (Signed)
Physical Therapy Weekly Progress Note  Patient Details  Name: Brett Williamson MRN: [id number] Date of Birth: November 18, 1940  Beginning of progress report period: June 26, 2020 End of progress report period: July 04, 2020  Today's Date: 07/04/2020 PT Individual Time: 8264-1583 PT Individual Time Calculation (min): 30 min  and Today's Date: 07/04/2020 PT Missed Time: 45 Minutes Missed Time Reason: Patient unwilling to participate  Patient has met 4 of 4 short term goals.  Pt is progressing well toward PT goals, improving independence in all aspects of mobility. Pt is performing bed mobility consistently at supervision levels. Sit to stand and stand pivot transfers consistently with CGA, and pt ambulates up to 200' with rollator and close supervision. Pt limited by deficits in strength and endurance and will benefit from skilled PT to address deficits and prepare pt for DC on 11/27.  Patient continues to demonstrate the following deficits muscle weakness, decreased cardiorespiratoy endurance and decreased standing balance and decreased balance strategies and therefore will continue to benefit from skilled PT intervention to increase functional independence with mobility.  Patient progressing toward long term goals..  Continue plan of care.  PT Short Term Goals Week 1:  PT Short Term Goal 1 (Week 1): Pt will complete bed mobility with minA without bed features PT Short Term Goal 1 - Progress (Week 1): Met PT Short Term Goal 2 (Week 1): Pt will complete sit<>stand transfers with minA and LRAD PT Short Term Goal 2 - Progress (Week 1): Met PT Short Term Goal 3 (Week 1): Pt will perform bed<>chair transfers with minA and LRAD PT Short Term Goal 3 - Progress (Week 1): Met PT Short Term Goal 4 (Week 1): Pt will ambulate 60f with minA and LRAD PT Short Term Goal 4 - Progress (Week 1): Met Week 2:  PT Short Term Goal 1 (Week 2): STGs = LTGs due to ELOS  Skilled Therapeutic Interventions/Progress  Updates:  Ambulation/gait training;DME/adaptive equipment instruction;Psychosocial support;UE/LE Strength taining/ROM;UE/LE Coordination activities;Skin care/wound management;Functional electrical stimulation;Balance/vestibular training;Cognitive remediation/compensation;Functional mobility training;Splinting/orthotics;Visual/perceptual remediation/compensation;Wheelchair propulsion/positioning;Stair training;Neuromuscular re-education;Community reintegration;Discharge planning;Pain management;Therapeutic Activities;Therapeutic Exercise;Patient/family education;Disease management/prevention   Pt received seated in recliner with NT present assessing vitals. When NT finished, PT asks how pt is doing and pt responds "Not too well, I've peed my pants twice while she was taking my vitals". PT asks pt why he did not inform PT or NT that he needed to use the restroom and pt replies "It wouldn't have done any good". PT educates pt on importance of using toilet rather than urinating in brief. Pt then performs stand step transfer to toilet with rollator and close supervision (x15'). PT assists pt with doffing saturated brief and pants and donning clean lower body dressing. PT cues pt to ambulate to gym and pt refuses, saying he has already "done that twice today". Pt refuses additional therapy this afternoon, requesting to rest. Pt misses 45 minutes due to refusal to participate.  Therapy Documentation Precautions:  Precautions Precautions: Fall Restrictions Weight Bearing Restrictions: No General: PT Amount of Missed Time (min): 45 Minutes PT Missed Treatment Reason: Patient unwilling to participate   Therapy/Group: Individual Therapy  WBreck Coons PT, DPT 07/04/2020, 4:09 PM

## 2020-07-04 NOTE — Progress Notes (Signed)
Physical Therapy Session Note  Patient Details  Name: Brett Williamson MRN: [id number] Date of Birth: 1941-04-01  Today's Date: 07/04/2020 PT Individual Time: 0800-0855 PT Individual Time Calculation (min): 55 min   Short Term Goals: Week 1:  PT Short Term Goal 1 (Week 1): Pt will complete bed mobility with minA without bed features PT Short Term Goal 2 (Week 1): Pt will complete sit<>stand transfers with minA and LRAD PT Short Term Goal 3 (Week 1): Pt will perform bed<>chair transfers with minA and LRAD PT Short Term Goal 4 (Week 1): Pt will ambulate 18ft with minA and LRAD  Skilled Therapeutic Interventions/Progress Updates:    Pt received seated in recliner in room, agreeable to PT session. 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 Pt declines any intervention for knee pain. Education with patient regarding OA and mobility to improve pain. Pt is Supervision for sit to stand to rollator throughout session with cues for safe brake management. Ambulation up to 150 ft with use of rollator at Supervision level. Sidesteps L/R 2 x 10 ft each direction with rollator and Supervision for balance. Remainder of session focus on functional transfers in simulation rehab apartment. Sit to stand with max A from low, pliable surface of couch. Per pt report he does not have low furniture at home and only sits in chairs with armrests so that he can use those to assist with transfer. Supine to/from sit on real bed at Supervision level. Per pt report his bed is shorter than rehab bed and also more firm. Pt then found to be incontinent of BM. Returned to pt room. Standing at sink with Supervision and BUE support while pericare and clothing change performed with max A. Pt returned to sitting in recliner at end of session, needs in reach, quick release belt and chair alarm in place at end of session.  Therapy Documentation Precautions:  Precautions Precautions: Fall Restrictions Weight  Bearing Restrictions: No   Therapy/Group: Individual Therapy   Excell Seltzer, PT, DPT  07/04/2020, 12:06 PM

## 2020-07-04 NOTE — Progress Notes (Signed)
Patient ID: Brett Williamson, male   DOB: 11-Dec-1940, 79 y.o.   MRN: [id number]   SW sent referral to [facility] for HHPT/OT/Aide. Sw waiting on follow-up.   *Updates from [facility] is that they are out of stock of TTB and will have item shipped to patient home. Also states when speaking with pt he did not have any credit card information to provide, so he has been encouraged to call when he gets home so item can be shipped.   Loralee Pacas, MSW, [place] Office: [phone] Cell: [phone] Fax: [phone]

## 2020-07-04 NOTE — Progress Notes (Signed)
Occupational Therapy Session Note  Patient Details  Name: Brett Williamson MRN: [id number] Date of Birth: 1940/12/31  Today's Date: 07/04/2020 OT Individual Time: 0930-1040 OT Individual Time Calculation (min): 70 min    Short Term Goals: Week 2:  OT Short Term Goal 1 (Week 2): LTG=STG 2/2 ELOS  Skilled Therapeutic Interventions/Progress Updates:    Pt seen this session to focus on strength and functional mobility.  Pt stated he already had clean clothes on and did not want to bathe.  Pt stated he already had PT and walked too much and now his knees were hurting.    Pt stated he did not want to work on balance as he now feels his knees are bone on bone. Pt transported to gym to transfer to a mat with his rollator.  Spent most of the session in sitting on EOB working on: -AROM exercise knee extension and sh flex/ext rolling ball forward and back -core activation with holding ball and rotating side to side and completing modified cross chops -resistive UB using theraband for triceps and low shoulder abduction -used band around thighs for hip abduction  - ball squeezes between knees for hip adduction  Pt repeated all exercises for 3 sets.  Many of the exercises chosen based on his limited hand dexterity and limited sh ROM due to prior rotator cuff injuries.  Pt decided then it would be ok to walk back to his room with rollator (about 100 feet). Only close supervision needed.   Pt sat in recliner with belt alarm on and all needs met.   Therapy Documentation Precautions:  Precautions Precautions: Fall Restrictions Weight Bearing Restrictions: No  Pain: Pain Assessment Pain Scale: 0-10 Pain Score: 0-No pain ADL: ADL Eating: Set up Grooming: Setup Upper Body Bathing: Supervision/safety Where Assessed-Upper Body Bathing: Shower Lower Body Bathing: Supervision/safety Where Assessed-Lower Body Bathing: Shower Upper Body Dressing: Setup Where Assessed-Upper Body Dressing: Chair Lower  Body Dressing: Supervision/safety Where Assessed-Lower Body Dressing: Chair Toileting: Minimal assistance Where Assessed-Toileting: Glass blower/designer: Close supervision Armed forces technical officer Method: Counselling psychologist: Raised toilet seat, Grab bars Tub/Shower Transfer: Close supervison Clinical cytogeneticist Method: Optometrist: Facilities manager: Close supervision Social research officer, government Method: Heritage manager: Radio broadcast assistant, Grab bars   Therapy/Group: Individual Therapy  [place] 07/04/2020, 8:50 AM

## 2020-07-04 NOTE — Progress Notes (Signed)
[facility] PHYSICAL MEDICINE & REHABILITATION PROGRESS NOTE   Subjective/Complaints: Slept well again. Appetite good. Pleased with Saturday dc date  ROS: Patient denies fever, rash, sore throat, blurred vision, nausea, vomiting, diarrhea, cough, shortness of breath or chest pain, joint or back pain, headache, or mood change.     Objective:   No results found. No results for input(s): WBC, HGB, HCT, PLT in the last 72 hours. No results for input(s): NA, K, CL, CO2, GLUCOSE, BUN, CREATININE, CALCIUM in the last 72 hours.  Intake/Output Summary (Last 24 hours) at 07/04/2020 0825 Last data filed at 07/04/2020 0736 Gross per 24 hour  Intake 1200 ml  Output --  Net 1200 ml     Pressure Injury 06/25/20 Sacrum Mid Stage 1 -  Intact skin with non-blanchable redness of a localized area usually over a bony prominence. area to mid-sacrum (Active)  06/25/20 1530  Location: Sacrum  Location Orientation: Mid  Staging: Stage 1 -  Intact skin with non-blanchable redness of a localized area usually over a bony prominence.  Wound Description (Comments): area to mid-sacrum  Present on Admission: Yes     Pressure Injury 06/25/20 Heel Right;Left;Posterior Stage 1 -  Intact skin with non-blanchable redness of a localized area usually over a bony prominence. Right and left heel extremely slow to blanch. Will place foam dressing to both. (Active)  06/25/20 1515  Location: Heel  Location Orientation: Right;Left;Posterior  Staging: Stage 1 -  Intact skin with non-blanchable redness of a localized area usually over a bony prominence.  Wound Description (Comments): Right and left heel extremely slow to blanch. Will place foam dressing to both.  Present on Admission: Yes    Physical Exam: Vital Signs Blood pressure (!) 100/56, pulse 74, temperature 98.4 F (36.9 C), temperature source Oral, resp. rate 20, height 5\' 11"  (1.803 m), weight 116.1 kg, SpO2 95 %.   Constitutional: No distress . Vital  signs reviewed. HEENT: EOMI, oral membranes moist, crani incision CDI Neck: supple Cardiovascular: RRR without murmur. No JVD    Respiratory/Chest: CTA Bilaterally without wheezes or rales. 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     MASD in pannus, groin, between thighs--no change Sl redness on heels, sacrum Neurological:  Comments: Alert and oriented x 3. Normal insight and awareness. Intact Memory. Normal language and speech. Cranial nerve exam unremarkable Moves all 4's.  Decreased sensation to light touch in stocking/glove distribution- baseline Motor 4/5 UE prox--> grip 4-/5. LE: 4-/5 HF, KE and  2 to 2+/5 ADF/PF.   Assessment/Plan: 1. Functional deficits which require 3+ hours per day of interdisciplinary therapy in a comprehensive inpatient rehab setting.  Physiatrist is providing close team supervision and 24 hour management of active medical problems listed below.  Physiatrist and rehab team continue to assess barriers to discharge/monitor patient progress toward functional and medical goals  Care Tool:  Bathing    Body parts bathed by patient: Right arm, Left arm, Chest, Abdomen, Face, Left upper leg, Right upper leg, Right lower leg, Left lower leg, Buttocks, Front perineal area   Body parts bathed by helper: Front perineal area, Buttocks     Bathing assist Assist Level: Supervision/Verbal cueing     Upper Body Dressing/Undressing Upper body dressing   What is the patient wearing?: Pull over shirt    Upper body assist Assist Level: Supervision/Verbal  cueing    Lower Body Dressing/Undressing Lower body dressing      What is the patient wearing?: Pants     Lower body assist Assist for lower body dressing: Supervision/Verbal cueing     Toileting Toileting     Toileting assist Assist for toileting: Minimal Assistance - Patient > 75%     Transfers Chair/bed transfer  Transfers assist     Chair/bed transfer assist level: Supervision/Verbal cueing     Locomotion Ambulation   Ambulation assist      Assist level: Supervision/Verbal cueing Assistive device: Rollator Max distance: 200'   Walk 10 feet activity   Assist     Assist level: Supervision/Verbal cueing Assistive device: Rollator   Walk 50 feet activity   Assist    Assist level: Supervision/Verbal cueing Assistive device: Rollator    Walk 150 feet activity   Assist Walk 150 feet activity did not occur: Safety/medical concerns (fatigue)  Assist level: Supervision/Verbal cueing Assistive device: Rollator    Walk 10 feet on uneven surface  activity   Assist Walk 10 feet on uneven surfaces activity did not occur: Safety/medical concerns         Wheelchair     Assist Will patient use wheelchair at discharge?: No Type of Wheelchair: Manual    Wheelchair assist level: Supervision/Verbal cueing Max wheelchair distance: 30'    Wheelchair 50 feet with 2 turns activity    Assist            Wheelchair 150 feet activity     Assist          Blood pressure (!) 100/56, pulse 74, temperature 98.4 F (36.9 C), temperature source Oral, resp. rate 20, height 5\' 11"  (1.803 m), weight 116.1 kg, SpO2 95 %.  Medical Problem List and Plan: 1.  Decreased functional mobility with generalized weakness as well as reported multiple falls secondary to traumatic right side subdural hematoma.  Status post craniotomy hematoma evacuation 06/19/2020             -patient may not shower             -ELOS/Goals: 07/07/20, supervision  -Continue CIR PT, OT, SLP.  2.  Antithrombotics: -DVT/anticoagulation: SCDs.             -antiplatelet therapy: N/A 3. Pain Management: Hydrocodone as needed. Pain is well controlled.  4. Mood: Provide emotional support              -antipsychotic agents: N/A 5. Neuropsych: This patient is capable of making decisions on his own behalf. Appreciate neuropsych eval.  6. Skin/Wound Care:   -Gerhardt Butt cream to buttocks scrotum perineal area after cleansing and drying  -Stasis dermatitis Bilateral pretibial- pt declines interventions--areas stable 7. Fluids/Electrolytes/Nutrition: Routine in and outs with follow-up chemistries  -eating well, 100% 8.  Seizure prophylaxis.  Continue Keppra 500 mg twice daily 9.  Gram-negative rod UTI.  Rocephin started 06/20/2020 and stopped 11/17 after completed course.   -resolved 10.  History of hypertension.  Patient on Lotensin 20 mg daily and HCTZ 25 mg daily prior to admission.  (Remain on hold) Vitals:   07/03/20 1937 07/04/20 0350  BP: (!) 125/55 (!) 100/56  Pulse: 62 74  Resp: 20 20  Temp: 98.5 F (36.9 C) 98.4 F (36.9 C)  SpO2: 98% 95%   -11/24 controlled off meds---no changes 11.  Diet-controlled diabetes mellitus.  Hemoglobin A1c 6.6.  SSI. RD education requested. High of 195 on 11/18.  12. Constipation: having  daily BM.    LOS: 9 days A FACE TO FACE EVALUATION WAS PERFORMED  Meredith Staggers 07/04/2020, 8:25 AM

## 2020-07-05 DIAGNOSIS — S065X1D Traumatic subdural hemorrhage with loss of consciousness of 30 minutes or less, subsequent encounter: Secondary | ICD-10-CM

## 2020-07-05 DIAGNOSIS — E1165 Type 2 diabetes mellitus with hyperglycemia: Secondary | ICD-10-CM

## 2020-07-05 DIAGNOSIS — N179 Acute kidney failure, unspecified: Secondary | ICD-10-CM

## 2020-07-05 DIAGNOSIS — Z8679 Personal history of other diseases of the circulatory system: Secondary | ICD-10-CM

## 2020-07-05 DIAGNOSIS — E871 Hypo-osmolality and hyponatremia: Secondary | ICD-10-CM

## 2020-07-05 DIAGNOSIS — Z298 Encounter for other specified prophylactic measures: Secondary | ICD-10-CM

## 2020-07-05 LAB — GLUCOSE, CAPILLARY
Glucose-Capillary: 138 mg/dL — ABNORMAL HIGH (ref 70–99)
Glucose-Capillary: 151 mg/dL — ABNORMAL HIGH (ref 70–99)
Glucose-Capillary: 138 mg/dL — ABNORMAL HIGH (ref 70–99)
Glucose-Capillary: 217 mg/dL — ABNORMAL HIGH (ref 70–99)

## 2020-07-05 MED ORDER — LEVETIRACETAM 250 MG PO TABS
250.0000 mg | ORAL_TABLET | Freq: Two times a day (BID) | ORAL | Status: DC
  Administered 2020-07-05 – 2020-07-07 (×4): 250 mg via ORAL
  Filled 2020-07-05 (×5): qty 1

## 2020-07-05 NOTE — Discharge Summary (Signed)
Physician Discharge Summary  Patient ID: Caid Radin MRN: [id number] DOB/AGE: [birth year] 80 y.o.  Admit date: 06/25/2020 Discharge date: 07/07/2020  Discharge Diagnoses:  Principal Problem:   Traumatic subdural hematoma ([place]) Active Problems:   Pressure injury of skin   AKI (acute kidney injury) ([facility])   History of hypertension   Seizure prophylaxis   Controlled type 2 diabetes mellitus with hyperglycemia, without long-term current use of insulin (H[facility]) Gram-negative rod UTI AKI on CKD  Discharged Condition: Stable  Significant Diagnostic Studies: CT Head Wo Contrast  Result Date: 06/19/2020 CLINICAL DATA:  Dizziness with imbalance for 4 days. Multiple falls. EXAM: CT HEAD WITHOUT CONTRAST TECHNIQUE: Contiguous axial images were obtained from the base of the skull through the vertex without intravenous contrast. COMPARISON:  None. FINDINGS: Brain: A moderately large mixed density subdural hematoma over the right frontoparietal convexity measures up to 2.5 cm in thickness. There is mass effect on the underlying frontal and parietal lobes with sulcal effacement, partial effacement of the right lateral ventricle, and 8 mm of leftward midline shift. No acute infarct is identified. Mild cerebral atrophy is within normal limits for age. Vascular: Calcified atherosclerosis at the skull base. No hyperdense vessel. Skull: No fracture or suspicious osseous lesion. Sinuses/Orbits: Paranasal sinuses and mastoid air cells are clear. Bilateral cataract extraction. Other: None. IMPRESSION: Moderately large right-sided subdural hematoma with 8 mm of leftward midline shift. Critical Value/emergent results were called by telephone at the time of interpretation on 06/19/2020 at 11:42 am to provider Suella Broad, who verbally acknowledged these results. Electronically Signed   By: Logan Bores M.D.   On: 06/19/2020 11:43   MR BRAIN WO CONTRAST  Result Date: 06/21/2020 CLINICAL DATA:  Subdural hematoma with  decompression. Worsening generalized weakness. EXAM: MRI HEAD WITHOUT CONTRAST TECHNIQUE: Multiplanar, multiecho pulse sequences of the brain and surrounding structures were obtained without intravenous contrast. COMPARISON:  Head CT from 2 days ago FINDINGS: Brain: Interval decompression of subdural hematoma on the right, with drain in place. The collection and mass effect has diminished significantly. Maximal collection thickness is 11 mm today, as measured on coronal T2 weighted imaging. Midline shift has decreased to 3 mm (previously 7 mm). No acute infarct, hydrocephalus, or masslike finding. Vascular: Preserved flow voids Skull and upper cervical spine: Normal marrow signal Sinuses/Orbits: Negative IMPRESSION: 1. Interval decompression of the subdural hematoma with significantly improved mass effect. Midline shift is decreased to 3 mm. 2. The brain itself appears normal for age.  No acute infarct. Electronically Signed   By: Monte Fantasia M.D.   On: 06/21/2020 06:53   US RENAL  Result Date: 06/19/2020 CLINICAL DATA:  CKD EXAM: RENAL / URINARY TRACT ULTRASOUND COMPLETE COMPARISON:  None. FINDINGS: Right Kidney: Renal measurements: 11.4 x 5.4 x 5.1 cm = volume: 164 mL. Cortical echogenicity is within normal limits. Mild right hydronephrosis with central calyceal dilatation. No shadowing calculus or concerning renal mass. Left Kidney: Renal measurements: 10.3 x 5.5 x 4.0 cm = volume: 117.4 mL. Cortical echogenicity is within normal limits. 4.9 x 1.5 x 2.1 cm hypoechoic lesion arising from the upper pole left kidney appears to be a solid lesion with some internal color flow. No other concerning renal mass. No urolithiasis or hydronephrosis. Bladder: Bandage in the pelvic area significantly limiting evaluation of the low midline pelvis without clear visualization of the urinary. Other: None. IMPRESSION: 1. Mild right hydronephrosis. 2. 4.9 cm hypoechoic lesion arising from the upper pole left kidney. Could  reflect a solid mass. Recommend  further evaluation with renal mass protocol MRI on an outpatient basis if patient is able to tolerate contrast media. 3. Nonvisualization of the urinary bladder, partially due to bandaging overlying the low anterior pelvis. Electronically Signed   By: Lovena Le M.D.   On: 06/19/2020 22:54   DG Chest Port 1 View  Result Date: 06/19/2020 CLINICAL DATA:  Sudden onset of weakness on "Sunday. EXAM: PORTABLE CHEST 1 VIEW COMPARISON:  None. FINDINGS: The cardiac silhouette, mediastinal and hilar contours are within normal limits given the patient's age, AP projection and portable technique. Mild eventration of the right hemidiaphragm. No infiltrates, edema or effusions. No pneumothorax. The bony thorax is grossly intact. IMPRESSION: No acute cardiopulmonary findings. Electronically Signed   By: P.  Gallerani M.D.   On: 06/19/2020 12:20   ECHOCARDIOGRAM COMPLETE  Result Date: 06/20/2020    ECHOCARDIOGRAM REPORT   Patient Name:   Holley Debell Date of Exam: 06/20/2020 Medical Rec #:  [id number]    Height:       71.0 in Accession #:    [id number]   Weight:       266.1 lb Date of Birth:  09/27/1940     BSA:          2.381 m Patient Age:    79 years     BP:           81/53 mmHg Patient Gender: M            HR:           62"  bpm. Exam Location:  Inpatient Procedure: 2D Echo Indications:    murmur 785.2  History:        Patient has no prior history of Echocardiogram examinations.                 Aortic Valve Disease; Risk Factors:Hypertension.  Sonographer:    Johny Chess Referring Phys: [id number] DAVID TAT IMPRESSIONS  1. Severe aortic stenosis.  2. Left ventricular ejection fraction, by estimation, is 55 to 60%. The left ventricle has normal function. The left ventricle has no regional wall motion abnormalities. There is moderate left ventricular hypertrophy. Left ventricular diastolic parameters are consistent with Grade II diastolic dysfunction (pseudonormalization).  3. Right  ventricular systolic function is normal. The right ventricular size is normal.  4. Left atrial size was mildly dilated.  5. The mitral valve is normal in structure. Mild mitral valve regurgitation. No evidence of mitral stenosis. Severe mitral annular calcification.  6. The aortic valve is calcified. There is severe calcifcation of the aortic valve. There is severe thickening of the aortic valve. Aortic valve regurgitation is mild to moderate. Severe aortic valve stenosis. Aortic valve area, by VTI measures 0.57 cm. Aortic valve mean gradient measures 51.5 mmHg. Aortic valve Vmax measures 4.60 m/s.  7. Aortic dilatation noted. There is mild dilatation of the ascending aorta, measuring 38 mm.  8. The inferior vena cava is normal in size with greater than 50% respiratory variability, suggesting right atrial pressure of 3 mmHg. Comparison(s): No prior Echocardiogram. FINDINGS  Left Ventricle: Left ventricular ejection fraction, by estimation, is 55 to 60%. The left ventricle has normal function. The left ventricle has no regional wall motion abnormalities. The left ventricular internal cavity size was normal in size. There is  moderate left ventricular hypertrophy. Left ventricular diastolic parameters are consistent with Grade II diastolic dysfunction (pseudonormalization). Right Ventricle: The right ventricular size is normal. No increase in right ventricular wall thickness. Right ventricular systolic function  is normal. Left Atrium: Left atrial size was mildly dilated. Right Atrium: Right atrial size was normal in size. Pericardium: There is no evidence of pericardial effusion. Mitral Valve: The mitral valve is normal in structure. Severe mitral annular calcification. Mild mitral valve regurgitation. No evidence of mitral valve stenosis. Tricuspid Valve: The tricuspid valve is normal in structure. Tricuspid valve regurgitation is not demonstrated. No evidence of tricuspid stenosis. Aortic Valve: The aortic valve is  calcified. There is severe calcifcation of the aortic valve. There is severe thickening of the aortic valve. Aortic valve regurgitation is mild to moderate. Severe aortic stenosis is present. Aortic valve mean gradient measures 51.5 mmHg. Aortic valve peak gradient measures 84.6 mmHg. Aortic valve area, by VTI measures 0.57 cm. Pulmonic Valve: The pulmonic valve was normal in structure. Pulmonic valve regurgitation is mild. No evidence of pulmonic stenosis. Aorta: Aortic dilatation noted. There is mild dilatation of the ascending aorta, measuring 38 mm. Venous: The inferior vena cava is normal in size with greater than 50% respiratory variability, suggesting right atrial pressure of 3 mmHg. IAS/Shunts: No atrial level shunt detected by color flow Doppler.  LEFT VENTRICLE PLAX 2D LVIDd:         5.20 cm  Diastology LVIDs:         3.70 cm  LV e' medial:    5.66 cm/s LV PW:         1.50 cm  LV E/e' medial:  25.4 LV IVS:        1.40 cm  LV e' lateral:   5.98 cm/s LVOT diam:     2.20 cm  LV E/e' lateral: 24.1 LV SV:         67 LV SV Index:   28 LVOT Area:     3.80 cm  RIGHT VENTRICLE             IVC RV S prime:     12.70 cm/s  IVC diam: 2.10 cm TAPSE (M-mode): 1.8 cm LEFT ATRIUM             Index       RIGHT ATRIUM           Index LA Vol (A2C):   97.4 ml 40.91 ml/m RA Area:     16.70 cm LA Vol (A4C):   77.3 ml 32.47 ml/m RA Volume:   44.50 ml  18.69 ml/m LA Biplane Vol: 90.6 ml 38.06 ml/m  AORTIC VALVE AV Area (Vmax):    0.60 cm AV Area (Vmean):   0.55 cm AV Area (VTI):     0.57 cm AV Vmax:           460.00 cm/s AV Vmean:          337.500 cm/s AV VTI:            1.175 m AV Peak Grad:      84.6 mmHg AV Mean Grad:      51.5 mmHg LVOT Vmax:         72.70 cm/s LVOT Vmean:        48.500 cm/s LVOT VTI:          0.178 m LVOT/AV VTI ratio: 0.15  AORTA Ao Root diam: 3.60 cm Ao Asc diam:  3.80 cm MITRAL VALVE MV Area (PHT): 2.87 cm     SHUNTS MV Decel Time: 264 msec     Systemic VTI:  0.18 m MV E velocity: 144.00 cm/s   Systemic Diam: 2.20 cm MV A velocity: 57.30  cm/s MV E/A ratio:  2.51 Candee Furbish MD Electronically signed by Candee Furbish MD Signature Date/Time: 06/20/2020/1:34:34 PM    Final     Labs:  Basic Metabolic Panel: No results for input(s): NA, K, CL, CO2, GLUCOSE, BUN, CREATININE, CALCIUM, MG, PHOS in the last 168 hours.  CBC: No results for input(s): WBC, NEUTROABS, HGB, HCT, MCV, PLT in the last 168 hours.  CBG: Recent Labs  Lab 07/04/20 1712 07/04/20 2106 07/05/20 0644 07/05/20 1144 07/05/20 1651  GLUCAP 158* 216* 138* 151* 138*   Family history.  Positive for hypertension hyperlipidemia.  Negative for colon cancer esophageal cancer or rectal cancer Brief HPI:   Samanyu Tinnell is a 79 y.o. right-handed male with history of diabetes mellitus and hypertension.  Lives alone independent prior to admission using a Rollator.  He has a granddaughter with good support.  Presented 06/19/2020 with reports of frequent falls as well as generalized weakness.  No reported loss of consciousness.  CT of the head showed moderately large right sided subdural hematoma with 8 mm leftward midline shift.  Admission chemistry sodium 130 glucose 177 BUN 29 creatinine 1.54 CK 86 hemoglobin 15.3 urine culture greater 100,000 gram-negative rods placed on Rocephin, lactic acid 1.4.  Patient underwent craniotomy hematoma evacuation 06/19/2020 per Dr. Vertell Limber.  Follow-up MRI 06/21/2020 showed interval decompression of subdural hematoma with significantly improved mass-effect.  Midline shift decreased to 3 mm.  Echocardiogram ejection fraction 55 to 60% no wall motion abnormalities grade 2 diastolic dysfunction.  Maintained on Keppra for seizure prophylaxis.  Wound care nurse follow-up for multiple wounds on scrotum buttocks perineum wound care as directed.  Therapy evaluations completed and patient was admitted for a comprehensive rehab program   Hospital Course: Morry Veiga was admitted to rehab 06/25/2020 for inpatient  therapies to consist of PT, ST and OT at least three hours five days a week. Past admission physiatrist, therapy team and rehab RN have worked together to provide customized collaborative inpatient rehab.  Pertaining to patient's traumatic right side subdural hematoma status post craniotomy hematoma evacuation 06/19/2020.  Surgical site healing nicely patient would follow-up neurosurgery.  Pain managed with use of hydrocodone as needed.  DVT prophylaxis with SCDs.  Keppra for seizure prophylaxis.  No seizure activity.  Patient completed course of Rocephin for gram-negative rod UTI no dysuria or hematuria.  Blood pressure remained a bit soft and monitored.  Patient would follow-up with primary MD.  Diet-controlled diabetes mellitus hemoglobin A1c 6.6 diabetic teaching.  AKI on CKD latest creatinine 1.45.  Gerhardt Butt cream 3 times daily to buttocks scrotum and perineal area after cleaning and drying as well as ongoing education for maintaining and monitoring skin   Blood pressures were monitored on TID basis and soft and monitored  Diabetes has been monitored with ac/hs CBG checks and SSI was use prn for tighter BS control.    Rehab course: During patient's stay in rehab weekly team conferences were held to monitor patient's progress, set goals and discuss barriers to discharge. At admission, patient required minimal assist stand pivot transfers minimal assist 15 feet rolling walker.  Minimal assist upper body bathing mod assist lower body bathing mod assist upper body dressing max is lower body dressing   Physical exam.  Blood pressure 133/66 pulse 69 temperature 98 respirations 18 oxygen saturations 98% room air Constitutional.  No acute distress HEENT Head.  Normocephalic and atraumatic craniotomy site clean and dry Eyes.  Pupils round and reactive to light no discharge without nystagmus  Neck.  Supple nontender no JVD without thyromegaly Cardiac regular rate rhythm without extra sounds or murmur  heard Abdomen.  Soft nontender positive bowel sounds without rebound Extremities.  No clubbing cyanosis or edema Skin.  Stage I pressure injury on sacrum as well as venous stasis changes on legs Musculoskeletal. Comments.  4 -/5 in upper extremities and equal biceps triceps wrist extension and grip 4 -/5 in lower extremities hip flexion knee extension plantar flexion dorsiflexion 2/5 bilateral and has decreased range of motion on DF bilaterally Neurologic.  Alert oriented follows commands  He/  has had improvement in activity tolerance, balance, postural control as well as ability to compensate for deficits. He/ has had improvement in functional use RUE/LUE  and RLE/LLE as well as improvement in awareness.  Patient is performing bed mobility consistently at supervision level.  Sit to stand and stand pivot transfers consistently with contact-guard ambulates 200 feet Rollator with close supervision.  Supervision upper body bathing supervision lower body bathing set up upper body dressing supervision lower body dressing.  Full family teaching completed plan discharge to home       Disposition: Discharge to home   Diet: Diabetic diet  Special Instructions: No driving smoking or alcohol  Medications at discharge 1.  Tylenol as needed 2.  Colace 100 mg twice daily 3.  Gerhardt Butt cream 3 times daily to affected areas 4.  Hydrocodone 1 tablet every 4 hours as needed pain 5.  Keppra 250 mg twice daily 6.  Protonix 40 mg daily  30-35 minutes were spent completing discharge summary and discharge planning  Discharge Instructions    Ambulatory referral to Physical Medicine Rehab   Complete by: As directed    Follow-up 4 weeks traumatic SDH       Follow-up Information    Erline Levine, MD. Call on 07/09/2020.   Specialty: Neurosurgery Why: Call for appointment Contact information: [address] [place] [address] [phone]        Izora Ribas, MD  Follow up.   Specialty: Physical Medicine and Rehabilitation Why: Office to call for appointment Contact information: [address] [place] [address] [phone]        Neale Burly, MD. Call on 07/09/2020.   Specialty: Internal Medicine Why: For post hospital follow up Contact information: [address] [place] [address] 038 [phone]               Signed: Cathlyn Parsons 07/05/2020, 6:08 PM

## 2020-07-05 NOTE — Progress Notes (Signed)
[place] PHYSICAL MEDICINE & REHABILITATION PROGRESS NOTE   Subjective/Complaints: Patient seen sitting up in his chair this AM.  He states he slept well overnight.    ROS: Denies CP, SOB, N/V/D  Objective:   No results found. No results for input(s): WBC, HGB, HCT, PLT in the last 72 hours. No results for input(s): NA, K, CL, CO2, GLUCOSE, BUN, CREATININE, CALCIUM in the last 72 hours.  Intake/Output Summary (Last 24 hours) at 07/05/2020 1244 Last data filed at 07/05/2020 0900 Gross per 24 hour  Intake 960 ml  Output --  Net 960 ml     Pressure Injury 06/25/20 Sacrum Mid Stage 1 -  Intact skin with non-blanchable redness of a localized area usually over a bony prominence. area to mid-sacrum (Active)  06/25/20 1530  Location: Sacrum  Location Orientation: Mid  Staging: Stage 1 -  Intact skin with non-blanchable redness of a localized area usually over a bony prominence.  Wound Description (Comments): area to mid-sacrum  Present on Admission: Yes     Pressure Injury 06/25/20 Heel Right;Left;Posterior Stage 1 -  Intact skin with non-blanchable redness of a localized area usually over a bony prominence. Right and left heel extremely slow to blanch. Will place foam dressing to both. (Active)  06/25/20 1515  Location: Heel  Location Orientation: Right;Left;Posterior  Staging: Stage 1 -  Intact skin with non-blanchable redness of a localized area usually over a bony prominence.  Wound Description (Comments): Right and left heel extremely slow to blanch. Will place foam dressing to both.  Present on Admission: Yes    Physical Exam: Vital Signs Blood pressure 127/69, pulse 81, temperature 98.9 F (37.2 C), resp. rate 17, height 5\' 11"  (1.803 m), weight 116.1 kg, SpO2 97 %. Constitutional: No distress . Vital signs reviewed. HENT: Traumatic. Crani incision. Eyes: EOMI. No discharge. Cardiovascular: No JVD.  RRR. Respiratory: Normal effort.  No stridor.  Bilateral clear to  auscultation. GI: Non-distended.  BS +. Skin: Warm and dry.   Vasc changes b/l LE Skin tears Psych: Normal mood.  Normal behavior. Musc: Arthritic changes right hand Neuro: Alert Motor: B/l UE: 4/5 proximal to distal B/l LE: 4--4/5 HF, KE, 3+/5 ADF  Assessment/Plan: 1. Functional deficits which require 3+ hours per day of interdisciplinary therapy in a comprehensive inpatient rehab setting.  Physiatrist is providing close team supervision and 24 hour management of active medical problems listed below.  Physiatrist and rehab team continue to assess barriers to discharge/monitor patient progress toward functional and medical goals  Care Tool:  Bathing    Body parts bathed by patient: Right arm, Left arm, Chest, Abdomen, Face, Left upper leg, Right upper leg, Right lower leg, Left lower leg, Buttocks, Front perineal area   Body parts bathed by helper: Front perineal area, Buttocks     Bathing assist Assist Level: Supervision/Verbal cueing     Upper Body Dressing/Undressing Upper body dressing   What is the patient wearing?: Pull over shirt    Upper body assist Assist Level: Supervision/Verbal cueing    Lower Body Dressing/Undressing Lower body dressing      What is the patient wearing?: Pants     Lower body assist Assist for lower body dressing: Supervision/Verbal cueing     Toileting Toileting    Toileting assist Assist for toileting: Minimal Assistance - Patient > 75%     Transfers Chair/bed transfer  Transfers assist     Chair/bed transfer assist level: Supervision/Verbal cueing     Locomotion Ambulation  Ambulation assist      Assist level: Supervision/Verbal cueing Assistive device: Rollator Max distance: 15'   Walk 10 feet activity   Assist     Assist level: Supervision/Verbal cueing Assistive device: Rollator   Walk 50 feet activity   Assist    Assist level: Supervision/Verbal cueing Assistive device: Rollator    Walk 150  feet activity   Assist Walk 150 feet activity did not occur: Safety/medical concerns (fatigue)  Assist level: Supervision/Verbal cueing Assistive device: Rollator    Walk 10 feet on uneven surface  activity   Assist Walk 10 feet on uneven surfaces activity did not occur: Safety/medical concerns         Wheelchair     Assist Will patient use wheelchair at discharge?: No Type of Wheelchair: Manual    Wheelchair assist level: Supervision/Verbal cueing Max wheelchair distance: 30'    Wheelchair 50 feet with 2 turns activity    Assist            Wheelchair 150 feet activity     Assist          Blood pressure 127/69, pulse 81, temperature 98.9 F (37.2 C), resp. rate 17, height 5\' 11"  (1.803 m), weight 116.1 kg, SpO2 97 %.  Medical Problem List and Plan: 1.  Decreased functional mobility with generalized weakness as well as reported multiple falls secondary to traumatic right side subdural hematoma.  Status post craniotomy hematoma evacuation 06/19/2020  Continue CR 2.  Antithrombotics: -DVT/anticoagulation: SCDs.             -antiplatelet therapy: N/A 3. Pain Management: Hydrocodone as needed.   Controlled on 11/25 4. Mood: Provide emotional support             -antipsychotic agents: N/A 5. Neuropsych: This patient is capable of making decisions on his own behalf. Appreciate neuropsych eval.  6. Skin/Wound Care:   -Gerhardt Butt cream to buttocks scrotum perineal area after cleansing and drying  -Stasis dermatitis Bilateral pretibial- pt declines interventions--areas stable 7. Fluids/Electrolytes/Nutrition: Routine in and outs. 8.  Seizure prophylaxis.  Keppra 500 mg twice daily, decreased to 25 BID on 11/25 9.  Gram-negative rod UTI.  Rocephin started 06/20/2020 and stopped 11/17 after completed course.  10.  History of hypertension.  Patient on Lotensin 20 mg daily and HCTZ 25 mg daily prior to admission.  (Remain on hold) Vitals:   07/04/20  1924 07/05/20 0445  BP: (!) 121/55 127/69  Pulse: (!) 59 81  Resp:    Temp: 98.1 F (36.7 C) 98.9 F (37.2 C)  SpO2: 99% 97%   Controlled on 11/25 11.  Diet-controlled diabetes mellitus.  Hemoglobin A1c 6.6.  SSI. RD education requested.   Elevated on 11/25 12. Constipation: having daily BM.  13. Hyponatremia  Na+ 134 on 11/16, labs ordered for tomorrow 14. 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

## 2020-07-06 ENCOUNTER — Encounter (HOSPITAL_COMMUNITY): Payer: Self-pay | Admitting: Occupational Therapy

## 2020-07-06 ENCOUNTER — Inpatient Hospital Stay (HOSPITAL_COMMUNITY): Payer: Self-pay

## 2020-07-06 ENCOUNTER — Ambulatory Visit (HOSPITAL_COMMUNITY): Payer: Self-pay

## 2020-07-06 ENCOUNTER — Inpatient Hospital Stay (HOSPITAL_COMMUNITY): Payer: Self-pay | Admitting: Occupational Therapy

## 2020-07-06 LAB — CBC WITH DIFFERENTIAL/PLATELET
Abs Immature Granulocytes: 0.04 10*3/uL (ref 0.00–0.07)
Basophils Absolute: 0.1 10*3/uL (ref 0.0–0.1)
Basophils Relative: 1 %
Eosinophils Absolute: 0.4 10*3/uL (ref 0.0–0.5)
Eosinophils Relative: 5 %
HCT: 39.5 % (ref 39.0–52.0)
Hemoglobin: 12.5 g/dL — ABNORMAL LOW (ref 13.0–17.0)
Immature Granulocytes: 0 %
Lymphocytes Relative: 23 %
Lymphs Abs: 2.1 10*3/uL (ref 0.7–4.0)
MCH: 25.9 pg — ABNORMAL LOW (ref 26.0–34.0)
MCHC: 31.6 g/dL (ref 30.0–36.0)
MCV: 82 fL (ref 80.0–100.0)
Monocytes Absolute: 1.1 10*3/uL — ABNORMAL HIGH (ref 0.1–1.0)
Monocytes Relative: 12 %
Neutro Abs: 5.4 10*3/uL (ref 1.7–7.7)
Neutrophils Relative %: 59 %
Platelets: 224 10*3/uL (ref 150–400)
RBC: 4.82 MIL/uL (ref 4.22–5.81)
RDW: 14.2 % (ref 11.5–15.5)
WBC: 9.3 10*3/uL (ref 4.0–10.5)
nRBC: 0 % (ref 0.0–0.2)

## 2020-07-06 LAB — BASIC METABOLIC PANEL
Anion gap: 9 (ref 5–15)
BUN: 32 mg/dL — ABNORMAL HIGH (ref 8–23)
CO2: 21 mmol/L — ABNORMAL LOW (ref 22–32)
Calcium: 8.6 mg/dL — ABNORMAL LOW (ref 8.9–10.3)
Chloride: 103 mmol/L (ref 98–111)
Creatinine, Ser: 2.1 mg/dL — ABNORMAL HIGH (ref 0.61–1.24)
GFR, Estimated: 31 mL/min — ABNORMAL LOW (ref >60)
Glucose, Bld: 158 mg/dL — ABNORMAL HIGH (ref 70–99)
Potassium: 4.3 mmol/L (ref 3.5–5.1)
Sodium: 133 mmol/L — ABNORMAL LOW (ref 135–145)

## 2020-07-06 LAB — GLUCOSE, CAPILLARY
Glucose-Capillary: 150 mg/dL — ABNORMAL HIGH (ref 70–99)
Glucose-Capillary: 155 mg/dL — ABNORMAL HIGH (ref 70–99)
Glucose-Capillary: 157 mg/dL — ABNORMAL HIGH (ref 70–99)
Glucose-Capillary: 211 mg/dL — ABNORMAL HIGH (ref 70–99)

## 2020-07-06 MED ORDER — LEVETIRACETAM 250 MG PO TABS: 250.0000 mg | ORAL_TABLET | Freq: Two times a day (BID) | ORAL | 0 refills | Status: DC

## 2020-07-06 MED ORDER — DOCUSATE SODIUM 100 MG PO CAPS: 100.0000 mg | ORAL_CAPSULE | Freq: Two times a day (BID) | ORAL | 0 refills | Status: DC | PRN

## 2020-07-06 MED ORDER — PANTOPRAZOLE SODIUM 40 MG PO TBEC: 40.0000 mg | DELAYED_RELEASE_TABLET | Freq: Every day | ORAL | 0 refills | Status: DC

## 2020-07-06 MED ORDER — SODIUM CHLORIDE 0.9 % IV BOLUS: 500.0000 mL | Freq: Once | INTRAVENOUS | Status: DC

## 2020-07-06 NOTE — Progress Notes (Signed)
Patient ordered bolus of IV fluids. Informed patient of orders and to the affect of why he was getting them. Patient refused and declined to proceed with orders and stated that he feels that he does not need the IV fluids. MD Posey Pronto made aware.

## 2020-07-06 NOTE — Progress Notes (Signed)
Occupational Therapy Session Note  Patient Details  Name: Brett Williamson MRN: [id number] Date of Birth: [birth year]  Today's Date: 07/06/2020 OT Individual Time: 0820-0900 OT Individual Time Calculation (min): 40 min    Short Term Goals: Week 1:  OT Short Term Goal 1 (Week 1): Pt will tolerate standing for 3 minutes wiithin BADL task OT Short Term Goal 1 - Progress (Week 1): Met OT Short Term Goal 2 (Week 1): Pt will be given built up handles to increase independence with self-feeding and grooming tasks OT Short Term Goal 2 - Progress (Week 1): Met OT Short Term Goal 3 (Week 1): Pt will complete toilet transfer with min A and LRAD OT Short Term Goal 3 - Progress (Week 1): Met Week 2:  OT Short Term Goal 1 (Week 2): LTG=STG 2/2 ELOS  Skilled Therapeutic Interventions/Progress Updates:    1:1 Pt doffing soiled brief with nurse tech when arrived. Ambulated with Rollator with close supervision to the bathroom to void and clean self. Pt required A to performed pericare while pt in standing. Pt able to perform standing task with supervision. Pt performed ambulation back to recliner to dress. Pt able to don clean pants and brief. Pt does require A to doff socks and don shoes. Pt with very swollen feet - recommendation elevation to feet. Pt ambulated from his room to the ortho gym >100 feet with Rollator with supervision. Pt took a seated rest break on Rollator and ambulated back. Left resting in the recliner.   Therapy Documentation Precautions:  Precautions Precautions: Fall Restrictions Weight Bearing Restrictions: No Pain:  no c/o pain   Therapy/Group: Individual Therapy  [name] [facility] 07/06/2020, 3:17 PM

## 2020-07-06 NOTE — Progress Notes (Addendum)
Patient ID: Brett Williamson, male   DOB: 01-26-1941, 79 y.o.   MRN: [id number]   SW sent referral to [facility] for HHPT/OT/Aide. Sw waiting on follow-up.  *SW received updates from [facility] stating she is waiting for branch to give update on if they can accept referral.  SW met with pt, pt dtr [name] and granddaughter [name] in room to discuss d/c plan. SW informed on waiting on updates from [facility] and if no answer by date of d/c, SW will call to confirm. SW encouraged looking at after visit summary for all information. Family intends to look for a tub transfer bench and see if they can find cheaper. They indicated if they are unable to find, they will get from [facility]. SW encouraged them to call [facility] directly about item. Preferred pharmacy: [facility] in [place] [facility]).  *SW received updates from [place] that [facility] will accept HHPT/OT/aide referral. Start of care likely to be on Wednesday. SW updated pt.   Loralee Pacas, MSW, [place] Office: [phone] Cell: [phone] Fax: [phone]

## 2020-07-06 NOTE — Plan of Care (Signed)
  Problem: Consults Goal: RH BRAIN INJURY PATIENT EDUCATION Description: Description: See Patient Education module for eduction specifics Outcome: Progressing   Problem: RH BOWEL ELIMINATION Goal: RH STG MANAGE BOWEL W/MEDICATION W/ASSISTANCE Description: STG Manage Bowel with Medication with Mod I Assistance. Outcome: Progressing   Problem: RH BLADDER ELIMINATION Goal: RH STG MANAGE BLADDER WITH EQUIPMENT WITH ASSISTANCE Description: STG Manage Bladder With Equipment With Mod I Assistance Outcome: Progressing   Problem: RH SKIN INTEGRITY Goal: RH STG SKIN FREE OF INFECTION/BREAKDOWN Description: Mod I  Outcome: Progressing Goal: RH STG ABLE TO PERFORM INCISION/WOUND CARE W/ASSISTANCE Description: STG Able To Perform Incision/Wound Care With Mod I Assistance. Outcome: Progressing   Problem: RH SAFETY Goal: RH STG ADHERE TO SAFETY PRECAUTIONS W/ASSISTANCE/DEVICE Description: STG Adhere to Safety Precautions With Mod I Assistance/Device. Outcome: Progressing Goal: RH STG DECREASED RISK OF FALL WITH ASSISTANCE Description: STG Decreased Risk of Fall With Mod I Assistance. Outcome: Progressing   Problem: RH PAIN MANAGEMENT Goal: RH STG PAIN MANAGED AT OR BELOW PT'S PAIN GOAL Description: <2 Outcome: Progressing

## 2020-07-06 NOTE — Plan of Care (Signed)
Problem: RH Balance Goal: LTG: Patient will maintain dynamic sitting balance (OT) Description: LTG:  Patient will maintain dynamic sitting balance with assistance during activities of daily living (OT) Outcome: Completed/Met Goal: LTG Patient will maintain dynamic standing with ADLs (OT) Description: LTG:  Patient will maintain dynamic standing balance with assist during activities of daily living (OT)  Outcome: Completed/Met   Problem: Sit to Stand Goal: LTG:  Patient will perform sit to stand in prep for activites of daily living with assistance level (OT) Description: LTG:  Patient will perform sit to stand in prep for activites of daily living with assistance level (OT) Outcome: Completed/Met   Problem: RH Eating Goal: LTG Patient will perform eating w/assist, cues/equip (OT) Description: LTG: Patient will perform eating with assist, with/without cues using equipment (OT) Outcome: Completed/Met   Problem: RH Grooming Goal: LTG Patient will perform grooming w/assist,cues/equip (OT) Description: LTG: Patient will perform grooming with assist, with/without cues using equipment (OT) Outcome: Completed/Met   Problem: RH Bathing Goal: LTG Patient will bathe all body parts with assist levels (OT) Description: LTG: Patient will bathe all body parts with assist levels (OT) Outcome: Completed/Met   Problem: RH Dressing Goal: LTG Patient will perform upper body dressing (OT) Description: LTG Patient will perform upper body dressing with assist, with/without cues (OT). Outcome: Completed/Met Goal: LTG Patient will perform lower body dressing w/assist (OT) Description: LTG: Patient will perform lower body dressing with assist, with/without cues in positioning using equipment (OT) Outcome: Completed/Met   Problem: RH Toileting Goal: LTG Patient will perform toileting task (3/3 steps) with assistance level (OT) Description: LTG: Patient will perform toileting task (3/3 steps) with  assistance level (OT)  Outcome: Completed/Met   Problem: RH Simple Meal Prep Goal: LTG Patient will perform simple meal prep w/assist (OT) Description: LTG: Patient will perform simple meal prep with assistance, with/without cues (OT). Outcome: Completed/Met   Problem: RH Toilet Transfers Goal: LTG Patient will perform toilet transfers w/assist (OT) Description: LTG: Patient will perform toilet transfers with assist, with/without cues using equipment (OT) Outcome: Completed/Met   Problem: RH Tub/Shower Transfers Goal: LTG Patient will perform tub/shower transfers w/assist (OT) Description: LTG: Patient will perform tub/shower transfers with assist, with/without cues using equipment (OT) Outcome: Completed/Met

## 2020-07-06 NOTE — Progress Notes (Signed)
[place] PHYSICAL MEDICINE & REHABILITATION PROGRESS NOTE   Subjective/Complaints: Patient seen sitting up in his chair watching TV this morning.  He states he slept well overnight.  He states he does not like to watch sports on TV, only the scores in the morning.  ROS: Denies CP, SOB, N/V/D  Objective:   No results found. Recent Labs    07/06/20 0435  WBC 9.3  HGB 12.5*  HCT 39.5  PLT 224   Recent Labs    07/06/20 0435  NA 133*  K 4.3  CL 103  CO2 21*  GLUCOSE 158*  BUN 32*  CREATININE 2.10*  CALCIUM 8.6*    Intake/Output Summary (Last 24 hours) at 07/06/2020 1620 Last data filed at 07/06/2020 1300 Gross per 24 hour  Intake 540 ml  Output --  Net 540 ml     Pressure Injury 06/25/20 Sacrum Mid Stage 1 -  Intact skin with non-blanchable redness of a localized area usually over a bony prominence. area to mid-sacrum (Active)  06/25/20 1530  Location: Sacrum  Location Orientation: Mid  Staging: Stage 1 -  Intact skin with non-blanchable redness of a localized area usually over a bony prominence.  Wound Description (Comments): area to mid-sacrum  Present on Admission: Yes     Pressure Injury 06/25/20 Heel Right;Left;Posterior Stage 1 -  Intact skin with non-blanchable redness of a localized area usually over a bony prominence. Right and left heel extremely slow to blanch. Will place foam dressing to both. (Active)  06/25/20 1515  Location: Heel  Location Orientation: Right;Left;Posterior  Staging: Stage 1 -  Intact skin with non-blanchable redness of a localized area usually over a bony prominence.  Wound Description (Comments): Right and left heel extremely slow to blanch. Will place foam dressing to both.  Present on Admission: Yes    Physical Exam: Vital Signs Blood pressure 108/67, pulse 89, temperature 97.9 F (36.6 C), resp. rate 20, height 5\' 11"  (1.803 m), weight 116.1 kg, SpO2 99 %. Constitutional: No distress . Vital signs reviewed. HENT:  Traumatic.  Cranial incision. Eyes: EOMI. No discharge. Cardiovascular: No JVD.  RRR. Respiratory: Normal effort.  No stridor.  Bilateral clear to auscultation. GI: Non-distended.  BS +. Skin: Warm and dry.   Vascular changes bilateral lower extremities Skin tears. Psych: Normal mood.  Normal behavior. Musc: Arthritic changes right hand.   Lower extremity edema. Neuro: Alert Motor: B/l UE: 4/5 proximal to distal, unchanged B/l LE: 4--4/5 HF, KE, 3+/5 ADF  Assessment/Plan: 1. Functional deficits which require 3+ hours per day of interdisciplinary therapy in a comprehensive inpatient rehab setting.  Physiatrist is providing close team supervision and 24 hour management of active medical problems listed below.  Physiatrist and rehab team continue to assess barriers to discharge/monitor patient progress toward functional and medical goals  Care Tool:  Bathing    Body parts bathed by patient: Right arm, Left arm, Chest, Abdomen, Face, Left upper leg, Right upper leg, Right lower leg, Left lower leg, Buttocks, Front perineal area   Body parts bathed by helper: Front perineal area, Buttocks     Bathing assist Assist Level: Supervision/Verbal cueing     Upper Body Dressing/Undressing Upper body dressing   What is the patient wearing?: Pull over shirt    Upper body assist Assist Level: Supervision/Verbal cueing    Lower Body Dressing/Undressing Lower body dressing      What is the patient wearing?: Pants     Lower body assist Assist for lower body  dressing: Supervision/Verbal cueing     Toileting Toileting    Toileting assist Assist for toileting: Supervision/Verbal cueing     Transfers Chair/bed transfer  Transfers assist     Chair/bed transfer assist level: Supervision/Verbal cueing     Locomotion Ambulation   Ambulation assist      Assist level: Supervision/Verbal cueing Assistive device: Rollator Max distance: 200'   Walk 10 feet  activity   Assist     Assist level: Supervision/Verbal cueing Assistive device: Rollator   Walk 50 feet activity   Assist    Assist level: Supervision/Verbal cueing Assistive device: Rollator    Walk 150 feet activity   Assist Walk 150 feet activity did not occur: Safety/medical concerns (fatigue)  Assist level: Supervision/Verbal cueing Assistive device: Rollator    Walk 10 feet on uneven surface  activity   Assist Walk 10 feet on uneven surfaces activity did not occur: Safety/medical concerns   Assist level: Supervision/Verbal cueing     Wheelchair     Assist Will patient use wheelchair at discharge?: No Type of Wheelchair: Manual    Wheelchair assist level: Supervision/Verbal cueing Max wheelchair distance: 30'    Wheelchair 50 feet with 2 turns activity    Assist            Wheelchair 150 feet activity     Assist          Blood pressure 108/67, pulse 89, temperature 97.9 F (36.6 C), resp. rate 20, height 5\' 11"  (1.803 m), weight 116.1 kg, SpO2 99 %.  Medical Problem List and Plan: 1.  Decreased functional mobility with generalized weakness as well as reported multiple falls secondary to traumatic right side subdural hematoma.  Status post craniotomy hematoma evacuation 06/19/2020  Continue CR 2.  Antithrombotics: -DVT/anticoagulation: SCDs.             -antiplatelet therapy: N/A 3. Pain Management: Hydrocodone as needed.   Controlled on 11/26 4. Mood: Provide emotional support             -antipsychotic agents: N/A 5. Neuropsych: This patient is capable of making decisions on his own behalf. Appreciate neuropsych eval.  6. Skin/Wound Care:   -Gerhardt Butt cream to buttocks scrotum perineal area after cleansing and drying  -Stasis dermatitis Bilateral pretibial- pt declines interventions--areas stable 7. Fluids/Electrolytes/Nutrition: Routine in and outs. 8.  Seizure prophylaxis.  Keppra 500 mg twice daily, decreased to 25  BID on 11/25, will discharge on this and DC as outpatient 9.  Gram-negative rod UTI.  Rocephin started 06/20/2020 and stopped 11/17 after completed course.  10.  History of hypertension.  Patient on Lotensin 20 mg daily and HCTZ 25 mg daily prior to admission.  (Remain on hold) Vitals:   07/06/20 1300 07/06/20 1357  BP: 108/67 108/67  Pulse: 90 89  Resp: 20 20  Temp: 97.9 F (36.6 C) 97.9 F (36.6 C)  SpO2:  99%   Controlled on 11/26 11.  Diet-controlled diabetes mellitus.  Hemoglobin A1c 6.6.  SSI. RD education requested.   Elevated on 11/26, monitor ambulatory setting 12. Constipation: having daily BM.  13. Hyponatremia  Na+ 133 on 11/26 14. 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

## 2020-07-06 NOTE — Discharge Instructions (Signed)
Inpatient Rehab Discharge Instructions  Coen Miyasato Discharge date and time: 07/07/20   Activities/Precautions/ Functional Status: Activity: No driving or strenuous activity till cleared by MD. Diet: diabetic diet Wound Care: Routine skin checks.  Functional status:  ___ No restrictions     ___ Walk up steps independently _X__ 24/7 supervision/assistance   ___ Walk up steps with assistance ___ Intermittent supervision/assistance  ___ Bathe/dress independently ___ Walk with walker     _x__ Bathe/dress with assistance ___ Walk Independently    ___ Shower independently ___ Walk with assistance    ___ Shower with assistance __X_ No alcohol     ___ Return to work/school ________   COMMUNITY REFERRALS UPON DISCHARGE:    Home Health:   PT     OT    SNA                Agency: [facility]     Phone: [phone] *Start of care to begin on Wednesday (12/1). Please expect follow-up to schedule your home visit. If you have not received follow-up, be sure to call the branch directly.*   Medical Equipment/Items Ordered: tub transfer bench (patient to look for item first, and will call [facility] if unable to find)                                                 Agency/Supplier:[facility] [phone]   Special Instructions: 1. No driving, smoking or alcohol 2. No alcohol, aspirin or aspirin containing products.     My questions have been answered and I understand these instructions. I will adhere to these goals and the provided educational materials after my discharge from the hospital.  Patient/Caregiver Signature _______________________________ Date __________  Clinician Signature _______________________________________ Date __________  Please bring this form and your medication list with you to all your follow-up doctor's appointments.

## 2020-07-06 NOTE — Progress Notes (Signed)
Occupational Therapy Discharge Summary  Patient Details  Name: Brett Williamson MRN: [id number] Date of Birth: May 09, 1941  Today's Date: 07/06/2020 OT Individual Time: 9244-6286 OT Individual Time Calculation (min): 57 min   Pt greeted seated in recliner with daughter and granddaughter present for family education. Pt noted to have been incontinent of urine and soaked through pants. Pt reported the reason for incontinence here is that staff does not get here in time. Educated on timed toileting to get up and attempt to void every hour at home. Pt and family verbalized understanding. Pt was able to manage clothing and perform hygiene with set-up A in modified technique he uses at home using wet towel between his legs. Pt needed supervision for balance. Educated on importance of providing supervision to decrease risk of falls. OT provided pt with more built up handle foam to modify his shoe horn at home. Pt then ambulated to therapy apartment with rollator and supervision. Educated on tub bench transfer and pt demonstrated transfer at supervision level. Pt returned to room via wc 2/2 fatigue. Family then ambulated with pt back to recliner with supervision. Pt left seated in recliner with alarm belt on, call bell in reach, and needs met.   Patient has met 12 of 12 long term goals due to improved activity tolerance, improved balance, postural control and ability to compensate for deficits.  Patient to discharge at overall Supervision level.  Patient's care partner is independent to provide the necessary physical assistance at discharge.    Reasons goals not met: n/a  Recommendation:  Patient will benefit from ongoing skilled OT services in home health setting to continue to advance functional skills in the area of BADL and Reduce care partner burden.  Equipment: tub transfer bench, 3-in-1 BSC  Reasons for discharge: treatment goals met and discharge from hospital  Patient/family agrees with progress  made and goals achieved: Yes  OT Discharge Precautions/Restrictions  Precautions Precautions: Fall Restrictions Weight Bearing Restrictions: No Pain  denies pain ADL ADL Eating: Set up Grooming: Setup Upper Body Bathing: Supervision/safety Where Assessed-Upper Body Bathing: Shower Lower Body Bathing: Supervision/safety Where Assessed-Lower Body Bathing: Shower Upper Body Dressing: Setup Where Assessed-Upper Body Dressing: Chair Lower Body Dressing: Supervision/safety Where Assessed-Lower Body Dressing: Chair Toileting: Supervision/safety Where Assessed-Toileting: Glass blower/designer: Close supervision Toilet Transfer Method: Counselling psychologist: Raised toilet seat, Grab bars Tub/Shower Transfer: Close supervison Clinical cytogeneticist Method: Optometrist: Facilities manager: Close supervision Social research officer, government Method: Heritage manager: Radio broadcast assistant, Grab bars Cognition Overall Cognitive Status: Within Functional Limits for tasks assessed Arousal/Alertness: Awake/alert Orientation Level: Oriented X4 Safety/Judgment: Appears intact Sensation Coordination Gross Motor Movements are Fluid and Coordinated: No Fine Motor Movements are Fluid and Coordinated: No Coordination and Movement Description: 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 Mobility  Bed Mobility Supine to Sit: Supervision/Verbal cueing Sit to Supine: Supervision/Verbal cueing Transfers Sit to Stand: Supervision/Verbal cueing Stand to Sit: Supervision/Verbal cueing  Balance Static Sitting Balance Static Sitting - Level of Assistance: 7: Independent Dynamic Sitting Balance Dynamic Sitting - Level of Assistance: 5: Stand by assistance Static Standing Balance Static Standing - Level of Assistance: 5: Stand by assistance Dynamic Standing Balance Dynamic Standing - Balance Support: During functional  activity Dynamic Standing - Level of Assistance: 5: Stand by assistance Extremity/Trunk Assessment RUE Assessment RUE Assessment: Exceptions to [facility] Active Range of Motion (AROM) Comments: [facility] General Strength Comments: Limited grip strength 2/2 arthritic hands RUE Strength Right Shoulder Flexion:  4+/5 LUE Assessment LUE Assessment: Exceptions to [facility] Active Range of Motion (AROM) Comments: limited should ROM 70 degrees 2/2  2/2 baseline arthritis General Strength Comments: Limited grip strength 2/2 arthritic hands LUE Strength Left Shoulder Flexion: 3-/5  Daneen Schick Angelito Hopping 07/06/2020, 3:24 PM

## 2020-07-06 NOTE — Progress Notes (Signed)
Physical Therapy Discharge Summary  Patient Details  Name: Brett Williamson MRN: [id number] Date of Birth: October 31, 1940  Today's Date: 07/06/2020 PT Individual Time: 1000-1055 and 1430-1455 PT Individual Time Calculation (min): 55 min and 25 min Missed Time: 35 min (refusal to participate)   Patient has met 8 of 9 long term goals due to improved activity tolerance, improved balance and increased strength.  Patient to discharge at an ambulatory level Supervision.   Patient's care partner is independent to provide the necessary physical assistance at discharge.  Reasons goals not met: Pt refuses to attempt stair due to pain in knees. Says that he will only perform if visiting his girlfriend. Pt does not have stairs to enter home.  Recommendation:  Patient will benefit from ongoing skilled PT services in home health setting to continue to advance safe functional mobility, address ongoing impairments in strength, balance, ambulation, and minimize fall risk.  Equipment: No equipment provided  Reasons for discharge: treatment goals met and discharge from hospital  Patient/family agrees with progress made and goals achieved: Yes   Skilled Therapeutic Interventions: 1st Session: Pt received seated in recliner and agrees to therapy. Reports chronic pain in both knees. Number not provided. PT provides mobility, education, and repositioning to address pain. Sit to stand and stand step transfer to [facility] with rollator and supervision for cues on positioning and hand placement for safety. Pt taken via [place] transport for time management. Pt performs community Pharmacist, hospital, searching for vending machine and then operating vending machine to retrieve diet soft drink. PT provides minA for inserting cash into slot and provides CGA as pt flexes forward to retrieve soda. Pt performs car transfer with rollator and cues for hand placement and managing position to maneuver feet into car. Pt performs ramp navigation  with supervision and use of rollator. Pt ambulates 200' with rollator and supervision, with cues for upright gaze to improve posture and balance, and positioning for safe transfer back to chair. Stand step transfer back to recliner. Pt left seated in recliner with alarm intact and all needs within reach.  2nd Session: Pt received seated in recliner with family present for family education. Family reports that pt is moving "better than baseline" and have no questions concerning mobility. Pt reports that he has just walked "multiple times" with OT and does not want to leave room again. Pt is agreeable to perform bed mobility, however, and performs transfer to bed with supervision and cues for positioning, then supine<>sit independently. PT educates pt on benefit of continued mobility but pt refuses. Pt and family educated on strategies for mobilization that may elicit less pain due to pt's chronic arthritis. Pt misses 35 minutes of skilled PT due to refusal to participate.  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) Coordination Gross Motor Movements are Fluid and Coordinated: No Fine Motor Movements are Fluid and Coordinated: No Coordination and Movement Description: 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 Heel Shin Test: 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 Motor  Motor Motor: Within Functional Limits  Mobility Bed Mobility Bed Mobility: Rolling Right;Rolling Left;Supine to Sit;Sit to  Supine Rolling Right: Independent Rolling Left: Independent Supine to Sit: Independent Sit to Supine: Independent  Transfers Transfers: Stand to Sit;Sit to Stand;Stand Pivot Transfers Sit to Stand: Supervision/Verbal cueing Stand to Sit: Supervision/Verbal cueing Stand Pivot Transfers: Supervision/Verbal cueing Stand Pivot Transfer Details: Verbal cues for precautions/safety;Verbal cues for sequencing;Verbal cues for safe use of DME/AE Transfer (Assistive device): Rollator Locomotion  Gait Ambulation: Yes Gait Assistance: Supervision/Verbal cueing Gait Distance (Feet): 200 Feet Assistive device: Rollator Gait Assistance Details: Verbal cues for technique Gait Gait: Yes Gait Pattern: Impaired Gait Pattern: Decreased stride length;Trunk flexed Gait velocity: decreased Stairs / Additional Locomotion Stairs: No Ramp: Supervision/Verbal cueing Wheelchair Mobility Wheelchair Mobility: No  Trunk/Postural Assessment  Cervical Assessment Cervical Assessment:  (forward head) Thoracic Assessment Thoracic Assessment:  (kyphosis) Lumbar Assessment Lumbar Assessment:  (posterior pelvic tilt) Postural Control Postural Control: Within Functional Limits  Balance Balance Balance Assessed: Yes Static Sitting Balance Static Sitting - Balance Support: Feet supported;No upper extremity supported Static Sitting - Level of Assistance: 7: Independent Dynamic Sitting Balance Dynamic Sitting - Balance Support: Feet supported;During functional activity Dynamic Sitting - Level of Assistance: 5: Stand by assistance Static Standing Balance Static Standing - Balance Support: No upper extremity supported Static Standing - Level of Assistance: 5: Stand by assistance Dynamic Standing Balance Dynamic Standing - Balance Support: During functional activity;Bilateral upper extremity supported Dynamic Standing - Level of Assistance: 5: Stand by assistance Extremity Assessment  RUE Assessment RUE  Assessment: Exceptions to [facility] Active Range of Motion (AROM) Comments: WFL General Strength Comments: Limited grip strength 2/2 arthritic hands RUE Strength Right Shoulder Flexion: 4+/5 LUE Assessment LUE Assessment: Exceptions to [facility] Active Range of Motion (AROM) Comments: limited should ROM 70 degrees 2/2  2/2 baseline arthritis General Strength Comments: Limited grip strength 2/2 arthritic hands LUE Strength Left Shoulder Flexion: 3-/5 RLE Assessment General Strength Comments: grossly 4/5 LLE Assessment General Strength Comments: grossly 4/5    [name], PT, DPT 07/06/2020, 4:04 PM

## 2020-07-07 DIAGNOSIS — S065X0A Traumatic subdural hemorrhage without loss of consciousness, initial encounter: Secondary | ICD-10-CM

## 2020-07-07 LAB — GLUCOSE, CAPILLARY
Glucose-Capillary: 132 mg/dL — ABNORMAL HIGH (ref 70–99)
Glucose-Capillary: 150 mg/dL — ABNORMAL HIGH (ref 70–99)

## 2020-07-07 NOTE — Progress Notes (Signed)
Patient discharged to home with daughter. Items packed by staff and family members. Daughter and patient stated that they understood all discharge orders.  Sanda Linger, LPN

## 2020-07-07 NOTE — Plan of Care (Signed)
  Problem: Consults Goal: RH BRAIN INJURY PATIENT EDUCATION Description: Description: See Patient Education module for eduction specifics Outcome: Completed/Met   Problem: RH BOWEL ELIMINATION Goal: RH STG MANAGE BOWEL W/MEDICATION W/ASSISTANCE Description: STG Manage Bowel with Medication with Mod I Assistance. Outcome: Completed/Met   Problem: RH BLADDER ELIMINATION Goal: RH STG MANAGE BLADDER WITH EQUIPMENT WITH ASSISTANCE Description: STG Manage Bladder With Equipment With Mod I Assistance Outcome: Completed/Met   Problem: RH SKIN INTEGRITY Goal: RH STG SKIN FREE OF INFECTION/BREAKDOWN Description: Mod I  Outcome: Completed/Met Goal: RH STG ABLE TO PERFORM INCISION/WOUND CARE W/ASSISTANCE Description: STG Able To Perform Incision/Wound Care With Mod I Assistance. Outcome: Completed/Met   Problem: RH SAFETY Goal: RH STG ADHERE TO SAFETY PRECAUTIONS W/ASSISTANCE/DEVICE Description: STG Adhere to Safety Precautions With Mod I Assistance/Device. Outcome: Completed/Met Goal: RH STG DECREASED RISK OF FALL WITH ASSISTANCE Description: STG Decreased Risk of Fall With Mod I Assistance. Outcome: Completed/Met   Problem: RH PAIN MANAGEMENT Goal: RH STG PAIN MANAGED AT OR BELOW PT'S PAIN GOAL Description: <2 Outcome: Completed/Met

## 2020-07-07 NOTE — Progress Notes (Signed)
[place] PHYSICAL MEDICINE & REHABILITATION PROGRESS NOTE   Subjective/Complaints: Patient seen sitting up in bed this AM.  He states he slept well overnight.  He is ready for discharge.  He refused IVF yesterday, explained rationale, however, pt still refused.  He has questions regarding discharge meds.   ROS: Denies CP, SOB, N/V/D  Objective:   No results found. Recent Labs    07/06/20 0435  WBC 9.3  HGB 12.5*  HCT 39.5  PLT 224   Recent Labs    07/06/20 0435  NA 133*  K 4.3  CL 103  CO2 21*  GLUCOSE 158*  BUN 32*  CREATININE 2.10*  CALCIUM 8.6*    Intake/Output Summary (Last 24 hours) at 07/07/2020 1101 Last data filed at 07/07/2020 0745 Gross per 24 hour  Intake 1280 ml  Output --  Net 1280 ml     Pressure Injury 06/25/20 Sacrum Mid Stage 1 -  Intact skin with non-blanchable redness of a localized area usually over a bony prominence. area to mid-sacrum (Active)  06/25/20 1530  Location: Sacrum  Location Orientation: Mid  Staging: Stage 1 -  Intact skin with non-blanchable redness of a localized area usually over a bony prominence.  Wound Description (Comments): area to mid-sacrum  Present on Admission: Yes     Pressure Injury 06/25/20 Heel Right;Left;Posterior Stage 1 -  Intact skin with non-blanchable redness of a localized area usually over a bony prominence. Right and left heel extremely slow to blanch. Will place foam dressing to both. (Active)  06/25/20 1515  Location: Heel  Location Orientation: Right;Left;Posterior  Staging: Stage 1 -  Intact skin with non-blanchable redness of a localized area usually over a bony prominence.  Wound Description (Comments): Right and left heel extremely slow to blanch. Will place foam dressing to both.  Present on Admission: Yes    Physical Exam: Vital Signs Blood pressure (!) 106/53, pulse 64, temperature 98 F (36.7 C), temperature source Oral, resp. rate 18, height 5\' 11"  (1.803 m), weight 116.1 kg, SpO2 95  %. Constitutional: No distress . Vital signs reviewed. HENT: Cranial incision.  Traumatic. Eyes: EOMI. No discharge. Cardiovascular: No JVD.  RRR. Respiratory: Normal effort.  No stridor.  Bilateral clear to auscultation. GI: Non-distended.  BS +. Skin: Warm and dry.  Intact. Vascular changes bilateral lower extremities. Skin tears. Psych: Normal mood.  Normal behavior. Musc: Arthritic changes > right hand. Lower extreme edema. Neuro: Alert Motor: B/l UE: 4/5 proximal to distal, unchanged B/l LE: 4--4/5 HF, KE, 3+/5 ADF, improving  Assessment/Plan: 1. Functional deficits which require 3+ hours per day of interdisciplinary therapy in a comprehensive inpatient rehab setting.  Physiatrist is providing close team supervision and 24 hour management of active medical problems listed below.  Physiatrist and rehab team continue to assess barriers to discharge/monitor patient progress toward functional and medical goals  Care Tool:  Bathing    Body parts bathed by patient: Right arm, Left arm, Chest, Abdomen, Face, Left upper leg, Right upper leg, Right lower leg, Left lower leg, Buttocks, Front perineal area   Body parts bathed by helper: Front perineal area, Buttocks     Bathing assist Assist Level: Supervision/Verbal cueing     Upper Body Dressing/Undressing Upper body dressing   What is the patient wearing?: Pull over shirt    Upper body assist Assist Level: Supervision/Verbal cueing    Lower Body Dressing/Undressing Lower body dressing      What is the patient wearing?: Pants  Lower body assist Assist for lower body dressing: Supervision/Verbal cueing     Toileting Toileting    Toileting assist Assist for toileting: Supervision/Verbal cueing     Transfers Chair/bed transfer  Transfers assist     Chair/bed transfer assist level: Supervision/Verbal cueing     Locomotion Ambulation   Ambulation assist      Assist level: Supervision/Verbal  cueing Assistive device: Rollator Max distance: 200'   Walk 10 feet activity   Assist     Assist level: Supervision/Verbal cueing Assistive device: Rollator   Walk 50 feet activity   Assist    Assist level: Supervision/Verbal cueing Assistive device: Rollator    Walk 150 feet activity   Assist Walk 150 feet activity did not occur: Safety/medical concerns (fatigue)  Assist level: Supervision/Verbal cueing Assistive device: Rollator    Walk 10 feet on uneven surface  activity   Assist Walk 10 feet on uneven surfaces activity did not occur: Safety/medical concerns   Assist level: Supervision/Verbal cueing     Wheelchair     Assist Will patient use wheelchair at discharge?: No Type of Wheelchair: Manual    Wheelchair assist level: Supervision/Verbal cueing Max wheelchair distance: 30'    Wheelchair 50 feet with 2 turns activity    Assist            Wheelchair 150 feet activity     Assist          Blood pressure (!) 106/53, pulse 64, temperature 98 F (36.7 C), temperature source Oral, resp. rate 18, height 5\' 11"  (1.803 m), weight 116.1 kg, SpO2 95 %.  Medical Problem List and Plan: 1.  Decreased functional mobility with generalized weakness as well as reported multiple falls secondary to traumatic right side subdural hematoma.  Status post craniotomy hematoma evacuation 06/19/2020  DC today  Will see patient for transitional care management in 1-2 weeks post-discharge 2.  Antithrombotics: -DVT/anticoagulation: SCDs.             -antiplatelet therapy: N/A 3. Pain Management: Hydrocodone as needed.   Controlled on 11/27 4. Mood: Provide emotional support             -antipsychotic agents: N/A 5. Neuropsych: This patient is capable of making decisions on his own behalf. Appreciate neuropsych eval.  6. Skin/Wound Care:   -Gerhardt Butt cream to buttocks scrotum perineal area after cleansing and drying  -Stasis dermatitis Bilateral  pretibial- pt declines interventions--areas stable 7. Fluids/Electrolytes/Nutrition: Routine in and outs. 8.  Seizure prophylaxis.  Keppra 500 mg twice daily, decreased to 25 BID on 11/25, discharge on this and DC as outpatient 9.  Gram-negative rod UTI.  Rocephin started 06/20/2020 and stopped 11/17 after completed course.  10.  History of hypertension.  Patient on Lotensin 20 mg daily and HCTZ 25 mg daily prior to admission.  (Remain on hold) Vitals:   07/06/20 1933 07/07/20 0345  BP: 114/65 (!) 106/53  Pulse: 66 64  Resp: 18 18  Temp: 98.1 F (36.7 C) 98 F (36.7 C)  SpO2: 97% 95%   Controlled on 11/27 11.  Diet-controlled diabetes mellitus.  Hemoglobin A1c 6.6.  SSI. RD education requested.   Labile on 11/27, monitor in ambulatory setting with potential further adjustments. 12. Constipation: having daily BM.  13. Hyponatremia  Na+ 133 on 11/26 14. AKI vs CKD  Cr 2.1 on 11/26  Echo reviewed, EF 55-60%  500cc bolus ordered, however patient refused-attempted to educate, however patient continues to refuse  LOS: 12 days A  FACE TO FACE EVALUATION WAS PERFORMED  Kai Calico Lorie Phenix 07/07/2020, 11:01 AM

## 2020-07-09 NOTE — Progress Notes (Signed)
Inpatient Rehabilitation Care Coordinator  Discharge Note  The overall goal for the admission was met for:   Discharge location: Yes. D/c to home with support from his daughter and granddaughter who will rotate shifts to provider 24-7 care.     Length of Stay: Yes. 11 days.  Discharge activity level: Yes. Supervision.  Home/community participation: Yes. Limited.   Services provided included: MD, RD, PT, OT, RN, CM, TR, Pharmacy, Neuropsych and SW  Financial Services: Other: Medicare Part A only  Follow-up services arranged: Home Health: [facility] for HHPT/OT/Aide and DME: [facility]- pt family to look for item; if unable to find will call [employer] [facility] to get item delivered to home  Comments (or additional information): contact pt [phone] or pt dtr [name] [phone]  Patient/Family verbalized understanding of follow-up arrangements: Yes  Individual responsible for coordination of the follow-up plan: Pt to have assistance with coordinating his care needs.   Confirmed correct DME delivered: Rana Snare 07/09/2020    Rana Snare

## 2020-08-13 ENCOUNTER — Encounter: Payer: Self-pay | Admitting: Physical Medicine and Rehabilitation

## 2020-09-11 ENCOUNTER — Other Ambulatory Visit: Payer: Self-pay

## 2020-09-11 ENCOUNTER — Emergency Department (HOSPITAL_COMMUNITY): Payer: Medicare Other

## 2020-09-11 ENCOUNTER — Encounter (HOSPITAL_COMMUNITY): Payer: Self-pay

## 2020-09-11 ENCOUNTER — Inpatient Hospital Stay (HOSPITAL_COMMUNITY)
Admission: EM | Admit: 2020-09-11 | Discharge: 2020-09-29 | DRG: 266 | Disposition: A | Discharge status: Skilled Nursing Facility | Payer: Medicare Other | Attending: Cardiovascular Disease | Admitting: Cardiovascular Disease

## 2020-09-11 DIAGNOSIS — S80822A Blister (nonthermal), left lower leg, initial encounter: Secondary | ICD-10-CM | POA: Diagnosis present

## 2020-09-11 DIAGNOSIS — N3000 Acute cystitis without hematuria: Secondary | ICD-10-CM | POA: Diagnosis present

## 2020-09-11 DIAGNOSIS — I872 Venous insufficiency (chronic) (peripheral): Secondary | ICD-10-CM | POA: Diagnosis present

## 2020-09-11 DIAGNOSIS — I5082 Biventricular heart failure: Secondary | ICD-10-CM | POA: Diagnosis present

## 2020-09-11 DIAGNOSIS — I350 Nonrheumatic aortic (valve) stenosis: Secondary | ICD-10-CM

## 2020-09-11 DIAGNOSIS — I440 Atrioventricular block, first degree: Secondary | ICD-10-CM | POA: Diagnosis present

## 2020-09-11 DIAGNOSIS — I4510 Unspecified right bundle-branch block: Secondary | ICD-10-CM | POA: Diagnosis present

## 2020-09-11 DIAGNOSIS — L03314 Cellulitis of groin: Secondary | ICD-10-CM | POA: Diagnosis present

## 2020-09-11 DIAGNOSIS — R569 Unspecified convulsions: Secondary | ICD-10-CM | POA: Diagnosis present

## 2020-09-11 DIAGNOSIS — Z8249 Family history of ischemic heart disease and other diseases of the circulatory system: Secondary | ICD-10-CM

## 2020-09-11 DIAGNOSIS — N179 Acute kidney failure, unspecified: Secondary | ICD-10-CM | POA: Diagnosis present

## 2020-09-11 DIAGNOSIS — Z01810 Encounter for preprocedural cardiovascular examination: Secondary | ICD-10-CM

## 2020-09-11 DIAGNOSIS — N2889 Other specified disorders of kidney and ureter: Secondary | ICD-10-CM | POA: Diagnosis present

## 2020-09-11 DIAGNOSIS — E611 Iron deficiency: Secondary | ICD-10-CM | POA: Diagnosis present

## 2020-09-11 DIAGNOSIS — I509 Heart failure, unspecified: Principal | ICD-10-CM

## 2020-09-11 DIAGNOSIS — B372 Candidiasis of skin and nail: Secondary | ICD-10-CM | POA: Diagnosis present

## 2020-09-11 DIAGNOSIS — I130 Hypertensive heart and chronic kidney disease with heart failure and stage 1 through stage 4 chronic kidney disease, or unspecified chronic kidney disease: Secondary | ICD-10-CM | POA: Diagnosis present

## 2020-09-11 DIAGNOSIS — Z20822 Contact with and (suspected) exposure to covid-19: Secondary | ICD-10-CM | POA: Diagnosis present

## 2020-09-11 DIAGNOSIS — N1832 Chronic kidney disease, stage 3b: Secondary | ICD-10-CM

## 2020-09-11 DIAGNOSIS — E1122 Type 2 diabetes mellitus with diabetic chronic kidney disease: Secondary | ICD-10-CM | POA: Diagnosis present

## 2020-09-11 DIAGNOSIS — Z6836 Body mass index (BMI) 36.0-36.9, adult: Secondary | ICD-10-CM

## 2020-09-11 DIAGNOSIS — E1165 Type 2 diabetes mellitus with hyperglycemia: Secondary | ICD-10-CM | POA: Diagnosis present

## 2020-09-11 DIAGNOSIS — E119 Type 2 diabetes mellitus without complications: Secondary | ICD-10-CM

## 2020-09-11 DIAGNOSIS — E669 Obesity, unspecified: Secondary | ICD-10-CM | POA: Diagnosis present

## 2020-09-11 DIAGNOSIS — R609 Edema, unspecified: Secondary | ICD-10-CM

## 2020-09-11 DIAGNOSIS — I272 Pulmonary hypertension, unspecified: Secondary | ICD-10-CM | POA: Diagnosis present

## 2020-09-11 DIAGNOSIS — I5043 Acute on chronic combined systolic (congestive) and diastolic (congestive) heart failure: Secondary | ICD-10-CM | POA: Diagnosis present

## 2020-09-11 DIAGNOSIS — R778 Other specified abnormalities of plasma proteins: Secondary | ICD-10-CM

## 2020-09-11 DIAGNOSIS — I5033 Acute on chronic diastolic (congestive) heart failure: Secondary | ICD-10-CM | POA: Diagnosis present

## 2020-09-11 DIAGNOSIS — R5381 Other malaise: Secondary | ICD-10-CM

## 2020-09-11 DIAGNOSIS — L304 Erythema intertrigo: Secondary | ICD-10-CM | POA: Diagnosis present

## 2020-09-11 DIAGNOSIS — Z952 Presence of prosthetic heart valve: Secondary | ICD-10-CM

## 2020-09-11 DIAGNOSIS — Z01818 Encounter for other preprocedural examination: Secondary | ICD-10-CM

## 2020-09-11 DIAGNOSIS — Z006 Encounter for examination for normal comparison and control in clinical research program: Secondary | ICD-10-CM

## 2020-09-11 DIAGNOSIS — Z79899 Other long term (current) drug therapy: Secondary | ICD-10-CM

## 2020-09-11 DIAGNOSIS — I10 Essential (primary) hypertension: Secondary | ICD-10-CM | POA: Diagnosis present

## 2020-09-11 DIAGNOSIS — I2510 Atherosclerotic heart disease of native coronary artery without angina pectoris: Secondary | ICD-10-CM | POA: Diagnosis present

## 2020-09-11 DIAGNOSIS — Z8679 Personal history of other diseases of the circulatory system: Secondary | ICD-10-CM

## 2020-09-11 HISTORY — DX: Malignant neoplasm of unspecified kidney, except renal pelvis: C64.9

## 2020-09-11 HISTORY — DX: Heart failure, unspecified: I50.9

## 2020-09-11 HISTORY — DX: Chronic kidney disease, stage 3 unspecified: N18.30

## 2020-09-11 HISTORY — DX: Unspecified right bundle-branch block: I45.10

## 2020-09-11 HISTORY — DX: Nonrheumatic aortic (valve) stenosis: I35.0

## 2020-09-11 HISTORY — DX: Morbid (severe) obesity due to excess calories: E66.01

## 2020-09-11 HISTORY — DX: Chronic combined systolic (congestive) and diastolic (congestive) heart failure: I50.42

## 2020-09-11 LAB — COMPREHENSIVE METABOLIC PANEL
ALT: 26 U/L (ref 0–44)
AST: 22 U/L (ref 15–41)
Albumin: 3.6 g/dL (ref 3.5–5.0)
Alkaline Phosphatase: 76 U/L (ref 38–126)
Anion gap: 7 (ref 5–15)
BUN: 44 mg/dL — ABNORMAL HIGH (ref 8–23)
CO2: 21 mmol/L — ABNORMAL LOW (ref 22–32)
Calcium: 8.7 mg/dL — ABNORMAL LOW (ref 8.9–10.3)
Chloride: 104 mmol/L (ref 98–111)
Creatinine, Ser: 1.67 mg/dL — ABNORMAL HIGH (ref 0.61–1.24)
GFR, Estimated: 41 mL/min — ABNORMAL LOW (ref >60)
Glucose, Bld: 117 mg/dL — ABNORMAL HIGH (ref 70–99)
Potassium: 4.7 mmol/L (ref 3.5–5.1)
Sodium: 132 mmol/L — ABNORMAL LOW (ref 135–145)
Total Bilirubin: 2.3 mg/dL — ABNORMAL HIGH (ref 0.3–1.2)
Total Protein: 7.7 g/dL (ref 6.5–8.1)

## 2020-09-11 LAB — CBC WITH DIFFERENTIAL/PLATELET
Abs Immature Granulocytes: 0.09 10*3/uL — ABNORMAL HIGH (ref 0.00–0.07)
Basophils Absolute: 0.1 10*3/uL (ref 0.0–0.1)
Basophils Relative: 1 %
Eosinophils Absolute: 0.2 10*3/uL (ref 0.0–0.5)
Eosinophils Relative: 1 %
HCT: 47.2 % (ref 39.0–52.0)
Hemoglobin: 14.2 g/dL (ref 13.0–17.0)
Immature Granulocytes: 1 %
Lymphocytes Relative: 10 %
Lymphs Abs: 1.3 10*3/uL (ref 0.7–4.0)
MCH: 24.4 pg — ABNORMAL LOW (ref 26.0–34.0)
MCHC: 30.1 g/dL (ref 30.0–36.0)
MCV: 81.2 fL (ref 80.0–100.0)
Monocytes Absolute: 1.7 10*3/uL — ABNORMAL HIGH (ref 0.1–1.0)
Monocytes Relative: 13 %
Neutro Abs: 9.8 10*3/uL — ABNORMAL HIGH (ref 1.7–7.7)
Neutrophils Relative %: 74 %
Platelets: 214 10*3/uL (ref 150–400)
RBC: 5.81 MIL/uL (ref 4.22–5.81)
RDW: 15.7 % — ABNORMAL HIGH (ref 11.5–15.5)
WBC: 13.2 10*3/uL — ABNORMAL HIGH (ref 4.0–10.5)
nRBC: 0 % (ref 0.0–0.2)

## 2020-09-11 LAB — URINALYSIS, ROUTINE W REFLEX MICROSCOPIC
Bilirubin Urine: NEGATIVE
Glucose, UA: NEGATIVE mg/dL
Ketones, ur: NEGATIVE mg/dL
Nitrite: NEGATIVE
Protein, ur: 30 mg/dL — AB
Specific Gravity, Urine: 1.013 (ref 1.005–1.030)
WBC, UA: >50 WBC/hpf — ABNORMAL HIGH (ref 0–5)
pH: 6 (ref 5.0–8.0)

## 2020-09-11 LAB — SARS CORONAVIRUS 2 BY RT PCR (HOSPITAL ORDER, PERFORMED IN ~~LOC~~ HOSPITAL LAB): SARS Coronavirus 2: NEGATIVE

## 2020-09-11 LAB — PROTIME-INR
INR: 1.2 (ref 0.8–1.2)
Prothrombin Time: 14.9 seconds (ref 11.4–15.2)

## 2020-09-11 LAB — TROPONIN I (HIGH SENSITIVITY)
Troponin I (High Sensitivity): 207 ng/L (ref <18)
Troponin I (High Sensitivity): 211 ng/L (ref <18)

## 2020-09-11 LAB — BRAIN NATRIURETIC PEPTIDE: B Natriuretic Peptide: 1413 pg/mL — ABNORMAL HIGH (ref 0.0–100.0)

## 2020-09-11 LAB — CBG MONITORING, ED: Glucose-Capillary: 127 mg/dL — ABNORMAL HIGH (ref 70–99)

## 2020-09-11 LAB — TSH: TSH: 1.74 u[IU]/mL (ref 0.350–4.500)

## 2020-09-11 LAB — LACTIC ACID, PLASMA
Lactic Acid, Venous: 1.7 mmol/L (ref 0.5–1.9)
Lactic Acid, Venous: 2.2 mmol/L (ref 0.5–1.9)

## 2020-09-11 MED ORDER — NYSTATIN 100000 UNIT/GM EX POWD
Freq: Three times a day (TID) | CUTANEOUS | Status: DC
  Administered 2020-09-15: 1 via TOPICAL
  Filled 2020-09-11 (×4): qty 15

## 2020-09-11 MED ORDER — SODIUM CHLORIDE 0.9% FLUSH
3.0000 mL | Freq: Two times a day (BID) | INTRAVENOUS | Status: DC
  Administered 2020-09-11 – 2020-09-24 (×20): 3 mL via INTRAVENOUS

## 2020-09-11 MED ORDER — FUROSEMIDE 10 MG/ML IJ SOLN
40.0000 mg | Freq: Two times a day (BID) | INTRAMUSCULAR | Status: DC
  Administered 2020-09-12: 40 mg via INTRAVENOUS
  Filled 2020-09-11: qty 4

## 2020-09-11 MED ORDER — SODIUM CHLORIDE 0.9 % IV SOLN
INTRAVENOUS | Status: DC | PRN
  Administered 2020-09-11 – 2020-09-12 (×2): 250 mL via INTRAVENOUS

## 2020-09-11 MED ORDER — FUROSEMIDE 10 MG/ML IJ SOLN
40.0000 mg | Freq: Once | INTRAMUSCULAR | Status: AC
  Administered 2020-09-11: 40 mg via INTRAVENOUS
  Filled 2020-09-11: qty 4

## 2020-09-11 MED ORDER — SODIUM CHLORIDE 0.9 % IV SOLN
1.0000 g | INTRAVENOUS | Status: DC
  Administered 2020-09-11: 1 g via INTRAVENOUS
  Filled 2020-09-11: qty 10

## 2020-09-11 MED ORDER — INSULIN ASPART 100 UNIT/ML ~~LOC~~ SOLN
0.0000 [IU] | Freq: Three times a day (TID) | SUBCUTANEOUS | Status: DC
  Administered 2020-09-12: 18:00:00 2 [IU] via SUBCUTANEOUS
  Administered 2020-09-12: 3 [IU] via SUBCUTANEOUS
  Administered 2020-09-12 – 2020-09-14 (×4): 2 [IU] via SUBCUTANEOUS
  Administered 2020-09-14: 07:00:00 3 [IU] via SUBCUTANEOUS
  Administered 2020-09-16: 07:00:00 2 [IU] via SUBCUTANEOUS
  Administered 2020-09-16: 16:00:00 3 [IU] via SUBCUTANEOUS
  Administered 2020-09-18 – 2020-09-19 (×2): 2 [IU] via SUBCUTANEOUS
  Administered 2020-09-23 – 2020-09-26 (×3): 5 [IU] via SUBCUTANEOUS
  Administered 2020-09-26 (×2): 2 [IU] via SUBCUTANEOUS
  Administered 2020-09-27: 3 [IU] via SUBCUTANEOUS
  Administered 2020-09-27 – 2020-09-29 (×5): 2 [IU] via SUBCUTANEOUS
  Administered 2020-09-29: 3 [IU] via SUBCUTANEOUS
  Filled 2020-09-11: qty 0.15

## 2020-09-11 MED ORDER — INSULIN ASPART 100 UNIT/ML ~~LOC~~ SOLN
0.0000 [IU] | Freq: Every day | SUBCUTANEOUS | Status: DC
  Filled 2020-09-11: qty 0.05

## 2020-09-11 MED ORDER — PIPERACILLIN-TAZOBACTAM 3.375 G IVPB
3.3750 g | Freq: Once | INTRAVENOUS | Status: AC
  Administered 2020-09-11: 3.375 g via INTRAVENOUS
  Filled 2020-09-11: qty 50

## 2020-09-11 NOTE — Assessment & Plan Note (Signed)
-  Patient discharged from rehab on 07/05/2020.  He ambulates with a walker at home however has been having worsening weakness and shortness of breath, likely from CHF exacerbation -PT eval

## 2020-09-11 NOTE — Assessment & Plan Note (Signed)
-  patient has history of CKD3b. Baseline creat ~ 1.4 - 1.6, eGFR 40-45 -Creatinine 1.67 on admission and at baseline -Avoid nephrotoxic agents as able

## 2020-09-11 NOTE — Assessment & Plan Note (Addendum)
-   BNP 1413, CXR consistent with effusions and edema; on exam he is volume overloaded notably in LE's - last echo: 06/20/20, EF 55-60%, Gr II DD - patient has been on no meds since d/c from rehab on 07/05/20 - start on IV lasix 40 mg BID. Monitor strict I&O - given elevated troponin and acute overload, would benefit from inpatient cardiology evaluation if troponin continues to rise (aware this may require transfer of patient) vs outpatient cardiology followup if able to diurese well and stabilize here

## 2020-09-11 NOTE — Assessment & Plan Note (Signed)
-   UA noted with Lrg LE, >50 WBC, few bacteria; patient has inverted penis and increased frequency as well as poor hygeine - given above, will treat with Rocephin  - follow up urine culture; previously grew pan sens E coli

## 2020-09-11 NOTE — H&P (Signed)
History and Physical    Brett Williamson  MCN:[id number]  DOB: Feb 26, 1941  DOA: 09/11/2020  PCP: Neale Burly, MD Patient coming from: home  Chief Complaint: SOB  HPI:  [name] is an 80 yo male with PMH diastolic CHF, OQH4T, DMII, recent SDH (s/p right craniotomy 06/19/20), deconditioning (d/c from rehab on 07/05/20), HTN who presented to the ER with worsening shortness of breath especially with short distances of ambulating.  He denies any significant cough, fevers, chills, chest pain.  He states that he was taken off all of his medication after discharge from rehab and has not been taking any routine medications.  He also states that he no longer has required any further Keppra for seizure prophylaxis after his hospitalization for the subdural hematoma. He is accompanied by his granddaughter in the ER who helps provide further collateral information however the patient is a good historian and is able to provide HPI. He has been living alone at home since discharge from rehab and someone comes to check on him almost daily.  He has been able to take care of most of his daily routine however family does provide meals.  When asked if he has been showering or bathing, he does state that it has been hard to do so, therefore he has not been maintaining adequate hygiene.  In the ER he underwent work-up for his shortness of breath.  CXR showed bilateral pleural effusions and pulmonary edema consistent with volume overload.  He also has worsening swelling in his legs and has bulla on his left leg that have ruptured with serous fluid.  Other notable symptoms include increased urinary frequency.  He denies burning or pain but states he is voiding more often than usual.  Of note, he was also treated for a UTI in November (pansensitive E. coli).  Notable labs include: Na 132, K 4.7, Glucose 117, BUN 44, Creat 1.67 Lactic 1.7 WBC 13.2, Hgb 14.2, HCT 47.2, PLTC 214 BNP 1413 UA: neg nitrite, Lrg LE, >50  WBC, few bacteria Troponin: 211>>207  He also has extreme intertriginous moist erythema under his lower abdominal folds and his groin area.  He has edema in his bilateral lower extremities with bulla formation that have ruptured serous fluid on his left leg and erythema on both lower extremities.  He is admitted for diuresis in setting of CHF exacerbation and wound treatment as well as PT evaluation.   I have personally briefly reviewed patient's old medical records in [facility] and discussed patient with the ER provider when appropriate/indicated.  Assessment/Plan: * Acute on chronic diastolic CHF (congestive heart failure) (HCC) - BNP 1413, CXR consistent with effusions and edema; on exam he is volume overloaded notably in LE's - last echo: 06/20/20, EF 55-60%, Gr II DD - patient has been on no meds since d/c from rehab on 07/05/20 - start on IV lasix 40 mg BID. Monitor strict I&O - given elevated troponin and acute overload, would benefit from inpatient cardiology evaluation if troponin continues to rise (aware this may require transfer of patient) vs outpatient cardiology followup if able to diurese well and stabilize here  Elevated troponin - likely demand ischemia from volume overload and acute CHF - diurese as noted - trend troponin; see CHF  Chronic renal failure, stage 3b ([place]) - patient has history of CKD3b. Baseline creat ~ 1.4 - 1.6, eGFR 40-45 -Creatinine 1.67 on admission and at baseline -Avoid nephrotoxic agents as able   Chronic stasis dermatitis -Worsening lower  extremity edema due to volume overload also noted with bulla on left lower extremity weeping serous fluid -Wound nurse consulted -Continue diuresis -May still need compression  Acute cystitis - UA noted with Lrg LE, >50 WBC, few bacteria; patient has inverted penis and increased frequency as well as poor hygeine - given above, will treat with Rocephin  - follow up urine culture; previously grew  pan sens E coli  Candidiasis of skin - poor hygiene at home -[place] RN consulted - start nystatin powder for now  Physical deconditioning -Patient discharged from rehab on 07/05/2020.  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  BP controlled on admission -Continue monitoring vitals and if becomes elevated, may warrant resuming therapy.  Currently on Lasix    Code Status:    Code Status: Full Code  DVT Prophylaxis:  SCDs Start: 09/11/20 2018   Anticipated disposition is to: pending PT eval  History: Past Medical History:  Diagnosis Date  . Diabetes ([place])   . Frequent falls   . HTN (hypertension)     Past Surgical History:  Procedure Laterality Date  . CRANIOTOMY Right 06/19/2020   Procedure: CRANIOTOMY HEMATOMA EVACUATION SUBDURAL;  Surgeon: Erline Levine, MD;  Location: [place];  Service: Neurosurgery;  Laterality: Right;  . ROTATOR CUFF REPAIR       reports that he has never smoked. He has never used smokeless tobacco. He reports that he does not drink alcohol and does not use drugs.  No Known Allergies  No pertinent family history  Home Medications: Prior to Admission medications   Medication Sig Start Date End Date Taking? Authorizing Provider  acetaminophen (TYLENOL) 325 MG tablet Take 650 mg by mouth every 6 (six) hours as needed.   Yes [provider]  traMADol (ULTRAM) 50 MG tablet Take 50 mg by mouth 2 (two) times daily as  needed. 07/19/20  Yes [provider]  triamcinolone (KENALOG) 0.1 % Apply 1 application topically See admin instructions. Apply to affected area 2 to 3 times a week 07/12/20  Yes [provider]    Review of Systems:  Pertinent items noted in HPI and remainder of comprehensive ROS otherwise negative.  Physical Exam: Vitals:   09/11/20 1645 09/11/20 1700 09/11/20 1730 09/11/20 2006  BP:  130/88 125/87 108/89  Pulse: 84 89 88 94  Resp: 17 (!) 22 (!) 24 (!) 21  Temp:      TempSrc:      SpO2: 98% 99% 98% 98%  Weight:      Height:       General appearance: Pleasant elderly man resting in bed in no obvious distress Head: Normocephalic, without obvious abnormality, atraumatic Eyes: EOMI Lungs: Scattered coarse breath sounds bilaterally Heart: regular rate and rhythm and S1, S2 normal Abdomen: Obese, soft, nontender. Male genitalia: Penis inverted.  Scrotum approximately size of grapefruit with surrounding erythema.  No tenderness with manipulation of scrotum Extremities: 3+ bilateral lower extremity pitting edema Skin: Severe erythema noted under abdominal skin folds,  groin regions, and on bilateral lower extremity Neurologic: Grossly normal  Labs on Admission:  I have personally reviewed following labs and imaging studies Results for orders placed or performed during the hospital encounter of 09/11/20 (from the past 24 hour(s))  Comprehensive metabolic panel     Status: Abnormal   Collection Time: 09/11/20  3:28 PM  Result Value Ref Range   Sodium 132 (L) 135 - 145 mmol/L   Potassium 4.7 3.5 - 5.1 mmol/L   Chloride 104 98 - 111 mmol/L   CO2 21 (L) 22 - 32 mmol/L   Glucose, Bld 117 (H) 70 - 99 mg/dL   BUN 44 (H) 8 - 23 mg/dL   Creatinine, Ser 1.67 (H) 0.61 - 1.24 mg/dL   Calcium 8.7 (L) 8.9 - 10.3 mg/dL   Total Protein 7.7 6.5 - 8.1 g/dL   Albumin 3.6 3.5 - 5.0 g/dL   AST 22 15 - 41 U/L   ALT 26 0 - 44 U/L   Alkaline Phosphatase 76 38 - 126 U/L   Total  Bilirubin 2.3 (H) 0.3 - 1.2 mg/dL   GFR, Estimated 41 (L) >60 mL/min   Anion gap 7 5 - 15  Lactic acid, plasma     Status: None   Collection Time: 09/11/20  3:28 PM  Result Value Ref Range   Lactic Acid, Venous 1.7 0.5 - 1.9 mmol/L  CBC with Differential     Status: Abnormal   Collection Time: 09/11/20  3:28 PM  Result Value Ref Range   WBC 13.2 (H) 4.0 - 10.5 K/uL   RBC 5.81 4.22 - 5.81 MIL/uL   Hemoglobin 14.2 13.0 - 17.0 g/dL   HCT 47.2 39.0 - 52.0 %   MCV 81.2 80.0 - 100.0 fL   MCH 24.4 (L) 26.0 - 34.0 pg   MCHC 30.1 30.0 - 36.0 g/dL   RDW 15.7 (H) 11.5 - 15.5 %   Platelets 214 150 - 400 K/uL   nRBC 0.0 0.0 - 0.2 %   Neutrophils Relative % 74 %   Neutro Abs 9.8 (H) 1.7 - 7.7 K/uL   Lymphocytes Relative 10 %   Lymphs Abs 1.3 0.7 - 4.0 K/uL   Monocytes Relative 13 %   Monocytes Absolute 1.7 (H) 0.1 - 1.0 K/uL   Eosinophils Relative 1 %   Eosinophils Absolute 0.2 0.0 - 0.5 K/uL   Basophils Relative 1 %   Basophils Absolute 0.1 0.0 - 0.1 K/uL   Immature Granulocytes 1 %   Abs Immature Granulocytes 0.09 (H) 0.00 - 0.07 K/uL  TSH     Status: None   Collection Time: 09/11/20  3:28 PM  Result Value Ref Range   TSH 1.740 0.350 - 4.500 uIU/mL  Culture, blood (Routine x 2)     Status: None (Preliminary result)   Collection Time: 09/11/20  3:55 PM   Specimen: Blood  Result Value Ref Range   Specimen Description BLOOD LEFT ANTECUBITAL    Special Requests      BOTTLES DRAWN AEROBIC AND ANAEROBIC Blood Culture adequate volume Performed at [facility], [address], [place], [place] [address]    Culture PENDING    Report Status PENDING   Brain natriuretic peptide     Status: Abnormal   Collection Time: 09/11/20  4:00 PM  Result Value Ref Range   B Natriuretic Peptide 1,413.0 (H) 0.0 - 100.0 pg/mL  Protime-INR     Status: None   Collection Time: 09/11/20  4:00 PM  Result  Value Ref Range   Prothrombin Time 14.9 11.4 - 15.2 seconds   INR 1.2 0.8 - 1.2  Urinalysis, Routine w  reflex microscopic Urine, Clean Catch     Status: Abnormal   Collection Time: 09/11/20  5:22 PM  Result Value Ref Range   Color, Urine YELLOW YELLOW   APPearance HAZY (A) CLEAR   Specific Gravity, Urine 1.013 1.005 - 1.030   pH 6.0 5.0 - 8.0   Glucose, UA NEGATIVE NEGATIVE mg/dL   Hgb urine dipstick MODERATE (A) NEGATIVE   Bilirubin Urine NEGATIVE NEGATIVE   Ketones, ur NEGATIVE NEGATIVE mg/dL   Protein, ur 30 (A) NEGATIVE mg/dL   Nitrite NEGATIVE NEGATIVE   Leukocytes,Ua LARGE (A) NEGATIVE   RBC / HPF 6-10 0 - 5 RBC/hpf   WBC, UA >50 (H) 0 - 5 WBC/hpf   Bacteria, UA FEW (A) NONE SEEN   Squamous Epithelial / LPF 0-5 0 - 5   Amorphous Crystal PRESENT   Lactic acid, plasma     Status: Abnormal   Collection Time: 09/11/20  5:50 PM  Result Value Ref Range   Lactic Acid, Venous 2.2 (HH) 0.5 - 1.9 mmol/L  Troponin I (High Sensitivity)     Status: Abnormal   Collection Time: 09/11/20  5:50 PM  Result Value Ref Range   Troponin I (High Sensitivity) 211 (HH) <18 ng/L  Troponin I (High Sensitivity)     Status: Abnormal   Collection Time: 09/11/20  5:59 PM  Result Value Ref Range   Troponin I (High Sensitivity) 207 (HH) <18 ng/L  SARS Coronavirus 2 by RT PCR (hospital order, performed in [place] hospital lab) Nasopharyngeal Nasopharyngeal Swab     Status: None   Collection Time: 09/11/20  8:22 PM   Specimen: Nasopharyngeal Swab  Result Value Ref Range   SARS Coronavirus 2 NEGATIVE NEGATIVE     Radiological Exams on Admission: DG Chest 2 View  Result Date: 09/11/2020 CLINICAL DATA:  Sepsis EXAM: CHEST - 2 VIEW COMPARISON:  June 19, 2020 FINDINGS: There are diffuse bilateral airspace opacities. The lung volumes are low. The heart size is enlarged. There are bilateral pleural effusions, right greater than left. No pneumothorax or acute osseous abnormality. There is vascular congestion with interstitial edema. IMPRESSION: 1. Cardiomegaly with findings concerning for congestive heart  failure. 2. Low lung volumes with small bilateral pleural effusions. Electronically Signed   By: Constance Holster M.D.   On: 09/11/2020 16:38   DG Chest 2 View  Final Result      Consults called:  n/a   EKG: Independently reviewed. NSR, RBBB, QTc 526   Dwyane Dee, MD [facility] 09/11/2020, 10:16 PM

## 2020-09-11 NOTE — Assessment & Plan Note (Signed)
-  On no medications at home.  BP controlled on admission -Continue monitoring vitals and if becomes elevated, may warrant resuming therapy.  Currently on Lasix

## 2020-09-11 NOTE — ED Notes (Signed)
CRITICAL VALUE ALERT  Critical Value:  2.2 Lactic Acid  Date & Time Notied:  09/11/2020 1833  Provider Notified: Alfonzo Feller, PA  Orders Received/Actions taken: see chart

## 2020-09-11 NOTE — ED Notes (Signed)
CRITICAL VALUE ALERT  Critical Value:  Troponin 211  Date & Time Notied:  09/11/20 1848  Provider Notified: Evalee Jefferson, PA  Orders Received/Actions taken: EDP notified

## 2020-09-11 NOTE — Hospital Course (Signed)
[name] is an 80 yo male with PMH diastolic CHF, GYJ8H, DMII, recent SDH (s/p right craniotomy 06/19/20), deconditioning (d/c from rehab on 07/05/20), HTN who presented to the ER with worsening shortness of breath especially with short distances of ambulating.  He denies any significant cough, fevers, chills, chest pain.  He states that he was taken off all of his medication after discharge from rehab and has not been taking any routine medications.  He also states that he no longer has required any further Keppra for seizure prophylaxis after his hospitalization for the subdural hematoma. He is accompanied by his granddaughter in the ER who helps provide further collateral information however the patient is a good historian and is able to provide HPI. He has been living alone at home since discharge from rehab and someone comes to check on him almost daily.  He has been able to take care of most of his daily routine however family does provide meals.  When asked if he has been showering or bathing, he does state that it has been hard to do so, therefore he has not been maintaining adequate hygiene.  In the ER he underwent work-up for his shortness of breath.  CXR showed bilateral pleural effusions and pulmonary edema consistent with volume overload.  He also has worsening swelling in his legs and has bulla on his left leg that have ruptured with serous fluid.  Other notable symptoms include increased urinary frequency.  He denies burning or pain but states he is voiding more often than usual.  Of note, he was also treated for a UTI in November (pansensitive E. coli).  Notable labs include: Na 132, K 4.7, Glucose 117, BUN 44, Creat 1.67 Lactic 1.7 WBC 13.2, Hgb 14.2, HCT 47.2, PLTC 214 BNP 1413 UA: neg nitrite, Lrg LE, >50 WBC, few bacteria Troponin: 211>>207  He also has extreme intertriginous moist erythema under his lower abdominal folds and his groin area.  He has edema in his bilateral lower  extremities with bulla formation that have ruptured serous fluid on his left leg and erythema on both lower extremities.  He is admitted for diuresis in setting of CHF exacerbation and wound treatment as well as PT evaluation.

## 2020-09-11 NOTE — ED Triage Notes (Signed)
EMS reports pt had surgery to removed blood clot in brain in Nov.  Reports hasn't seen a doctor since the surgery.  Reports was taken off all of his medications and they were never restarted.  Reports pitting edema to feet and legs.  EMS reports pt has large blisters to both legs.  ALso reports frequent urination.   BP 127/87, 97% on room air cbg 154.  HR 94.  PT says has family but lives alone.  Pt alert and oriented.

## 2020-09-11 NOTE — Assessment & Plan Note (Signed)
-  Last A1c 6.6% on 06/19/2020 -Continue carb consistent diet -Continue SSI and CBG monitoring

## 2020-09-11 NOTE — Assessment & Plan Note (Signed)
-   likely demand ischemia from volume overload and acute CHF - diurese as noted - trend troponin; see CHF

## 2020-09-11 NOTE — Assessment & Plan Note (Signed)
-  Worsening lower extremity edema due to volume overload also noted with bulla on left lower extremity weeping serous fluid -Wound nurse consulted -Continue diuresis -May still need compression

## 2020-09-11 NOTE — Assessment & Plan Note (Signed)
-   poor hygiene at home -[place] RN consulted - start nystatin powder for now

## 2020-09-11 NOTE — Assessment & Plan Note (Addendum)
-   s/p right craniotomy on 06/19/2020 -Discharged from rehab on 07/05/2020 -SCD for DVT prophylaxis for now - patient says he is no longer on Keppra for sz ppx

## 2020-09-11 NOTE — ED Provider Notes (Signed)
[facility] EMERGENCY DEPARTMENT Provider Note   CSN: [id number] Arrival date & time: 09/11/20  1453     History No chief complaint on file.   Brett Williamson is a 80 y.o. male with a history of diet-controlled diabetes, hypertension, seizure disorder, hypertension and surgical intervention for a subdural hematoma November 2021, spent some time in rehab, but has been home for the past 6 weeks with daughter caring for him until recently, presenting for an approximate 2-week history of generalized weakness and increasing shortness of breath with exertion.  He denies chest pain, also no fevers or chills but does endorse short of breath with exertion.  He does ambulate by himself using a walker.  He has noticed increased swelling in his bilateral legs along with a 2-day history of redness and multiple blisters on his anterior lower legs. He states he was taken off his blood pressure medicines with his recent hospitalization which included hctz and possible benazepril (pt unsure of this name).   He denies nausea or vomiting, abdominal pain, also no complaint of significant cough.  Denies headache, denies headache or increased focal weakness.  HPI     Past Medical History:  Diagnosis Date  . Diabetes ([place])   . Frequent falls   . HTN (hypertension)     Patient Active Problem List   Diagnosis Date Noted  . AKI (acute kidney injury) ([place])   . History of hypertension   . Seizure prophylaxis   . Controlled type 2 diabetes mellitus with hyperglycemia, without long-term current use of insulin ([place])   . Traumatic subdural hematoma ([place]) 06/25/2020  . Pressure injury of skin 06/21/2020  . Subdural hematoma ([place]) 06/19/2020  . Generalized weakness 06/19/2020  . Hyponatremia 06/19/2020  . Essential hypertension 06/19/2020  . Subdural bleeding ([place]) 06/19/2020  . Urinary tract infection in male     Past Surgical History:  Procedure Laterality Date  . CRANIOTOMY Right 06/19/2020   Procedure:  CRANIOTOMY HEMATOMA EVACUATION SUBDURAL;  Surgeon: Erline Levine, MD;  Location: [facility];  Service: Neurosurgery;  Laterality: Right;  . ROTATOR CUFF REPAIR         No family history on file.  Social History   Tobacco Use  . Smoking status: Never Smoker  . Smokeless tobacco: Never Used  Vaping Use  . Vaping Use: Never used  Substance Use Topics  . Alcohol use: Never  . Drug use: Never    Home Medications Prior to Admission medications   Medication Sig Start Date End Date Taking? Authorizing Provider  acetaminophen (TYLENOL) 325 MG tablet Take 650 mg by mouth every 6 (six) hours as needed.   Yes [provider]  traMADol (ULTRAM) 50 MG tablet Take 50 mg by mouth 2 (two) times daily as needed. 07/19/20  Yes [provider]  triamcinolone (KENALOG) 0.1 % Apply 1 application topically See admin instructions. Apply to affected area 2 to 3 times a week 07/12/20  Yes [provider]  docusate sodium (COLACE) 100 MG capsule Take 1 capsule (100 mg total) by mouth 2 (two) times daily as needed for mild constipation. Patient not taking: No sig reported 07/06/20   Love, Ivan Anchors, PA-C  levETIRAcetam (KEPPRA) 250 MG tablet Take 1 tablet (250 mg total) by mouth 2 (two) times daily. Patient not taking: No sig reported 07/06/20   Love, Ivan Anchors, PA-C  pantoprazole (PROTONIX) 40 MG tablet Take 1 tablet (40 mg total) by mouth at bedtime. Patient not taking: No sig reported 07/06/20  Bary Leriche, PA-C    Allergies    Patient has no known allergies.  Review of Systems   Review of Systems  Constitutional: Negative for chills and fever.  HENT: Negative for congestion and sore throat.   Eyes: Negative.   Respiratory: Positive for shortness of breath. Negative for chest tightness.   Cardiovascular: Positive for leg swelling. Negative for chest pain.  Gastrointestinal: Negative for abdominal pain and nausea.  Genitourinary: Negative.   Musculoskeletal: Negative for  arthralgias, joint swelling and neck pain.  Skin: Positive for color change and wound. Negative for rash.  Neurological: Negative for dizziness, weakness, light-headedness, numbness and headaches.  Psychiatric/Behavioral: Negative.     Physical Exam Updated Vital Signs BP 125/87   Pulse 88   Temp 98.1 F (36.7 C) (Oral)   Resp (!) 24   Ht 5\' 11"  (1.803 m)   Wt 117 kg   SpO2 98%   BMI 35.98 kg/m   Physical Exam Vitals and nursing note reviewed.  Constitutional:      Appearance: He is well-developed and well-nourished. He is obese.  HENT:     Head: Normocephalic and atraumatic.     Mouth/Throat:     Pharynx: Oropharynx is clear.  Eyes:     Conjunctiva/sclera: Conjunctivae normal.  Cardiovascular:     Rate and Rhythm: Normal rate and regular rhythm.     Pulses: Intact distal pulses.     Heart sounds: Murmur heard.   Systolic murmur is present.   Pulmonary:     Effort: Pulmonary effort is normal.     Breath sounds: Rales present. No wheezing or rhonchi.     Comments: Rales best appreciated left base. Abdominal:     General: Abdomen is protuberant. Bowel sounds are normal.     Palpations: Abdomen is soft.     Tenderness: There is no abdominal tenderness.  Musculoskeletal:        General: Normal range of motion.     Cervical back: Normal range of motion.  Skin:    General: Skin is warm and dry.     Comments: Patchy erythema bilateral pre tibial regions with large burst bulla left anterior tibia.  Pitting edema, multiple old appearing scabs on most of his toes.  Groin and perineum severe erythematous with white watery exudate within the skin folds. No satellite lesions (pt denies itching).   Neurological:     Mental Status: He is alert.  Psychiatric:        Mood and Affect: Mood and affect normal.     ED Results / Procedures / Treatments   Labs (all labs ordered are listed, but only abnormal results are displayed) Labs Reviewed  COMPREHENSIVE METABOLIC PANEL -  Abnormal; Notable for the following components:      Result Value   Sodium 132 (*)    CO2 21 (*)    Glucose, Bld 117 (*)    BUN 44 (*)    Creatinine, Ser 1.67 (*)    Calcium 8.7 (*)    Total Bilirubin 2.3 (*)    GFR, Estimated 41 (*)    All other components within normal limits  LACTIC ACID, PLASMA - Abnormal; Notable for the following components:   Lactic Acid, Venous 2.2 (*)    All other components within normal limits  CBC WITH DIFFERENTIAL/PLATELET - Abnormal; Notable for the following components:   WBC 13.2 (*)    MCH 24.4 (*)    RDW 15.7 (*)    Neutro Abs 9.8 (*)  Monocytes Absolute 1.7 (*)    Abs Immature Granulocytes 0.09 (*)    All other components within normal limits  URINALYSIS, ROUTINE W REFLEX MICROSCOPIC - Abnormal; Notable for the following components:   APPearance HAZY (*)    Hgb urine dipstick MODERATE (*)    Protein, ur 30 (*)    Leukocytes,Ua LARGE (*)    WBC, UA >50 (*)    Bacteria, UA FEW (*)    All other components within normal limits  BRAIN NATRIURETIC PEPTIDE - Abnormal; Notable for the following components:   B Natriuretic Peptide 1,413.0 (*)    All other components within normal limits  TROPONIN I (HIGH SENSITIVITY) - Abnormal; Notable for the following components:   Troponin I (High Sensitivity) 207 (*)    All other components within normal limits  TROPONIN I (HIGH SENSITIVITY) - Abnormal; Notable for the following components:   Troponin I (High Sensitivity) 211 (*)    All other components within normal limits  CULTURE, BLOOD (ROUTINE X 2)  CULTURE, BLOOD (ROUTINE X 2)  LACTIC ACID, PLASMA  PROTIME-INR    EKG EKG Interpretation  Date/Time:  Tuesday September 11 2020 15:31:40 EST Ventricular Rate:  92 PR Interval:    QRS Duration: 159 QT Interval:  425 QTC Calculation: 526 R Axis:   168 Text Interpretation: Sinus rhythm Right bundle branch block No significant change since last tracing Confirmed by Fredia Sorrow [phone]) on  09/11/2020 3:38:54 PM   Radiology DG Chest 2 View  Result Date: 09/11/2020 CLINICAL DATA:  Sepsis EXAM: CHEST - 2 VIEW COMPARISON:  June 19, 2020 FINDINGS: There are diffuse bilateral airspace opacities. The lung volumes are low. The heart size is enlarged. There are bilateral pleural effusions, right greater than left. No pneumothorax or acute osseous abnormality. There is vascular congestion with interstitial edema. IMPRESSION: 1. Cardiomegaly with findings concerning for congestive heart failure. 2. Low lung volumes with small bilateral pleural effusions. Electronically Signed   By: Constance Holster M.D.   On: 09/11/2020 16:38    Procedures Procedures   Medications Ordered in ED Medications  piperacillin-tazobactam (ZOSYN) IVPB 3.375 g (3.375 g Intravenous New Bag/Given 09/11/20 1733)  0.9 %  sodium chloride infusion (250 mLs Intravenous New Bag/Given 09/11/20 1732)  furosemide (LASIX) injection 40 mg (40 mg Intravenous Given 09/11/20 1727)    ED Course  I have reviewed the triage vital signs and the nursing notes.  Pertinent labs & imaging results that were available during my care of the patient were reviewed by me and considered in my medical decision making (see chart for details).    MDM Rules/Calculators/A&P                          Pt with worsening sob with cxr and elevated bnp suggesting acute exacerbation of CHF. Troponins also elevated but no change in dleta troponins - pt denies chest pain.   Exam also suggesting acute on chronic groin skin infection, intertrigo with probable overlying bacterial skin infection.  Scrotum erythematous, nontender.  No perineum involvement, not fourniers.    Initial lactate normal at 1.7, second bumped at 2.2, elevation in WBC count at 13.2  Other labs including elevated creatinine and BUN - these numbers are stable in comparison to previous labs.  He was given an initial dose of zosyn given skin infection and suspected initially possible sepsis.    Pt will require admission for tx of CHF, further management of skin infection.  Discussed with Dr. Sabino Gasser who accepts pt for admission.    Final Clinical Impression(s) / ED Diagnoses Final diagnoses:  Acute congestive heart failure, unspecified heart failure type [facility])  Intertrigo  Peripheral edema    Rx / DC Orders ED Discharge Orders    None       Landis Martins 09/11/20 Paul Half, MD 09/16/20 [phone]

## 2020-09-12 ENCOUNTER — Encounter (HOSPITAL_COMMUNITY): Payer: Self-pay | Admitting: Internal Medicine

## 2020-09-12 ENCOUNTER — Inpatient Hospital Stay (HOSPITAL_COMMUNITY): Payer: Medicare Other

## 2020-09-12 DIAGNOSIS — E1165 Type 2 diabetes mellitus with hyperglycemia: Secondary | ICD-10-CM

## 2020-09-12 DIAGNOSIS — I5033 Acute on chronic diastolic (congestive) heart failure: Secondary | ICD-10-CM

## 2020-09-12 LAB — CBC WITH DIFFERENTIAL/PLATELET
Abs Immature Granulocytes: 0.06 10*3/uL (ref 0.00–0.07)
Basophils Absolute: 0.1 10*3/uL (ref 0.0–0.1)
Basophils Relative: 1 %
Eosinophils Absolute: 0.1 10*3/uL (ref 0.0–0.5)
Eosinophils Relative: 1 %
HCT: 43.4 % (ref 39.0–52.0)
Hemoglobin: 12.9 g/dL — ABNORMAL LOW (ref 13.0–17.0)
Immature Granulocytes: 1 %
Lymphocytes Relative: 12 %
Lymphs Abs: 1.2 10*3/uL (ref 0.7–4.0)
MCH: 24.1 pg — ABNORMAL LOW (ref 26.0–34.0)
MCHC: 29.7 g/dL — ABNORMAL LOW (ref 30.0–36.0)
MCV: 81 fL (ref 80.0–100.0)
Monocytes Absolute: 1.1 10*3/uL — ABNORMAL HIGH (ref 0.1–1.0)
Monocytes Relative: 11 %
Neutro Abs: 7.4 10*3/uL (ref 1.7–7.7)
Neutrophils Relative %: 74 %
Platelets: 203 10*3/uL (ref 150–400)
RBC: 5.36 MIL/uL (ref 4.22–5.81)
RDW: 15 % (ref 11.5–15.5)
WBC: 10 10*3/uL (ref 4.0–10.5)
nRBC: 0 % (ref 0.0–0.2)

## 2020-09-12 LAB — COMPREHENSIVE METABOLIC PANEL
ALT: 21 U/L (ref 0–44)
AST: 18 U/L (ref 15–41)
Albumin: 3.2 g/dL — ABNORMAL LOW (ref 3.5–5.0)
Alkaline Phosphatase: 65 U/L (ref 38–126)
Anion gap: 9 (ref 5–15)
BUN: 46 mg/dL — ABNORMAL HIGH (ref 8–23)
CO2: 21 mmol/L — ABNORMAL LOW (ref 22–32)
Calcium: 8.6 mg/dL — ABNORMAL LOW (ref 8.9–10.3)
Chloride: 104 mmol/L (ref 98–111)
Creatinine, Ser: 1.68 mg/dL — ABNORMAL HIGH (ref 0.61–1.24)
GFR, Estimated: 41 mL/min — ABNORMAL LOW (ref >60)
Glucose, Bld: 137 mg/dL — ABNORMAL HIGH (ref 70–99)
Potassium: 4.5 mmol/L (ref 3.5–5.1)
Sodium: 134 mmol/L — ABNORMAL LOW (ref 135–145)
Total Bilirubin: 2.4 mg/dL — ABNORMAL HIGH (ref 0.3–1.2)
Total Protein: 6.9 g/dL (ref 6.5–8.1)

## 2020-09-12 LAB — ECHOCARDIOGRAM COMPLETE
AR max vel: 0.27 cm2
AV Area VTI: 0.24 cm2
AV Area mean vel: 0.24 cm2
AV Mean grad: 65.7 mmHg
AV Peak grad: 95.8 mmHg
Ao pk vel: 4.89 m/s
Area-P 1/2: 3.13 cm2
Height: 71 in
MV M vel: 5.13 m/s
MV Peak grad: 105.3 mmHg
S' Lateral: 4.63 cm
Weight: 4128 oz

## 2020-09-12 LAB — TROPONIN I (HIGH SENSITIVITY)
Troponin I (High Sensitivity): 231 ng/L (ref <18)
Troponin I (High Sensitivity): 248 ng/L (ref <18)
Troponin I (High Sensitivity): 252 ng/L (ref <18)

## 2020-09-12 LAB — BRAIN NATRIURETIC PEPTIDE: B Natriuretic Peptide: 1561 pg/mL — ABNORMAL HIGH (ref 0.0–100.0)

## 2020-09-12 LAB — GLUCOSE, CAPILLARY
Glucose-Capillary: 150 mg/dL — ABNORMAL HIGH (ref 70–99)
Glucose-Capillary: 167 mg/dL — ABNORMAL HIGH (ref 70–99)

## 2020-09-12 LAB — MAGNESIUM: Magnesium: 2.1 mg/dL (ref 1.7–2.4)

## 2020-09-12 LAB — HEMOGLOBIN A1C
Hgb A1c MFr Bld: 7.1 % — ABNORMAL HIGH (ref 4.8–5.6)
Mean Plasma Glucose: 157.07 mg/dL

## 2020-09-12 LAB — HEPARIN LEVEL (UNFRACTIONATED): Heparin Unfractionated: <0.1 IU/mL — ABNORMAL LOW (ref 0.30–0.70)

## 2020-09-12 MED ORDER — FUROSEMIDE 10 MG/ML IJ SOLN
80.0000 mg | Freq: Two times a day (BID) | INTRAMUSCULAR | Status: DC
  Administered 2020-09-12: 80 mg via INTRAVENOUS
  Filled 2020-09-12: qty 8

## 2020-09-12 MED ORDER — HEPARIN (PORCINE) 25000 UT/250ML-% IV SOLN
1200.0000 [IU]/h | INTRAVENOUS | Status: DC
  Administered 2020-09-12: 1200 [IU]/h via INTRAVENOUS
  Filled 2020-09-12: qty 250

## 2020-09-12 MED ORDER — GERHARDT'S BUTT CREAM
TOPICAL_CREAM | Freq: Every day | CUTANEOUS | Status: DC
  Filled 2020-09-12: qty 1

## 2020-09-12 MED ORDER — HEPARIN BOLUS VIA INFUSION
4000.0000 [IU] | Freq: Once | INTRAVENOUS | Status: AC
  Administered 2020-09-12: 4000 [IU] via INTRAVENOUS
  Filled 2020-09-12: qty 4000

## 2020-09-12 MED ORDER — ACETAMINOPHEN 325 MG PO TABS
650.0000 mg | ORAL_TABLET | Freq: Four times a day (QID) | ORAL | Status: DC | PRN
  Administered 2020-09-12 – 2020-09-13 (×2): 650 mg via ORAL
  Filled 2020-09-12 (×2): qty 2

## 2020-09-12 MED ORDER — ASPIRIN EC 81 MG PO TBEC
81.0000 mg | DELAYED_RELEASE_TABLET | Freq: Every day | ORAL | Status: DC
  Administered 2020-09-12 – 2020-09-29 (×16): 81 mg via ORAL
  Filled 2020-09-12 (×17): qty 1

## 2020-09-12 MED ORDER — FUROSEMIDE 10 MG/ML IJ SOLN
40.0000 mg | Freq: Two times a day (BID) | INTRAMUSCULAR | Status: DC
  Administered 2020-09-13 – 2020-09-14 (×2): 40 mg via INTRAVENOUS
  Filled 2020-09-12 (×2): qty 4

## 2020-09-12 NOTE — Progress Notes (Signed)
Groin area and folds cleansed with soap/water. Dried. Interdry applied to groin folds. Scrotum and penis cleansed with soap/water. Dried. Nystatin powder to scrotum. Bilateral legs cleansed with soap/water/dried. No active drainage from bilateral shin blister areas. Xeroform gauze applied to shins. Bilateral lower legs wrapped with kerlex and coban. Tolerate well. SCD's contraindicated for blisters on lower legs.

## 2020-09-12 NOTE — Progress Notes (Signed)
*  PRELIMINARY RESULTS* Echocardiogram 2D Echocardiogram has been performed.  [name] 09/12/2020, 12:30 PM

## 2020-09-12 NOTE — Progress Notes (Signed)
Perineum moist. primofit not adhered. #14 foley catheter inserted without difficulty. Cloudy yellow urine returned. Foley balloon inflated with 10 ml of water. Foley secured to right thigh. Tolerated well.

## 2020-09-12 NOTE — Progress Notes (Signed)
Daughter at bedside. Daughter fed pt his supper tray. Pt ate 95% of food. tolerated well.

## 2020-09-12 NOTE — Progress Notes (Signed)
ANTICOAGULATION CONSULT NOTE - Initial Consult  Pharmacy Consult for heparin Indication: chest pain/ACS  No Known Allergies  Patient Measurements: Height: 5\' 11"  (180.3 cm) Weight: 117 kg (258 lb) IBW/kg (Calculated) : 75.3 Heparin Dosing Weight: 101 kg  Vital Signs: Temp: 98.2 F (36.8 C) (02/02 0745) Temp Source: Oral (02/02 0745) BP: 106/77 (02/02 1400) Pulse Rate: 90 (02/02 1445)  Labs: Recent Labs    09/11/20 1528 09/11/20 1600 09/11/20 1750 09/12/20 0027 09/12/20 0708 09/12/20 1214  HGB 14.2  --   --   --  12.9*  --   HCT 47.2  --   --   --  43.4  --   PLT 214  --   --   --  203  --   LABPROT  --  14.9  --   --   --   --   INR  --  1.2  --   --   --   --   CREATININE 1.67*  --   --   --  1.68*  --   TROPONINIHS  --   --    < > 231* 248* 252*   < > = values in this interval not displayed.    Estimated Creatinine Clearance: 45.6 mL/min (A) (by C-G formula based on SCr of 1.68 mg/dL (H)).   Medical History: Past Medical History:  Diagnosis Date  . CHF (congestive heart failure) ([place])   . Diabetes ([place])   . Frequent falls   . HTN (hypertension)     Medications:  Medications Prior to Admission  Medication Sig Dispense Refill Last Dose  . acetaminophen (TYLENOL) 325 MG tablet Take 650 mg by mouth every 6 (six) hours as needed.   Past Month at Unknown time  . traMADol (ULTRAM) 50 MG tablet Take 50 mg by mouth 2 (two) times daily as needed.   Past Week at Unknown time  . triamcinolone (KENALOG) 0.1 % Apply 1 application topically See admin instructions. Apply to affected area 2 to 3 times a week       Assessment: Pharmacy consulted to dose heparin in patient with chest pain/ACS.  Patient is not on anticoagulation prior to admission.  Troponin 252 CBC WNL  Goal of Therapy:  Heparin level 0.3-0.7 units/ml Monitor platelets by anticoagulation protocol: Yes   Plan:  Give 4000 units bolus x 1 Start heparin infusion at 1200 units/hr Check anti-Xa level  in 8 hours and daily while on heparin Continue to monitor H&H and platelets  Margot Ables, PharmD Clinical Pharmacist 09/12/2020 2:50 PM

## 2020-09-12 NOTE — Consult Note (Signed)
[facility] Nurse Consult Note: Reason for Consult:Patient presents with fluid overload and intertriginous dermatitis from incontinence and self neglect.  Lives alone with family bringing in meals and admits bathing is a challenge.  Wound type:incontinence associated dermatitis and anasarca from fluid overload with blistering to left lower leg.  Edema present in both legs  Pressure Injury POA: NA Measurement:scattered ruptured and serum filled blisters to left leg Erythema and weeping to right leg and abdominal pannus and inguinal folds.  Wound bed:red and moist Drainage (amount, consistency, odor) moderate weeping   Periwound:erythema and moisture Dressing procedure/placement/frequency:Cleanse abdominal folds with soap and water and pat dry.  Gerhardts butt paste to inguinal folds and buttocks.  Will implement moisture wicking linen (interdry Ag)  Measure and cut length of InterDry to fit in skin folds that have skin breakdown Tuck InterDry fabric into skin folds in a single layer, allow for 2 inches of overhang from skin edges to allow for wicking to occur May remove to bathe; dry area thoroughly and then tuck into affected areas again Do not apply any creams or ointments when using InterDry DO NOT THROW AWAY FOR 5 DAYS unless soiled with stool DO NOT [facility] product, this will inactivate the silver in the material  New sheet of Interdry should be applied after 5 days of use if patient continues to have skin breakdown  BEdside RN to perform:   CLeanse legs with soap and water and pat dry.  Apply Xeroform gauze to blisters on left lower leg. Moisturize intact skin to both legs  Wrap both legs with kerlix from below toes to below knee.  Secure with self adherent coban Change twice weekly.  Wednesday and Saturday.  Will not follow at this time.  Please re-consult if needed.  Domenic Moras MSN, RN, FNP-BC CWON Wound, Ostomy, Continence Nurse Pager [phone]

## 2020-09-12 NOTE — Progress Notes (Addendum)
PROGRESS NOTE    Brett Williamson  [id number] DOB: 27-Apr-1941 DOA: 09/11/2020 PCP: Neale Burly, MD    Brief Narrative:  Brett Williamson is an 80yo male with a history of HFpEF, T2DM, CKD 3b, and a history of subdural hematoma s/p craniotomy (06/19/20) who presents with worsening dyspnea on exertion, orthopnea, and BLE edema.    Assessment & Plan:   Principal Problem:   Acute on chronic diastolic CHF (congestive heart failure) (HCC) Active Problems:   History of subdural hematoma   Essential hypertension   Controlled type 2 diabetes mellitus with hyperglycemia, without long-term current use of insulin (HCC)   Elevated troponin   Physical deconditioning   Candidiasis of skin   Acute cystitis   Chronic stasis dermatitis   Chronic renal failure, stage 3b (HCC)   Acute on chronic diastolic CHF (congestive heart failure) ([place]): Elevated troponin: Severely volume overloaded on exam, most notably in LE, also with weeping edema/erythema of groin and abdominal intertriginous zones. BNP [id number]. Troponin 207>>248. S/p IV Lasix 40mg  x2, only with 437mL UOP. CXR with bilateral pleural effusion and pulmonary edema.   - last echo: 06/20/20, EF 55-60%, Gr II DD - Repeat echo today  - Consult to cardiology - Increase IV Lasix to 80mg  BID - Patient has been on no meds since d/c from rehab on 07/05/20 - Monitor strict I&O  Chronic renal failure, stage 3b ([place]) Patient with history of CKD3b. Cr stable around 1.68 which seems to be his baseline.  -Avoid nephrotoxic agents as able  Chronic stasis dermatitis Worsening lower extremity edema due to volume overload also noted with bulla on left lower extremity weeping serous fluid -WOC consulted; recommending butt paste to inguinal folks and buttocks, interdry Ag to skin folds with breakdown. Xeroform gauze to blisters of LLE, Kerlix wrap of BLE.  -Continue diuresis as above  Asymptomatic bacteruria UA noted with Lrg LE, >50 WBC, few bacteria;  patient has inverted penis. S/p Rocephin x1. However, given that he is asymptomatic and afebrile, will treat this is asymptomatic bacteruria - D/c Rocephin - Follow up urine culture; previously grew pan sens E coli - If he clinically worsens, can add back abx  Candidiasis of skin Poor hygiene at home. Likely exacerbated by fluid overload and weeping edema of intertriginous areas.  - WOC recs as above - Nystatin powder for now  Physical deconditioning Patient discharged from rehab on 07/05/2020.  He ambulates with a walker at home however has been having worsening weakness and shortness of breath, likely from CHF exacerbation. -PT eval  History of subdural hematoma  S/p right craniotomy on 06/19/2020. Discharged from rehab on 07/05/2020 -SCD for DVT prophylaxis for now -Patient says he is no longer on Keppra for sz ppx  Controlled type 2 diabetes mellitus with hyperglycemia, without long-term current use of insulin (HCC) Glucoses 127 today. Last A1c 6.6% on 06/19/2020 -Continue carb consistent diet -Continue SSI and CBG monitoring  Essential hypertension On no medications at home.  BP controlled today.  -Continue monitoring vitals and if becomes elevated, may warrant resuming therapy.  Currently on Lasix      DVT prophylaxis: SCDs Start: 09/11/20 2018  Code Status: Full Family Communication: Preferred contact is daughter Belenda Cruise. [phone] Disposition Plan: Status is: Inpatient  Remains inpatient appropriate because:Ongoing diagnostic testing needed not appropriate for outpatient work up and IV treatments appropriate due to intensity of illness or inability to take PO   Dispo: The patient is from: Home  Anticipated d/c is to: SNF              Anticipated d/c date is: > 3 days              Patient currently is not medically stable to d/c.   Difficult to place patient No         Consultants:   Cardiology  Procedures:    Echocardiogram  Antimicrobials:   D/c'd Rocephin    Subjective: Brett Williamson reports that he feels "alright" this morning. He reports a several week history of worsening LE edema, orthopnea and DOE. He has been off all medications since discharge from rehab in late November.   Objective: Vitals:   09/12/20 0100 09/12/20 0130 09/12/20 0231 09/12/20 0745  BP: 114/87 109/72  99/74  Pulse: 90 91  88  Resp: 17 (!) 21  19  Temp:   97.7 F (36.5 C) 98.2 F (36.8 C)  TempSrc:   Oral Oral  SpO2: 98% 98%  94%  Weight:      Height:        Intake/Output Summary (Last 24 hours) at 09/12/2020 0842 Last data filed at 09/12/2020 0500 Gross per 24 hour  Intake 150 ml  Output 100 ml  Net 50 ml   Filed Weights   09/11/20 1525  Weight: 117 kg    Examination:  General exam: Appears calm and comfortable  Respiratory system: Clear to auscultation. Respiratory effort normal. Cardiovascular system: S1 & S2 heard, RRR. No JVD, murmurs, rubs, gallops or clicks. No pedal edema. Gastrointestinal system: Abdomen is nondistended, soft and nontender. No organomegaly or masses felt. Normal bowel sounds heard. Central nervous system: Alert and oriented. No focal neurological deficits. Extremities: Symmetric 5 x 5 power. Skin: No rashes, lesions or ulcers Psychiatry: Judgement and insight appear normal. Mood & affect appropriate.     Data Reviewed: I have personally reviewed following labs and imaging studies  CBC: Recent Labs  Lab 09/11/20 1528 09/12/20 0708  WBC 13.2* 10.0  NEUTROABS 9.8* 7.4  HGB 14.2 12.9*  HCT 47.2 43.4  MCV 81.2 81.0  PLT 214 498   Basic Metabolic Panel: Recent Labs  Lab 09/11/20 1528 09/12/20 0708  NA 132* 134*  K 4.7 4.5  CL 104 104  CO2 21* 21*  GLUCOSE 117* 137*  BUN 44* 46*  CREATININE 1.67* 1.68*  CALCIUM 8.7* 8.6*  MG  --  2.1   GFR: Estimated Creatinine Clearance: 45.6 mL/min (A) (by C-G formula based on SCr of 1.68 mg/dL (H)). Liver Function  Tests: Recent Labs  Lab 09/11/20 1528 09/12/20 0708  AST 22 18  ALT 26 21  ALKPHOS 76 65  BILITOT 2.3* 2.4*  PROT 7.7 6.9  ALBUMIN 3.6 3.2*   No results for input(s): LIPASE, AMYLASE in the last 168 hours. No results for input(s): AMMONIA in the last 168 hours. Coagulation Profile: Recent Labs  Lab 09/11/20 1600  INR 1.2   Cardiac Enzymes: No results for input(s): CKTOTAL, CKMB, CKMBINDEX, TROPONINI in the last 168 hours. BNP (last 3 results) No results for input(s): PROBNP in the last 8760 hours. HbA1C: No results for input(s): HGBA1C in the last 72 hours. CBG: Recent Labs  Lab 09/11/20 2349  GLUCAP 127*   Lipid Profile: No results for input(s): CHOL, HDL, LDLCALC, TRIG, CHOLHDL, LDLDIRECT in the last 72 hours. Thyroid Function Tests: Recent Labs    09/11/20 1528  TSH 1.740   Anemia Panel: No results for input(s): VITAMINB12, FOLATE, FERRITIN,  TIBC, IRON, RETICCTPCT in the last 72 hours. Sepsis Labs: Recent Labs  Lab 09/11/20 1528 09/11/20 1750  LATICACIDVEN 1.7 2.2*    Recent Results (from the past 240 hour(s))  Culture, blood (Routine x 2)     Status: None (Preliminary result)   Collection Time: 09/11/20  3:28 PM   Specimen: BLOOD  Result Value Ref Range Status   Specimen Description BLOOD RIGHT ANTECUBITAL  Final   Special Requests   Final    BOTTLES DRAWN AEROBIC AND ANAEROBIC Blood Culture adequate volume   Culture   Final    NO GROWTH < 24 HOURS Performed at [facility], [address] [place], [place] [address]    Report Status PENDING  Incomplete  Culture, blood (Routine x 2)     Status: None (Preliminary result)   Collection Time: 09/11/20  3:55 PM   Specimen: BLOOD  Result Value Ref Range Status   Specimen Description BLOOD LEFT ANTECUBITAL  Final   Special Requests   Final    BOTTLES DRAWN AEROBIC AND ANAEROBIC Blood Culture adequate volume   Culture   Final    NO GROWTH < 24 HOURS Performed at [facility], [address],  [place], [place] [address]    Report Status PENDING  Incomplete  SARS Coronavirus 2 by RT PCR (hospital order, performed in [facility] hospital lab) Nasopharyngeal Nasopharyngeal Swab     Status: None   Collection Time: 09/11/20  8:22 PM   Specimen: Nasopharyngeal Swab  Result Value Ref Range Status   SARS Coronavirus 2 NEGATIVE NEGATIVE Final    Comment: (NOTE) SARS-CoV-2 target nucleic acids are NOT DETECTED.  The SARS-CoV-2 RNA is generally detectable in upper and lower respiratory specimens during the acute phase of infection. The lowest concentration of SARS-CoV-2 viral copies this assay can detect is 250 copies / mL. A negative result does not preclude SARS-CoV-2 infection and should not be used as the sole basis for treatment or other patient management decisions.  A negative result may occur with improper specimen collection / handling, submission of specimen other than nasopharyngeal swab, presence of viral mutation(s) within the areas targeted by this assay, and inadequate number of viral copies (<250 copies / mL). A negative result must be combined with clinical observations, patient history, and epidemiological information.  Fact Sheet for Patients:   StrictlyIdeas.no  Fact Sheet for Healthcare Providers: BankingDealers.co.za  This test is not yet approved or  cleared by the [place] FDA and has been authorized for detection and/or diagnosis of SARS-CoV-2 by FDA under an Emergency Use Authorization (EUA).  This EUA will remain in effect (meaning this test can be used) for the duration of the COVID-19 declaration under Section 564(b)(1) of the Act, 21 U.S.C. section 360bbb-3(b)(1), unless the authorization is terminated or revoked sooner.  Performed at [facility], [address] [place], [place] [address]          Radiology Studies: DG Chest 2 View  Result Date: 09/11/2020 CLINICAL DATA:  Sepsis EXAM: CHEST - 2  VIEW COMPARISON:  June 19, 2020 FINDINGS: There are diffuse bilateral airspace opacities. The lung volumes are low. The heart size is enlarged. There are bilateral pleural effusions, right greater than left. No pneumothorax or acute osseous abnormality. There is vascular congestion with interstitial edema. IMPRESSION: 1. Cardiomegaly with findings concerning for congestive heart failure. 2. Low lung volumes with small bilateral pleural effusions. Electronically Signed   By: Constance Holster M.D.   On: 09/11/2020 16:38  Scheduled Meds: . furosemide  40 mg Intravenous BID  . insulin aspart  0-15 Units Subcutaneous TID WC  . insulin aspart  0-5 Units Subcutaneous QHS  . nystatin   Topical TID  . sodium chloride flush  3 mL Intravenous Q12H   Continuous Infusions: . sodium chloride 250 mL (09/11/20 1732)  . cefTRIAXone (ROCEPHIN)  IV 1 g (09/11/20 2236)     LOS: 1 day    Time spent: 40 minutes    Pearla Dubonnet, Medical Student [facility]   If 7PM-7AM, please contact night-coverage [email or web address]  09/12/2020, 8:42 AM   Attending note:   Patient seen and examined with Pearla Dubonnet, Medical student. In addition to supervising the encounter, I played a key role in the decision making process as well as reviewed key findings.  In brief, this is a carrier-year-old male with medical hospital with progressive shortness of breath, orthopnea, lower extremity edema consistent with decompensated CHF.  He is not on any chronic diuretics at home.  He does admit to dietary indiscretions and frequently eats out with fast food.  BNP noted to be elevated at 1500 on admission.  Troponins mildly elevated in the 200-250 range.  He is not having any chest pain at this time.  Last echocardiogram from 06/2020 shows EF of 55 to 60%.  Echocardiogram repeated today with ejection fraction of 35 to 40% and wall motion abnormalities.  Also mention of critical aortic stenosis.  Regarding  his new systolic dysfunction or motion abnormalities, cardiology has been consulted.  At this time, would avoid ARB since he has baseline renal dysfunction as well as blood pressures are borderline.  For the same reason, would avoid adding beta-blockers at this time due to marginal blood pressures.  He is on Lasix 40 IV twice daily.  Urine output has been poor, although on further discussion with nursing, it may not have been accurately recorded since he was having several accidents.  Foley catheter is now placed and urine output will be monitored closely.  In any case, will avoid aggressive diuresis in this patient with critical aortic stenosis who is likely preload dependent  Troponins appear to be relatively flat, although mildly elevated.  He does not have any chest pain.  Echocardiogram does show some wall motion abnormalities.  IV heparin was considered, but will hold off for now in light of patient's recent fairly large subdural hematoma in 06/2020.  Further cardiac work-up per cardiology  Creatinine appears to be near baseline.  Continue to monitor in the setting of diuresis.  [employer]

## 2020-09-13 ENCOUNTER — Inpatient Hospital Stay (HOSPITAL_COMMUNITY): Payer: Medicare Other

## 2020-09-13 ENCOUNTER — Encounter: Payer: Self-pay | Admitting: Physician Assistant

## 2020-09-13 ENCOUNTER — Encounter (HOSPITAL_COMMUNITY): Payer: Self-pay | Admitting: Internal Medicine

## 2020-09-13 ENCOUNTER — Inpatient Hospital Stay: Admit: 2020-09-13 | Payer: Self-pay | Admitting: Pediatrics

## 2020-09-13 DIAGNOSIS — N183 Chronic kidney disease, stage 3 unspecified: Secondary | ICD-10-CM | POA: Insufficient documentation

## 2020-09-13 DIAGNOSIS — I5021 Acute systolic (congestive) heart failure: Secondary | ICD-10-CM

## 2020-09-13 DIAGNOSIS — I350 Nonrheumatic aortic (valve) stenosis: Secondary | ICD-10-CM | POA: Insufficient documentation

## 2020-09-13 DIAGNOSIS — E119 Type 2 diabetes mellitus without complications: Secondary | ICD-10-CM | POA: Insufficient documentation

## 2020-09-13 DIAGNOSIS — I5042 Chronic combined systolic (congestive) and diastolic (congestive) heart failure: Secondary | ICD-10-CM | POA: Insufficient documentation

## 2020-09-13 DIAGNOSIS — I4510 Unspecified right bundle-branch block: Secondary | ICD-10-CM | POA: Insufficient documentation

## 2020-09-13 DIAGNOSIS — I10 Essential (primary) hypertension: Secondary | ICD-10-CM | POA: Insufficient documentation

## 2020-09-13 LAB — COMPREHENSIVE METABOLIC PANEL
ALT: 19 U/L (ref 0–44)
AST: 17 U/L (ref 15–41)
Albumin: 3.1 g/dL — ABNORMAL LOW (ref 3.5–5.0)
Alkaline Phosphatase: 63 U/L (ref 38–126)
Anion gap: 9 (ref 5–15)
BUN: 46 mg/dL — ABNORMAL HIGH (ref 8–23)
CO2: 24 mmol/L (ref 22–32)
Calcium: 8.5 mg/dL — ABNORMAL LOW (ref 8.9–10.3)
Chloride: 98 mmol/L (ref 98–111)
Creatinine, Ser: 1.82 mg/dL — ABNORMAL HIGH (ref 0.61–1.24)
GFR, Estimated: 37 mL/min — ABNORMAL LOW (ref >60)
Glucose, Bld: 115 mg/dL — ABNORMAL HIGH (ref 70–99)
Potassium: 4.2 mmol/L (ref 3.5–5.1)
Sodium: 131 mmol/L — ABNORMAL LOW (ref 135–145)
Total Bilirubin: 2 mg/dL — ABNORMAL HIGH (ref 0.3–1.2)
Total Protein: 6.9 g/dL (ref 6.5–8.1)

## 2020-09-13 LAB — URINE CULTURE: Culture: <10000 — AB

## 2020-09-13 LAB — GLUCOSE, CAPILLARY
Glucose-Capillary: 141 mg/dL — ABNORMAL HIGH (ref 70–99)
Glucose-Capillary: 138 mg/dL — ABNORMAL HIGH (ref 70–99)
Glucose-Capillary: 125 mg/dL — ABNORMAL HIGH (ref 70–99)
Glucose-Capillary: 160 mg/dL — ABNORMAL HIGH (ref 70–99)
Glucose-Capillary: 149 mg/dL — ABNORMAL HIGH (ref 70–99)

## 2020-09-13 LAB — MAGNESIUM: Magnesium: 2 mg/dL (ref 1.7–2.4)

## 2020-09-13 LAB — MRSA PCR SCREENING: MRSA by PCR: NEGATIVE

## 2020-09-13 MED ORDER — ONDANSETRON HCL 4 MG/2ML IJ SOLN
4.0000 mg | Freq: Four times a day (QID) | INTRAMUSCULAR | Status: DC | PRN
  Administered 2020-09-13: 4 mg via INTRAVENOUS
  Filled 2020-09-13: qty 2

## 2020-09-13 MED ORDER — BISOPROLOL FUMARATE 5 MG PO TABS
2.5000 mg | ORAL_TABLET | Freq: Every day | ORAL | Status: DC
  Administered 2020-09-13 – 2020-09-14 (×2): 2.5 mg via ORAL
  Filled 2020-09-13 (×4): qty 0.5

## 2020-09-13 MED ORDER — SODIUM CHLORIDE FLUSH 0.9 % IV SOLN
INTRAVENOUS | Status: AC
  Filled 2020-09-13: qty 10

## 2020-09-13 MED ORDER — CHLORHEXIDINE GLUCONATE CLOTH 2 % EX PADS
6.0000 | MEDICATED_PAD | Freq: Every day | CUTANEOUS | Status: DC
  Administered 2020-09-13 – 2020-09-29 (×13): 6 via TOPICAL

## 2020-09-13 NOTE — Progress Notes (Signed)
Patient refuses to eat lunch, doesn't want lunch time insulin, stating he's not going to eat and just wants to rest. Patient verbalizes not feeling well, vomited once, brown and undigested food, after giving PRN zofran. MD aware. Daughter at bedside. Patient to be transferred to [facility] for cardiac care, patient, family and [facility] for this shift are aware.

## 2020-09-13 NOTE — Progress Notes (Signed)
Patient received bed at [facility] in room 206.Called report to [facility] on [place].  Daughter and patient aware. Spoke with [facility] and they are waiting for bed to be approved to arrange transfer.

## 2020-09-13 NOTE — Consult Note (Addendum)
Cardiology Consult    Patient ID: Brett Williamson; AS:[id number]; [birth year]   Admit date: 09/11/2020 Date of Consult: 09/13/2020  Primary Care Provider: Neale Burly, MD Primary Cardiologist: [facility] - Dr. Domenic Polite  Patient Profile    Brett Williamson is a 80 y.o. male with past medical history of chronic diastolic CHF, severe AS (by echo in 06/2020), subdural hematoma in 06/2020 secondary to mechanical fall (s/p craniotomy hematoma evacuation), HTN and Type 2 DM who is being seen today for the evaluation of CHF at the request of Dr. Roderic Palau.   History of Present Illness    Mr. Dufour presented to [facility] ED on 09/11/2020 for evaluation of worsening dyspnea and lower extremity edema for the past several months. Family reported that his prior medications had been discontinued following his prolonged admission to inpatient rehab in 06/2020. By review of notes, he was previously on Benzapril 20 mg daily and HCTZ 25 mg daily.  In talking with the patient today, he reports he was first told he had a murmur in 2013-2014 but does not recall having an echocardiogram. Was initially doing well after his hospitalization in 06/2020 but over the past 3-4 weeks, he started to experience worsening dyspnea on exertion and lower extremity edema. No associated chest pain or palpitations. No specific orthopnea or PND but he sleeps in his recliner most nights. He does report intermittent dizziness but says this resembles his prior vertigo. No presyncope or syncopal events. He does perform ADL's independently but says his daughter helps with upkeep around the house. He uses a rolling walker.   Initial labs showed WBC 13.2, Hgb 14.2, platelets 214, Na+ 132, K+ 4.7 and creatinine 1.67 (variable from 1.4 - 2.1 in 06/2020). Lactic Acid 1.7 with repeat of 2.2. TSH 1.740. Hgb A1c 7.1. BNP 1413. Initial HS Troponin 211 with repeat values of 207, 231, 248 and 252. UA was concerning for a UTI. CXR consistent with CHF. EKG shows  NSR, HR 92 with known RBBB. A repeat echocardiogram was obtained and shows his EF is now reduced at 35-40% with Grade 2 DD, severely reduced RV function, mild MR and severe/critical AS with mean gradient measuring 65.7 mmHg.   He was initially started on IV Lasix 40mg  BID and this was increased to 80mg  BID yesterday but he received 40mg  in AM and 80mg  in PM. Weights not recorded but he had a net output of -1.6 L yesterday. Lasix reduced to 40mg  BID today due to rise in creatinine to 1.85.   Past Medical History:  Diagnosis Date  . CHF (congestive heart failure) ([place])   . Diabetes ([place])   . Frequent falls   . HTN (hypertension)     Past Surgical History:  Procedure Laterality Date  . CRANIOTOMY Right 06/19/2020   Procedure: CRANIOTOMY HEMATOMA EVACUATION SUBDURAL;  Surgeon: Erline Levine, MD;  Location: [facility];  Service: Neurosurgery;  Laterality: Right;  . ROTATOR CUFF REPAIR       Home Medications:  Prior to Admission medications   Medication Sig Start Date End Date Taking? Authorizing Provider  acetaminophen (TYLENOL) 325 MG tablet Take 650 mg by mouth every 6 (six) hours as needed.   Yes [provider]  traMADol (ULTRAM) 50 MG tablet Take 50 mg by mouth 2 (two) times daily as needed. 07/19/20  Yes [provider]  triamcinolone (KENALOG) 0.1 % Apply 1 application topically See admin instructions. Apply to affected area 2 to 3 times a week 07/12/20  Yes [provider]    Inpatient Medications: Scheduled Meds: . aspirin EC  81 mg Oral Daily  . furosemide  40 mg Intravenous BID  . Gerhardt's butt cream   Topical Daily  . insulin aspart  0-15 Units Subcutaneous TID WC  . insulin aspart  0-5 Units Subcutaneous QHS  . nystatin   Topical TID  . sodium chloride flush  3 mL Intravenous Q12H   Continuous Infusions: . sodium chloride 10 mL/hr at 09/12/20 1900   PRN Meds: sodium chloride, acetaminophen  Allergies:   No Known Allergies  Social History:    Social History   Tobacco Use  . Smoking status: Never Smoker  . Smokeless tobacco: Never Used  Substance Use Topics  . Alcohol use: Never    Family History:    Family History  Problem Relation Age of Onset  . Heart attack Father       Review of Systems    General:  No chills, fever, night sweats or weight changes.  Cardiovascular:  No chest pain, orthopnea, palpitations, paroxysmal nocturnal dyspnea. Positive for dyspnea on exertion and edema.  Dermatological: No rash, lesions/masses Respiratory: No cough, dyspnea Urologic: No hematuria, dysuria Abdominal:   No nausea, vomiting, diarrhea, bright red blood per rectum, melena, or hematemesis Neurologic:  No visual changes, wkns, changes in mental status. All other systems reviewed and are otherwise negative except as noted above.  Physical Exam/Data    Vitals:   09/12/20 2200 09/12/20 2340 09/13/20 0700 09/13/20 0800  BP: 100/74 103/76 99/74 116/86  Pulse: 87 88 85   Resp: (!) 25 (!) 22 (!) 23 (!) 25  Temp:  97.8 F (36.6 C)  98.6 F (37 C)  TempSrc:  Oral  Oral  SpO2: 98% 98% 97% 98%  Weight:      Height:        Intake/Output Summary (Last 24 hours) at 09/13/2020 0901 Last data filed at 09/13/2020 0600 Gross per 24 hour  Intake 1516 ml  Output 3200 ml  Net -1684 ml   Filed Weights   09/11/20 1525  Weight: 117 kg   Body mass index is 35.98 kg/m.   General: Pleasant male appearing in NAD Psych: Normal affect. Neuro: Alert and oriented X 3. Moves all extremities spontaneously. HEENT: Normal  Neck: Supple without bruits. JVD at 10 cm. Lungs:  Resp regular and unlabored, rales along bases bilaterally. Heart: RRR no s3, s4, 3/6 SEM along RUSB.  Abdomen: Soft, non-tender, non-distended, BS + x 4.  Extremities: No clubbing. 1+ pitting edema with Unna boots in place.    EKG:  The EKG was personally reviewed and demonstrates: NSR, HR 92 with known RBBB.    Labs/Studies     Relevant CV  Studies:  Echocardiogram: 06/2020 IMPRESSIONS    1. Severe aortic stenosis.  2. Left ventricular ejection fraction, by estimation, is 55 to 60%. The  left ventricle has normal function. The left ventricle has no regional  wall motion abnormalities. There is moderate left ventricular hypertrophy.  Left ventricular diastolic  parameters are consistent with Grade II diastolic dysfunction  (pseudonormalization).  3. Right ventricular systolic function is normal. The right ventricular  size is normal.  4. Left atrial size was mildly dilated.  5. The mitral valve is normal in structure. Mild mitral valve  regurgitation. No evidence of mitral stenosis. Severe mitral annular  calcification.  6. The aortic valve is calcified. There is severe calcifcation of the  aortic valve. There is severe thickening  of the aortic valve. Aortic valve  regurgitation is mild to moderate. Severe aortic valve stenosis. Aortic  valve area, by VTI measures 0.57  cm. Aortic valve mean gradient measures 51.5 mmHg. Aortic valve Vmax  measures 4.60 m/s.  7. Aortic dilatation noted. There is mild dilatation of the ascending  aorta, measuring 38 mm.  8. The inferior vena cava is normal in size with greater than 50%  respiratory variability, suggesting right atrial pressure of 3 mmHg.   Comparison(s): No prior Echocardiogram.    Echocardiogram: 09/12/2020 IMPRESSIONS    1. Left ventricular ejection fraction, by estimation, is 35 to 40%. The  left ventricle has moderately decreased function. The left ventricle  demonstrates regional wall motion abnormalities (see scoring  diagram/findings for description). There is mild  left ventricular hypertrophy. Left ventricular diastolic parameters are  consistent with Grade II diastolic dysfunction (pseudonormalization).  2. Right ventricular systolic function is severely reduced. The right  ventricular size is normal. Tricuspid regurgitation signal is  inadequate  for assessing PA pressure.  3. The mitral valve is abnormal. Mild mitral valve regurgitation.  Moderate mitral annular calcification.  4. The aortic valve has an indeterminant number of cusps. There is severe  calcifcation of the aortic valve. Aortic valve regurgitation is not  visualized. Severe-critical aortic valve stenosis. Aortic valve mean  gradient measures 65.7 mmHg. Aortic valve  Vmax measures 4.89 m/s.  5. The inferior vena cava is normal in size with <50% respiratory  variability, suggesting right atrial pressure of 8 mmHg.   Laboratory Data:  Chemistry Recent Labs  Lab 09/11/20 1528 09/12/20 0708 09/13/20 0736  NA 132* 134* 131*  K 4.7 4.5 4.2  CL 104 104 98  CO2 21* 21* 24  GLUCOSE 117* 137* 115*  BUN 44* 46* 46*  CREATININE 1.67* 1.68* 1.82*  CALCIUM 8.7* 8.6* 8.5*  GFRNONAA 41* 41* 37*  ANIONGAP 7 9 9     Recent Labs  Lab 09/11/20 1528 09/12/20 0708 09/13/20 0736  PROT 7.7 6.9 6.9  ALBUMIN 3.6 3.2* 3.1*  AST 22 18 17   ALT 26 21 19   ALKPHOS 76 65 63  BILITOT 2.3* 2.4* 2.0*   Hematology Recent Labs  Lab 09/11/20 1528 09/12/20 0708  WBC 13.2* 10.0  RBC 5.81 5.36  HGB 14.2 12.9*  HCT 47.2 43.4  MCV 81.2 81.0  MCH 24.4* 24.1*  MCHC 30.1 29.7*  RDW 15.7* 15.0  PLT 214 203    BNP Recent Labs  Lab 09/11/20 1600 09/12/20 0708  BNP 1,413.0* 1,561.0*     Radiology/Studies:  DG Chest 2 View  Result Date: 09/11/2020 CLINICAL DATA:  Sepsis EXAM: CHEST - 2 VIEW COMPARISON:  June 19, 2020 FINDINGS: There are diffuse bilateral airspace opacities. The lung volumes are low. The heart size is enlarged. There are bilateral pleural effusions, right greater than left. No pneumothorax or acute osseous abnormality. There is vascular congestion with interstitial edema. IMPRESSION: 1. Cardiomegaly with findings concerning for congestive heart failure. 2. Low lung volumes with small bilateral pleural effusions. Electronically Signed   By:  Constance Holster M.D.   On: 09/11/2020 16:38   Assessment & Plan    1. Acute on Chronic Combined Systolic and Diastolic CHF - Presented with a 3-4 week history of progressive dyspnea on exertion and edema. BNP elevated to 1413 and repeat echo shows his EF is now reduced at 35-40% with Grade 2 DD and severely reduced RV function in the setting of severe/critical AS. - An attempt was made  to titrate Lasix yesterday but renal function worsened, therefore will continue with 40mg  BID. Follow I&O's along with daily weights.  - Discussed with Dr. Domenic Polite and will add low-dose Bisoprolol. No ACE-I/ARB/ARNI for now given his variable renal function and soft BP.   2. Severe Aortic Stenosis - Echo this admission shows severe/critical AS with mean gradient measuring 65.7 mmHg. Reviewed findings with the patient today and possible further workup including referral to the Structural Heart Team and cardiac catheterization with possibility of TAVR pending candidacy. Will send a message to the Structural Heart Team today in regards to the need for inpatient evaluation versus close outpatient follow-up given his critical AS.   3. Elevated Troponin Values -  HS Troponin values have been flat at 211, 207, 231, 248 and 252. He reports dyspnea on exertion prior to admission but denies any recent chest pain. Would ultimately plan for a R/LHC as part of continued workup for his severe AS as an outpatient.  - His flat trend is most consistent with demand ischemia and not consistent with ACS. Agree with discontinuation of Heparin. Continue ASA and will start a BB as outlined above. Check FLP.   4. History of Subdural Hematoma  - Occurred after a mechanical fall in 06/2020 and he is s/p craniotomy hematoma evacuation. A&Ox3 during today's encounter and makes his own medical decisions.   5. Stage 3 CKD - Creatinine was previously variable from 1.4 - 2.1 in 06/2020. At 1.67 on admission and trending up to 1.85 today.  Agree with dose reduction of Lasix.    For questions or updates, please contact [place] Please consult [email or web address] for contact info under Cardiology/STEMI.  Signed, Erma Heritage, PA-C 09/13/2020, 9:01 AM Pager: [phone]   Attending note:  Patient seen and examined.  I reviewed his records and discussed the case with Ms. Delano Metz, I agree with her above findings.  Mr. Norville presents with acute systolic heart failure with newly documented cardiomyopathy in comparison to last echocardiogram in November 2021, also progressive aortic stenosis in the severe to critical range.  He has been progressively short of breath, also experiencing leg swelling over the last several weeks.  Prior to now he has not had any extensive cardiac evaluation, no follow-up of his aortic stenosis.  When he was hospitalized in November 2021 he had presented with a mechanical fall and ultimately underwent a craniotomy for evacuation of a subdural hematoma.  He does live in his own home and functional with ADLs, has assistance from other family members.  On examination this morning he appears comfortable.  He is afebrile, heart rate in the 80s to 90s in sinus rhythm by telemetry which I personally reviewed.  Systolic blood pressure ranging 90s to 120s.  Lungs exhibit decreased breath sounds at the bases.  Cardiac exam reveals RRR with 3/6 systolic murmur at the base and diminished second heart sound.  He has peripheral edema evident with Unna boots in place.  Pertinent lab work includes potassium 4.2, BUN 46, creatinine 1.82, high-sensitivity troponin I levels of 248 and 252, BNP 1561, hemoglobin 12.9, platelets 203.  Chest x-ray shows small pleural effusions, right greater than left with vascular congestion and interstitial edema.  I personally reviewed his ECG from 09/11/2020 which shows sinus rhythm with prolonged PR interval and right bundle branch block.  Situation reviewed with the patient and also  Structural Heart Team in [place].  For now would continue IV Lasix, agree with cutting back dose, also starting low-dose  bisoprolol.  Hold off on ARB/ANRI for now.  He will be transferred to the cardiology service at [facility] for further stabilization and initial testing related to TAVR candidacy.  Satira Sark, M.D., F.A.C.C.

## 2020-09-13 NOTE — Plan of Care (Signed)

## 2020-09-13 NOTE — Progress Notes (Addendum)
PROGRESS NOTE    Brett Williamson  [id number] DOB: 18-Dec-1940 DOA: 09/11/2020 PCP: Neale Burly, MD    Brief Narrative:  Brett Williamson is an 80yo male with a history of HFpEF, T2DM, CKD 3b, and a history of subdural hematoma s/p craniotomy (06/19/20) who presents with worsening dyspnea on exertion, orthopnea, and BLE edema.   Assessment & Plan:   Principal Problem:   Acute on chronic diastolic CHF (congestive heart failure) (HCC) Active Problems:   History of subdural hematoma   Essential hypertension   Controlled type 2 diabetes mellitus with hyperglycemia, without long-term current use of insulin (HCC)   Elevated troponin   Physical deconditioning   Candidiasis of skin   Acute cystitis   Chronic stasis dermatitis   Chronic renal failure, stage 3b (HCC)   Acute on chronic HFrEF: Elevated troponin: Remains volume overloaded on exam. Echo yesterday showing newly reduced EF (35-40%, decreased from 55-60% in November), wall motion abnormalities, and critical aortic stenosis. Has a new 1.5L O2 requirement. BNP 1561.0, Troponin 252.   -1.6L in past 24 hours with IV Lasix 40mg  BID. Discussed increasing his Lasix yesterday, but holding off as he is preload dependent 2/2 his aortic stenosis.  - Cardiology recommending transfer to [facility] for further workup of his AS.  - Continue IV Lasix 40mg  BID - Monitor strict I&O; foley in place  - Wean O2 to room air as tolerated   Chronic renal failure, stage 3b ([place]) Patient with history of CKD3b. Cr bumped 1.67>1.82 after initiation of Lasix. -Daily CMP  -Avoid nephrotoxic agents as able  Chronic stasis dermatitis Worsening lower extremity edema due to volume overload also noted with bulla on left lower extremity weeping serous fluid -WOC consulted; recommending butt paste to inguinal folks and buttocks, interdry Ag to skin folds with breakdown. Xeroform gauze to blisters of LLE, Kerlix wrap of BLE.  -Continue diuresis as  above  Asymptomatic bacteruria UA noted with Lrg LE, >50 WBC, few bacteria; patient has inverted penis. S/p Rocephin x1. However, given that he was asymptomatic and afebrile, abx were d/c'd. - Urine culture showed insignificant growth  Candidiasis of skin Poorhygiene at home. Likely exacerbated by fluid overload and weeping edema of intertriginous areas.  - WOC recs as above - Nystatin powder   Physical deconditioning Patient discharged from rehab on 07/05/2020. He ambulates with a walker at home however has been having worsening weakness and shortness of breath, likely from CHF exacerbation. -PT eval  History of subdural hematoma  S/pright craniotomy on 06/19/2020. Discharged from rehab on 07/05/2020 -SCD for DVT prophylaxis for now -Patient says he is no longer on Keppra for sz ppx  Controlled type 2 diabetes mellitus with hyperglycemia, without long-term current use of insulin (HCC) Glucoses 127 today. Last A1c 6.6% on 06/19/2020 -Continue carb consistent diet -Continue SSI and CBG monitoring  Essential hypertension On no medications at home. BP controlled today.  -Continue monitoring vitals and if becomes elevated, may warrant resuming therapy. Currently on Lasix   DVT prophylaxis: SCDs Start: 09/11/20 2018  Code Status: Full Family Communication: Daughter Brett Williamson is primary contact. [phone] Disposition Plan: Status is: Inpatient  Remains inpatient appropriate because:Ongoing diagnostic testing needed not appropriate for outpatient work up   Dispo: The patient is from: Home              Anticipated d/c is to: SNF              Anticipated d/c date is: 3 days  Patient currently is not medically stable to d/c.   Difficult to place patient No   Consultants:   Cardiology  Procedures:   Echocardiogram  Antimicrobials:   None    Subjective: [name] reports that he feels about the same today as previous. He notes that he had to  sit up in bed to sleep due to some orthopnea. He says that the oxygen he is receiving via [place] seems to be helping. He voices an understanding of the plan to transfer him to [facility] for further cardiology workup.   Objective: Vitals:   09/12/20 2200 09/12/20 2340 09/13/20 0700 09/13/20 0800  BP: 100/74 103/76 99/74 116/86  Pulse: 87 88 85   Resp: (!) 25 (!) 22 (!) 23 (!) 25  Temp:  97.8 F (36.6 C)  98.6 F (37 C)  TempSrc:  Oral  Oral  SpO2: 98% 98% 97% 98%  Weight:      Height:        Intake/Output Summary (Last 24 hours) at 09/13/2020 0851 Last data filed at 09/13/2020 0600 Gross per 24 hour  Intake 1516 ml  Output 3200 ml  Net -1684 ml   Filed Weights   09/11/20 1525  Weight: 117 kg    Examination:  General exam: Appears calm and comfortable, NAD  Respiratory system: Scattered coarse breath sounds. Normal WOB on 1.5L [place] Cardiovascular system: S1 & S2 heard, RRR. No JVD, murmurs, rubs, gallops or clicks. No pedal edema. Gastrointestinal system: Abdomen is nondistended, soft and nontender. No organomegaly or masses felt. Normal bowel sounds heard. Central nervous system: Alert and oriented. No focal neurological deficits. Extremities: Marked edema of BLE with weeping erythema of groin creases. Skin: Erythema and drainage of abdominal skin folds. Venous stasis changes of BLE Psychiatry: Judgement and insight appear normal. Mood & affect appropriate.     Data Reviewed: I have personally reviewed following labs and imaging studies  CBC: Recent Labs  Lab 09/11/20 1528 09/12/20 0708  WBC 13.2* 10.0  NEUTROABS 9.8* 7.4  HGB 14.2 12.9*  HCT 47.2 43.4  MCV 81.2 81.0  PLT 214 [id number]   Basic Metabolic Panel: Recent Labs  Lab 09/11/20 1528 09/12/20 0708 09/13/20 0736  NA 132* 134* 131*  K 4.7 4.5 4.2  CL 104 104 98  CO2 21* 21* 24  GLUCOSE 117* 137* 115*  BUN 44* 46* 46*  CREATININE 1.67* 1.68* 1.82*  CALCIUM 8.7* 8.6* 8.5*  MG  --  2.1 2.0   GFR: Estimated  Creatinine Clearance: 42.1 mL/min (A) (by C-G formula based on SCr of 1.82 mg/dL (H)). Liver Function Tests: Recent Labs  Lab 09/11/20 1528 09/12/20 0708 09/13/20 0736  AST 22 18 17   ALT 26 21 19   ALKPHOS 76 65 63  BILITOT 2.3* 2.4* 2.0*  PROT 7.7 6.9 6.9  ALBUMIN 3.6 3.2* 3.1*   No results for input(s): LIPASE, AMYLASE in the last 168 hours. No results for input(s): AMMONIA in the last 168 hours. Coagulation Profile: Recent Labs  Lab 09/11/20 1600  INR 1.2   Cardiac Enzymes: No results for input(s): CKTOTAL, CKMB, CKMBINDEX, TROPONINI in the last 168 hours. BNP (last 3 results) No results for input(s): PROBNP in the last 8760 hours. HbA1C: Recent Labs    09/11/20 1528  HGBA1C 7.1*   CBG: Recent Labs  Lab 09/11/20 2349 09/12/20 0720 09/12/20 1327  GLUCAP 127* 150* 167*   Lipid Profile: No results for input(s): CHOL, HDL, LDLCALC, TRIG, CHOLHDL, LDLDIRECT in the last 72  hours. Thyroid Function Tests: Recent Labs    09/11/20 1528  TSH 1.740   Anemia Panel: No results for input(s): VITAMINB12, FOLATE, FERRITIN, TIBC, IRON, RETICCTPCT in the last 72 hours. Sepsis Labs: Recent Labs  Lab 09/11/20 1528 09/11/20 1750  LATICACIDVEN 1.7 2.2*    Recent Results (from the past 240 hour(s))  Culture, blood (Routine x 2)     Status: None (Preliminary result)   Collection Time: 09/11/20  3:28 PM   Specimen: BLOOD  Result Value Ref Range Status   Specimen Description BLOOD RIGHT ANTECUBITAL  Final   Special Requests   Final    BOTTLES DRAWN AEROBIC AND ANAEROBIC Blood Culture adequate volume   Culture   Final    NO GROWTH 2 DAYS Performed at [facility], [address] [place], [place] [address]    Report Status PENDING  Incomplete  Culture, blood (Routine x 2)     Status: None (Preliminary result)   Collection Time: 09/11/20  3:55 PM   Specimen: BLOOD  Result Value Ref Range Status   Specimen Description BLOOD LEFT ANTECUBITAL  Final   Special Requests    Final    BOTTLES DRAWN AEROBIC AND ANAEROBIC Blood Culture adequate volume   Culture   Final    NO GROWTH 2 DAYS Performed at [facility], [address], [place], [place] [address]    Report Status PENDING  Incomplete  SARS Coronavirus 2 by RT PCR (hospital order, performed in [place] hospital lab) Nasopharyngeal Nasopharyngeal Swab     Status: None   Collection Time: 09/11/20  8:22 PM   Specimen: Nasopharyngeal Swab  Result Value Ref Range Status   SARS Coronavirus 2 NEGATIVE NEGATIVE Final    Comment: (NOTE) SARS-CoV-2 target nucleic acids are NOT DETECTED.  The SARS-CoV-2 RNA is generally detectable in upper and lower respiratory specimens during the acute phase of infection. The lowest concentration of SARS-CoV-2 viral copies this assay can detect is 250 copies / mL. A negative result does not preclude SARS-CoV-2 infection and should not be used as the sole basis for treatment or other patient management decisions.  A negative result may occur with improper specimen collection / handling, submission of specimen other than nasopharyngeal swab, presence of viral mutation(s) within the areas targeted by this assay, and inadequate number of viral copies (<250 copies / mL). A negative result must be combined with clinical observations, patient history, and epidemiological information.  Fact Sheet for Patients:   StrictlyIdeas.no  Fact Sheet for Healthcare Providers: BankingDealers.co.za  This test is not yet approved or  cleared by the [place] FDA and has been authorized for detection and/or diagnosis of SARS-CoV-2 by FDA under an Emergency Use Authorization (EUA).  This EUA will remain in effect (meaning this test can be used) for the duration of the COVID-19 declaration under Section 564(b)(1) of the Act, 21 U.S.C. section 360bbb-3(b)(1), unless the authorization is terminated or revoked sooner.  Performed at [facility], [address], [place],  [address]          Radiology Studies: DG Chest 2 View  Result Date: 09/11/2020 CLINICAL DATA:  Sepsis EXAM: CHEST - 2 VIEW COMPARISON:  June 19, 2020 FINDINGS: There are diffuse bilateral airspace opacities. The lung volumes are low. The heart size is enlarged. There are bilateral pleural effusions, right greater than left. No pneumothorax or acute osseous abnormality. There is vascular congestion with interstitial edema. IMPRESSION: 1. Cardiomegaly with findings concerning for congestive heart failure. 2.  Low lung volumes with small bilateral pleural effusions. Electronically Signed   By: Constance Holster M.D.   On: 09/11/2020 16:38   ECHOCARDIOGRAM COMPLETE  Result Date: 09/12/2020    ECHOCARDIOGRAM REPORT   Patient Name:   Brett Williamson Date of Exam: 09/12/2020 Medical Rec #:  [id number]    Height:       71.0 in Accession #:    [id number]   Weight:       258.0 lb Date of Birth:  [birth year]     BSA:          2.350 m Patient Age:    70 years     BP:           107/77 mmHg Patient Gender: M            HR:           85 bpm. Exam Location:  [facility] Procedure: 2D Echo Indications:    Congestive Heart Failure I50.9  History:        Patient has prior history of Echocardiogram examinations, most                 recent 06/20/2020. CHF; Risk Factors:Hypertension, Diabetes and                 Non-Smoker. Elevated Troponin.  Sonographer:    [name] RDCS (AE) Referring Phys: [place]  1. Left ventricular ejection fraction, by estimation, is 35 to 40%. The left ventricle has moderately decreased function. The left ventricle demonstrates regional wall motion abnormalities (see scoring diagram/findings for description). There is mild left ventricular hypertrophy. Left ventricular diastolic parameters are consistent with Grade II diastolic dysfunction (pseudonormalization).  2. Right ventricular systolic function is severely reduced. The right  ventricular size is normal. Tricuspid regurgitation signal is inadequate for assessing PA pressure.  3. The mitral valve is abnormal. Mild mitral valve regurgitation. Moderate mitral annular calcification.  4. The aortic valve has an indeterminant number of cusps. There is severe calcifcation of the aortic valve. Aortic valve regurgitation is not visualized. Severe-critical aortic valve stenosis. Aortic valve mean gradient measures 65.7 mmHg. Aortic valve Vmax measures 4.89 m/s.  5. The inferior vena cava is normal in size with <50% respiratory variability, suggesting right atrial pressure of 8 mmHg. FINDINGS  Left Ventricle: Left ventricular ejection fraction, by estimation, is 35 to 40%. The left ventricle has moderately decreased function. The left ventricle demonstrates regional wall motion abnormalities. The left ventricular internal cavity size was normal in size. There is mild left ventricular hypertrophy. Left ventricular diastolic parameters are consistent with Grade II diastolic dysfunction (pseudonormalization).  LV Wall Scoring: The anterior wall, entire lateral wall, and basal inferior segment are akinetic. The mid and distal anterior septum, apical anterior segment, basal inferoseptal segment, and apex are hypokinetic. The mid and distal inferior wall, basal anteroseptal segment, and mid inferoseptal segment are normal. Right Ventricle: The right ventricular size is normal. No increase in right ventricular wall thickness. Right ventricular systolic function is severely reduced. Tricuspid regurgitation signal is inadequate for assessing PA pressure. Left Atrium: Left atrial size was normal in size. Right Atrium: Right atrial size was normal in size. Pericardium: There is no evidence of pericardial effusion. Mitral Valve: The mitral valve is abnormal. There is mild thickening of the mitral valve leaflet(s). There is mild calcification of the mitral valve leaflet(s). Moderate mitral annular calcification.  Mild mitral valve regurgitation. Tricuspid Valve: The tricuspid valve is grossly normal. Tricuspid  valve regurgitation is trivial. Aortic Valve: The aortic valve has an indeterminant number of cusps. There is severe calcifcation of the aortic valve. Aortic valve regurgitation is not visualized. Severe aortic stenosis is present. Aortic valve mean gradient measures 65.7 mmHg. Aortic valve peak gradient measures 95.8 mmHg. Aortic valve area, by VTI measures 0.24 cm. Pulmonic Valve: The pulmonic valve was grossly normal. Pulmonic valve regurgitation is mild. Aorta: The aortic root is normal in size and structure. Venous: The inferior vena cava is normal in size with less than 50% respiratory variability, suggesting right atrial pressure of 8 mmHg. IAS/Shunts: No atrial level shunt detected by color flow Doppler.  LEFT VENTRICLE PLAX 2D LVIDd:         5.57 cm  Diastology LVIDs:         4.63 cm  LV e' medial:    3.81 cm/s LV PW:         1.24 cm  LV E/e' medial:  29.7 LV IVS:        1.25 cm  LV e' lateral:   4.95 cm/s LVOT diam:     1.90 cm  LV E/e' lateral: 22.8 LV SV:         28 LV SV Index:   12 LVOT Area:     2.84 cm  RIGHT VENTRICLE RV S prime:     10.10 cm/s TAPSE (M-mode): 2.4 cm LEFT ATRIUM             Index       RIGHT ATRIUM           Index LA diam:        4.80 cm 2.04 cm/m  RA Area:     14.60 cm LA Vol (A2C):   85.4 ml 36.35 ml/m RA Volume:   40.90 ml  17.41 ml/m LA Vol (A4C):   69.9 ml 29.75 ml/m LA Biplane Vol: 83.4 ml 35.50 ml/m  AORTIC VALVE AV Area (Vmax):    0.27 cm AV Area (Vmean):   0.24 cm AV Area (VTI):     0.24 cm AV Vmax:           489.33 cm/s AV Vmean:          386.667 cm/s AV VTI:            1.163 m AV Peak Grad:      95.8 mmHg AV Mean Grad:      65.7 mmHg LVOT Vmax:         46.97 cm/s LVOT Vmean:        33.167 cm/s LVOT VTI:          0.099 m LVOT/AV VTI ratio: 0.09  AORTA Ao Root diam: 3.10 cm MITRAL VALVE MV Area (PHT): 3.13 cm     SHUNTS MV Decel Time: 242 msec     Systemic VTI:   0.10 m MR Peak grad: 105.3 mmHg    Systemic Diam: 1.90 cm MR Mean grad: 68.0 mmHg MR Vmax:      513.00 cm/s MR Vmean:     379.0 cm/s MV E velocity: 113.00 cm/s MV A velocity: 79.30 cm/s MV E/A ratio:  1.42 Rozann Lesches MD Electronically signed by Rozann Lesches MD Signature Date/Time: 09/12/2020/2:29:55 PM    Final         Scheduled Meds: . aspirin EC  81 mg Oral Daily  . furosemide  40 mg Intravenous BID  . Gerhardt's butt cream   Topical Daily  . insulin aspart  0-15  Units Subcutaneous TID WC  . insulin aspart  0-5 Units Subcutaneous QHS  . nystatin   Topical TID  . sodium chloride flush  3 mL Intravenous Q12H   Continuous Infusions: . sodium chloride 10 mL/hr at 09/12/20 1900     LOS: 2 days    Time spent: 30 minutes   Pearla Dubonnet, MD [facility]   If 7PM-7AM, please contact night-coverage [email or web address]  09/13/2020, 8:51 AM   Attending note:   Patient seen and examined with Pearla Dubonnet, Medical student. In addition to supervising the encounter, I played a key role in the decision making process as well as reviewed key findings.  Patient remains volume overloaded and needs continued IV diuresis.  Echo shows critical aortic stenosis.  Has been seen by cardiology with plans to transfer to [facility] for heart valve evaluation by structural heart team.  His daughter was updated at the bedside.  He will be transferred under cardiology service.  Hospitalist service will be available as needed any further questions/concerns.  We will plan on signing off for now.  [employer]

## 2020-09-13 NOTE — Consult Note (Addendum)
[place] VALVE TEAM  Cardiology Consultation:   Patient ID: Brett Williamson MRN: [id number]; DOB: 1940/10/05  Admit date: 09/11/2020 Date of Consult: 09/13/2020  Primary Care Provider: Neale Burly, MD [facility] Cardiologist: Dr. Domenic Polite ( new this admission) [place] Electrophysiologist:  None   Patient Profile:   Brett Williamson is a 80 y.o. male with a hx of chronic diastolic CHF, severe AS (by echo in 06/2020), subdural hematoma in 06/2020 secondary to mechanical fall (s/p craniotomy hematoma evacuation), HTN and Type 2 DMwho was admitted to [facility] on 09/11/20 with acute CHF and found to have newly reduced EF 35-40%, RV dysfunction and critical AS. He was transferred to [facility] for further cardiac work up and evaluation by the structural heart team. He is being seen today for the evaluation of critical AS at the request of Dr. Domenic Polite.  History of Present Illness:   [name] lives in [place], [place] independently. He drove up until 06/2020 when he had a fall resulting in a subdural hematoma.  He does perform ADL's independently with the help of his daughter who helps around the house. He ambulates with the aid of the walker. He is not very active at baseline and the most exertion he gets is going out to eat with his girlfriend. He is edentulous and wears full dentures.   He has never been followed by cardiology. He does follow with  PCP who had previously told him he had a murmur but reportedly never ordered an echo.   He was admitted in 06/2020 a large right frontal subdural hematoma with falling and left hemiparesis.  He was taken to the OR and underwent uncomplicated right craniotomy for subdural hematoma. He was discharged to inpatient rehab and ultimately recovered. Of note, during this admission an echo was obtained which showed EF 55-60%, G2DD, normal RV function, mild LAE, severe MAC, mild MR, and severe AS with a mean gradient of 51.5 mm  hg, peak gradient of 84.6 mm Hg, AVA 0.55 cm2, DVI 0.15, SVI 28 and mild dilation of the ascending aorta. Cardiology was not consulted during this admission.   He then presented to [facility] on 2/1 with a several week history of worsening dyspnea and LE edema. Initial labs showed WBC 13.2, Hgb 14.2, platelets 214, Na+ 132, K+ 4.7 and creatinine 1.67 (variable from 1.4 - 2.1 in 06/2020). Lactic Acid 1.7 with repeat of 2.2. TSH 1.740. Hgb A1c 7.1. BNP 1413. Initial HS Troponin 211 with repeat values of 207, 231, 248 and 252. UA was concerning for a UTI. CXR consistent with CHF. EKG shows NSR, HR 92 with known RBBB. A repeat echocardiogram was obtained and shows his EF is now reduced at 35-40% with G2DD, severely reduced RV function, mild MR and severe/critical AS with mean gradient 65.7 mmHg, peak grad 95.8 mmHg, AVA 0.24 cm2, DVI 0.09, SVI 12. He was stated on IV diuresis which had to be deescalated due to worsening renal function with creat up to 1.85-->2.18. Given new LV dysfunction, acute CHF and critical AS, he was transferred to [facility] for cardiology work up and to be seen by structural heart.   Today he is seen laying in bed. Still having intermittent dyspnea. He reports LE edema, orthopnea and PND. Lower extremities are wrapped. No dizziness or syncope.   Past Medical History:  Diagnosis Date  . Chronic combined systolic and diastolic CHF (congestive heart failure) ([place])   . Chronic kidney disease (CKD), stage III (  moderate) ([place])   . Diabetes ([place])   . Frequent falls   . HTN (hypertension)   . Morbid obesity ([place])   . RBBB   . Severe aortic stenosis   . Subdural hematoma ([place]) 06/2020   after a mechanical fall    Past Surgical History:  Procedure Laterality Date  . CRANIOTOMY Right 06/19/2020   Procedure: CRANIOTOMY HEMATOMA EVACUATION SUBDURAL;  Surgeon: Erline Levine, MD;  Location: [facility];  Service: Neurosurgery;  Laterality: Right;  . ROTATOR CUFF REPAIR       Home Medications:  Prior to  Admission medications   Medication Sig Start Date End Date Taking? Authorizing Provider  acetaminophen (TYLENOL) 325 MG tablet Take 650 mg by mouth every 6 (six) hours as needed.   Yes [provider]  traMADol (ULTRAM) 50 MG tablet Take 50 mg by mouth 2 (two) times daily as needed. 07/19/20  Yes [provider]  triamcinolone (KENALOG) 0.1 % Apply 1 application topically See admin instructions. Apply to affected area 2 to 3 times a week 07/12/20  Yes [provider]    Inpatient Medications: Scheduled Meds: . aspirin EC  81 mg Oral Daily  . bisoprolol  2.5 mg Oral Daily  . furosemide  40 mg Intravenous BID  . Gerhardt's butt cream   Topical Daily  . insulin aspart  0-15 Units Subcutaneous TID WC  . insulin aspart  0-5 Units Subcutaneous QHS  . nystatin   Topical TID  . sodium chloride flush  3 mL Intravenous Q12H   Continuous Infusions: . sodium chloride 10 mL/hr at 09/13/20 1000   PRN Meds: sodium chloride, acetaminophen, ondansetron (ZOFRAN) IV  Allergies:   No Known Allergies  Social History:   Social History   Socioeconomic History  . Marital status: Widowed    Spouse name: Not on file  . Number of children: Not on file  . Years of education: Not on file  . Highest education level: Not on file  Occupational History  . Not on file  Tobacco Use  . Smoking status: Never Smoker  . Smokeless tobacco: Never Used  Vaping Use  . Vaping Use: Never used  Substance and Sexual Activity  . Alcohol use: Never  . Drug use: Never  . Sexual activity: Not Currently  Other Topics Concern  . Not on file  Social History Narrative  . Not on file   Social Determinants of Health   Financial Resource Strain: Not on file  Food Insecurity: Not on file  Transportation Needs: Not on file  Physical Activity: Not on file  Stress: Not on file  Social Connections: Not on file  Intimate Partner Violence: Not on file    Family History:    Family History   Problem Relation Age of Onset  . Heart attack Father      ROS:  Please see the history of present illness.  All other ROS reviewed and negative.     Physical Exam/Data:   Vitals:   09/13/20 1300 09/13/20 1400 09/13/20 1500 09/13/20 1600  BP: 1_0 97/75  Pulse:    80  Resp: (!) 23 (!) 22 (!) 22 (!) 25  Temp:      TempSrc:      SpO2:    97%  Weight:      Height:        Intake/Output Summary (Last 24 hours) at 09/13/2020 1644 Last data filed at 09/13/2020 1400 Gross per 24 hour  Intake 1338 ml  Output 3850 ml  Net -2512 ml   Last 3 Weights 09/11/2020 07/03/2020 06/25/2020  Weight (lbs) 258 lb 255 lb 15.3 oz 258 lb 13.1 oz  Weight (kg) 117.028 kg 116.1 kg 117.4 kg     Body mass index is 35.98 kg/m.  General:  Well nourished, well developed, in no acute distress HEENT: normal Lymph: no adenopathy Neck: no JVD Endocrine:  No thryomegaly Vascular: No carotid bruits; FA pulses 2+ bilaterally without bruits  Cardiac:  normal S1, S2; RRR; soft systolic murmur Lungs: diminished breath sounds Abd: soft, nontender, no hepatomegaly  Ext: 2+ bilateral LE edema with wrapped legs.  Musculoskeletal:  No deformities, BUE and BLE strength normal and equal Skin: warm and dry. Bilateral groin yeast infections with extension into scrotum Neuro:  CNs 2-12 intact, no focal abnormalities noted Psych:  Normal affect   EKG:  The EKG was personally reviewed and demonstrates:  Sinus with RBBB,  Telemetry:  Telemetry was personally reviewed and demonstrates:  sinus  Relevant CV Studies: IMPRESSIONS  1. Left ventricular ejection fraction, by estimation, is 35 to 40%. The  left ventricle has moderately decreased function. The left ventricle  demonstrates regional wall motion abnormalities (see scoring  diagram/findings for description). There is mild  left ventricular hypertrophy. Left ventricular diastolic parameters are  consistent with Grade II diastolic dysfunction  (pseudonormalization).  2. Right ventricular systolic function is severely reduced. The right  ventricular size is normal. Tricuspid regurgitation signal is inadequate  for assessing PA pressure.  3. The mitral valve is abnormal. Mild mitral valve regurgitation.  Moderate mitral annular calcification.  4. The aortic valve has an indeterminant number of cusps. There is severe  calcifcation of the aortic valve. Aortic valve regurgitation is not  visualized. Severe-critical aortic valve stenosis. Aortic valve mean  gradient measures 65.7 mmHg. Aortic valve  Vmax measures 4.89 m/s.  5. The inferior vena cava is normal in size with <50% respiratory  variability, suggesting right atrial pressure of 8 mmHg.   FINDINGS  Left Ventricle: Left ventricular ejection fraction, by estimation, is 35  to 40%. The left ventricle has moderately decreased function. The left  ventricle demonstrates regional wall motion abnormalities. The left  ventricular internal cavity size was  normal in size. There is mild left ventricular hypertrophy. Left  ventricular diastolic parameters are consistent with Grade II diastolic  dysfunction (pseudonormalization).     LV Wall Scoring:  The anterior wall, entire lateral wall, and basal inferior segment are  akinetic. The mid and distal anterior septum, apical anterior segment,  basal  inferoseptal segment, and apex are hypokinetic. The mid and distal  inferior  wall, basal anteroseptal segment, and mid inferoseptal segment are normal.   Right Ventricle: The right ventricular size is normal. No increase in  right ventricular wall thickness. Right ventricular systolic function is  severely reduced. Tricuspid regurgitation signal is inadequate for  assessing PA pressure.   Left Atrium: Left atrial size was normal in size.   Right Atrium: Right atrial size was normal in size.   Pericardium: There is no evidence of pericardial effusion.   Mitral Valve: The  mitral valve is abnormal. There is mild thickening of  the mitral valve leaflet(s). There is mild calcification of the mitral  valve leaflet(s). Moderate mitral annular calcification. Mild mitral valve  regurgitation.   Tricuspid Valve: The tricuspid valve is grossly normal. Tricuspid valve  regurgitation is trivial.   Aortic Valve: The aortic valve has an indeterminant number of cusps. There  is severe calcifcation of the aortic valve. Aortic valve regurgitation is  not visualized. Severe aortic stenosis is present. Aortic valve mean  gradient measures 65.7 mmHg. Aortic  valve peak gradient measures 95.8 mmHg. Aortic valve area, by VTI measures  0.24 cm.   Pulmonic Valve: The pulmonic valve was grossly normal. Pulmonic valve  regurgitation is mild.   Aorta: The aortic root is normal in size and structure.   Venous: The inferior vena cava is normal in size with less than 50%  respiratory variability, suggesting right atrial pressure of 8 mmHg.   IAS/Shunts: No atrial level shunt detected by color flow Doppler.     LEFT VENTRICLE  PLAX 2D  LVIDd:     5.57 cm Diastology  LVIDs:     4.63 cm LV e' medial:  3.81 cm/s  LV PW:     1.24 cm LV E/e' medial: 29.7  LV IVS:    1.25 cm LV e' lateral:  4.95 cm/s  LVOT diam:   1.90 cm LV E/e' lateral: 22.8  LV SV:     28  LV SV Index:  12  LVOT Area:   2.84 cm     RIGHT VENTRICLE  RV S prime:   10.10 cm/s  TAPSE (M-mode): 2.4 cm   LEFT ATRIUM       Index    RIGHT ATRIUM      Index  LA diam:    4.80 cm 2.04 cm/m RA Area:   14.60 cm  LA Vol (A2C):  85.4 ml 36.35 ml/m RA Volume:  40.90 ml 17.41 ml/m  LA Vol (A4C):  69.9 ml 29.75 ml/m  LA Biplane Vol: 83.4 ml 35.50 ml/m  AORTIC VALVE  AV Area (Vmax):  0.27 cm  AV Area (Vmean):  0.24 cm  AV Area (VTI):   0.24 cm  AV Vmax:      489.33 cm/s  AV Vmean:     386.667 cm/s  AV VTI:      1.163 m   AV Peak Grad:   95.8 mmHg  AV Mean Grad:   65.7 mmHg  LVOT Vmax:     46.97 cm/s  LVOT Vmean:    33.167 cm/s  LVOT VTI:     0.099 m  LVOT/AV VTI ratio: 0.09    AORTA  Ao Root diam: 3.10 cm   MITRAL VALVE  MV Area (PHT): 3.13 cm   SHUNTS  MV Decel Time: 242 msec   Systemic VTI: 0.10 m  MR Peak grad: 105.3 mmHg  Systemic Diam: 1.90 cm  MR Mean grad: 68.0 mmHg  MR Vmax:   513.00 cm/s  MR Vmean:   379.0 cm/s  MV E velocity: 113.00 cm/s  MV A velocity: 79.30 cm/s  MV E/A ratio: 1.42   Rozann Lesches MD  Electronically signed by Rozann Lesches MD  Signature Date/Time: 09/12/2020/2:29:55 PM     Laboratory Data:  High Sensitivity Troponin:   Recent Labs  Lab 09/11/20 1750 09/11/20 1759 09/12/20 0027 09/12/20 0708 09/12/20 1214  TROPONINIHS 211* 207* 231* 248* 252*     Chemistry Recent Labs  Lab 09/11/20 1528 09/12/20 0708 09/13/20 0736  NA 132* 134* 131*  K 4.7 4.5 4.2  CL 104 104 98  CO2 21* 21* 24  GLUCOSE 117* 137* 115*  BUN 44* 46* 46*  CREATININE 1.67* 1.68* 1.82*  CALCIUM 8.7* 8.6* 8.5*  GFRNONAA 41* 41* 37*  ANIONGAP _0 Recent Labs  Lab 09/11/20 1528 09/12/20  0708 09/13/20 0736  PROT 7.7 6.9 6.9  ALBUMIN 3.6 3.2* 3.1*  AST _0 ALT _1 ALKPHOS 76 65 63  BILITOT 2.3* 2.4* 2.0*   Hematology Recent Labs  Lab 09/11/20 1528 09/12/20 0708  WBC 13.2* 10.0  RBC 5.81 5.36  HGB 14.2 12.9*  HCT 47.2 43.4  MCV 81.2 81.0  MCH 24.4* 24.1*  MCHC 30.1 29.7*  RDW 15.7* 15.0  PLT 214 203   BNP Recent Labs  Lab 09/11/20 1600 09/12/20 0708  BNP 1,413.0* 1,561.0*    DDimer No results for input(s): DDIMER in the last 168 hours.   Radiology/Studies:  DG Chest 2 View  Result Date: 09/11/2020 CLINICAL DATA:  Sepsis EXAM: CHEST - 2 VIEW COMPARISON:  June 19, 2020 FINDINGS: There are diffuse bilateral airspace opacities. The lung volumes are low. The heart size is enlarged. There are bilateral  pleural effusions, right greater than left. No pneumothorax or acute osseous abnormality. There is vascular congestion with interstitial edema. IMPRESSION: 1. Cardiomegaly with findings concerning for congestive heart failure. 2. Low lung volumes with small bilateral pleural effusions. Electronically Signed   By: Constance Holster M.D.   On: 09/11/2020 16:38   US Carotid Bilateral  Result Date: 09/13/2020 CLINICAL DATA:  Preop CHF Hypertension Diabetes EXAM: BILATERAL CAROTID DUPLEX ULTRASOUND TECHNIQUE: Pearline Cables scale imaging, color Doppler and duplex ultrasound were performed of bilateral carotid and vertebral arteries in the neck. COMPARISON:  None. FINDINGS: Criteria: Quantification of carotid stenosis is based on velocity parameters that correlate the residual internal carotid diameter with NASCET-based stenosis levels, using the diameter of the distal internal carotid lumen as the denominator for stenosis measurement. The following velocity measurements were obtained: RIGHT ICA: 48/17 cm/sec CCA: 84/1 cm/sec SYSTOLIC ICA/CCA RATIO:  0.9 ECA: 54 cm/sec LEFT ICA: 44/8 cm/sec CCA: 66/06 cm/sec SYSTOLIC ICA/CCA RATIO:  0.7 ECA: 50 cm/sec RIGHT CAROTID ARTERY: Mild heterogeneous plaque present in the carotid bulb without high-grade stenosis. RIGHT VERTEBRAL ARTERY:  Antegrade flow. LEFT CAROTID ARTERY: No significant plaque. No significant stenosis. LEFT VERTEBRAL ARTERY:  Antegrade flow. IMPRESSION: Significant stenosis of internal carotid arteries. Electronically Signed   By: Miachel Roux M.D.   On: 09/13/2020 11:03   ECHOCARDIOGRAM COMPLETE  Result Date: 09/12/2020    ECHOCARDIOGRAM REPORT   Patient Name:   KOLBE DELMONACO Date of Exam: 09/12/2020 Medical Rec #:  [id number]    Height:       71.0 in Accession #:    [id number]   Weight:       258.0 lb Date of Birth:  01/23/1941     BSA:          2.350 m Patient Age:    21 years     BP:           107/77 mmHg Patient Gender: M            HR:           85 bpm. Exam  Location:  [facility] Procedure: 2D Echo Indications:    Congestive Heart Failure I50.9  History:        Patient has prior history of Echocardiogram examinations, most                 recent 06/20/2020. CHF; Risk Factors:Hypertension, Diabetes and                 Non-Smoker. Elevated Troponin.  Sonographer:    [name] RDCS (AE) Referring Phys: [phone]  JEHANZEB MEMON IMPRESSIONS  1. Left ventricular ejection fraction, by estimation, is 35 to 40%. The left ventricle has moderately decreased function. The left ventricle demonstrates regional wall motion abnormalities (see scoring diagram/findings for description). There is mild left ventricular hypertrophy. Left ventricular diastolic parameters are consistent with Grade II diastolic dysfunction (pseudonormalization).  2. Right ventricular systolic function is severely reduced. The right ventricular size is normal. Tricuspid regurgitation signal is inadequate for assessing PA pressure.  3. The mitral valve is abnormal. Mild mitral valve regurgitation. Moderate mitral annular calcification.  4. The aortic valve has an indeterminant number of cusps. There is severe calcifcation of the aortic valve. Aortic valve regurgitation is not visualized. Severe-critical aortic valve stenosis. Aortic valve mean gradient measures 65.7 mmHg. Aortic valve Vmax measures 4.89 m/s.  5. The inferior vena cava is normal in size with <50% respiratory variability, suggesting right atrial pressure of 8 mmHg. FINDINGS  Left Ventricle: Left ventricular ejection fraction, by estimation, is 35 to 40%. The left ventricle has moderately decreased function. The left ventricle demonstrates regional wall motion abnormalities. The left ventricular internal cavity size was normal in size. There is mild left ventricular hypertrophy. Left ventricular diastolic parameters are consistent with Grade II diastolic dysfunction (pseudonormalization).  LV Wall Scoring: The anterior wall, entire lateral wall,  and basal inferior segment are akinetic. The mid and distal anterior septum, apical anterior segment, basal inferoseptal segment, and apex are hypokinetic. The mid and distal inferior wall, basal anteroseptal segment, and mid inferoseptal segment are normal. Right Ventricle: The right ventricular size is normal. No increase in right ventricular wall thickness. Right ventricular systolic function is severely reduced. Tricuspid regurgitation signal is inadequate for assessing PA pressure. Left Atrium: Left atrial size was normal in size. Right Atrium: Right atrial size was normal in size. Pericardium: There is no evidence of pericardial effusion. Mitral Valve: The mitral valve is abnormal. There is mild thickening of the mitral valve leaflet(s). There is mild calcification of the mitral valve leaflet(s). Moderate mitral annular calcification. Mild mitral valve regurgitation. Tricuspid Valve: The tricuspid valve is grossly normal. Tricuspid valve regurgitation is trivial. Aortic Valve: The aortic valve has an indeterminant number of cusps. There is severe calcifcation of the aortic valve. Aortic valve regurgitation is not visualized. Severe aortic stenosis is present. Aortic valve mean gradient measures 65.7 mmHg. Aortic valve peak gradient measures 95.8 mmHg. Aortic valve area, by VTI measures 0.24 cm. Pulmonic Valve: The pulmonic valve was grossly normal. Pulmonic valve regurgitation is mild. Aorta: The aortic root is normal in size and structure. Venous: The inferior vena cava is normal in size with less than 50% respiratory variability, suggesting right atrial pressure of 8 mmHg. IAS/Shunts: No atrial level shunt detected by color flow Doppler.  LEFT VENTRICLE PLAX 2D LVIDd:         5.57 cm  Diastology LVIDs:         4.63 cm  LV e' medial:    3.81 cm/s LV PW:         1.24 cm  LV E/e' medial:  29.7 LV IVS:        1.25 cm  LV e' lateral:   4.95 cm/s LVOT diam:     1.90 cm  LV E/e' lateral: 22.8 LV SV:         28 LV  SV Index:   12 LVOT Area:     2.84 cm  RIGHT VENTRICLE RV S prime:     10.10 cm/s TAPSE (M-mode): 2.4 cm  LEFT ATRIUM             Index       RIGHT ATRIUM           Index LA diam:        4.80 cm 2.04 cm/m  RA Area:     14.60 cm LA Vol (A2C):   85.4 ml 36.35 ml/m RA Volume:   40.90 ml  17.41 ml/m LA Vol (A4C):   69.9 ml 29.75 ml/m LA Biplane Vol: 83.4 ml 35.50 ml/m  AORTIC VALVE AV Area (Vmax):    0.27 cm AV Area (Vmean):   0.24 cm AV Area (VTI):     0.24 cm AV Vmax:           489.33 cm/s AV Vmean:          386.667 cm/s AV VTI:            1.163 m AV Peak Grad:      95.8 mmHg AV Mean Grad:      65.7 mmHg LVOT Vmax:         46.97 cm/s LVOT Vmean:        33.167 cm/s LVOT VTI:          0.099 m LVOT/AV VTI ratio: 0.09  AORTA Ao Root diam: 3.10 cm MITRAL VALVE MV Area (PHT): 3.13 cm     SHUNTS MV Decel Time: 242 msec     Systemic VTI:  0.10 m MR Peak grad: 105.3 mmHg    Systemic Diam: 1.90 cm MR Mean grad: 68.0 mmHg MR Vmax:      513.00 cm/s MR Vmean:     379.0 cm/s MV E velocity: 113.00 cm/s MV A velocity: 79.30 cm/s MV E/A ratio:  1.42 Rozann Lesches MD Electronically signed by Rozann Lesches MD Signature Date/Time: 09/12/2020/2:29:55 PM    Final      STS Risk Calculator: Procedure: Isolated AVR  Risk of Mortality:  3.569%  Renal Failure:  6.783%  Permanent Stroke:  1.569%  Prolonged Ventilation:  15.834%  DSW Infection:  0.169%  Reoperation:  5.399%  Morbidity or Mortality:  24.385%  Short Length of Stay:  22.472%  Long Length of Stay:  9.486%   _______________________  [facility] Cardiomyopathy Questionnaire  KCCQ-12 09/13/2020  1 a. Ability to shower/bathe Extremely limited  1 b. Ability to walk 1 block Other, Did not do  1 c. Ability to hurry/jog Other, Did not do  2. Edema feet/ankles/legs Every morning  3. Limited by fatigue All of the time  4. Limited by dyspnea All of the time  5. Sitting up / on 3+ pillows Every night  6. Limited enjoyment of life Extremely limited   7. Rest of life w/ symptoms Not at all satisfied  8 a. Participation in hobbies Severely limited  8 b. Participation in chores Severely limited  8 c. Visiting family/friends Severely limited      Assessment and Plan:   Brett Williamson is a 80 y.o. male with symptoms of severe, stage D1 critical aortic stenosis with NYHA Class IV symptoms currently admitted for acute CHF. I have reviewed the patient's recent echocardiogram which is notable for reduced LV systolic function (EF 40-08%)  and critical aortic stenosis with peak gradient of 95.8 mm hg and mean transvalvular gradient of 65.7. The patient's dimensionless index is 0.09 and calculated aortic valve area is 0.24 cm.    I have reviewed the natural history of aortic stenosis with the patient. We have discussed  the limitations of medical therapy and the poor prognosis associated with symptomatic aortic stenosis. We have reviewed potential treatment options, including palliative medical therapy, conventional surgical aortic valve replacement, and transcatheter aortic valve replacement. We discussed treatment options in the context of this patient's specific comorbid medical conditions.    The patient's predicted risk of mortality with conventional aortic valve replacement is 3.569% primarily based on age, CHF with reduced EF, HTN DMT2, obesity,CKD stage III. Other significant comorbid conditions include RV dysfunction and physical deconditioing. TAVR seems like a reasonable treatment option for this patient pending formal cardiac surgical consultation. We discussed typical evaluation which will require a gated cardiac CTA and a CTA of the chest/abdomen/pelvis to evaluate both his cardiac anatomy and peripheral vasculature. Follow-up testing will be arranged in the inpatient as renal function will allow. Will try to get him diuresed to the point where we can do a L/RHC, followed by staged CT scans. This will be diffucult given worsening renal function  and soft BPs.   Dr. Burt Knack to follow.    New York Heart Association (NYHA) Functional Class NYHA Class IV    For questions or updates, please contact [facility] Please consult [email or web address] for contact info under    Signed, Angelena Form, PA-C  09/13/2020 4:44 PM  Patient seen, examined. Available data reviewed. Agree with findings, assessment, and plan as outlined by Nell Range, PA-C.  The patient is independently interviewed and examined.  On my exam, he is an alert, oriented, elderly male in no distress.  He complains of shortness of breath today.  He does not exhibit signs of increased work of breathing.  The patient is edentulous, otherwise HEENT exam is normal.  JVP is elevated to the angle of the mandible.  Carotid upstrokes are diminished with no bruits.  Lungs exhibit bilateral rales in both lower lung fields.  Cardiovascular: The apical impulse is nonpalpable.  Heart sounds are very distant.  There is a distant sounding harsh crescendo decrescendo murmur only heard at the apex.  Abdomen is soft, obese, nontender.  Bilateral groins have diffuse fungal rash.  The lower legs are wrapped in Unna boots.  I have personally reviewed the patient's echo images.  He has a critical aortic stenosis with severe calcification and restriction of all 3 aortic valve leaflets.  The transaortic mean gradient is 66 mmHg, peak transaortic velocity is 4.9 m/s.  The patient has severe LV systolic dysfunction.  His LV dysfunction is segmental and he has the appearance of severe hypokinesis/akinesis of much of the anterolateral wall.  LVEF is estimated at 35 to 40%.  The patient also has severe RV dysfunction.  I have reviewed lab and imaging studies as well.  Chest x-ray shows cardiomegaly with diffuse airspace opacities and bilateral pleural effusions consistent with vascular congestion, interstitial edema, and congestive heart failure.BNP is markedly elevated at 1561.  Troponin is flat with low-level  elevation at 248 and 252.  Creatinine has increased from 1.68-1 0.82-2.18 today with diuresis.  I am concerned that he has low output in the setting of critical aortic stenosis and biventricular dysfunction as he remains hypotensive with increasing creatinine on diuretic therapy.  Ultimately, he will require treatment of his critical aortic stenosis if we are going to pursue aggressive therapy.  However, I do not think he is stable to proceed with preoperative testing such as right and left heart catheterization or CT angiography studies as he is at exceedingly high risk of contrast-induced nephropathy with decompensated heart  failure and acute on chronic kidney injury.  I am going to ask the advanced heart failure team to see him in consultation.  In the meantime, will place a dual-lumen PICC line and start him on low-dose dobutamine to augment diuresis.  We will repeat a chest x-ray.  Need to obtain daily weights and initiate standard heart failure care as indicated.  We will start him on fluconazole for his fungal groin infection.  We will update the patient's family.  If things go well and he improves clinically, would anticipate right and left heart catheterization early next week followed by CT angiography studies if indicated.  I think there is a high likelihood that he is going to have multivessel coronary artery disease based on the segmental nature of his LV dysfunction.  Plan for today:  Place PICC  Start dobutamine 2.5 mcg/kg/min  Stop bisoprolol  Repeat CXR  Check co-ox once PICC placed and daily  Follow creatinine daily  Proceed with R/L heart cath Monday depending clinical response to therapies above  Obtain Advanced HF consult  Treat groin infection with fluconazole  Sherren Mocha, M.D. 09/14/2020 11:11 AM

## 2020-09-14 ENCOUNTER — Inpatient Hospital Stay: Payer: Self-pay

## 2020-09-14 ENCOUNTER — Inpatient Hospital Stay (HOSPITAL_COMMUNITY): Payer: Medicare Other

## 2020-09-14 DIAGNOSIS — I5033 Acute on chronic diastolic (congestive) heart failure: Secondary | ICD-10-CM

## 2020-09-14 DIAGNOSIS — I350 Nonrheumatic aortic (valve) stenosis: Secondary | ICD-10-CM | POA: Diagnosis not present

## 2020-09-14 DIAGNOSIS — I5023 Acute on chronic systolic (congestive) heart failure: Secondary | ICD-10-CM

## 2020-09-14 DIAGNOSIS — Z006 Encounter for examination for normal comparison and control in clinical research program: Secondary | ICD-10-CM | POA: Diagnosis not present

## 2020-09-14 DIAGNOSIS — I5043 Acute on chronic combined systolic (congestive) and diastolic (congestive) heart failure: Secondary | ICD-10-CM | POA: Diagnosis not present

## 2020-09-14 DIAGNOSIS — I130 Hypertensive heart and chronic kidney disease with heart failure and stage 1 through stage 4 chronic kidney disease, or unspecified chronic kidney disease: Secondary | ICD-10-CM | POA: Diagnosis not present

## 2020-09-14 LAB — GLUCOSE, CAPILLARY
Glucose-Capillary: 143 mg/dL — ABNORMAL HIGH (ref 70–99)
Glucose-Capillary: 161 mg/dL — ABNORMAL HIGH (ref 70–99)
Glucose-Capillary: 122 mg/dL — ABNORMAL HIGH (ref 70–99)
Glucose-Capillary: 137 mg/dL — ABNORMAL HIGH (ref 70–99)
Glucose-Capillary: 125 mg/dL — ABNORMAL HIGH (ref 70–99)

## 2020-09-14 LAB — COMPREHENSIVE METABOLIC PANEL
ALT: 17 U/L (ref 0–44)
AST: 17 U/L (ref 15–41)
Albumin: 3 g/dL — ABNORMAL LOW (ref 3.5–5.0)
Alkaline Phosphatase: 67 U/L (ref 38–126)
Anion gap: 12 (ref 5–15)
BUN: 45 mg/dL — ABNORMAL HIGH (ref 8–23)
CO2: 23 mmol/L (ref 22–32)
Calcium: 8.5 mg/dL — ABNORMAL LOW (ref 8.9–10.3)
Chloride: 97 mmol/L — ABNORMAL LOW (ref 98–111)
Creatinine, Ser: 2.18 mg/dL — ABNORMAL HIGH (ref 0.61–1.24)
GFR, Estimated: 30 mL/min — ABNORMAL LOW (ref >60)
Glucose, Bld: 138 mg/dL — ABNORMAL HIGH (ref 70–99)
Potassium: 4.6 mmol/L (ref 3.5–5.1)
Sodium: 132 mmol/L — ABNORMAL LOW (ref 135–145)
Total Bilirubin: 2.1 mg/dL — ABNORMAL HIGH (ref 0.3–1.2)
Total Protein: 7.3 g/dL (ref 6.5–8.1)

## 2020-09-14 LAB — BLOOD GAS, VENOUS
Acid-Base Excess: 1.2 mmol/L (ref 0.0–2.0)
Bicarbonate: 26.5 mmol/L (ref 20.0–28.0)
Drawn by: 214122
FIO2: 28
O2 Saturation: 57.9 %
Patient temperature: 37
pCO2, Ven: 52 mmHg (ref 44.0–60.0)
pH, Ven: 7.329 (ref 7.250–7.430)
pO2, Ven: 35.6 mmHg (ref 32.0–45.0)

## 2020-09-14 LAB — MAGNESIUM: Magnesium: 2.1 mg/dL (ref 1.7–2.4)

## 2020-09-14 LAB — LIPID PANEL
Cholesterol: 116 mg/dL (ref 0–200)
HDL: 26 mg/dL — ABNORMAL LOW (ref >40)
LDL Cholesterol: 74 mg/dL (ref 0–99)
Total CHOL/HDL Ratio: 4.5 RATIO
Triglycerides: 80 mg/dL (ref <150)
VLDL: 16 mg/dL (ref 0–40)

## 2020-09-14 MED ORDER — SODIUM CHLORIDE 0.9% FLUSH
10.0000 mL | Freq: Two times a day (BID) | INTRAVENOUS | Status: DC
  Administered 2020-09-14 – 2020-09-29 (×22): 10 mL

## 2020-09-14 MED ORDER — GERHARDT'S BUTT CREAM
TOPICAL_CREAM | CUTANEOUS | Status: DC | PRN
  Filled 2020-09-14: qty 1

## 2020-09-14 MED ORDER — SODIUM CHLORIDE 0.9% FLUSH: 10.0000 mL | INTRAVENOUS | Status: DC | PRN

## 2020-09-14 MED ORDER — DOBUTAMINE IN D5W 4-5 MG/ML-% IV SOLN
2.5000 ug/kg/min | INTRAVENOUS | Status: DC
  Administered 2020-09-14 – 2020-09-24 (×5): 2.5 ug/kg/min via INTRAVENOUS
  Filled 2020-09-14 (×5): qty 250

## 2020-09-14 MED ORDER — FUROSEMIDE 10 MG/ML IJ SOLN: 60.0000 mg | Freq: Two times a day (BID) | INTRAMUSCULAR | Status: DC

## 2020-09-14 MED ORDER — FLUCONAZOLE 100 MG PO TABS
100.0000 mg | ORAL_TABLET | Freq: Every day | ORAL | Status: AC
  Administered 2020-09-14 – 2020-09-20 (×6): 100 mg via ORAL
  Filled 2020-09-14 (×7): qty 1

## 2020-09-14 MED ORDER — FUROSEMIDE 10 MG/ML IJ SOLN
80.0000 mg | Freq: Two times a day (BID) | INTRAMUSCULAR | Status: DC
  Administered 2020-09-14 – 2020-09-15 (×2): 80 mg via INTRAVENOUS
  Filled 2020-09-14 (×2): qty 8

## 2020-09-14 MED ORDER — ORAL CARE MOUTH RINSE
15.0000 mL | Freq: Two times a day (BID) | OROMUCOSAL | Status: DC
  Administered 2020-09-15 – 2020-09-29 (×13): 15 mL via OROMUCOSAL

## 2020-09-14 NOTE — Significant Event (Signed)
Rapid Response Event Note   Reason for Call :  O2 desaturation Patient admitted with CHF/AS and work up for TAVR  Initial Focused Assessment:  Patient is alert and oriented he is in mild- moderate respiratory distress and he is pale cold and clammy.   Lung sounds decreased bases CBG 122 He is edematous, with unna boots on bilat LE PCXR done about 1130: CHF  BP 96/69  SR 64  RR 26  O2 sat 94% on 30% Venturi mask   Interventions:  12 lead EKG done Repositioned in the bed Dr Burt Knack to bedside to assess patient Dobutamine started via PIV until PICC able to be placed today.  1700 Follow up: He is warm and dry, skin tone is yellow, some redness across forehead.  PICC is placed in left arm.  Placed Dobutamine to PICC.  Plan of Care:  CPap at bedtime RN to call if patient worsens   Event Summary:   MD Notified: Burt Knack Call Time: [place] Time: [place] Time:  [place]  Raliegh Ip, RN

## 2020-09-14 NOTE — Progress Notes (Signed)
  Reevaluated patient.  Now on non venturi mask, dobutamine running peripherally.  BP improved to 101/75.  Alert, answers questions appropriately but falls asleep intermittently.  When asleep has long apneic episodes.  Still taking shallow frequent breaths.    -start back IV lasix at 80mg  BID -check stat VBG to check for CO2 retention -ordered cpap for tonight may need bipap initially based on VBG results

## 2020-09-14 NOTE — Care Management Important Message (Signed)
Important Message  Patient Details  Name: Brett Williamson MRN: [id number] Date of Birth: 13-Dec-1940   Medicare Important Message Given:  Yes     Orbie Pyo 09/14/2020, 1:27 PM

## 2020-09-14 NOTE — Plan of Care (Signed)

## 2020-09-14 NOTE — Progress Notes (Signed)
Peripherally Inserted Central Catheter Placement  The IV Nurse has discussed with the patient and/or persons authorized to consent for the patient, the purpose of this procedure and the potential benefits and risks involved with this procedure.  The benefits include less needle sticks, lab draws from the catheter, and the patient may be discharged home with the catheter. Risks include, but not limited to, infection, bleeding, blood clot (thrombus formation), and puncture of an artery; nerve damage and irregular heartbeat and possibility to perform a PICC exchange if needed/ordered by physician.  Alternatives to this procedure were also discussed.  Bard Power PICC patient education guide, fact sheet on infection prevention and patient information card has been provided to patient /or left at bedside.    PICC Placement Documentation  PICC Double Lumen 91/47/82 PICC Left Basilic 47 cm 0 cm (Active)  Indication for Insertion or Continuance of Line Vasoactive infusions 09/14/20 1600  Exposed Catheter (cm) 0 cm 09/14/20 1600  Site Assessment Clean;Dry;Intact 09/14/20 1600  Lumen #1 Status Flushed;Blood return noted 09/14/20 1600  Lumen #2 Status Flushed;Blood return noted 09/14/20 1600  Dressing Type Transparent 09/14/20 1600  Dressing Status Clean;Dry;Intact 09/14/20 1600  Antimicrobial disc in place? Yes 09/14/20 1600  Dressing Intervention New dressing 09/14/20 1600  Dressing Change Due 09/21/20 09/14/20 1600       [name] Horton 09/14/2020, 4:36 PM

## 2020-09-14 NOTE — Progress Notes (Signed)
   Received page from RN. Dobutamine was started at 1500 and now patient has yellowing of arms and cheeks as well as a mild papular erythematous rash on his forehead that was not there before. I went to bedside to see patient. He is currently on Venturi Mask and states he is breathing better since starting the Dobutamine. He is not in any pain and denies any itching. RN also states he looks like he is breathing much better since Dobutamine was started. Systolic BP soft tin the low 100's but much better before. Discussed with Dr. Burt Knack who recommended continuing Dobutamine at this time. We can recheck liver function tests in the morning. RN to notify us if rash/jaundice worsens.  Darreld Mclean, PA-C 09/14/2020 6:51 PM

## 2020-09-14 NOTE — Consult Note (Addendum)
Advanced Heart Failure Team Consult Note   Primary Physician:Hasanj, xaje Primary Cardiologist:  Dr. Domenic Polite  Reason for Consultation: Severe Aortic Stenosis, Acute on chronic combined systolic/diastolic CHF   HPI:    Brett Williamson is seen today for evaluation of acute on chronic systolic CHF in the setting of critical AS at the request of Dr. Burt Knack.   80 year old male w/PMH of chronic diastolic CHF, Severe AS, subdural hematoma, HTN, T2DM.  Patient has been followed primarily by his PCP over the years.  Was admitted for a subdural hematoma and left hemiparesis after a mechanical fall that occurred on 06/2020.   This required right craniotomy, he recovered from this and was transferred to inpatient rehab. During his workup he had an ECHO which showed Severe Aortic Stenosis with valve area 0.57 cm2 and mean gradient of 51.52mmHg but at that time with normal EF, G2DD and no signs of volume overload.    Discharged from rehab on 07/07/20 and was doing okay.  On 2/1 he presented to [facility] with history of worsening dyspnea and LE edema, bilateral interstitial edema on chest radiograph.  He was started on IV lasix and an echo was repeated with showed new lowering of his EF down to 35-40%, worsening of AVA to 0.24cm2 and mean gradient now 65.70mmHg. He had a poor response to diuresis with worsening renal function and was transferred to [facility] for further management.    Multidisciplinary heart valve team consulted.  Given age and comorbidities they will consider TAVR implantation if he is stable enough to do so.    Patient reports he began showing signs of worsening volume overload around 4 weeks ago mainly in lower extremities then a week later began to have worsening shortness of breath and decreased exercise capacity.  He denies any chest pain, he is having some dyspnea at rest along with orthopnea.      Review of Systems: [y] = yes, [ ]  = no   General: Weight gain [x ]; Weight loss [  ]; Anorexia [x ]; Fatigue [ ] ; Fever [ ] ; Chills [ ] ; Weakness [x ]  Cardiac: Chest pain/pressure [ ] ; Resting SOB [x ]; Exertional SOB [x] ; Orthopnea [ ] ; Pedal Edema [x ]; Palpitations [ ] ; Syncope [ ] ; Presyncope [ ] ; Paroxysmal nocturnal dyspnea[ ]   Pulmonary: Cough [x ]; Wheezing[ ] ; Hemoptysis[ ] ; Sputum [ ] ; Snoring [ ]   GI: Vomiting[ ] ; Dysphagia[ ] ; Melena[ ] ; Hematochezia [ ] ; Heartburn[ ] ; Abdominal pain [ ] ; Constipation [ ] ; Diarrhea [ ] ; BRBPR [ ]   GU: Hematuria[ ] ; Dysuria [ ] ; Nocturia[ ]   Vascular: Pain in legs with walking [ ] ; Pain in feet with lying flat [x ]; Non-healing sores [ ] ; Stroke [ ] ; TIA [ ] ; Slurred speech [ ] ;  Neuro: Headaches[ ] ; Vertigo[ ] ; Seizures[ ] ; Paresthesias[ ] ;Blurred vision [ ] ; Diplopia [ ] ; Vision changes [ ]   Ortho/Skin: Arthritis [ y]; Joint pain [ y]; Muscle pain [ ] ; Joint swelling [ ] ; Back Pain [ ] ; Rash [ ]   Psych: Depression[ ] ; Anxiety[ ]   Heme: Bleeding problems [ ] ; Clotting disorders [ ] ; Anemia [ ]   Endocrine: Diabetes [ ] ; Thyroid dysfunction[ ]   Home Medications Prior to Admission medications   Medication Sig Start Date End Date Taking? Authorizing Provider  acetaminophen (TYLENOL) 325 MG tablet Take 650 mg by mouth every 6 (six) hours as needed.   Yes [provider]  traMADol (ULTRAM) 50 MG tablet Take  50 mg by mouth 2 (two) times daily as needed. 07/19/20  Yes [provider]  triamcinolone (KENALOG) 0.1 % Apply 1 application topically See admin instructions. Apply to affected area 2 to 3 times a week 07/12/20  Yes [provider]    Past Medical History: Past Medical History:  Diagnosis Date  . Chronic combined systolic and diastolic CHF (congestive heart failure) ([place])   . Chronic kidney disease (CKD), stage III (moderate) ([facility])   . Diabetes ([place])   . Frequent falls   . HTN (hypertension)   . Morbid obesity ([place])   . RBBB   . Severe aortic stenosis   . Subdural hematoma ([place]) 06/2020   after a  mechanical fall    Past Surgical History: Past Surgical History:  Procedure Laterality Date  . CRANIOTOMY Right 06/19/2020   Procedure: CRANIOTOMY HEMATOMA EVACUATION SUBDURAL;  Surgeon: Erline Levine, MD;  Location: [facility];  Service: Neurosurgery;  Laterality: Right;  . ROTATOR CUFF REPAIR      Family History: Family History  Problem Relation Age of Onset  . Heart attack Father     Social History: Social History   Socioeconomic History  . Marital status: Widowed    Spouse name: Not on file  . Number of children: Not on file  . Years of education: Not on file  . Highest education level: Not on file  Occupational History  . Not on file  Tobacco Use  . Smoking status: Never Smoker  . Smokeless tobacco: Never Used  Vaping Use  . Vaping Use: Never used  Substance and Sexual Activity  . Alcohol use: Never  . Drug use: Never  . Sexual activity: Not Currently  Other Topics Concern  . Not on file  Social History Narrative  . Not on file   Social Determinants of Health   Financial Resource Strain: Not on file  Food Insecurity: Not on file  Transportation Needs: Not on file  Physical Activity: Not on file  Stress: Not on file  Social Connections: Not on file    Allergies:  No Known Allergies  Objective:    Vital Signs:   Temp:  [98.4 F (36.9 C)-99 F (37.2 C)] 98.8 F (37.1 C) (02/04 1100) Pulse Rate:  [67-80] 70 (02/04 1200) Resp:  [16-25] 17 (02/04 1200) BP: (81-106)/(63-81) 97/75 (02/04 1200) SpO2:  [93 %-100 %] 93 % (02/04 1200) Last BM Date: 09/09/20  Weight change: Filed Weights   09/11/20 1525  Weight: 117 kg    Intake/Output:   Intake/Output Summary (Last 24 hours) at 09/14/2020 1319 Last data filed at 09/14/2020 1200 Gross per 24 hour  Intake 640 ml  Output 950 ml  Net -310 ml      Physical Exam    Cardiac: JVD to jaw, normal rate and rhythm, 3/6 AS with diminished s2, no rubs or gallops, legs wrapped in unna boots, warm  extremities Pulmonary: shallow breaths, no wheezing, not in distress Abdominal: non distended abdomen, soft and nontender Psych: Alert, conversant, in good spirits   Telemetry   NSR 60's, RBBB  EKG    n/a  Labs   Basic Metabolic Panel: Recent Labs  Lab 09/11/20 1528 09/12/20 0708 09/13/20 0736 09/14/20 0127  NA 132* 134* 131* 132*  K 4.7 4.5 4.2 4.6  CL 104 104 98 97*  CO2 21* 21* 24 23  GLUCOSE 117* 137* 115* 138*  BUN 44* 46* 46* 45*  CREATININE 1.67* 1.68* 1.82* 2.18*  CALCIUM 8.7* 8.6* 8.5*  8.5*  MG  --  2.1 2.0 2.1    Liver Function Tests: Recent Labs  Lab 09/11/20 1528 09/12/20 0708 09/13/20 0736 09/14/20 0127  AST 22 18 17 17   ALT 26 21 19 17   ALKPHOS 76 65 63 67  BILITOT 2.3* 2.4* 2.0* 2.1*  PROT 7.7 6.9 6.9 7.3  ALBUMIN 3.6 3.2* 3.1* 3.0*   No results for input(s): LIPASE, AMYLASE in the last 168 hours. No results for input(s): AMMONIA in the last 168 hours.  CBC: Recent Labs  Lab 09/11/20 1528 09/12/20 0708  WBC 13.2* 10.0  NEUTROABS 9.8* 7.4  HGB 14.2 12.9*  HCT 47.2 43.4  MCV 81.2 81.0  PLT 214 203    Cardiac Enzymes: No results for input(s): CKTOTAL, CKMB, CKMBINDEX, TROPONINI in the last 168 hours.  BNP: BNP (last 3 results) Recent Labs    09/11/20 1600 09/12/20 0708  BNP 1,413.0* 1,561.0*    ProBNP (last 3 results) No results for input(s): PROBNP in the last 8760 hours.   CBG: Recent Labs  Lab 09/13/20 1248 09/13/20 1632 09/13/20 2125 09/14/20 0632 09/14/20 1113  GLUCAP 160* 143* 149* 161* 122*    Coagulation Studies: Recent Labs    09/11/20 1600  LABPROT 14.9  INR 1.2     Imaging   DG Chest Port 1 View  Result Date: 09/14/2020 CLINICAL DATA:  Progressively worsening shortness of breath and bilateral leg swelling. Vomiting. Hypotension. EXAM: PORTABLE CHEST 1 VIEW COMPARISON:  09/11/2020. FINDINGS: Trachea is midline. Heart is enlarged. Lungs are somewhat low in volume with mixed interstitial and  airspace opacification, similar to minimally worsened from 09/11/2020. There may be small bilateral pleural effusions. IMPRESSION: Congestive heart failure. Electronically Signed   By: Lorin Picket M.D.   On: 09/14/2020 11:41   [place] EKG SITE RITE  Result Date: 09/14/2020 If Site Rite image not attached, placement could not be confirmed due to current cardiac rhythm.     Medications:     Current Medications: . aspirin EC  81 mg Oral Daily  . Chlorhexidine Gluconate Cloth  6 each Topical Daily  . fluconazole  100 mg Oral Daily  . furosemide  40 mg Intravenous BID  . insulin aspart  0-15 Units Subcutaneous TID WC  . insulin aspart  0-5 Units Subcutaneous QHS  . nystatin   Topical TID  . sodium chloride flush  3 mL Intravenous Q12H     Infusions: . sodium chloride 10 mL/hr at 09/13/20 1000  . DOBUTamine          Assessment/Plan    Acute on chronic combined systolic and diastolic CHF: -ECHO 99991111 with normal EF, severe aortic stenosis  AVA 0.57 cm2 and mean gradient of 51.62mmHg G2DD -ECHO 2/2 with EF down to 35-40%, worsening of AVA to 0.24cm2 and mean gradient now 65.34mmHg, new RWMA  -Likely combination of worsening AS, chronic HTN, almost certainly an ischemic component as well -PICC line and dobutamine ordered, will get coox and trend, CVP monitoring -Need to improve cardiac output/BP try to decongest -continue holding bb -Will wait for dobutamine and see response before adding further lasix -Will need eventual R/LHC   Severe Symptomatic Aortic Stenosis: -critical stenosis, significantly progressed since 06/2020 -worsening of AVA to 0.24cm2 and mean gradient now 65.23mmHg -will try to optimize patient so he can complete workup for possible TAVR  AKI on CKD 3b: -baseline cr around 1.6 -up to 2.18 but BUN stable -will try to improve with inotropic support and decongestion  Length  of Stay: 3  Katherine Roan, MD  09/14/2020, 1:19 PM  Advanced Heart Failure  Team Pager [phone] (M-F; [place])  Please contact [facility] for night-coverage after hours (4p -7a ) and weekends on [email or web address]  Agree with above.    80 y/o with systolic HF and end-stage aortic stenosis admitted with low output HF and AKI.   In 11/21 had fall with SDH with L hemiparesis requiring evacuation. Was initially doing well after his hospitalization in 06/2020 but over the past 3-4 weeks, he started to experience worsening dyspnea on exertion and lower extremity edema. No associated chest pain or palpitations.   Admitted to [facility] and transferred here. Echocardiogram was obtained and shows his EF is now reduced at 35-40% with Grade 2 DD, severely reduced RV function, mild MR and severe/critical AS with mean gradient measuring 65.7 mmHg.   Dobutamine stated earlier today. Patient on Venturi mask. Feels breathing is some better.  Awaiting PICC line   General:  Elderly male. Pale on venturi mask HEENT: normal Neck: supple. JVP to ear Carotids 2+ bilat; + bruits. No lymphadenopathy or thryomegaly appreciated. Cor: PMI nondisplaced. Regular rate & rhythm. 3/6 AS s2 markedly diminished  Lungs: clear Abdomen: soft, nontender, nondistended. No hepatosplenomegaly. No bruits or masses. Good bowel sounds. Extremities: no cyanosis, clubbing, rash, 1+ edema cool +UNNA boots  + groin candidiasis Neuro: alert & orientedx3, cranial nerves grossly intact. moves all 4 extremities w/o difficulty. Affect pleasant  He is critically ill with low output HF in setting of EF 35% and critical AS. Agree with dobutamine. Start at 2.5. Will place PICC. Suspect we will need to titrate to 5. Continue attempts at diuresis.   If we can stabilize will need R/L cath next week with eye toward possible TAVR.  Degree of RV dysfunction is concerning.   D/w Dr. Burt Knack.   CRITICAL CARE Performed by: Glori Bickers  Total critical care time: 35 minutes  Critical care time was exclusive of separately billable  procedures and treating other patients.  Critical care was necessary to treat or prevent imminent or life-threatening deterioration.  Critical care was time spent personally by me (independent of midlevel providers or residents) on the following activities: development of treatment plan with patient and/or surrogate as well as nursing, discussions with consultants, evaluation of patient's response to treatment, examination of patient, obtaining history from patient or surrogate, ordering and performing treatments and interventions, ordering and review of laboratory studies, ordering and review of radiographic studies, pulse oximetry and re-evaluation of patient's condition.   Glori Bickers, MD  8:27 PM

## 2020-09-15 ENCOUNTER — Inpatient Hospital Stay (HOSPITAL_COMMUNITY): Payer: Medicare Other

## 2020-09-15 LAB — GLUCOSE, CAPILLARY
Glucose-Capillary: 101 mg/dL — ABNORMAL HIGH (ref 70–99)
Glucose-Capillary: 122 mg/dL — ABNORMAL HIGH (ref 70–99)
Glucose-Capillary: 199 mg/dL — ABNORMAL HIGH (ref 70–99)
Glucose-Capillary: 169 mg/dL — ABNORMAL HIGH (ref 70–99)

## 2020-09-15 LAB — MAGNESIUM: Magnesium: 2.3 mg/dL (ref 1.7–2.4)

## 2020-09-15 LAB — COMPREHENSIVE METABOLIC PANEL
ALT: 16 U/L (ref 0–44)
AST: 17 U/L (ref 15–41)
Albumin: 2.8 g/dL — ABNORMAL LOW (ref 3.5–5.0)
Alkaline Phosphatase: 61 U/L (ref 38–126)
Anion gap: 11 (ref 5–15)
BUN: 60 mg/dL — ABNORMAL HIGH (ref 8–23)
CO2: 24 mmol/L (ref 22–32)
Calcium: 8.2 mg/dL — ABNORMAL LOW (ref 8.9–10.3)
Chloride: 98 mmol/L (ref 98–111)
Creatinine, Ser: 2.36 mg/dL — ABNORMAL HIGH (ref 0.61–1.24)
GFR, Estimated: 27 mL/min — ABNORMAL LOW (ref >60)
Glucose, Bld: 135 mg/dL — ABNORMAL HIGH (ref 70–99)
Potassium: 4.2 mmol/L (ref 3.5–5.1)
Sodium: 133 mmol/L — ABNORMAL LOW (ref 135–145)
Total Bilirubin: 2.2 mg/dL — ABNORMAL HIGH (ref 0.3–1.2)
Total Protein: 6.5 g/dL (ref 6.5–8.1)

## 2020-09-15 LAB — COOXEMETRY PANEL
Carboxyhemoglobin: 1.2 % (ref 0.5–1.5)
Methemoglobin: 1 % (ref 0.0–1.5)
O2 Saturation: 61.9 %
Total hemoglobin: 12.9 g/dL (ref 12.0–16.0)

## 2020-09-15 MED ORDER — HEPARIN SODIUM (PORCINE) 5000 UNIT/ML IJ SOLN: 5000.0000 [IU] | Freq: Three times a day (TID) | INTRAMUSCULAR | Status: DC

## 2020-09-15 NOTE — Plan of Care (Signed)

## 2020-09-15 NOTE — Progress Notes (Signed)
Pt refused cpap for tonight 

## 2020-09-15 NOTE — Progress Notes (Signed)
Patient ID: Brett Williamson, male   DOB: 08/05/1941, 80 y.o.   MRN: [id number]     Advanced Heart Failure Rounding Note  PCP-Cardiologist: No primary care provider on file.   Subjective:    Breathing better today.  He is on 1 L [place].  Co-ox is 62%, CVP on my read is 2-3.  I/Os negative.  Creatinine up 2.18 => 2.36.    Objective:   Weight Range: 117 kg Body mass index is 35.98 kg/m.   Vital Signs:   Temp:  [98 F (36.7 C)-98.8 F (37.1 C)] 98 F (36.7 C) (02/05 0822) Pulse Rate:  [60-75] 75 (02/05 0822) Resp:  [16-29] 17 (02/05 0322) BP: (85-105)/(62-75) 99/62 (02/05 0822) SpO2:  [93 %-100 %] 97 % (02/05 0822) FiO2 (%):  [31 %] 31 % (02/04 1500) Weight:  [117 kg] 117 kg (02/05 0500) Last BM Date: 09/09/20  Weight change: Filed Weights   09/11/20 1525 09/15/20 0500  Weight: 117 kg 117 kg    Intake/Output:   Intake/Output Summary (Last 24 hours) at 09/15/2020 1041 Last data filed at 09/15/2020 0825 Gross per 24 hour  Intake 1066.68 ml  Output 2300 ml  Net -1233.32 ml      Physical Exam    General:  Well appearing. No resp difficulty HEENT: Normal Neck: Supple. JVP not elevated. Carotids 2+ bilat; no bruits. No lymphadenopathy or thyromegaly appreciated. Cor: PMI nondisplaced. Regular rate & rhythm. 3/6 SEM RUSB with muffled S2.  Lungs: Clear Abdomen: Soft, nontender, nondistended. No hepatosplenomegaly. No bruits or masses. Good bowel sounds. Extremities: No cyanosis, clubbing, rash. No edema.  Neuro: Alert & orientedx3, cranial nerves grossly intact. moves all 4 extremities w/o difficulty. Affect pleasant   Telemetry   NSR 80s with PVCs.    Labs    CBC No results for input(s): WBC, NEUTROABS, HGB, HCT, MCV, PLT in the last 72 hours. Basic Metabolic Panel Recent Labs    09/14/20 0127 09/15/20 0500  NA 132* 133*  K 4.6 4.2  CL 97* 98  CO2 23 24  GLUCOSE 138* 135*  BUN 45* 60*  CREATININE 2.18* 2.36*  CALCIUM 8.5* 8.2*  MG 2.1 2.3   Liver Function  Tests Recent Labs    09/14/20 0127 09/15/20 0500  AST 17 17  ALT 17 16  ALKPHOS 67 61  BILITOT 2.1* 2.2*  PROT 7.3 6.5  ALBUMIN 3.0* 2.8*   No results for input(s): LIPASE, AMYLASE in the last 72 hours. Cardiac Enzymes No results for input(s): CKTOTAL, CKMB, CKMBINDEX, TROPONINI in the last 72 hours.  BNP: BNP (last 3 results) Recent Labs    09/11/20 1600 09/12/20 0708  BNP 1,413.0* 1,561.0*    ProBNP (last 3 results) No results for input(s): PROBNP in the last 8760 hours.   D-Dimer No results for input(s): DDIMER in the last 72 hours. Hemoglobin A1C No results for input(s): HGBA1C in the last 72 hours. Fasting Lipid Panel Recent Labs    09/14/20 0127  CHOL 116  HDL 26*  LDLCALC 74  TRIG 80  CHOLHDL 4.5   Thyroid Function Tests No results for input(s): TSH, T4TOTAL, T3FREE, THYROIDAB in the last 72 hours.  Invalid input(s): FREET3  Other results:   Imaging    DG Chest Port 1 View  Result Date: 09/14/2020 CLINICAL DATA:  Progressively worsening shortness of breath and bilateral leg swelling. Vomiting. Hypotension. EXAM: PORTABLE CHEST 1 VIEW COMPARISON:  09/11/2020. FINDINGS: Trachea is midline. Heart is enlarged. Lungs are somewhat low in volume  with mixed interstitial and airspace opacification, similar to minimally worsened from 09/11/2020. There may be small bilateral pleural effusions. IMPRESSION: Congestive heart failure. Electronically Signed   By: Lorin Picket M.D.   On: 09/14/2020 11:41   [place] EKG SITE RITE  Result Date: 09/14/2020 If Site Rite image not attached, placement could not be confirmed due to current cardiac rhythm.     Medications:     Scheduled Medications: . aspirin EC  81 mg Oral Daily  . Chlorhexidine Gluconate Cloth  6 each Topical Daily  . fluconazole  100 mg Oral Daily  . insulin aspart  0-15 Units Subcutaneous TID WC  . insulin aspart  0-5 Units Subcutaneous QHS  . mouth rinse  15 mL Mouth Rinse BID  . nystatin    Topical TID  . sodium chloride flush  10-40 mL Intracatheter Q12H  . sodium chloride flush  3 mL Intravenous Q12H     Infusions: . sodium chloride 10 mL/hr at 09/15/20 0400  . DOBUTamine 2.5 mcg/kg/min (09/15/20 0400)     PRN Medications:  sodium chloride, acetaminophen, Gerhardt's butt cream, ondansetron (ZOFRAN) IV, sodium chloride flush    Assessment/Plan   1. Acute on chronic combined systolic and diastolic CHF:  ECHO 34/9179 with normal EF, severe aortic stenosis  AVA 0.57 cm2 and mean gradient of 51.43mmHg G2DD.  ECHO 09/12/20 with EF down to 35-40%, worsening of AVA to 0.24cm2 and mean gradient now 65.73mmHg, new RWMA.  Likely due to worsening AS but concern for an ischemic component as well.  He was diuresed yesterday and started on dobutamine 2.5.  Today, CVP 3 on my read with co-ox 62%.  BUN/creatinine rising. Breathing better and down to 1L [place] oxygen.  - Continue dobutamine 2.5.  - Stop IV Lasix for now, continue to follow CVP.  - Repeat CXR PA/lateral today.  2. Severe Aortic Stenosis:  Critical stenosis, significantly progressed since 06/2020. Worsening of AVA to 0.24cm2 and mean gradient now 65.69mmHg - Will try to optimize patient so he can complete workup for possible TAVR, Dr Burt Knack following.  - Will need CT scans and right/left heart cath, discuss timing with Dr. Burt Knack.  Will need creatinine to trend down.  3. AKI on CKD 3b: Baseline cr around 1.6.  BUN/creatinine up to 60/2.36.  - Continue dobutamine.  - Hold Lasix.  - po hydration today.  4. Traumatic subdural hematoma: 11/21 with craniotomy/evacuation.  He is on SCDs for now for DVT prophylaxis.   .  Length of Stay: [place], MD  09/15/2020, 10:41 AM  Advanced Heart Failure Team Pager [phone] (M-F; [place])  Please contact [facility] for night-coverage after hours (4p -7a ) and weekends on [email or web address]

## 2020-09-16 DIAGNOSIS — I5043 Acute on chronic combined systolic (congestive) and diastolic (congestive) heart failure: Secondary | ICD-10-CM

## 2020-09-16 LAB — COOXEMETRY PANEL
Carboxyhemoglobin: 1.1 % (ref 0.5–1.5)
Methemoglobin: 0.7 % (ref 0.0–1.5)
O2 Saturation: 61.5 %
Total hemoglobin: 12.8 g/dL (ref 12.0–16.0)

## 2020-09-16 LAB — CBC
HCT: 38.5 % — ABNORMAL LOW (ref 39.0–52.0)
Hemoglobin: 12.4 g/dL — ABNORMAL LOW (ref 13.0–17.0)
MCH: 24.9 pg — ABNORMAL LOW (ref 26.0–34.0)
MCHC: 32.2 g/dL (ref 30.0–36.0)
MCV: 77.3 fL — ABNORMAL LOW (ref 80.0–100.0)
Platelets: 166 10*3/uL (ref 150–400)
RBC: 4.98 MIL/uL (ref 4.22–5.81)
RDW: 14.7 % (ref 11.5–15.5)
WBC: 9.4 10*3/uL (ref 4.0–10.5)
nRBC: 0 % (ref 0.0–0.2)

## 2020-09-16 LAB — COMPREHENSIVE METABOLIC PANEL
ALT: 18 U/L (ref 0–44)
AST: 17 U/L (ref 15–41)
Albumin: 2.8 g/dL — ABNORMAL LOW (ref 3.5–5.0)
Alkaline Phosphatase: 65 U/L (ref 38–126)
Anion gap: 12 (ref 5–15)
BUN: 64 mg/dL — ABNORMAL HIGH (ref 8–23)
CO2: 25 mmol/L (ref 22–32)
Calcium: 8.1 mg/dL — ABNORMAL LOW (ref 8.9–10.3)
Chloride: 96 mmol/L — ABNORMAL LOW (ref 98–111)
Creatinine, Ser: 2.06 mg/dL — ABNORMAL HIGH (ref 0.61–1.24)
GFR, Estimated: 32 mL/min — ABNORMAL LOW (ref >60)
Glucose, Bld: 138 mg/dL — ABNORMAL HIGH (ref 70–99)
Potassium: 4.5 mmol/L (ref 3.5–5.1)
Sodium: 133 mmol/L — ABNORMAL LOW (ref 135–145)
Total Bilirubin: 1.5 mg/dL — ABNORMAL HIGH (ref 0.3–1.2)
Total Protein: 6.5 g/dL (ref 6.5–8.1)

## 2020-09-16 LAB — CULTURE, BLOOD (ROUTINE X 2)
Culture: NO GROWTH
Culture: NO GROWTH
Special Requests: ADEQUATE
Special Requests: ADEQUATE

## 2020-09-16 LAB — GLUCOSE, CAPILLARY
Glucose-Capillary: 126 mg/dL — ABNORMAL HIGH (ref 70–99)
Glucose-Capillary: 108 mg/dL — ABNORMAL HIGH (ref 70–99)
Glucose-Capillary: 179 mg/dL — ABNORMAL HIGH (ref 70–99)
Glucose-Capillary: 127 mg/dL — ABNORMAL HIGH (ref 70–99)

## 2020-09-16 LAB — MAGNESIUM: Magnesium: 2.3 mg/dL (ref 1.7–2.4)

## 2020-09-16 MED ORDER — RAMELTEON 8 MG PO TABS
8.0000 mg | ORAL_TABLET | Freq: Once | ORAL | Status: AC
  Administered 2020-09-16: 8 mg via ORAL
  Filled 2020-09-16: qty 1

## 2020-09-16 NOTE — Progress Notes (Signed)
Wrong vitals

## 2020-09-16 NOTE — Plan of Care (Signed)

## 2020-09-16 NOTE — Progress Notes (Signed)
Pt refused cpap

## 2020-09-16 NOTE — Progress Notes (Signed)
Patient ID: Brett Williamson, male   DOB: [birth year], 80 y.o.   MRN: [id number]     Advanced Heart Failure Rounding Note  PCP-Cardiologist: No primary care provider on file.   Subjective:    Breathing better, off oxygen with saturation 91%.  CXR yesterday with atelectasis and small effusions.  Co-ox is 62%, CVP on my read is 7 this morning.  I/Os negative, Lasix stopped yesterday.  Creatinine now trending down 2.18 => 2.36 => 2.06.    Objective:   Weight Range: 118.1 kg Body mass index is 36.31 kg/m.   Vital Signs:   Temp:  [97.5 F (36.4 C)-98.2 F (36.8 C)] 98.1 F (36.7 C) (02/06 0800) Pulse Rate:  [67-74] 74 (02/06 0800) Resp:  [16-23] 20 (02/06 0800) BP: (105-116)/(62-77) 116/67 (02/06 0800) SpO2:  [93 %-99 %] 93 % (02/06 0800) Weight:  [118.1 kg] 118.1 kg (02/06 0401) Last BM Date: 09/09/20  Weight change: Filed Weights   09/11/20 1525 09/15/20 0500 09/16/20 0401  Weight: 117 kg 117 kg 118.1 kg    Intake/Output:   Intake/Output Summary (Last 24 hours) at 09/16/2020 0945 Last data filed at 09/16/2020 0401 Gross per 24 hour  Intake 288.26 ml  Output 2650 ml  Net -2361.74 ml      Physical Exam    General: NAD Neck: No JVD, no thyromegaly or thyroid nodule.  Lungs: Clear to auscultation bilaterally with normal respiratory effort. CV: Nondisplaced PMI.  Heart regular S1/S2, no S3/S4, 3/6 SEM RUSB with obscured S2.  No peripheral edema.   Abdomen: Soft, nontender, no hepatosplenomegaly, no distention.  Skin: Intact without lesions or rashes.  Neurologic: Alert and oriented x 3.  Psych: Normal affect. Extremities: No clubbing or cyanosis.  HEENT: Normal.    Telemetry   NSR 80s with PVCs.    Labs    CBC Recent Labs    09/16/20 0419  WBC 9.4  HGB 12.4*  HCT 38.5*  MCV 77.3*  PLT 856   Basic Metabolic Panel Recent Labs    09/15/20 0500 09/16/20 0419  NA 133* 133*  K 4.2 4.5  CL 98 96*  CO2 24 25  GLUCOSE 135* 138*  BUN 60* 64*  CREATININE  2.36* 2.06*  CALCIUM 8.2* 8.1*  MG 2.3 2.3   Liver Function Tests Recent Labs    09/15/20 0500 09/16/20 0419  AST 17 17  ALT 16 18  ALKPHOS 61 65  BILITOT 2.2* 1.5*  PROT 6.5 6.5  ALBUMIN 2.8* 2.8*   No results for input(s): LIPASE, AMYLASE in the last 72 hours. Cardiac Enzymes No results for input(s): CKTOTAL, CKMB, CKMBINDEX, TROPONINI in the last 72 hours.  BNP: BNP (last 3 results) Recent Labs    09/11/20 1600 09/12/20 0708  BNP 1,413.0* 1,561.0*    ProBNP (last 3 results) No results for input(s): PROBNP in the last 8760 hours.   D-Dimer No results for input(s): DDIMER in the last 72 hours. Hemoglobin A1C No results for input(s): HGBA1C in the last 72 hours. Fasting Lipid Panel Recent Labs    09/14/20 0127  CHOL 116  HDL 26*  LDLCALC 74  TRIG 80  CHOLHDL 4.5   Thyroid Function Tests No results for input(s): TSH, T4TOTAL, T3FREE, THYROIDAB in the last 72 hours.  Invalid input(s): FREET3  Other results:   Imaging    DG Chest 2 View  Result Date: 09/15/2020 CLINICAL DATA:  Shortness of breath. EXAM: CHEST - 2 VIEW COMPARISON:  September 14, 2020. FINDINGS: Stable cardiomediastinal silhouette.  No pneumothorax is noted. Bibasilar opacities are noted concerning for atelectasis or edema with probable mild associated pleural effusions. Bony thorax is unremarkable. IMPRESSION: Bibasilar atelectasis or edema with probable mild associated pleural effusions. Electronically Signed   By: Marijo Conception M.D.   On: 09/15/2020 12:46     Medications:     Scheduled Medications: . aspirin EC  81 mg Oral Daily  . Chlorhexidine Gluconate Cloth  6 each Topical Daily  . fluconazole  100 mg Oral Daily  . insulin aspart  0-15 Units Subcutaneous TID WC  . insulin aspart  0-5 Units Subcutaneous QHS  . mouth rinse  15 mL Mouth Rinse BID  . nystatin   Topical TID  . sodium chloride flush  10-40 mL Intracatheter Q12H  . sodium chloride flush  3 mL Intravenous Q12H     Infusions: . sodium chloride 10 mL/hr at 09/15/20 0400  . DOBUTamine 2.5 mcg/kg/min (09/15/20 0400)    PRN Medications: sodium chloride, acetaminophen, Gerhardt's butt cream, ondansetron (ZOFRAN) IV, sodium chloride flush    Assessment/Plan   1. Acute on chronic combined systolic and diastolic CHF:  ECHO 37/8588 with normal EF, severe aortic stenosis  AVA 0.57 cm2 and mean gradient of 51.25mmHg G2DD.  ECHO 09/12/20 with EF down to 35-40%, worsening of AVA to 0.24 cm2 and mean gradient now 65.7 mmHg, new RWMA.  Likely due to worsening AS but concern for an ischemic component as well.  He is now on dobutamine 2.5, IV Lasix stopped yesterday.  Today, CVP 7 on my read with co-ox 62%.  BUN/creatinine now coming down, creatinine 2.06 today. Breathing better and off oxygen.   - Continue dobutamine 2.5.  - Hold diuretics for now with CVP in normal range.   2. Severe Aortic Stenosis:  Critical stenosis, significantly progressed since 06/2020. By echo, worsening of AVA to 0.24 cm2 and mean gradient now 65.7 mmHg - Will try to optimize patient so he can complete workup for possible TAVR, Dr Burt Knack following.  - Will need CT scans and right/left heart cath, discuss timing with Dr. Burt Knack.  Would like to see creatinine continue to trend down prior to contrast load.  3. AKI on CKD 3b: Baseline cr around 1.6.  BUN/creatinine up to 60/2.36 yesterday, now 64/2.06.  - Continue dobutamine.  - Hold Lasix.  - po hydration.  4. Traumatic subdural hematoma: 11/21 with craniotomy/evacuation.  He is on SCDs for now for DVT prophylaxis.   .  Length of Stay: [place], MD  09/16/2020, 9:45 AM  Advanced Heart Failure Team Pager [phone] (M-F; [place])  Please contact [facility] for night-coverage after hours (4p -7a ) and weekends on [email or web address]

## 2020-09-16 NOTE — Plan of Care (Signed)

## 2020-09-17 LAB — COMPREHENSIVE METABOLIC PANEL
ALT: 19 U/L (ref 0–44)
AST: 21 U/L (ref 15–41)
Albumin: 2.7 g/dL — ABNORMAL LOW (ref 3.5–5.0)
Alkaline Phosphatase: 64 U/L (ref 38–126)
Anion gap: 9 (ref 5–15)
BUN: 50 mg/dL — ABNORMAL HIGH (ref 8–23)
CO2: 26 mmol/L (ref 22–32)
Calcium: 8.1 mg/dL — ABNORMAL LOW (ref 8.9–10.3)
Chloride: 99 mmol/L (ref 98–111)
Creatinine, Ser: 1.58 mg/dL — ABNORMAL HIGH (ref 0.61–1.24)
GFR, Estimated: 44 mL/min — ABNORMAL LOW (ref >60)
Glucose, Bld: 153 mg/dL — ABNORMAL HIGH (ref 70–99)
Potassium: 4.4 mmol/L (ref 3.5–5.1)
Sodium: 134 mmol/L — ABNORMAL LOW (ref 135–145)
Total Bilirubin: 1.8 mg/dL — ABNORMAL HIGH (ref 0.3–1.2)
Total Protein: 6.7 g/dL (ref 6.5–8.1)

## 2020-09-17 LAB — CBC
HCT: 38.3 % — ABNORMAL LOW (ref 39.0–52.0)
Hemoglobin: 12.2 g/dL — ABNORMAL LOW (ref 13.0–17.0)
MCH: 24.7 pg — ABNORMAL LOW (ref 26.0–34.0)
MCHC: 31.9 g/dL (ref 30.0–36.0)
MCV: 77.5 fL — ABNORMAL LOW (ref 80.0–100.0)
Platelets: 178 10*3/uL (ref 150–400)
RBC: 4.94 MIL/uL (ref 4.22–5.81)
RDW: 14.7 % (ref 11.5–15.5)
WBC: 9.2 10*3/uL (ref 4.0–10.5)
nRBC: 0 % (ref 0.0–0.2)

## 2020-09-17 LAB — GLUCOSE, CAPILLARY
Glucose-Capillary: 119 mg/dL — ABNORMAL HIGH (ref 70–99)
Glucose-Capillary: 116 mg/dL — ABNORMAL HIGH (ref 70–99)
Glucose-Capillary: 138 mg/dL — ABNORMAL HIGH (ref 70–99)
Glucose-Capillary: 129 mg/dL — ABNORMAL HIGH (ref 70–99)

## 2020-09-17 LAB — FERRITIN: Ferritin: 38 ng/mL (ref 24–336)

## 2020-09-17 LAB — COOXEMETRY PANEL
Carboxyhemoglobin: 1.3 % (ref 0.5–1.5)
Methemoglobin: 0.9 % (ref 0.0–1.5)
O2 Saturation: 63.6 %
Total hemoglobin: 12.6 g/dL (ref 12.0–16.0)

## 2020-09-17 LAB — IRON AND TIBC
Iron: 31 ug/dL — ABNORMAL LOW (ref 45–182)
Saturation Ratios: 10 % — ABNORMAL LOW (ref 17.9–39.5)
TIBC: 322 ug/dL (ref 250–450)
UIBC: 291 ug/dL

## 2020-09-17 MED ORDER — SODIUM CHLORIDE 0.9 % IV SOLN
510.0000 mg | Freq: Once | INTRAVENOUS | Status: AC
  Administered 2020-09-17: 510 mg via INTRAVENOUS
  Filled 2020-09-17: qty 17

## 2020-09-17 NOTE — Progress Notes (Addendum)
[id number] 86 SR, 92%RA. Offered to walk with pt who declined. Pt stated "not today" when I offered to get to recliner or walk. Stated he sat in chair a long time yesterday but just not feeling up to it today. Pt had HHPT after SNF. Would recommend PT/OT consults while here to evaluate and help with strengthening. Graylon Good RN BSN 09/17/2020 1:56 PM

## 2020-09-17 NOTE — Plan of Care (Signed)

## 2020-09-17 NOTE — Progress Notes (Addendum)
Patient ID: Brett Williamson, male   DOB: 1941-07-25, 80 y.o.   MRN: [id number]     Advanced Heart Failure Rounding Note  PCP-Cardiologist: No primary care provider on file.   Subjective:    He reports his breathing is stable, improved from initial presentation.    Co-ox is 63%, CVP up a bit around 10-12 .  Seems to be autodiuresing with dobutamine without lasix stopped 2/5.  Creatinine has improved although today's value is pending.   2.18 => 2.36 => 2.06=>.... 1.58   Objective:   Weight Range: 119.6 kg Body mass index is 36.77 kg/m.   Vital Signs:   Temp:  [98 F (36.7 C)-98.4 F (36.9 C)] 98 F (36.7 C) (02/07 0344) Pulse Rate:  [69-83] 75 (02/07 0344) Resp:  [20] 20 (02/07 0344) BP: (111-123)/(62-77) 117/66 (02/07 0344) SpO2:  [90 %-95 %] 95 % (02/07 0344) Weight:  [119.6 kg] 119.6 kg (02/07 0344) Last BM Date: 09/16/20  Weight change: Filed Weights   09/15/20 0500 09/16/20 0401 09/17/20 0344  Weight: 117 kg 118.1 kg 119.6 kg    Intake/Output:   Intake/Output Summary (Last 24 hours) at 09/17/2020 0725 Last data filed at 09/17/2020 0344 Gross per 24 hour  Intake 460 ml  Output 2900 ml  Net -2440 ml      Physical Exam    Cardiac: JVD difficult to assess, normal rate and rhythm, clear s1 and s2, 3/6 AS murmur, no rubs or gallops, LE warm, wrapped in unna boots bilaterally Pulmonary: decreased in bases upper fields clear, not in distress Abdominal: non distended abdomen, soft and nontender Psych: Alert, conversant, in good spirits   Telemetry   NSR 60-70s with PVCs.    Labs    CBC Recent Labs    09/16/20 0419 09/17/20 0500  WBC 9.4 9.2  HGB 12.4* 12.2*  HCT 38.5* 38.3*  MCV 77.3* 77.5*  PLT 166 0000000   Basic Metabolic Panel Recent Labs    09/15/20 0500 09/16/20 0419  NA 133* 133*  K 4.2 4.5  CL 98 96*  CO2 24 25  GLUCOSE 135* 138*  BUN 60* 64*  CREATININE 2.36* 2.06*  CALCIUM 8.2* 8.1*  MG 2.3 2.3   Liver Function Tests Recent Labs     09/15/20 0500 09/16/20 0419  AST 17 17  ALT 16 18  ALKPHOS 61 65  BILITOT 2.2* 1.5*  PROT 6.5 6.5  ALBUMIN 2.8* 2.8*   No results for input(s): LIPASE, AMYLASE in the last 72 hours. Cardiac Enzymes No results for input(s): CKTOTAL, CKMB, CKMBINDEX, TROPONINI in the last 72 hours.  BNP: BNP (last 3 results) Recent Labs    09/11/20 1600 09/12/20 0708  BNP 1,413.0* 1,561.0*    ProBNP (last 3 results) No results for input(s): PROBNP in the last 8760 hours.   D-Dimer No results for input(s): DDIMER in the last 72 hours. Hemoglobin A1C No results for input(s): HGBA1C in the last 72 hours. Fasting Lipid Panel No results for input(s): CHOL, HDL, LDLCALC, TRIG, CHOLHDL, LDLDIRECT in the last 72 hours. Thyroid Function Tests No results for input(s): TSH, T4TOTAL, T3FREE, THYROIDAB in the last 72 hours.  Invalid input(s): FREET3  Other results:   Imaging    No results found.   Medications:     Scheduled Medications: . aspirin EC  81 mg Oral Daily  . Chlorhexidine Gluconate Cloth  6 each Topical Daily  . fluconazole  100 mg Oral Daily  . insulin aspart  0-15 Units Subcutaneous TID WC  .  insulin aspart  0-5 Units Subcutaneous QHS  . mouth rinse  15 mL Mouth Rinse BID  . nystatin   Topical TID  . sodium chloride flush  10-40 mL Intracatheter Q12H  . sodium chloride flush  3 mL Intravenous Q12H    Infusions: . sodium chloride 10 mL/hr at 09/15/20 0400  . DOBUTamine 2.5 mcg/kg/min (09/16/20 1946)    PRN Medications: sodium chloride, acetaminophen, Gerhardt's butt cream, ondansetron (ZOFRAN) IV, sodium chloride flush    Assessment/Plan   1. Acute on chronic combined systolic and diastolic CHF:  ECHO 01/2693 with normal EF, severe aortic stenosis  AVA 0.57 cm2 and mean gradient of 51.44mmHg G2DD.  ECHO 09/12/20 with EF down to 35-40%, worsening of AVA to 0.24 cm2 and mean gradient now 65.7 mmHg, new RWMA, new severely reduced RV function.  Likely due to worsening  AS but concern for an ischemic component as well.   -Lasix stopped 2/5 he has been auto diuresing net neg 2.5L but weight up slightly, cvp up a bit to 10-12 but patient reports breathing well -Continue dobutamine 2.5.  -Await cmp results may do mild diuretic to keep slightly negative vs let him continue to auto diurese.   2. Severe Aortic Stenosis:  Critical stenosis, significantly progressed since 06/2020. By echo, worsening of AVA to 0.24 cm2 and mean gradient now 65.7 mmHg - Will try to optimize patient so he can complete workup for possible TAVR, Dr Burt Knack following.  - Will need CT scans and right/left heart cath, discuss timing with Dr. Burt Knack.  Would like to see creatinine continue to trend down prior to contrast load.  3. AKI on CKD 3b: Baseline cr around 1.6. peaked at 2.36  But improving - Continue dobutamine.  -Await cmp results may need mild diuretic to keep slightly negative vs let him continue to auto diurese.   4. Traumatic subdural hematoma: 11/21 with craniotomy/evacuation.  He is on SCDs for now for DVT prophylaxis.   .  Length of Stay: 6  Katherine Roan, MD  09/17/2020, 7:25 AM  Advanced Heart Failure Team Pager [phone] (M-F; 7a - 4p)  Please contact [facility] for night-coverage after hours (4p -7a ) and weekends on [email or web address]  Patient seen and examined with the above-signed Advanced Practice Provider and/or Housestaff. I personally reviewed laboratory data, imaging studies and relevant notes. I independently examined the patient and formulated the important aspects of the plan. I have edited the note to reflect any of my changes or salient points. I have personally discussed the plan with the patient and/or family.  He is off lasix but continues to diurese. CVP climbing slowly however. Breathing better. BP and creatinine improved with dobutamine support.   General:  Sitting up in bed. Daughter feeding him dinner. No resp difficulty HEENT: normal Neck: supple.  no JVD. Carotids 2+ bilat; + bruits. No lymphadenopathy or thryomegaly appreciated. Cor: PMI nondisplaced. Regular rate & rhythm 2/6 AS. s2 inaudible  Lungs: clear Abdomen: soft, nontender, nondistended. No hepatosplenomegaly. No bruits or masses. Good bowel sounds. Extremities: no cyanosis, clubbing, rash, edema Neuro: alert & orientedx3, cranial nerves grossly intact. moves all 4 extremities w/o difficulty. Affect pleasant  He is progressing slowly but definitely making progress. BP and renal function improved on dobutamine. If renal function stable overnight will plan R/L cath tomorrow. Encouraged continued PT/OT to improve his strength. Hopefully we can get him to TAVR but I told him and his daughter that there are still several hurdles to cross.  Glori Bickers, MD  7:05 PM

## 2020-09-18 ENCOUNTER — Encounter (HOSPITAL_COMMUNITY): Payer: Self-pay | Admitting: Internal Medicine

## 2020-09-18 ENCOUNTER — Encounter (HOSPITAL_COMMUNITY)
Admission: EM | Disposition: A | Discharge status: Skilled Nursing Facility | Payer: Self-pay | Source: Home / Self Care | Attending: Cardiovascular Disease

## 2020-09-18 HISTORY — PX: RIGHT/LEFT HEART CATH AND CORONARY ANGIOGRAPHY: CATH118266

## 2020-09-18 LAB — POCT I-STAT 7, (LYTES, BLD GAS, ICA,H+H)
Acid-Base Excess: 0 mmol/L (ref 0.0–2.0)
Bicarbonate: 24.3 mmol/L (ref 20.0–28.0)
Calcium, Ion: 1.05 mmol/L — ABNORMAL LOW (ref 1.15–1.40)
HCT: 39 % (ref 39.0–52.0)
Hemoglobin: 13.3 g/dL (ref 13.0–17.0)
O2 Saturation: 96 %
Potassium: 4.2 mmol/L (ref 3.5–5.1)
Sodium: 138 mmol/L (ref 135–145)
TCO2: 25 mmol/L (ref 22–32)
pCO2 arterial: 39 mmHg (ref 32.0–48.0)
pH, Arterial: 7.402 (ref 7.350–7.450)
pO2, Arterial: 85 mmHg (ref 83.0–108.0)

## 2020-09-18 LAB — POCT I-STAT EG7
Acid-Base Excess: 0 mmol/L (ref 0.0–2.0)
Acid-Base Excess: 2 mmol/L (ref 0.0–2.0)
Bicarbonate: 25.4 mmol/L (ref 20.0–28.0)
Bicarbonate: 27.4 mmol/L (ref 20.0–28.0)
Calcium, Ion: 1.03 mmol/L — ABNORMAL LOW (ref 1.15–1.40)
Calcium, Ion: 1.11 mmol/L — ABNORMAL LOW (ref 1.15–1.40)
HCT: 39 % (ref 39.0–52.0)
HCT: 41 % (ref 39.0–52.0)
Hemoglobin: 13.3 g/dL (ref 13.0–17.0)
Hemoglobin: 13.9 g/dL (ref 13.0–17.0)
O2 Saturation: 65 %
O2 Saturation: 66 %
Potassium: 4.1 mmol/L (ref 3.5–5.1)
Potassium: 4.4 mmol/L (ref 3.5–5.1)
Sodium: 137 mmol/L (ref 135–145)
Sodium: 139 mmol/L (ref 135–145)
TCO2: 27 mmol/L (ref 22–32)
TCO2: 29 mmol/L (ref 22–32)
pCO2, Ven: 42.8 mmHg — ABNORMAL LOW (ref 44.0–60.0)
pCO2, Ven: 45.9 mmHg (ref 44.0–60.0)
pH, Ven: 7.381 (ref 7.250–7.430)
pH, Ven: 7.385 (ref 7.250–7.430)
pO2, Ven: 34 mmHg (ref 32.0–45.0)
pO2, Ven: 35 mmHg (ref 32.0–45.0)

## 2020-09-18 LAB — CBC
HCT: 40.6 % (ref 39.0–52.0)
Hemoglobin: 12.3 g/dL — ABNORMAL LOW (ref 13.0–17.0)
MCH: 23.9 pg — ABNORMAL LOW (ref 26.0–34.0)
MCHC: 30.3 g/dL (ref 30.0–36.0)
MCV: 78.8 fL — ABNORMAL LOW (ref 80.0–100.0)
Platelets: 181 10*3/uL (ref 150–400)
RBC: 5.15 MIL/uL (ref 4.22–5.81)
RDW: 14.6 % (ref 11.5–15.5)
WBC: 8.2 10*3/uL (ref 4.0–10.5)
nRBC: 0 % (ref 0.0–0.2)

## 2020-09-18 LAB — GLUCOSE, CAPILLARY
Glucose-Capillary: 110 mg/dL — ABNORMAL HIGH (ref 70–99)
Glucose-Capillary: 124 mg/dL — ABNORMAL HIGH (ref 70–99)
Glucose-Capillary: 113 mg/dL — ABNORMAL HIGH (ref 70–99)
Glucose-Capillary: 118 mg/dL — ABNORMAL HIGH (ref 70–99)

## 2020-09-18 LAB — COMPREHENSIVE METABOLIC PANEL
ALT: 19 U/L (ref 0–44)
AST: 21 U/L (ref 15–41)
Albumin: 2.7 g/dL — ABNORMAL LOW (ref 3.5–5.0)
Alkaline Phosphatase: 63 U/L (ref 38–126)
Anion gap: 9 (ref 5–15)
BUN: 42 mg/dL — ABNORMAL HIGH (ref 8–23)
CO2: 25 mmol/L (ref 22–32)
Calcium: 8.3 mg/dL — ABNORMAL LOW (ref 8.9–10.3)
Chloride: 98 mmol/L (ref 98–111)
Creatinine, Ser: 1.44 mg/dL — ABNORMAL HIGH (ref 0.61–1.24)
GFR, Estimated: 49 mL/min — ABNORMAL LOW (ref >60)
Glucose, Bld: 115 mg/dL — ABNORMAL HIGH (ref 70–99)
Potassium: 4.4 mmol/L (ref 3.5–5.1)
Sodium: 132 mmol/L — ABNORMAL LOW (ref 135–145)
Total Bilirubin: 2.1 mg/dL — ABNORMAL HIGH (ref 0.3–1.2)
Total Protein: 6.5 g/dL (ref 6.5–8.1)

## 2020-09-18 LAB — COOXEMETRY PANEL
Carboxyhemoglobin: 1.5 % (ref 0.5–1.5)
Methemoglobin: 1 % (ref 0.0–1.5)
O2 Saturation: 61.2 %
Total hemoglobin: 12.4 g/dL (ref 12.0–16.0)

## 2020-09-18 SURGERY — RIGHT/LEFT HEART CATH AND CORONARY ANGIOGRAPHY
Anesthesia: LOCAL

## 2020-09-18 MED ORDER — SODIUM CHLORIDE 0.9% FLUSH
3.0000 mL | INTRAVENOUS | Status: DC | PRN
  Administered 2020-09-19: 10:00:00 3 mL via INTRAVENOUS

## 2020-09-18 MED ORDER — GUAIFENESIN-DM 100-10 MG/5ML PO SYRP
10.0000 mL | ORAL_SOLUTION | ORAL | Status: DC | PRN
  Administered 2020-09-18 (×2): 10 mL via ORAL
  Filled 2020-09-18 (×2): qty 10

## 2020-09-18 MED ORDER — HEPARIN (PORCINE) IN NACL 1000-0.9 UT/500ML-% IV SOLN
INTRAVENOUS | Status: AC
  Filled 2020-09-18: qty 1000

## 2020-09-18 MED ORDER — SODIUM CHLORIDE 0.9% FLUSH: 3.0000 mL | INTRAVENOUS | Status: DC | PRN

## 2020-09-18 MED ORDER — LABETALOL HCL 5 MG/ML IV SOLN: 10.0000 mg | INTRAVENOUS | Status: AC | PRN

## 2020-09-18 MED ORDER — FUROSEMIDE 10 MG/ML IJ SOLN
INTRAMUSCULAR | Status: DC | PRN
  Administered 2020-09-18: 80 mg via INTRAVENOUS

## 2020-09-18 MED ORDER — FUROSEMIDE 10 MG/ML IJ SOLN
INTRAMUSCULAR | Status: AC
  Filled 2020-09-18: qty 8

## 2020-09-18 MED ORDER — ASPIRIN 81 MG PO CHEW: 81.0000 mg | CHEWABLE_TABLET | ORAL | Status: DC

## 2020-09-18 MED ORDER — ACETAMINOPHEN 325 MG PO TABS
650.0000 mg | ORAL_TABLET | ORAL | Status: DC | PRN
  Administered 2020-09-18 – 2020-09-24 (×2): 650 mg via ORAL
  Filled 2020-09-18 (×2): qty 2

## 2020-09-18 MED ORDER — FUROSEMIDE 10 MG/ML IJ SOLN
80.0000 mg | Freq: Two times a day (BID) | INTRAMUSCULAR | Status: DC
  Administered 2020-09-19: 80 mg via INTRAVENOUS
  Filled 2020-09-18: qty 8

## 2020-09-18 MED ORDER — SODIUM CHLORIDE 0.9 % IV SOLN: INTRAVENOUS | Status: DC

## 2020-09-18 MED ORDER — SODIUM CHLORIDE 0.9% FLUSH: 3.0000 mL | Freq: Two times a day (BID) | INTRAVENOUS | Status: DC

## 2020-09-18 MED ORDER — VERAPAMIL HCL 2.5 MG/ML IV SOLN
INTRAVENOUS | Status: DC | PRN
  Administered 2020-09-18: 10 mL via INTRA_ARTERIAL

## 2020-09-18 MED ORDER — ENOXAPARIN SODIUM 40 MG/0.4ML ~~LOC~~ SOLN
40.0000 mg | SUBCUTANEOUS | Status: DC
  Administered 2020-09-19: 40 mg via SUBCUTANEOUS
  Filled 2020-09-18: qty 0.4

## 2020-09-18 MED ORDER — SODIUM CHLORIDE 0.9 % IV SOLN: 250.0000 mL | INTRAVENOUS | Status: DC | PRN

## 2020-09-18 MED ORDER — LIDOCAINE HCL (PF) 1 % IJ SOLN
INTRAMUSCULAR | Status: AC
  Filled 2020-09-18: qty 30

## 2020-09-18 MED ORDER — VERAPAMIL HCL 2.5 MG/ML IV SOLN
INTRAVENOUS | Status: AC
  Filled 2020-09-18: qty 2

## 2020-09-18 MED ORDER — HYDRALAZINE HCL 20 MG/ML IJ SOLN: 10.0000 mg | INTRAMUSCULAR | Status: AC | PRN

## 2020-09-18 MED ORDER — HEPARIN (PORCINE) IN NACL 1000-0.9 UT/500ML-% IV SOLN
INTRAVENOUS | Status: DC | PRN
  Administered 2020-09-18 (×2): 500 mL

## 2020-09-18 MED ORDER — IOHEXOL 350 MG/ML SOLN
INTRAVENOUS | Status: DC | PRN
  Administered 2020-09-18: 60 mL via INTRA_ARTERIAL

## 2020-09-18 MED ORDER — HEPARIN SODIUM (PORCINE) 1000 UNIT/ML IJ SOLN
INTRAMUSCULAR | Status: AC
  Filled 2020-09-18: qty 1

## 2020-09-18 MED ORDER — ONDANSETRON HCL 4 MG/2ML IJ SOLN: 4.0000 mg | Freq: Four times a day (QID) | INTRAMUSCULAR | Status: DC | PRN

## 2020-09-18 MED ORDER — LIDOCAINE HCL (PF) 1 % IJ SOLN
INTRAMUSCULAR | Status: DC | PRN
  Administered 2020-09-18 (×2): 2 mL

## 2020-09-18 MED ORDER — HEPARIN SODIUM (PORCINE) 1000 UNIT/ML IJ SOLN
INTRAMUSCULAR | Status: DC | PRN
  Administered 2020-09-18: 6000 [IU] via INTRAVENOUS

## 2020-09-18 MED ORDER — SODIUM CHLORIDE 0.9% FLUSH
3.0000 mL | Freq: Two times a day (BID) | INTRAVENOUS | Status: DC
  Administered 2020-09-18 – 2020-09-20 (×3): 3 mL via INTRAVENOUS

## 2020-09-18 SURGICAL SUPPLY — 20 items
BAG SNAP BAND KOVER 36X36 (MISCELLANEOUS) ×2 IMPLANT
CATH BALLN WEDGE 5F 110CM (CATHETERS) ×2 IMPLANT
CATH INFINITI 5 FR 3DRC (CATHETERS) ×2 IMPLANT
CATH INFINITI 5 FR JL3.5 (CATHETERS) ×2 IMPLANT
CATH INFINITI JR4 5F (CATHETERS) ×2 IMPLANT
COVER DOME SNAP 22 D (MISCELLANEOUS) ×2 IMPLANT
DEVICE RAD COMP TR BAND LRG (VASCULAR PRODUCTS) ×2 IMPLANT
GLIDESHEATH SLEND SS 6F .021 (SHEATH) ×2 IMPLANT
GUIDEWIRE INQWIRE 1.5J.035X260 (WIRE) ×1 IMPLANT
INQWIRE 1.5J .035X260CM (WIRE) ×2
KIT HEART LEFT (KITS) ×2 IMPLANT
KIT MICROPUNCTURE NIT STIFF (SHEATH) ×2 IMPLANT
MAT PREVALON FULL STRYKER (MISCELLANEOUS) ×2 IMPLANT
PACK CARDIAC CATHETERIZATION (CUSTOM PROCEDURE TRAY) ×2 IMPLANT
PROTECTION STATION PRESSURIZED (MISCELLANEOUS) ×2
SHEATH GLIDE SLENDER 4/5FR (SHEATH) ×2 IMPLANT
STATION PROTECTION PRESSURIZED (MISCELLANEOUS) ×1 IMPLANT
TRANSDUCER W/STOPCOCK (MISCELLANEOUS) ×2 IMPLANT
TUBING CIL FLEX 10 FLL-RA (TUBING) ×2 IMPLANT
WIRE EMERALD ST .035X260CM (WIRE) ×2 IMPLANT

## 2020-09-18 NOTE — Interval H&P Note (Signed)
History and Physical Interval Note:  09/18/2020 8:38 AM  Brett Williamson  has presented today for surgery, with the diagnosis of aortic stenosis.  The various methods of treatment have been discussed with the patient and family. After consideration of risks, benefits and other options for treatment, the patient has consented to  Procedure(s): RIGHT/LEFT HEART CATH AND CORONARY ANGIOGRAPHY (N/A) as a surgical intervention.  The patient's history has been reviewed, patient examined, no change in status, stable for surgery.  I have reviewed the patient's chart and labs.  Questions were answered to the patient's satisfaction.     Daniel Bensimhon

## 2020-09-18 NOTE — H&P (View-Only) (Signed)
Patient ID: Brett Williamson, male   DOB: 08-May-1941, 80 y.o.   MRN: [id number]     Advanced Heart Failure Rounding Note  PCP-Cardiologist: No primary care provider on file.   Subjective:    He reports his breathing is stable today no dyspnea at rest.    Co-ox is 61%, CVP at 8 .  Weight stable again 2.5L out with no lasix.  Creatinine continuing to improve from peak of 2.36 now 1.44  Objective:   Weight Range: 119.6 kg Body mass index is 36.77 kg/m.   Vital Signs:   Temp:  [97.5 F (36.4 C)-98.6 F (37 C)] 98.3 F (36.8 C) (02/08 0400) Pulse Rate:  [71-91] 81 (02/08 0700) Resp:  [19-26] 25 (02/08 0700) BP: (102-122)/(64-85) 107/85 (02/08 0400) SpO2:  [90 %-95 %] 93 % (02/08 0700) Weight:  [119.6 kg] 119.6 kg (02/08 0500) Last BM Date: 09/16/20  Weight change: Filed Weights   09/16/20 0401 09/17/20 0344 09/18/20 0500  Weight: 118.1 kg 119.6 kg 119.6 kg    Intake/Output:   Intake/Output Summary (Last 24 hours) at 09/18/2020 0738 Last data filed at 09/18/2020 0450 Gross per 24 hour  Intake 480 ml  Output 3050 ml  Net -2570 ml      Physical Exam    Cardiac: JVD difficult to assess, normal rate and rhythm, clear s1 and s2, 3/6 AS murmur, no rubs or gallops, LE warm, wrapped in unna boots bilaterally Pulmonary: decreased in bases upper fields clear, not in distress Abdominal: non distended abdomen, soft and nontender Psych: Alert, conversant, in good spirits   Telemetry   NSR 60-70s with PVCs.    Labs    CBC Recent Labs    09/17/20 0500 09/18/20 0500  WBC 9.2 8.2  HGB 12.2* 12.3*  HCT 38.3* 40.6  MCV 77.5* 78.8*  PLT 178 702   Basic Metabolic Panel Recent Labs    09/16/20 0419 09/17/20 0920 09/18/20 0500  NA 133* 134* 132*  K 4.5 4.4 4.4  CL 96* 99 98  CO2 25 26 25   GLUCOSE 138* 153* 115*  BUN 64* 50* 42*  CREATININE 2.06* 1.58* 1.44*  CALCIUM 8.1* 8.1* 8.3*  MG 2.3  --   --    Liver Function Tests Recent Labs    09/17/20 0920 09/18/20 0500   AST 21 21  ALT 19 19  ALKPHOS 64 63  BILITOT 1.8* 2.1*  PROT 6.7 6.5  ALBUMIN 2.7* 2.7*   No results for input(s): LIPASE, AMYLASE in the last 72 hours. Cardiac Enzymes No results for input(s): CKTOTAL, CKMB, CKMBINDEX, TROPONINI in the last 72 hours.  BNP: BNP (last 3 results) Recent Labs    09/11/20 1600 09/12/20 0708  BNP 1,413.0* 1,561.0*    ProBNP (last 3 results) No results for input(s): PROBNP in the last 8760 hours.   D-Dimer No results for input(s): DDIMER in the last 72 hours. Hemoglobin A1C No results for input(s): HGBA1C in the last 72 hours. Fasting Lipid Panel No results for input(s): CHOL, HDL, LDLCALC, TRIG, CHOLHDL, LDLDIRECT in the last 72 hours. Thyroid Function Tests No results for input(s): TSH, T4TOTAL, T3FREE, THYROIDAB in the last 72 hours.  Invalid input(s): FREET3  Other results:   Imaging    No results found.   Medications:     Scheduled Medications: . aspirin EC  81 mg Oral Daily  . Chlorhexidine Gluconate Cloth  6 each Topical Daily  . fluconazole  100 mg Oral Daily  . insulin aspart  0-15  Units Subcutaneous TID WC  . insulin aspart  0-5 Units Subcutaneous QHS  . mouth rinse  15 mL Mouth Rinse BID  . nystatin   Topical TID  . sodium chloride flush  10-40 mL Intracatheter Q12H  . sodium chloride flush  3 mL Intravenous Q12H  . sodium chloride flush  3 mL Intravenous Q12H    Infusions: . sodium chloride 10 mL/hr at 09/15/20 0400  . DOBUTamine 2.5 mcg/kg/min (09/16/20 1946)    PRN Medications: sodium chloride, acetaminophen, Gerhardt's butt cream, guaiFENesin-dextromethorphan, ondansetron (ZOFRAN) IV, sodium chloride flush    Assessment/Plan   1. Acute on chronic combined systolic and diastolic CHF:  ECHO 52/8413 with normal EF, severe aortic stenosis  AVA 0.57 cm2 and mean gradient of 51.17mmHg G2DD.  ECHO 09/12/20 with EF down to 35-40%, worsening of AVA to 0.24 cm2 and mean gradient now 65.7 mmHg, new RWMA, new  severely reduced RV function.  Likely due to worsening AS but concern for an ischemic component as well.   -Lasix stopped 2/5 he has been auto diuresing net neg 2.5L but weight up slightly, cvp up a bit to 10-12 but patient reports breathing well -Continue dobutamine 2.5.  -Await cmp results may do mild diuretic to keep slightly negative vs let him continue to auto diurese.   2. Severe Aortic Stenosis:  Critical stenosis, significantly progressed since 06/2020. By echo, worsening of AVA to 0.24 cm2 and mean gradient now 65.7 mmHg - Will try to optimize patient so he can complete workup for possible TAVR, Dr Burt Knack following.  - Will need CT scans and right/left heart cath, discuss timing with Dr. Burt Knack.  Would like to see creatinine continue to trend down prior to contrast load.  3. AKI on CKD 3b: Baseline cr around 1.6. peaked at 2.36  But improving - Continue dobutamine.  -Await cmp results may need mild diuretic to keep slightly negative vs let him continue to auto diurese.   4. Traumatic subdural hematoma: 11/21 with craniotomy/evacuation.  He is on SCDs for now for DVT prophylaxis.   .  Length of Stay: [place], MD  09/18/2020, 7:38 AM  Advanced Heart Failure Team Pager [phone] (M-F; 7a - 4p)  Please contact [facility] for night-coverage after hours (4p -7a ) and weekends on [email or web address]   Patient seen and examined with the above-signed Advanced Practice Provider and/or Housestaff. I personally reviewed laboratory data, imaging studies and relevant notes. I independently examined the patient and formulated the important aspects of the plan. I have edited the note to reflect any of my changes or salient points. I have personally discussed the plan with the patient and/or family.  Continues to improve with dobutamine. Has diuresed well but weight actually up. CVP 8. Renal function improved. Denies CP. SOB has improved greatly. No orthopnea or PND  General: Sitting up in  bed. No resp difficulty HEENT: normal Neck: supple. JVP 8 Carotids 2+ bilat; + bruits. No lymphadenopathy or thryomegaly appreciated. Cor: PMI nondisplaced. Regular rate & rhythm. 3/6 AS s2 absent Lungs: clear Abdomen: soft, nontender, nondistended. No hepatosplenomegaly. No bruits or masses. Good bowel sounds. Extremities: no cyanosis, clubbing, rash, tr edema + UNNA boots Neuro: alert & orientedx3, cranial nerves grossly intact. moves all 4 extremities w/o difficulty. Affect pleasant  Improving with dobutamine. Will plan R/L cath today as part of TAVR work-up. Minimize contrast. Will need TAVR scans as well. Needs aggressive PT/OT.   Glori Bickers, MD  8:26 AM

## 2020-09-18 NOTE — Progress Notes (Addendum)
[place] VALVE TEAM  Patient Name: Brett Williamson Date of Encounter: 09/18/2020  Primary Cardiologist: Dr. Jesse Sans Problem List     Principal Problem:   Acute on chronic diastolic CHF (congestive heart failure) ([place]) Active Problems:   History of subdural hematoma   Essential hypertension   Controlled type 2 diabetes mellitus with hyperglycemia, without long-term current use of insulin (HCC)   Elevated troponin   Physical deconditioning   Candidiasis of skin   Acute cystitis   Chronic stasis dermatitis   Chronic renal failure, stage 3b (HCC)     Subjective   Drowsy. No complaints. Breathing is "okay"  Inpatient Medications    Scheduled Meds: . aspirin EC  81 mg Oral Daily  . Chlorhexidine Gluconate Cloth  6 each Topical Daily  . [START ON 09/19/2020] enoxaparin (LOVENOX) injection  40 mg Subcutaneous Q24H  . fluconazole  100 mg Oral Daily  . furosemide  80 mg Intravenous BID  . insulin aspart  0-15 Units Subcutaneous TID WC  . insulin aspart  0-5 Units Subcutaneous QHS  . mouth rinse  15 mL Mouth Rinse BID  . nystatin   Topical TID  . sodium chloride flush  10-40 mL Intracatheter Q12H  . sodium chloride flush  3 mL Intravenous Q12H  . sodium chloride flush  3 mL Intravenous Q12H   Continuous Infusions: . sodium chloride 10 mL/hr at 09/15/20 0400  . sodium chloride    . DOBUTamine 2.5 mcg/kg/min (09/16/20 1946)   PRN Meds: sodium chloride, sodium chloride, acetaminophen, Gerhardt's butt cream, guaiFENesin-dextromethorphan, hydrALAZINE, labetalol, ondansetron (ZOFRAN) IV, sodium chloride flush, sodium chloride flush   Vital Signs    Vitals:   09/18/20 0931 09/18/20 0936 09/18/20 0941 09/18/20 1015  BP: (!) 136/52 (!) 134/49 (!) 142/47 119/73  Pulse: 92 93 94 90  Resp: (!) 29 (!) 25 (!) 28 (!) 25  Temp:    98 F (36.7 C)  TempSrc:    Oral  SpO2: 95% 96% 96% 94%  Weight:      Height:         Intake/Output Summary (Last 24 hours) at 09/18/2020 1228 Last data filed at 09/18/2020 0824 Gross per 24 hour  Intake 240 ml  Output 2450 ml  Net -2210 ml   Filed Weights   09/16/20 0401 09/17/20 0344 09/18/20 0500  Weight: 118.1 kg 119.6 kg 119.6 kg    Physical Exam   GEN: laying on slight incline comfortably. drowsy HEENT: Grossly normal.  Neck: midly elevated JVD Cardiac: RRR, soft SEM, distant heart sounds. 1+ bilateral pretibial edema. Wrapped in unna boots Respiratory:  Respirations regular and unlabored, clear to auscultation bilaterally. GI: Soft, nontender, nondistended, BS + x 4. MS: no deformity or atrophy. Skin: warm and dry, no rash. Groins with improving erythema. Urethral meatus with a little purulent drainage at catheter site.  Neuro:  Strength and sensation are intact. Psych: AAOx3.  Normal affect.  Labs    CBC Recent Labs    09/17/20 0500 09/18/20 0500  WBC 9.2 8.2  HGB 12.2* 12.3*  HCT 38.3* 40.6  MCV 77.5* 78.8*  PLT 178 941   Basic Metabolic Panel Recent Labs    09/16/20 0419 09/17/20 0920 09/18/20 0500  NA 133* 134* 132*  K 4.5 4.4 4.4  CL 96* 99 98  CO2 25 26 25   GLUCOSE 138* 153* 115*  BUN 64* 50* 42*  CREATININE 2.06* 1.58* 1.44*  CALCIUM 8.1* 8.1*  8.3*  MG 2.3  --   --    Liver Function Tests Recent Labs    09/17/20 0920 09/18/20 0500  AST 21 21  ALT 19 19  ALKPHOS 64 63  BILITOT 1.8* 2.1*  PROT 6.7 6.5  ALBUMIN 2.7* 2.7*   No results for input(s): LIPASE, AMYLASE in the last 72 hours. Cardiac Enzymes No results for input(s): CKTOTAL, CKMB, CKMBINDEX, TROPONINI in the last 72 hours. BNP Invalid input(s): POCBNP D-Dimer No results for input(s): DDIMER in the last 72 hours. Hemoglobin A1C No results for input(s): HGBA1C in the last 72 hours. Fasting Lipid Panel No results for input(s): CHOL, HDL, LDLCALC, TRIG, CHOLHDL, LDLDIRECT in the last 72 hours. Thyroid Function Tests No results for input(s): TSH, T4TOTAL,  T3FREE, THYROIDAB in the last 72 hours.  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  Ost Cx to Prox Cx lesion is 40% stenosed.  Mid LAD lesion is 30% stenosed.  Findings: On dobutamine 2.5 Ao = 103/66 (82) LV = 214/35 RA = 13 RV = 63/15 PA = 79/31 (51) PCW = 32 (v = 43) Fick cardiac output/index = 6.3/2.7 PVR = 3.0 WU Ao sat = 96% PA sat = 66%, 67% PAPI = 3.7 AoV: Peak-to-peak gradient = 89 mm HG         Mean gradient = 60 mmHG         AVA = 0.71 cm2 Assessment: 1. Diffuse mild non-obstructive CAD 2. Critical calcific aortic stenosis 3. Markedly elevated filling pressure with normal cardiac output on dobutamine 4. 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 Continue diuresis. Continue TAVR w/u. Glori Bickers, MD 10:00 AM    Cardiac Studies   Echo 09/12/20 IMPRESSIONS  1. Left ventricular ejection fraction, by estimation, is 35 to 40%. The  left ventricle has moderately decreased function. The left ventricle  demonstrates regional wall motion abnormalities (see scoring  diagram/findings for description). There is mild  left ventricular hypertrophy. Left ventricular diastolic parameters are  consistent with Grade II diastolic dysfunction (pseudonormalization).  2. Right ventricular systolic function is severely reduced. The right  ventricular size is normal. Tricuspid regurgitation signal is inadequate  for assessing PA pressure.  3. The mitral valve is abnormal. Mild mitral valve regurgitation.  Moderate mitral annular calcification.  4. The aortic valve has an indeterminant number of cusps. There is severe  calcifcation of the aortic valve. Aortic valve regurgitation is not  visualized. Severe-critical aortic valve stenosis.  Aortic valve mean  gradient measures 65.7 mmHg. Aortic valve  Vmax measures 4.89 m/s.  5. The inferior vena cava is normal in size with <50% respiratory  variability, suggesting right atrial pressure of 8 mmHg.   FINDINGS  Left Ventricle: Left ventricular ejection fraction, by estimation, is 35  to 40%. The left ventricle has moderately decreased function. The left  ventricle demonstrates regional wall motion abnormalities. The left  ventricular internal cavity size was  normal in size. There is mild left ventricular hypertrophy. Left  ventricular diastolic parameters are consistent with Grade II diastolic  dysfunction (pseudonormalization).     LV Wall Scoring:  The anterior wall, entire lateral wall, and basal inferior segment are  akinetic. The mid and distal anterior septum, apical anterior segment,  basal  inferoseptal segment, and apex are hypokinetic. The mid and distal  inferior  wall, basal anteroseptal segment, and mid inferoseptal segment are normal.   Right Ventricle: The right ventricular size is normal. No increase in  right ventricular wall thickness. Right ventricular systolic function is  severely reduced. Tricuspid regurgitation signal is inadequate for  assessing PA pressure.   Left Atrium: Left atrial size was normal in size.   Right Atrium: Right atrial size was normal in size.   Pericardium: There is no evidence of pericardial effusion.   Mitral Valve: The mitral valve is abnormal. There is mild thickening of  the mitral valve leaflet(s). There is mild calcification of the mitral  valve leaflet(s). Moderate mitral annular calcification. Mild mitral valve  regurgitation.   Tricuspid Valve: The tricuspid valve is grossly normal. Tricuspid valve  regurgitation is trivial.   Aortic Valve: The aortic valve has an indeterminant number of cusps. There  is severe calcifcation of the aortic valve. Aortic valve regurgitation is  not visualized. Severe  aortic stenosis is present. Aortic valve mean  gradient measures 65.7 mmHg. Aortic  valve peak gradient measures 95.8 mmHg. Aortic valve area, by VTI measures  0.24 cm.   Pulmonic Valve: The pulmonic valve was grossly normal. Pulmonic valve  regurgitation is mild.   Aorta: The aortic root is normal in size and structure.   Venous: The inferior vena cava is normal in size with less than 50%  respiratory variability, suggesting right atrial pressure of 8 mmHg.   IAS/Shunts: No atrial level shunt detected by color flow Doppler.     LEFT VENTRICLE  PLAX 2D  LVIDd:     5.57 cm Diastology  LVIDs:     4.63 cm LV e' medial:  3.81 cm/s  LV PW:     1.24 cm LV E/e' medial: 29.7  LV IVS:    1.25 cm LV e' lateral:  4.95 cm/s  LVOT diam:   1.90 cm LV E/e' lateral: 22.8  LV SV:     28  LV SV Index:  12  LVOT Area:   2.84 cm     RIGHT VENTRICLE  RV S prime:   10.10 cm/s  TAPSE (M-mode): 2.4 cm   LEFT ATRIUM       Index    RIGHT ATRIUM      Index  LA diam:    4.80 cm 2.04 cm/m RA Area:   14.60 cm  LA Vol (A2C):  85.4 ml 36.35 ml/m RA Volume:  40.90 ml 17.41 ml/m  LA Vol (A4C):  69.9 ml 29.75 ml/m  LA Biplane Vol: 83.4 ml 35.50 ml/m  AORTIC VALVE  AV Area (Vmax):  0.27 cm  AV Area (Vmean):  0.24 cm  AV Area (VTI):   0.24 cm  AV Vmax:      489.33 cm/s  AV Vmean:     386.667 cm/s  AV VTI:      1.163 m  AV Peak Grad:   95.8 mmHg  AV Mean Grad:   65.7 mmHg  LVOT Vmax:     46.97 cm/s  LVOT Vmean:    33.167 cm/s  LVOT VTI:     0.099 m  LVOT/AV VTI ratio: 0.09    AORTA  Ao Root diam: 3.10 cm   MITRAL VALVE  MV Area (PHT): 3.13 cm   SHUNTS  MV Decel Time: 242 msec   Systemic VTI: 0.10 m  MR Peak grad: 105.3 mmHg  Systemic Diam: 1.90 cm  MR Mean grad: 68.0 mmHg  MR Vmax:   513.00 cm/s  MR Vmean:   379.0 cm/s  MV E velocity: 113.00 cm/s  MV A velocity:  79.30 cm/s  MV E/A ratio: 1.42   _______________________  [facility] 09/18/20 RIGHT/LEFT HEART CATH AND CORONARY ANGIOGRAPHY  Conclusion    Prox RCA lesion is 40% stenosed.  Ost Cx to Prox Cx lesion is 40% stenosed.  Mid LAD lesion is 30% stenosed.   Findings:  On dobutamine 2.5   Ao = 103/66 (82)  LV = 214/35 RA = 13 RV = 63/15 PA = 79/31 (51) PCW = 32 (v = 43) Fick cardiac output/index = 6.3/2.7 PVR = 3.0 WU Ao sat = 96% PA sat = 66%, 67% PAPI = 3.7  AoV: Peak-to-peak gradient = 89 mm HG         Mean gradient = 60 mmHG         AVA = 0.71 cm2 Assessment:  1. Diffuse mild non-obstructive CAD 2. Critical calcific aortic stenosis 3. Markedly elevated filling pressure with normal cardiac output on dobutamine 4. 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 Continue diuresis. Continue TAVR w/u.   Glori Bickers, MD  10:00 AM   Patient Profile     Brett Williamson is a 80 y.o. male with a hx of chronic diastolic CHF, severe AS (by echo in 06/2020), subdural hematoma in 06/2020 secondary to mechanical fall (s/p craniotomy hematoma evacuation), HTN and Type 2 DMwho was admitted to [facility] on 09[facility] with acute CHF and found to have newly reduced EF 35-40%, RV dysfunction and critical AS. He was transferred to [facility] for further cardiac work up and evaluation by the structural heart team.  Assessment & Plan    Critical AS: undergoing TAVR work up. L/RHC today showed mild diffuse non obstructive CAD, critical AS, markedly elevated filling pressures with normal CO on dobutamine and moderate to severe pulm HTN. Plan for possible staged CT scans Thursday if renal function stable. Will plan for TCTS to see Friday with tentative inpatient TAVR next Tuesday. Will forgo pre TAVR PT eval given critical AS. 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 Required dobutamine to augment cardiac output and allow diuresis in the setting of AKI. Currently auto-diuresing but may need a low dose diuretic.  AKI superimposed on CKD stage IIIB: creat peaked at 2.36. Now down to 1.44. Watch closely after contrast dye today with cath. Will plan for CT scans with low contrast protocol Thursday if creat remains stable.   Traumatic subdural hematoma: 11/21 with craniotomy/evacuation.  He is on SCDs for now for DVT prophylaxis.    Bilateral groin candidal infection: looks improved today. Has been treated with oral fluconazole and nystatin powder.   SignedAngelena Form, PA-C  09/18/2020, 12:28 PM  Pager [phone]  Patient seen, examined. Available data reviewed. Agree with findings, assessment, and plan as outlined by Nell Range, PA-C.  The patient is independently interviewed and examined.  His daughter is at the bedside.  He looks much better than he did at the end of last week.  HEENT is normal, JVP is normal, lung fields are clear, heart is regular rate and rhythm with a grade 3/6 high-pitched systolic murmur best heard at the apex, late peaking, abdomen is soft and nontender, extremities are  wrapped but there does not appear to be significant edema.  Right radial site is clear with TR band still in place.  Cardiac cath data reviewed.  This confirms critical aortic stenosis.  Hemodynamics demonstrate preserved cardiac output on low-dose dobutamine.  There is moderate pulmonary hypertension but a low transpulmonic gradient suggesting that left heart disease is the patient's primary problem.  His LVEDP is 35 mmHg and he has a peak to peak aortic valve gradient of 89 mmHg, mean gradient 60, and aortic valve area calculated at 0.7.  The patient has improved on dobutamine and continued IV diuresis.  Appreciate the care of the advanced heart failure service.  I think we can proceed with low contrast  cardiac CTA and CTA of the chest, abdomen, and pelvis in about 48 hours as long as his renal function will allow.  As long as the CTA studies demonstrate suitable anatomy for TAVR, I would anticipate proceeding next week.  The patient remains inotrope dependent and will need to remain inpatient while he undergoes further evaluation.  Sherren Mocha, M.D. 09/18/2020 3:23 PM

## 2020-09-18 NOTE — Plan of Care (Signed)

## 2020-09-18 NOTE — Progress Notes (Signed)
Patient ID: Brett Williamson, male   DOB: 1941-04-11, 80 y.o.   MRN: [id number]     Advanced Heart Failure Rounding Note  PCP-Cardiologist: No primary care provider on file.   Subjective:    He reports his breathing is stable today no dyspnea at rest.    Co-ox is 61%, CVP at 8 .  Weight stable again 2.5L out with no lasix.  Creatinine continuing to improve from peak of 2.36 now 1.44  Objective:   Weight Range: 119.6 kg Body mass index is 36.77 kg/m.   Vital Signs:   Temp:  [97.5 F (36.4 C)-98.6 F (37 C)] 98.3 F (36.8 C) (02/08 0400) Pulse Rate:  [71-91] 81 (02/08 0700) Resp:  [19-26] 25 (02/08 0700) BP: (102-122)/(64-85) 107/85 (02/08 0400) SpO2:  [90 %-95 %] 93 % (02/08 0700) Weight:  [119.6 kg] 119.6 kg (02/08 0500) Last BM Date: 09/16/20  Weight change: Filed Weights   09/16/20 0401 09/17/20 0344 09/18/20 0500  Weight: 118.1 kg 119.6 kg 119.6 kg    Intake/Output:   Intake/Output Summary (Last 24 hours) at 09/18/2020 0738 Last data filed at 09/18/2020 0450 Gross per 24 hour  Intake 480 ml  Output 3050 ml  Net -2570 ml      Physical Exam    Cardiac: JVD difficult to assess, normal rate and rhythm, clear s1 and s2, 3/6 AS murmur, no rubs or gallops, LE warm, wrapped in unna boots bilaterally Pulmonary: decreased in bases upper fields clear, not in distress Abdominal: non distended abdomen, soft and nontender Psych: Alert, conversant, in good spirits   Telemetry   NSR 60-70s with PVCs.    Labs    CBC Recent Labs    09/17/20 0500 09/18/20 0500  WBC 9.2 8.2  HGB 12.2* 12.3*  HCT 38.3* 40.6  MCV 77.5* 78.8*  PLT 178 458   Basic Metabolic Panel Recent Labs    09/16/20 0419 09/17/20 0920 09/18/20 0500  NA 133* 134* 132*  K 4.5 4.4 4.4  CL 96* 99 98  CO2 25 26 25   GLUCOSE 138* 153* 115*  BUN 64* 50* 42*  CREATININE 2.06* 1.58* 1.44*  CALCIUM 8.1* 8.1* 8.3*  MG 2.3  --   --    Liver Function Tests Recent Labs    09/17/20 0920 09/18/20 0500   AST 21 21  ALT 19 19  ALKPHOS 64 63  BILITOT 1.8* 2.1*  PROT 6.7 6.5  ALBUMIN 2.7* 2.7*   No results for input(s): LIPASE, AMYLASE in the last 72 hours. Cardiac Enzymes No results for input(s): CKTOTAL, CKMB, CKMBINDEX, TROPONINI in the last 72 hours.  BNP: BNP (last 3 results) Recent Labs    09/11/20 1600 09/12/20 0708  BNP 1,413.0* 1,561.0*    ProBNP (last 3 results) No results for input(s): PROBNP in the last 8760 hours.   D-Dimer No results for input(s): DDIMER in the last 72 hours. Hemoglobin A1C No results for input(s): HGBA1C in the last 72 hours. Fasting Lipid Panel No results for input(s): CHOL, HDL, LDLCALC, TRIG, CHOLHDL, LDLDIRECT in the last 72 hours. Thyroid Function Tests No results for input(s): TSH, T4TOTAL, T3FREE, THYROIDAB in the last 72 hours.  Invalid input(s): FREET3  Other results:   Imaging    No results found.   Medications:     Scheduled Medications: . aspirin EC  81 mg Oral Daily  . Chlorhexidine Gluconate Cloth  6 each Topical Daily  . fluconazole  100 mg Oral Daily  . insulin aspart  0-15  Units Subcutaneous TID WC  . insulin aspart  0-5 Units Subcutaneous QHS  . mouth rinse  15 mL Mouth Rinse BID  . nystatin   Topical TID  . sodium chloride flush  10-40 mL Intracatheter Q12H  . sodium chloride flush  3 mL Intravenous Q12H  . sodium chloride flush  3 mL Intravenous Q12H    Infusions: . sodium chloride 10 mL/hr at 09/15/20 0400  . DOBUTamine 2.5 mcg/kg/min (09/16/20 1946)    PRN Medications: sodium chloride, acetaminophen, Gerhardt's butt cream, guaiFENesin-dextromethorphan, ondansetron (ZOFRAN) IV, sodium chloride flush    Assessment/Plan   1. Acute on chronic combined systolic and diastolic CHF:  ECHO 58/8502 with normal EF, severe aortic stenosis  AVA 0.57 cm2 and mean gradient of 51.73mmHg G2DD.  ECHO 09/12/20 with EF down to 35-40%, worsening of AVA to 0.24 cm2 and mean gradient now 65.7 mmHg, new RWMA, new  severely reduced RV function.  Likely due to worsening AS but concern for an ischemic component as well.   -Lasix stopped 2/5 he has been auto diuresing net neg 2.5L but weight up slightly, cvp up a bit to 10-12 but patient reports breathing well -Continue dobutamine 2.5.  -Await cmp results may do mild diuretic to keep slightly negative vs let him continue to auto diurese.   2. Severe Aortic Stenosis:  Critical stenosis, significantly progressed since 06/2020. By echo, worsening of AVA to 0.24 cm2 and mean gradient now 65.7 mmHg - Will try to optimize patient so he can complete workup for possible TAVR, Dr Burt Knack following.  - Will need CT scans and right/left heart cath, discuss timing with Dr. Burt Knack.  Would like to see creatinine continue to trend down prior to contrast load.  3. AKI on CKD 3b: Baseline cr around 1.6. peaked at 2.36  But improving - Continue dobutamine.  -Await cmp results may need mild diuretic to keep slightly negative vs let him continue to auto diurese.   4. Traumatic subdural hematoma: 11/21 with craniotomy/evacuation.  He is on SCDs for now for DVT prophylaxis.   .  Length of Stay: [place], MD  09/18/2020, 7:38 AM  Advanced Heart Failure Team Pager [phone] (M-F; 7a - 4p)  Please contact [facility] for night-coverage after hours (4p -7a ) and weekends on [email or web address]   Patient seen and examined with the above-signed Advanced Practice Provider and/or Housestaff. I personally reviewed laboratory data, imaging studies and relevant notes. I independently examined the patient and formulated the important aspects of the plan. I have edited the note to reflect any of my changes or salient points. I have personally discussed the plan with the patient and/or family.  Continues to improve with dobutamine. Has diuresed well but weight actually up. CVP 8. Renal function improved. Denies CP. SOB has improved greatly. No orthopnea or PND  General: Sitting up in  bed. No resp difficulty HEENT: normal Neck: supple. JVP 8 Carotids 2+ bilat; + bruits. No lymphadenopathy or thryomegaly appreciated. Cor: PMI nondisplaced. Regular rate & rhythm. 3/6 AS s2 absent Lungs: clear Abdomen: soft, nontender, nondistended. No hepatosplenomegaly. No bruits or masses. Good bowel sounds. Extremities: no cyanosis, clubbing, rash, tr edema + UNNA boots Neuro: alert & orientedx3, cranial nerves grossly intact. moves all 4 extremities w/o difficulty. Affect pleasant  Improving with dobutamine. Will plan R/L cath today as part of TAVR work-up. Minimize contrast. Will need TAVR scans as well. Needs aggressive PT/OT.   Glori Bickers, MD  8:26 AM

## 2020-09-19 LAB — COOXEMETRY PANEL
Carboxyhemoglobin: 1.5 % (ref 0.5–1.5)
Methemoglobin: 1 % (ref 0.0–1.5)
O2 Saturation: 61 %
Total hemoglobin: 13.2 g/dL (ref 12.0–16.0)

## 2020-09-19 LAB — GLUCOSE, CAPILLARY
Glucose-Capillary: 96 mg/dL (ref 70–99)
Glucose-Capillary: 123 mg/dL — ABNORMAL HIGH (ref 70–99)
Glucose-Capillary: 120 mg/dL — ABNORMAL HIGH (ref 70–99)
Glucose-Capillary: 158 mg/dL — ABNORMAL HIGH (ref 70–99)

## 2020-09-19 LAB — COMPREHENSIVE METABOLIC PANEL
ALT: 22 U/L (ref 0–44)
AST: 23 U/L (ref 15–41)
Albumin: 2.9 g/dL — ABNORMAL LOW (ref 3.5–5.0)
Alkaline Phosphatase: 65 U/L (ref 38–126)
Anion gap: 11 (ref 5–15)
BUN: 42 mg/dL — ABNORMAL HIGH (ref 8–23)
CO2: 26 mmol/L (ref 22–32)
Calcium: 8.6 mg/dL — ABNORMAL LOW (ref 8.9–10.3)
Chloride: 96 mmol/L — ABNORMAL LOW (ref 98–111)
Creatinine, Ser: 1.55 mg/dL — ABNORMAL HIGH (ref 0.61–1.24)
GFR, Estimated: 45 mL/min — ABNORMAL LOW (ref >60)
Glucose, Bld: 112 mg/dL — ABNORMAL HIGH (ref 70–99)
Potassium: 4.4 mmol/L (ref 3.5–5.1)
Sodium: 133 mmol/L — ABNORMAL LOW (ref 135–145)
Total Bilirubin: 2 mg/dL — ABNORMAL HIGH (ref 0.3–1.2)
Total Protein: 7.1 g/dL (ref 6.5–8.1)

## 2020-09-19 LAB — CBC
HCT: 41.1 % (ref 39.0–52.0)
Hemoglobin: 13.1 g/dL (ref 13.0–17.0)
MCH: 24.8 pg — ABNORMAL LOW (ref 26.0–34.0)
MCHC: 31.9 g/dL (ref 30.0–36.0)
MCV: 77.8 fL — ABNORMAL LOW (ref 80.0–100.0)
Platelets: 197 10*3/uL (ref 150–400)
RBC: 5.28 MIL/uL (ref 4.22–5.81)
RDW: 14.8 % (ref 11.5–15.5)
WBC: 8.4 10*3/uL (ref 4.0–10.5)
nRBC: 0 % (ref 0.0–0.2)

## 2020-09-19 MED ORDER — FUROSEMIDE 10 MG/ML IJ SOLN: 80.0000 mg | Freq: Every day | INTRAMUSCULAR | Status: DC

## 2020-09-19 NOTE — Progress Notes (Addendum)
Patient ID: Brett Williamson, male   DOB: Aug 10, 1941, 80 y.o.   MRN: [id number]     Advanced Heart Failure Rounding Note  PCP-Cardiologist: No primary care provider on file.   Subjective:    Says he is breathing about the same comfortable when resting.    Co-ox is 61%, CVP at 8 .  Started back on lasix after R/LHC 4L output yesterday during the day probably a similar amount in the evening, weight down about 11lbs.  Creatinine stable at 1.55 from peak of 2.36   R/LHC results:   1. Diffuse mild non-obstructive CAD 2. Critical calcific aortic stenosis 3. Markedly elevated filling pressure with normal cardiac output on dobutamine 4. 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   Vital Signs:   Temp:  [98 F (36.7 C)-99 F (37.2 C)] 98.2 F (36.8 C) (02/09 0407) Pulse Rate:  [66-94] 89 (02/09 0407) Resp:  [20-29] 20 (02/09 0407) BP: (102-142)/(36-73) 111/42 (02/09 0407) SpO2:  [90 %-98 %] 95 % (02/09 0407) Weight:  [114.3 kg] 114.3 kg (02/09 0407) Last BM Date: 09/16/20  Weight change: Filed Weights   09/17/20 0344 09/18/20 0500 09/19/20 0407  Weight: 119.6 kg 119.6 kg 114.3 kg    Intake/Output:   Intake/Output Summary (Last 24 hours) at 09/19/2020 7824 Last data filed at 09/18/2020 2331 Gross per 24 hour  Intake 950.32 ml  Output 3870 ml  Net -2919.68 ml      Physical Exam    Cardiac: JVD difficult to assess, normal rate and rhythm, 3/6 high pitched AS murmur, no rubs or gallops, LE warm, wrapped in unna boots bilaterally Pulmonary: decreased in bases upper fields clear, not in distress Abdominal: non distended abdomen, soft and nontender Psych: Alert, conversant, in good spirits   Telemetry   NSR 80-90's with PVCs.    Labs    CBC Recent Labs    09/18/20 0500 09/18/20 0920 09/18/20 0923 09/19/20 0429  WBC 8.2  --   --  8.4  HGB 12.3*   < > 13.9  13.3 13.1  HCT 40.6   < > 41.0  39.0 41.1   MCV 78.8*  --   --  77.8*  PLT 181  --   --  197   < > = values in this interval not displayed.   Basic Metabolic Panel Recent Labs    09/18/20 0500 09/18/20 0920 09/18/20 0923 09/19/20 0429  NA 132*   < > 137  139 133*  K 4.4   < > 4.4  4.1 4.4  CL 98  --   --  96*  CO2 25  --   --  26  GLUCOSE 115*  --   --  112*  BUN 42*  --   --  42*  CREATININE 1.44*  --   --  1.55*  CALCIUM 8.3*  --   --  8.6*   < > = values in this interval not displayed.   Liver Function Tests Recent Labs    09/18/20 0500 09/19/20 0429  AST 21 23  ALT 19 22  ALKPHOS 63 65  BILITOT 2.1* 2.0*  PROT 6.5 7.1  ALBUMIN 2.7* 2.9*   No results for input(s): LIPASE, AMYLASE in the last 72 hours. Cardiac Enzymes No results for input(s): CKTOTAL, CKMB, CKMBINDEX, TROPONINI in the last 72 hours.  BNP: BNP (last 3 results) Recent Labs    09/11/20 1600 09/12/20  0708  BNP 1,413.0* 1,561.0*    ProBNP (last 3 results) No results for input(s): PROBNP in the last 8760 hours.   D-Dimer No results for input(s): DDIMER in the last 72 hours. Hemoglobin A1C No results for input(s): HGBA1C in the last 72 hours. Fasting Lipid Panel No results for input(s): CHOL, HDL, LDLCALC, TRIG, CHOLHDL, LDLDIRECT in the last 72 hours. Thyroid Function Tests No results for input(s): TSH, T4TOTAL, T3FREE, THYROIDAB in the last 72 hours.  Invalid input(s): FREET3  Other results:   Imaging    CARDIAC CATHETERIZATION  Result Date: 09/18/2020  Prox RCA lesion is 40% stenosed.  Ost Cx to Prox Cx lesion is 40% stenosed.  Mid LAD lesion is 30% stenosed.  Findings: On dobutamine 2.5 Ao = 103/66 (82) LV = 214/35 RA = 13 RV = 63/15 PA = 79/31 (51) PCW = 32 (v = 43) Fick cardiac output/index = 6.3/2.7 PVR = 3.0 WU Ao sat = 96% PA sat = 66%, 67% PAPI = 3.7 AoV: Peak-to-peak gradient = 89 mm HG         Mean gradient = 60 mmHG         AVA = 0.71 cm2 Assessment: 1. Diffuse mild non-obstructive CAD 2. Critical calcific  aortic stenosis 3. Markedly elevated filling pressure with normal cardiac output on dobutamine 4. 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 Continue diuresis. Continue TAVR w/u. Glori Bickers, MD 10:00 AM     Medications:     Scheduled Medications: . aspirin EC  81 mg Oral Daily  . Chlorhexidine Gluconate Cloth  6 each Topical Daily  . enoxaparin (LOVENOX) injection  40 mg Subcutaneous Q24H  . fluconazole  100 mg Oral Daily  . furosemide  80 mg Intravenous BID  . insulin aspart  0-15 Units Subcutaneous TID WC  . insulin aspart  0-5 Units Subcutaneous QHS  . mouth rinse  15 mL Mouth Rinse BID  . nystatin   Topical TID  . sodium chloride flush  10-40 mL Intracatheter Q12H  . sodium chloride flush  3 mL Intravenous Q12H  . sodium chloride flush  3 mL Intravenous Q12H    Infusions: . sodium chloride 10 mL/hr at 09/15/20 0400  . sodium chloride    . DOBUTamine 2.5 mcg/kg/min (09/16/20 1946)    PRN Medications: sodium chloride, sodium chloride, acetaminophen, Gerhardt's butt cream, guaiFENesin-dextromethorphan, ondansetron (ZOFRAN) IV, sodium chloride flush, sodium chloride flush    Assessment/Plan   1. 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  ECHO 09/12/20 with EF down to 35-40%, worsening of AVA to 0.24 cm2 and mean gradient now 65.7 mmHg, new RWMA, new severely reduced RV function.  Likely due to worsening AS He has diffuse non obstructive CAD by LHC on 09/18/20.     -Lasix restarted 2/8 after cath with brisk diuresis afterwards weight down 11lbs today, CVP down to 8, already received morning dose,will decrease lasix to daily for now holding afternoon dose and will follow renal function -Continue dobutamine 2.5.   2. Severe Aortic Stenosis:  Critical stenosis, significantly progressed since 06/2020. By echo,  worsening of AVA to 0.24 cm2 and mean gradient now 65.7 mmHg - Will try to optimize patient so he can complete workup for possible TAVR, Dr Burt Knack following.  - R/LHC performed yesterday plan will remain TAVR based on results, Hopefully CT scans in the coming  days  3. AKI on CKD 3b: Baseline cr around 1.6. peaked at 2.36  Now stable at baseline - Continue dobutamine.  - diuresing watching cr closely  4. Hx of Traumatic subdural hematoma: 11/21 with craniotomy/evacuation.  He is on SCDs for now for DVT prophylaxis.   .  Length of Stay: 8  Katherine Roan, MD  09/19/2020, 7:33 AM  Advanced Heart Failure Team Pager [phone] (M-F; [place])  Please contact [facility] for night-coverage after hours (4p -7a ) and weekends on [email or web address]   Patient seen and examined with the above-signed Advanced Practice Provider and/or Housestaff. I personally reviewed laboratory data, imaging studies and relevant notes. I independently examined the patient and formulated the important aspects of the plan. I have edited the note to reflect any of my changes or salient points. I have personally discussed the plan with the patient and/or family.  Remains on dobutamine. Results of cath reviewed with him and Structural team. Remains on IV lasix. Feeling better. No orthopnea or PND. Renal function stable.   General:  Sitting up in bed  No resp difficulty HEENT: normal Neck: supple. JVP 7. Carotids 2+ bilat; + bruits. No lymphadenopathy or thryomegaly appreciated. Cor: PMI nondisplaced. Regular rate & rhythm. 3/6 AS s2 obliterated Lungs: clear Abdomen: soft, nontender, nondistended. No hepatosplenomegaly. No bruits or masses. Good bowel sounds. Extremities: no cyanosis, clubbing, rash, tr edema Neuro: alert & orientedx3, cranial nerves grossly intact. moves all 4 extremities w/o difficulty. Affect pleasant  Continue dobutamine. Will hold IV lasix tonight. Plan pre-TAVR CTs tomorrow. Possible TAVR next week.  Needs aggressive PT/OT.   Glori Bickers, MD  4:53 PM

## 2020-09-19 NOTE — Plan of Care (Signed)

## 2020-09-20 ENCOUNTER — Inpatient Hospital Stay (HOSPITAL_COMMUNITY): Payer: Medicare Other

## 2020-09-20 DIAGNOSIS — I350 Nonrheumatic aortic (valve) stenosis: Secondary | ICD-10-CM

## 2020-09-20 LAB — COMPREHENSIVE METABOLIC PANEL
ALT: 27 U/L (ref 0–44)
AST: 27 U/L (ref 15–41)
Albumin: 2.9 g/dL — ABNORMAL LOW (ref 3.5–5.0)
Alkaline Phosphatase: 71 U/L (ref 38–126)
Anion gap: 11 (ref 5–15)
BUN: 41 mg/dL — ABNORMAL HIGH (ref 8–23)
CO2: 27 mmol/L (ref 22–32)
Calcium: 8.6 mg/dL — ABNORMAL LOW (ref 8.9–10.3)
Chloride: 94 mmol/L — ABNORMAL LOW (ref 98–111)
Creatinine, Ser: 1.71 mg/dL — ABNORMAL HIGH (ref 0.61–1.24)
GFR, Estimated: 40 mL/min — ABNORMAL LOW (ref >60)
Glucose, Bld: 126 mg/dL — ABNORMAL HIGH (ref 70–99)
Potassium: 4.1 mmol/L (ref 3.5–5.1)
Sodium: 132 mmol/L — ABNORMAL LOW (ref 135–145)
Total Bilirubin: 1.7 mg/dL — ABNORMAL HIGH (ref 0.3–1.2)
Total Protein: 7.3 g/dL (ref 6.5–8.1)

## 2020-09-20 LAB — CBC
HCT: 44.9 % (ref 39.0–52.0)
Hemoglobin: 13.7 g/dL (ref 13.0–17.0)
MCH: 24 pg — ABNORMAL LOW (ref 26.0–34.0)
MCHC: 30.5 g/dL (ref 30.0–36.0)
MCV: 78.8 fL — ABNORMAL LOW (ref 80.0–100.0)
Platelets: 219 10*3/uL (ref 150–400)
RBC: 5.7 MIL/uL (ref 4.22–5.81)
RDW: 15.1 % (ref 11.5–15.5)
WBC: 9 10*3/uL (ref 4.0–10.5)
nRBC: 0 % (ref 0.0–0.2)

## 2020-09-20 LAB — COOXEMETRY PANEL
Carboxyhemoglobin: 1.4 % (ref 0.5–1.5)
Methemoglobin: 0.9 % (ref 0.0–1.5)
O2 Saturation: 65.4 %
Total hemoglobin: 14 g/dL (ref 12.0–16.0)

## 2020-09-20 LAB — GLUCOSE, CAPILLARY
Glucose-Capillary: 129 mg/dL — ABNORMAL HIGH (ref 70–99)
Glucose-Capillary: 138 mg/dL — ABNORMAL HIGH (ref 70–99)

## 2020-09-20 MED ORDER — IOHEXOL 350 MG/ML SOLN
80.0000 mL | Freq: Once | INTRAVENOUS | Status: AC | PRN
  Administered 2020-09-20: 80 mL via INTRAVENOUS

## 2020-09-20 NOTE — Progress Notes (Signed)
0810 Requested PT/OT consult to assist with strengthening and mobility. We will follow their progress with pt. Graylon Good RN BSN 09/20/2020 9:06 AM

## 2020-09-20 NOTE — Progress Notes (Addendum)
Patient ID: Brett Williamson, male   DOB: Jul 19, 1941, 80 y.o.   MRN: [id number]     Advanced Heart Failure Rounding Note  PCP-Cardiologist: No primary care provider on file.   Subjective:    Says he is breathing comfortably today. 93% on room air.    Co-ox is 65%, CVP down to 5.  Brisk diuresis yesterday even with reduced dose of lasix.  Creatinine up a bit 1.55-->1.71   Objective:   Weight Range: 113.2 kg Body mass index is 34.81 kg/m.   Vital Signs:   Temp:  [97.9 F (36.6 C)-98.2 F (36.8 C)] 97.9 F (36.6 C) (02/10 0359) Pulse Rate:  [69-81] 81 (02/10 0359) Resp:  [20-27] 20 (02/10 0359) BP: (111-131)/(37-52) 111/48 (02/10 0359) SpO2:  [93 %-95 %] 94 % (02/10 0359) Weight:  [113.2 kg] 113.2 kg (02/10 0359) Last BM Date: 09/16/20  Weight change: Filed Weights   09/18/20 0500 09/19/20 0407 09/20/20 0359  Weight: 119.6 kg 114.3 kg 113.2 kg    Intake/Output:   Intake/Output Summary (Last 24 hours) at 09/20/2020 0901 Last data filed at 09/20/2020 0739 Gross per 24 hour  Intake 514.37 ml  Output 4950 ml  Net -4435.63 ml      Physical Exam    Cardiac: JVD flat, normal rate and rhythm, 3/6 high pitched AS murmur, no rubs or gallops, LE warm, wrapped in unna boots bilaterally Pulmonary: clear, not in distress Abdominal: non distended abdomen, soft and nontender Psych: Alert, conversant, in good spirits   Telemetry   NSR 80-90's with PVCs.    Labs    CBC Recent Labs    09/19/20 0429 09/20/20 0645  WBC 8.4 9.0  HGB 13.1 13.7  HCT 41.1 44.9  MCV 77.8* 78.8*  PLT 197 831   Basic Metabolic Panel Recent Labs    09/19/20 0429 09/20/20 0645  NA 133* 132*  K 4.4 4.1  CL 96* 94*  CO2 26 27  GLUCOSE 112* 126*  BUN 42* 41*  CREATININE 1.55* 1.71*  CALCIUM 8.6* 8.6*   Liver Function Tests Recent Labs    09/19/20 0429 09/20/20 0645  AST 23 27  ALT 22 27  ALKPHOS 65 71  BILITOT 2.0* 1.7*  PROT 7.1 7.3  ALBUMIN 2.9* 2.9*   No results for input(s):  LIPASE, AMYLASE in the last 72 hours. Cardiac Enzymes No results for input(s): CKTOTAL, CKMB, CKMBINDEX, TROPONINI in the last 72 hours.  BNP: BNP (last 3 results) Recent Labs    09/11/20 1600 09/12/20 0708  BNP 1,413.0* 1,561.0*    ProBNP (last 3 results) No results for input(s): PROBNP in the last 8760 hours.   D-Dimer No results for input(s): DDIMER in the last 72 hours. Hemoglobin A1C No results for input(s): HGBA1C in the last 72 hours. Fasting Lipid Panel No results for input(s): CHOL, HDL, LDLCALC, TRIG, CHOLHDL, LDLDIRECT in the last 72 hours. Thyroid Function Tests No results for input(s): TSH, T4TOTAL, T3FREE, THYROIDAB in the last 72 hours.  Invalid input(s): FREET3  Other results:  R/LHC results 09/18/20:   1. Diffuse mild non-obstructive CAD 2. Critical calcific aortic stenosis 3. Markedly elevated filling pressure with normal cardiac output on dobutamine 4. 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   Medications:     Scheduled Medications: . aspirin EC  81 mg Oral Daily  . Chlorhexidine Gluconate Cloth  6 each Topical Daily  . fluconazole  100 mg Oral Daily  . insulin aspart  0-15 Units Subcutaneous TID WC  . insulin aspart  0-5 Units Subcutaneous QHS  . mouth rinse  15 mL Mouth Rinse BID  . nystatin   Topical TID  . sodium chloride flush  10-40 mL Intracatheter Q12H  . sodium chloride flush  3 mL Intravenous Q12H  . sodium chloride flush  3 mL Intravenous Q12H    Infusions: . sodium chloride 10 mL/hr at 09/15/20 0400  . sodium chloride    . DOBUTamine 2.5 mcg/kg/min (09/19/20 1700)    PRN Medications: sodium chloride, sodium chloride, acetaminophen, Gerhardt's butt cream, guaiFENesin-dextromethorphan, ondansetron (ZOFRAN) IV, sodium chloride flush, sodium chloride flush    Assessment/Plan   1. 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  ECHO 09/12/20 with EF down to 35-40%, worsening of AVA to 0.24 cm2 and mean gradient now 65.7 mmHg, new RWMA, new severely reduced RV function.  Likely due to worsening AS He has diffuse non obstructive CAD by LHC on 09/18/20.     -Output 5L documented yesterday and weight down another 2.5lbs, CVP down to 5 slight bump in cr hold lasix today -Continue dobutamine 2.5.   2. Severe Aortic Stenosis:  Critical stenosis, significantly progressed since 06/2020. By echo, worsening of AVA to 0.24 cm2 and mean gradient now 65.7 mmHg - Will try to optimize patient so he can complete workup for possible TAVR, Dr Burt Knack following.  - R/LHC done on 2/8,  CT scans ordered for today  3. AKI on CKD 3b: Baseline cr around 1.6. peaked at 2.36  Currently staying around baseline - Continue dobutamine.  - holding lasix today  4. Hx of Traumatic subdural hematoma: 11/21 with craniotomy/evacuation.  He is on SCDs for now for DVT prophylaxis.    Length of Stay: 9  Katherine Roan, MD  09/20/2020, 9:01 AM  Advanced Heart Failure Team Pager [phone] (M-F; [place])  Please contact [facility] for night-coverage after hours (4p -7a ) and weekends on [email or web address]  Patient seen and examined with the above-signed Advanced Practice Provider and/or Housestaff. I personally reviewed laboratory data, imaging studies and relevant notes. I independently examined the patient and formulated the important aspects of the plan. I have edited the note to reflect any of my changes or salient points. I have personally discussed the plan with the patient and/or family.  Feeling better. Excellent diuresis with 1 dose lasix yesterday. Remains on dobutamine. Co-ox 65%  CVP 5. Creatinine up slightly.   General:  Lying in bed  No resp difficulty HEENT: normal Neck: supple. no JVD. Carotids 2+ bilat; + bruits. No lymphadenopathy or thryomegaly appreciated. Cor: PMI nondisplaced.  Regular rate & rhythm. 3/6 AS no s2 No rubs, gallops or murmurs. Lungs: clear Abdomen: soft, nontender, nondistended. No hepatosplenomegaly. No bruits or masses. Good bowel sounds. Extremities: no cyanosis, clubbing, rash, edema Neuro: alert & orientedx3, cranial nerves grossly intact. moves all 4 extremities w/o difficulty. Affect pleasant  I think he is as dry as we can get him. Off lasix now. Continue dobutamine. Ok for low-dose contrast pre-TAVR CTs today. D/w Stuctural team. Continue PT/OT.   Glori Bickers, MD  9:22 AM

## 2020-09-20 NOTE — Progress Notes (Addendum)
[facility] VALVE TEAM  Patient Name: Brett Williamson Date of Encounter: 09/20/2020  Primary Cardiologist: Dr. Jesse Sans Problem List     Principal Problem:   Acute on chronic diastolic CHF (congestive heart failure) ([place]) Active Problems:   History of subdural hematoma   Essential hypertension   Controlled type 2 diabetes mellitus with hyperglycemia, without long-term current use of insulin (HCC)   Elevated troponin   Physical deconditioning   Candidiasis of skin   Acute cystitis   Chronic stasis dermatitis   Chronic renal failure, stage 3b (HCC)     Subjective   No complaints. Breathing better.   Inpatient Medications    Scheduled Meds: . aspirin EC  81 mg Oral Daily  . Chlorhexidine Gluconate Cloth  6 each Topical Daily  . fluconazole  100 mg Oral Daily  . insulin aspart  0-15 Units Subcutaneous TID WC  . insulin aspart  0-5 Units Subcutaneous QHS  . mouth rinse  15 mL Mouth Rinse BID  . nystatin   Topical TID  . sodium chloride flush  10-40 mL Intracatheter Q12H  . sodium chloride flush  3 mL Intravenous Q12H  . sodium chloride flush  3 mL Intravenous Q12H   Continuous Infusions: . sodium chloride 10 mL/hr at 09/15/20 0400  . sodium chloride    . DOBUTamine 2.5 mcg/kg/min (09/19/20 1700)   PRN Meds: sodium chloride, sodium chloride, acetaminophen, Gerhardt's butt cream, guaiFENesin-dextromethorphan, ondansetron (ZOFRAN) IV, sodium chloride flush, sodium chloride flush   Vital Signs    Vitals:   09/19/20 2000 09/19/20 2014 09/19/20 2353 09/20/20 0359  BP: (!) 131/52 (!) 123/37 (!) 121/39 (!) 111/48  Pulse: 69 73 72 81  Resp: (!) 27 20 20 20   Temp:  98.2 F (36.8 C) 98.2 F (36.8 C) 97.9 F (36.6 C)  TempSrc:  Oral Oral Oral  SpO2: 93% 95% 93% 94%  Weight:    113.2 kg  Height:        Intake/Output Summary (Last 24 hours) at 09/20/2020 1115 Last data filed at 09/20/2020 0739 Gross per 24 hour  Intake  511.37 ml  Output 4950 ml  Net -4438.63 ml   Filed Weights   09/18/20 0500 09/19/20 0407 09/20/20 0359  Weight: 119.6 kg 114.3 kg 113.2 kg    Physical Exam   GEN: laying on slight incline comfortably. drowsy HEENT: Grossly normal.  Neck: midly elevated JVD Cardiac: RRR, 3/6 SEM. trace bilateral pretibial edema. Wrapped in unna boots Respiratory:  Respirations regular and unlabored, clear to auscultation bilaterally. GI: Soft, nontender, nondistended, BS + x 4. MS: no deformity or atrophy. Skin: warm and dry, no rash. Groins with improving erythema. Urethral meatus with a little purulent drainage at catheter site.  Neuro:  Strength and sensation are intact. Psych: AAOx3.  Normal affect.  Labs    CBC Recent Labs    09/19/20 0429 09/20/20 0645  WBC 8.4 9.0  HGB 13.1 13.7  HCT 41.1 44.9  MCV 77.8* 78.8*  PLT 197 277   Basic Metabolic Panel Recent Labs    09/19/20 0429 09/20/20 0645  NA 133* 132*  K 4.4 4.1  CL 96* 94*  CO2 26 27  GLUCOSE 112* 126*  BUN 42* 41*  CREATININE 1.55* 1.71*  CALCIUM 8.6* 8.6*   Liver Function Tests Recent Labs    09/19/20 0429 09/20/20 0645  AST 23 27  ALT 22 27  ALKPHOS 65 71  BILITOT 2.0* 1.7*  PROT 7.1 7.3  ALBUMIN 2.9* 2.9*   No results for input(s): LIPASE, AMYLASE in the last 72 hours. Cardiac Enzymes No results for input(s): CKTOTAL, CKMB, CKMBINDEX, TROPONINI in the last 72 hours. BNP Invalid input(s): POCBNP D-Dimer No results for input(s): DDIMER in the last 72 hours. Hemoglobin A1C No results for input(s): HGBA1C in the last 72 hours. Fasting Lipid Panel No results for input(s): CHOL, HDL, LDLCALC, TRIG, CHOLHDL, LDLDIRECT in the last 72 hours. Thyroid Function Tests No results for input(s): TSH, T4TOTAL, T3FREE, THYROIDAB in the last 72 hours.  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  Cardiac Studies   Echo 09/12/20 IMPRESSIONS  1. Left ventricular ejection fraction, by estimation, is 35 to 40%. The  left ventricle has moderately decreased function. The left ventricle  demonstrates regional wall motion abnormalities (see scoring  diagram/findings for description). There is mild  left ventricular hypertrophy. Left ventricular diastolic parameters are  consistent with Grade II diastolic dysfunction (pseudonormalization).  2. Right ventricular systolic function is severely reduced. The right  ventricular size is normal. Tricuspid regurgitation signal is inadequate  for assessing PA pressure.  3. The mitral valve is abnormal. Mild mitral valve regurgitation.  Moderate mitral annular calcification.  4. The aortic valve has an indeterminant number of cusps. There is severe  calcifcation of the aortic valve. Aortic valve regurgitation is not  visualized. Severe-critical aortic valve stenosis. Aortic valve mean  gradient measures 65.7 mmHg. Aortic valve  Vmax measures 4.89 m/s.  5. The inferior vena cava is normal in size with <50% respiratory  variability, suggesting right atrial pressure of 8 mmHg.   FINDINGS  Left Ventricle: Left ventricular ejection fraction, by estimation, is 35  to 40%. The left ventricle has moderately decreased function. The left  ventricle demonstrates regional wall motion abnormalities. The left  ventricular internal cavity size was  normal in size. There is mild left ventricular hypertrophy. Left  ventricular diastolic parameters are consistent with Grade II diastolic  dysfunction (pseudonormalization).     LV Wall Scoring:  The anterior wall, entire lateral wall, and basal inferior segment are  akinetic. The mid and distal anterior septum, apical anterior segment,  basal  inferoseptal segment, and apex are hypokinetic. The mid and distal  inferior  wall, basal anteroseptal segment, and  mid inferoseptal segment are normal.   Right Ventricle: The right ventricular size is normal. No increase in  right ventricular wall thickness. Right ventricular systolic function is  severely reduced. Tricuspid regurgitation signal is inadequate for  assessing PA pressure.   Left Atrium: Left atrial size was normal in size.   Right Atrium: Right atrial size was normal in size.   Pericardium: There is no evidence of pericardial effusion.   Mitral Valve: The mitral valve is abnormal. There is mild thickening of  the mitral valve leaflet(s). There is mild calcification of the mitral  valve leaflet(s). Moderate mitral annular calcification. Mild mitral valve  regurgitation.   Tricuspid Valve: The tricuspid valve is grossly normal. Tricuspid valve  regurgitation is trivial.   Aortic Valve: The aortic valve has an indeterminant number of cusps. There  is severe calcifcation of the aortic valve. Aortic valve regurgitation is  not visualized. Severe aortic stenosis is present. Aortic valve mean  gradient measures 65.7 mmHg. Aortic  valve  peak gradient measures 95.8 mmHg. Aortic valve area, by VTI measures  0.24 cm.   Pulmonic Valve: The pulmonic valve was grossly normal. Pulmonic valve  regurgitation is mild.   Aorta: The aortic root is normal in size and structure.   Venous: The inferior vena cava is normal in size with less than 50%  respiratory variability, suggesting right atrial pressure of 8 mmHg.   IAS/Shunts: No atrial level shunt detected by color flow Doppler.     LEFT VENTRICLE  PLAX 2D  LVIDd:     5.57 cm Diastology  LVIDs:     4.63 cm LV e' medial:  3.81 cm/s  LV PW:     1.24 cm LV E/e' medial: 29.7  LV IVS:    1.25 cm LV e' lateral:  4.95 cm/s  LVOT diam:   1.90 cm LV E/e' lateral: 22.8  LV SV:     28  LV SV Index:  12  LVOT Area:   2.84 cm     RIGHT VENTRICLE  RV S prime:   10.10 cm/s  TAPSE (M-mode): 2.4 cm   LEFT  ATRIUM       Index    RIGHT ATRIUM      Index  LA diam:    4.80 cm 2.04 cm/m RA Area:   14.60 cm  LA Vol (A2C):  85.4 ml 36.35 ml/m RA Volume:  40.90 ml 17.41 ml/m  LA Vol (A4C):  69.9 ml 29.75 ml/m  LA Biplane Vol: 83.4 ml 35.50 ml/m  AORTIC VALVE  AV Area (Vmax):  0.27 cm  AV Area (Vmean):  0.24 cm  AV Area (VTI):   0.24 cm  AV Vmax:      489.33 cm/s  AV Vmean:     386.667 cm/s  AV VTI:      1.163 m  AV Peak Grad:   95.8 mmHg  AV Mean Grad:   65.7 mmHg  LVOT Vmax:     46.97 cm/s  LVOT Vmean:    33.167 cm/s  LVOT VTI:     0.099 m  LVOT/AV VTI ratio: 0.09    AORTA  Ao Root diam: 3.10 cm   MITRAL VALVE  MV Area (PHT): 3.13 cm   SHUNTS  MV Decel Time: 242 msec   Systemic VTI: 0.10 m  MR Peak grad: 105.3 mmHg  Systemic Diam: 1.90 cm  MR Mean grad: 68.0 mmHg  MR Vmax:   513.00 cm/s  MR Vmean:   379.0 cm/s  MV E velocity: 113.00 cm/s  MV A velocity: 79.30 cm/s  MV E/A ratio: 1.42   _______________________  [facility] 09/18/20 RIGHT/LEFT HEART CATH AND CORONARY ANGIOGRAPHY  Conclusion    Prox RCA lesion is 40% stenosed.  Ost Cx to Prox Cx lesion is 40% stenosed.  Mid LAD lesion is 30% stenosed.   Findings:  On dobutamine 2.5   Ao = 103/66 (82)  LV = 214/35 RA = 13 RV = 63/15 PA = 79/31 (51) PCW = 32 (v = 43) Fick cardiac output/index = 6.3/2.7 PVR = 3.0 WU Ao sat = 96% PA sat = 66%, 67% PAPI = 3.7  AoV: Peak-to-peak gradient = 89 mm HG         Mean gradient = 60 mmHG         AVA = 0.71 cm2 Assessment:  1. Diffuse mild non-obstructive CAD 2. Critical calcific aortic stenosis 3. Markedly elevated filling pressure with normal cardiac output on dobutamine 4. 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 Continue diuresis. Continue TAVR w/u.   Glori Bickers, MD  10:00 AM   Patient Profile     Brett Williamson is a 80 y.o.  male with a hx of chronic diastolic CHF, severe AS (by echo in 06/2020), subdural hematoma in 06/2020 secondary to mechanical fall (s/p craniotomy hematoma evacuation), HTN and Type 2 DMwho was admitted to [facility] on [facility] with acute CHF and found to have newly reduced EF 35-40%, RV dysfunction and critical AS. He was transferred to [facility] for further cardiac work up and evaluation by the structural heart team.  Assessment & Plan    Critical AS: undergoing TAVR work up. 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 Will forgo pre TAVR PT eval given critical AS. Patient's creat is mildly elevated today but stable for CTs. Will plan for pre TAVR CT studies low contrast protocol. Tentative TAVR next Tuesday with Dr. Burt Knack and Dr. Roxy Manns. 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 Required dobutamine to augment cardiac output and allow diuresis in the setting of AKI. Given one dose of IV lasix after cath with brisk UOP. Felt to be euvolemic currently and diuretics on hold with slight bump in renal function and contrast with CT scans today.  AKI superimposed on CKD stage IIIB: creat peaked at 2.36. Fell to 1.44, but now back to 1.77 today. As above, holding all diuretics and low dose contrast CTs today.   Traumatic subdural hematoma: 11/21 with craniotomy/evacuation.  He is on SCDs for now for DVT prophylaxis.    Bilateral groin candidal infection: looks improved. Has been treated with oral fluconazole and nystatin powder.   SignedAngelena Form, PA-C  09/20/2020, 11:15 AM  Pager [phone]  Patient seen, examined. Available data reviewed. Agree with findings, assessment, and plan as outlined by Nell Range, PA-C.  Patient seen  yesterday evening independently.  His daughter was at the bedside.  The patient was resting comfortably without complaints.  He has progressed very well on low-dose dobutamine and is now on room air breathing comfortably with good oxygen saturations.  We have had multidisciplinary discussion of his case between the structural heart  and advanced heart failure teams.  We tentatively plan to proceed with TAVR on Tuesday pending final review of his CT angiography studies and formal cardiac surgical consultation.  Sherren Mocha, M.D. 09/21/2020 8:39 AM

## 2020-09-21 DIAGNOSIS — I350 Nonrheumatic aortic (valve) stenosis: Secondary | ICD-10-CM

## 2020-09-21 DIAGNOSIS — I5032 Chronic diastolic (congestive) heart failure: Secondary | ICD-10-CM

## 2020-09-21 LAB — GLUCOSE, CAPILLARY
Glucose-Capillary: 113 mg/dL — ABNORMAL HIGH (ref 70–99)
Glucose-Capillary: 112 mg/dL — ABNORMAL HIGH (ref 70–99)
Glucose-Capillary: 125 mg/dL — ABNORMAL HIGH (ref 70–99)
Glucose-Capillary: 102 mg/dL — ABNORMAL HIGH (ref 70–99)

## 2020-09-21 LAB — COMPREHENSIVE METABOLIC PANEL
ALT: 28 U/L (ref 0–44)
AST: 26 U/L (ref 15–41)
Albumin: 2.7 g/dL — ABNORMAL LOW (ref 3.5–5.0)
Alkaline Phosphatase: 59 U/L (ref 38–126)
Anion gap: 10 (ref 5–15)
BUN: 34 mg/dL — ABNORMAL HIGH (ref 8–23)
CO2: 26 mmol/L (ref 22–32)
Calcium: 8.5 mg/dL — ABNORMAL LOW (ref 8.9–10.3)
Chloride: 96 mmol/L — ABNORMAL LOW (ref 98–111)
Creatinine, Ser: 1.38 mg/dL — ABNORMAL HIGH (ref 0.61–1.24)
GFR, Estimated: 52 mL/min — ABNORMAL LOW (ref >60)
Glucose, Bld: 143 mg/dL — ABNORMAL HIGH (ref 70–99)
Potassium: 4 mmol/L (ref 3.5–5.1)
Sodium: 132 mmol/L — ABNORMAL LOW (ref 135–145)
Total Bilirubin: 1.4 mg/dL — ABNORMAL HIGH (ref 0.3–1.2)
Total Protein: 6.8 g/dL (ref 6.5–8.1)

## 2020-09-21 LAB — IRON AND TIBC
Iron: 52 ug/dL (ref 45–182)
Saturation Ratios: 17 % — ABNORMAL LOW (ref 17.9–39.5)
TIBC: 305 ug/dL (ref 250–450)
UIBC: 253 ug/dL

## 2020-09-21 LAB — COOXEMETRY PANEL
Carboxyhemoglobin: 1.4 % (ref 0.5–1.5)
Methemoglobin: 0.7 % (ref 0.0–1.5)
O2 Saturation: 74.2 %
Total hemoglobin: 13.4 g/dL (ref 12.0–16.0)

## 2020-09-21 NOTE — Plan of Care (Signed)

## 2020-09-21 NOTE — Consult Note (Addendum)
[facility] VALVE TEAM  Cardiology Consultation:   Patient ID: Brett Williamson MRN: [id number]; DOB: 11-Jul-1941  Admit date: 09/11/2020 Date of Consult: 09/21/2020  Primary Care Provider: Neale Burly, MD [facility] Cardiologist: Dr. Domenic Polite - new this admission  [facility] Electrophysiologist:  None    Patient Profile:   Brett Williamson is a 80 y.o. male with a hx of chronic diastolic CHF, severe AS (by echo in 06/2020), subdural hematoma in 06/2020 secondary to mechanical fall (s/p craniotomy hematoma evacuation), HTN, RBBB and DMT2 who was admitted to [facility] on 09/11/20 with acute CHF and found to have newly reduced EF 35-40%, RV dysfunction and critical AS. He was transferred to [facility] for further cardiac work up and evaluation by the structural heart team and advanced heart failure team. He has undergone TAVR work up and cardiothoracic surgery consultation is requested by Dr. Burt Knack.   History of Present Illness:   Brett Williamson lives independently in [place], [place]. He drove up until 06/2020 when he had a fall resulting in a subdural hematoma.  He does perform ADL's with the help of his daughter who helps around the house. He ambulates with the aid of the walker. He is not very active at baseline and the most exertion he gets is going out to eat with his girlfriend. He is edentulous and wears full dentures.   He has never been followed by cardiology. He does follow with a PCP who had previously told him he had a murmur but reportedly never ordered an echo.   He was admitted in 11/2021with a large right frontal subdural hematoma after a mechanical fall. He was taken to the OR and underwent uncomplicated right craniotomy. He was discharged to inpatient rehab and ultimately recovered. Of note, during this admission an echo was obtained which showed EF 55-60%, G2DD, normal RV function, mild LAE, severe MAC, mild MR, and severe AS with a mean gradient of  51.5 mm hg, peak gradient of 84.6 mm Hg, AVA 0.55 cm2, DVI 0.15, SVI 28 and mild dilation of the ascending aorta. Cardiology was not consulted during this admission and he was not referred in the outpatient setting.   He then presented to [facility] on 09/11/20 with a several week history of worsening dyspnea and LE edema. Notable labs included: creatinine 1.67(variable from 1.4 - 2.1 in 06/2020), lactic Acid 1.7 with repeat of 2.2, Hgb A1c 7.1, BNP 1413. Initial HS Troponin 211 with repeat values of 207, 231, 248 and 252. CXR consistent with CHF. EKG showed NSR, HR 92 with known RBBB. A repeat echocardiogram was obtained and showed his EF was now reduced at 35-40% with G2DD, severely reduced RV function, mild MR and severe/critical AS withmean gradient 65.7 mmHg, peak grad 95.8 mmHg, AVA 0.24 cm2, DVI 0.09, SVI 12. He was stated on IV diuresis which had to be deescalated due to worsening renal function with creat up to 1.85-->2.18. Given new biventricular dysfunction, acute CHF and critical AS, he was transferred to [facility] for cardiology work up and to be seen by structural heart and the advanced heart failure team.   Upon arrival to [facility], he was evaluation by Dr. Burt Knack who felt the patient had low output in the setting of critical AS and biventricular dysfunction with persistent hypotension and rising creatinine. He was started on dobutamine and Dr. Haroldine Laws with advanced CHF service took over his care. He was also noted to have bilateral candidal groin infections and started on  oral fluconazole and nystatin powder.   The patient was diuresed with IV lasix on dobuatmine. Lasix was held with a rise in creat to 2.36. 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 He was restarted on low dose IV lasix. Creat bumped a little after cath (1.77), but he was felt to be  stable enough to proceed with pre TAVR CTs with low contrast protocol. Cardiac gated CTA of the heart revealed anatomical characteristics consistent with aortic stenosis suitable for treatment by transcatheter aortic valve replacement without any significant complicating features aside from a borderline LCA hight of 10.3 mm and CTA of the aorta and iliac vessels demonstrate what appear to be adequate pelvic vascular access to facilitate a transfemoral approach. Patient has been adequately diuresed with a net output of ~20 L and 16 lb weight loss. He is felt to be euvolemic. Creat has improved to 1.38, even after CT scans.    Today the patient is seen in laying in bed. He is much more alert and feeling much better. He has no acute complaints. Denies chest pain. LE edema much improved. No orthopnea or PND. No dizziness or syncope.    Past Medical History:  Diagnosis Date  . Chronic combined systolic and diastolic CHF (congestive heart failure) ([place])   . Chronic kidney disease (CKD), stage III (moderate) ([facility])   . Diabetes ([place])   . Frequent falls   . HTN (hypertension)   . Morbid obesity ([employer])   . RBBB   . Severe aortic stenosis   . Subdural hematoma ([place]) 06/2020   after a mechanical fall    Past Surgical History:  Procedure Laterality Date  . CRANIOTOMY Right 06/19/2020   Procedure: CRANIOTOMY HEMATOMA EVACUATION SUBDURAL;  Surgeon: Erline Levine, MD;  Location: [facility];  Service: Neurosurgery;  Laterality: Right;  . RIGHT/LEFT HEART CATH AND CORONARY ANGIOGRAPHY N/A 09/18/2020   Procedure: RIGHT/LEFT HEART CATH AND CORONARY ANGIOGRAPHY;  Surgeon: Jolaine Artist, MD;  Location: [facility];  Service: Cardiovascular;  Laterality: N/A;  . ROTATOR CUFF REPAIR       Home Medications:  Prior to Admission medications   Medication Sig Start Date End Date Taking? Authorizing Provider  acetaminophen (TYLENOL) 325 MG tablet Take 650 mg by mouth every 6 (six) hours as needed.   Yes  [provider]  traMADol (ULTRAM) 50 MG tablet Take 50 mg by mouth 2 (two) times daily as needed. 07/19/20  Yes [provider]  triamcinolone (KENALOG) 0.1 % Apply 1 application topically See admin instructions. Apply to affected area 2 to 3 times a week 07/12/20  Yes [provider]    Inpatient Medications: Scheduled Meds: . aspirin EC  81 mg Oral Daily  . Chlorhexidine Gluconate Cloth  6 each Topical Daily  . fluconazole  100 mg Oral Daily  . insulin aspart  0-15 Units Subcutaneous TID WC  . insulin aspart  0-5 Units Subcutaneous QHS  . mouth rinse  15 mL Mouth Rinse BID  . nystatin   Topical TID  . sodium chloride flush  10-40 mL Intracatheter Q12H  . sodium chloride flush  3 mL Intravenous Q12H   Continuous Infusions: . sodium chloride 10 mL/hr at 09/15/20 0400  . sodium chloride    . DOBUTamine 2.5 mcg/kg/min (09/21/20 0400)   PRN Meds: sodium chloride, sodium chloride, acetaminophen,  Gerhardt's butt cream, guaiFENesin-dextromethorphan, ondansetron (ZOFRAN) IV, sodium chloride flush, sodium chloride flush  Allergies:   No Known Allergies  Social History:   Social History   Socioeconomic History  . Marital status: Widowed    Spouse name: Not on file  . Number of children: Not on file  . Years of education: Not on file  . Highest education level: Not on file  Occupational History  . Not on file  Tobacco Use  . Smoking status: Never Smoker  . Smokeless tobacco: Never Used  Vaping Use  . Vaping Use: Never used  Substance and Sexual Activity  . Alcohol use: Never  . Drug use: Never  . Sexual activity: Not Currently  Other Topics Concern  . Not on file  Social History Narrative  . Not on file   Social Determinants of Health   Financial Resource Strain: Not on file  Food Insecurity: Not on file  Transportation Needs: Not on file  Physical Activity: Not on file  Stress: Not on file  Social Connections: Not on file  Intimate Partner  Violence: Not on file    Family History:    Family History  Problem Relation Age of Onset  . Heart attack Father      ROS:  Please see the history of present illness.  All other ROS reviewed and negative.     Physical Exam/Data:   Vitals:   09/20/20 1932 09/20/20 2346 09/21/20 0244 09/21/20 0511  BP: 118/65 131/78 116/76   Pulse: 82 87 75   Resp: _0 Temp: 98.3 F (36.8 C) 97.7 F (36.5 C) 98.7 F (37.1 C)   TempSrc: Oral Oral Oral   SpO2: 93% 97% 94%   Weight:    110.2 kg  Height:        Intake/Output Summary (Last 24 hours) at 09/21/2020 0542 Last data filed at 09/21/2020 0400 Gross per 24 hour  Intake 626.51 ml  Output 2200 ml  Net -1573.49 ml   Last 3 Weights 09/21/2020 09/20/2020 09/19/2020  Weight (lbs) 242 lb 15.2 oz 249 lb 9 oz 251 lb 15.8 oz  Weight (kg) 110.2 kg 113.2 kg 114.3 kg     Body mass index is 33.88 kg/m.  General:  Well nourished, well developed, in no acute distress HEENT: normal Lymph: no adenopathy Neck: no JVD Endocrine:  No thryomegaly Vascular: No carotid bruits; FA pulses 2+ bilaterally without bruits  Cardiac:  normal S1, S2; RRR; 3/6 harsh SEM  Lungs:  clear to auscultation bilaterally, no wheezing, rhonchi or rales  Abd: soft, nontender, no hepatomegaly  Ext: trace pretibial edema. Legs wrapped in unna boots.  Musculoskeletal:  No deformities, BUE and BLE strength normal and equal Skin: warm and dry. essentially resolved bilateral groin yeast infections. Some remaining hyperpigmentation Neuro:  CNs 2-12 intact, no focal abnormalities noted Psych:  Normal affect   EKG:  The EKG was personally reviewed and demonstrates:  2/4: sinus with 1st deg AV block and RBBB Telemetry:  Telemetry was personally reviewed and demonstrates:  Sinus with PVCs  Relevant CV Studies: Echo 09/12/20 MPRESSIONS  1. Left ventricular ejection fraction, by estimation, is 35 to 40%. The  left ventricle has moderately decreased function. The left  ventricle  demonstrates regional wall motion abnormalities (see scoring  diagram/findings for description). There is mild  left ventricular hypertrophy. Left ventricular diastolic parameters are  consistent with Grade II diastolic dysfunction (pseudonormalization).  2. Right ventricular systolic function is severely reduced. The right  ventricular size is normal. Tricuspid regurgitation signal is inadequate  for assessing PA pressure.  3. The mitral valve is abnormal. Mild mitral valve regurgitation.  Moderate mitral annular calcification.  4. The aortic valve has an indeterminant number of cusps. There is severe  calcifcation of the aortic valve. Aortic valve regurgitation is not  visualized. Severe-critical aortic valve stenosis. Aortic valve mean  gradient measures 65.7 mmHg. Aortic valve  Vmax measures 4.89 m/s.  5. The inferior vena cava is normal in size with <50% respiratory  variability, suggesting right atrial pressure of 8 mmHg.   FINDINGS  Left Ventricle: Left ventricular ejection fraction, by estimation, is 35  to 40%. The left ventricle has moderately decreased function. The left  ventricle demonstrates regional wall motion abnormalities. The left  ventricular internal cavity size was  normal in size. There is mild left ventricular hypertrophy. Left  ventricular diastolic parameters are consistent with Grade II diastolic  dysfunction (pseudonormalization).     LV Wall Scoring:  The anterior wall, entire lateral wall, and basal inferior segment are  akinetic. The mid and distal anterior septum, apical anterior segment,  basal  inferoseptal segment, and apex are hypokinetic. The mid and distal  inferior  wall, basal anteroseptal segment, and mid inferoseptal segment are normal.   Right Ventricle: The right ventricular size is normal. No increase in  right ventricular wall thickness. Right ventricular systolic function is  severely reduced. Tricuspid  regurgitation signal is inadequate for  assessing PA pressure.   Left Atrium: Left atrial size was normal in size.   Right Atrium: Right atrial size was normal in size.   Pericardium: There is no evidence of pericardial effusion.   Mitral Valve: The mitral valve is abnormal. There is mild thickening of  the mitral valve leaflet(s). There is mild calcification of the mitral  valve leaflet(s). Moderate mitral annular calcification. Mild mitral valve  regurgitation.   Tricuspid Valve: The tricuspid valve is grossly normal. Tricuspid valve  regurgitation is trivial.   Aortic Valve: The aortic valve has an indeterminant number of cusps. There  is severe calcifcation of the aortic valve. Aortic valve regurgitation is  not visualized. Severe aortic stenosis is present. Aortic valve mean  gradient measures 65.7 mmHg. Aortic  valve peak gradient measures 95.8 mmHg. Aortic valve area, by VTI measures  0.24 cm.   Pulmonic Valve: The pulmonic valve was grossly normal. Pulmonic valve  regurgitation is mild.   Aorta: The aortic root is normal in size and structure.   Venous: The inferior vena cava is normal in size with less than 50%  respiratory variability, suggesting right atrial pressure of 8 mmHg.   IAS/Shunts: No atrial level shunt detected by color flow Doppler.     LEFT VENTRICLE  PLAX 2D  LVIDd:     5.57 cm Diastology  LVIDs:     4.63 cm LV e' medial:  3.81 cm/s  LV PW:     1.24 cm LV E/e' medial: 29.7  LV IVS:    1.25 cm LV e' lateral:  4.95 cm/s  LVOT diam:   1.90 cm LV E/e' lateral: 22.8  LV SV:     28  LV SV Index:  12  LVOT Area:   2.84 cm     RIGHT VENTRICLE  RV S prime:   10.10 cm/s  TAPSE (M-mode): 2.4 cm   LEFT ATRIUM       Index    RIGHT ATRIUM      Index  LA  diam:    4.80 cm 2.04 cm/m RA Area:   14.60 cm  LA Vol (A2C):  85.4 ml 36.35 ml/m RA Volume:  40.90 ml 17.41 ml/m  LA Vol (A4C):   69.9 ml 29.75 ml/m  LA Biplane Vol: 83.4 ml 35.50 ml/m  AORTIC VALVE  AV Area (Vmax):  0.27 cm  AV Area (Vmean):  0.24 cm  AV Area (VTI):   0.24 cm  AV Vmax:      489.33 cm/s  AV Vmean:     386.667 cm/s  AV VTI:      1.163 m  AV Peak Grad:   95.8 mmHg  AV Mean Grad:   65.7 mmHg  LVOT Vmax:     46.97 cm/s  LVOT Vmean:    33.167 cm/s  LVOT VTI:     0.099 m  LVOT/AV VTI ratio: 0.09    AORTA  Ao Root diam: 3.10 cm   MITRAL VALVE  MV Area (PHT): 3.13 cm   SHUNTS  MV Decel Time: 242 msec   Systemic VTI: 0.10 m  MR Peak grad: 105.3 mmHg  Systemic Diam: 1.90 cm  MR Mean grad: 68.0 mmHg  MR Vmax:   513.00 cm/s  MR Vmean:   379.0 cm/s  MV E velocity: 113.00 cm/s  MV A velocity: 79.30 cm/s  MV E/A ratio: 1.42   Rozann Lesches MD  Electronically signed by Rozann Lesches MD  Signature Date/Time: 09/12/2020/2:29:55 PM   ________________________________  [facility] 09/18/20 RIGHT/LEFT HEART CATH AND CORONARY ANGIOGRAPHY  Conclusion    Prox RCA lesion is 40% stenosed.  Ost Cx to Prox Cx lesion is 40% stenosed.  Mid LAD lesion is 30% stenosed.   Findings:  On dobutamine 2.5   Ao = 103/66 (82)  LV = 214/35 RA = 13 RV = 63/15 PA = 79/31 (51) PCW = 32 (v = 43) Fick cardiac output/index = 6.3/2.7 PVR = 3.0 WU Ao sat = 96% PA sat = 66%, 67% PAPI = 3.7  AoV: Peak-to-peak gradient = 89 mm HG         Mean gradient = 60 mmHG         AVA = 0.71 cm2 Assessment:  1. Diffuse mild non-obstructive CAD 2. Critical calcific aortic stenosis 3. Markedly elevated filling pressure with normal cardiac output on dobutamine 4. 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 Continue diuresis. Continue TAVR w/u.   Glori Bickers, MD  10:00 AM  _____________________________   Cardiac CT 09/20/20 IMPRESSION: 1. Tricuspid aortic valve with severe aortic stenosis (calcium  score 6553).  2. Annular measurements appropriate for 29 mm Sapien 3 TAVR (610 mm2).  3. No significant annular or subannular calcifications.  4. Borderline LCA height, 10.3 mm.  Sufficient RCA height, 14.8 mm.  5. Optimal Fluoroscopic Angle for Delivery: LAO 4 CRA 2  6. Dilated pulmonary artery suggestive of pulmonary hypertension.  7. Moderately reduced LV function, EF 36%.  Global hypokinesis.   _________________________  CT angio chest/abd/pelvis 09/20/20 VASCULAR MEASUREMENTS PERTINENT TO TAVR:  AORTA:  Minimal Aortic Diameter-19.2 x 16.9 mm  Severity of Aortic Calcification-moderate  RIGHT PELVIS:  Right Common Iliac Artery -  Minimal Diameter-13.1 x 11.7 mm  Tortuosity-mild  Calcification-mild  Right External Iliac Artery -  Minimal Diameter-10.0 x 9.6 mm  Tortuosity-severe  Calcification-none  Right Common Femoral Artery -  Minimal Diameter-9.5 x 9.0 mm  Tortuosity-mild  Calcification-mild  LEFT PELVIS:  Left Common Iliac Artery -  Minimal Diameter-12.6 x  11.7 mm  Tortuosity-moderate  Calcification-mild  Left External Iliac Artery -  Minimal Diameter-9.2 x 8.9 mm  Tortuosity-mild  Calcification-none  Left Common Femoral Artery -  Minimal Diameter-9.2 x 9.0 mm  Tortuosity-mild  Calcification-none  Review of the MIP images confirms the above findings.  IMPRESSION: 1. Vascular findings and measurements pertinent to potential TAVR procedure, as detailed. 2. Diffusely thickening and coarsely calcified aortic valve, compatible with reported history of severe aortic stenosis. 3. Mild cardiomegaly. Three-vessel coronary atherosclerosis. 4. Dilated main pulmonary artery, suggesting pulmonary arterial hypertension. 5. 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 Recommend annual imaging followup by CTA or MRA. This recommendation follows 2010 ACCF/AHA/AATS/ACR/ASA/SCA/SCAI/SIR/STS/SVM Guidelines  for the Diagnosis and Management of Patients with Thoracic Aortic Disease. Circulation. 2010; 121: D638-V564. Aortic aneurysm NOS (ICD10-I71.9). 6. Patchy mild hazy ground-glass opacity in both lungs with faint interlobular septal thickening, favor mild pulmonary edema. Small dependent bilateral pleural effusions, right greater than left. 7. Exophytic heterogeneous hyperdense 1.7 cm renal cortical mass in anterior lower left kidney, renal cell carcinoma not excluded. MRI or CT abdomen without and with IV contrast is indicated for further evaluation. 8. Suggestion of a finely irregular liver surface, cannot exclude cirrhosis. No liver masses. Consider hepatic elastography for further liver fibrosis risk stratification, as clinically warranted. 9. Mild splenomegaly. Small paraumbilical varix. No ascites. 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 11. Cholelithiasis. 12. Aortic Atherosclerosis (ICD10-I70.0).    Laboratory Data:  High Sensitivity Troponin:   Recent Labs  Lab 09/11/20 1750 09/11/20 1759 09/12/20 0027 09/12/20 0708 09/12/20 1214  TROPONINIHS 211* 207* 231* 248* 252*     Chemistry Recent Labs  Lab 09/19/20 0429 09/20/20 0645 09/21/20 0430  NA 133* 132* 132*  K 4.4 4.1 4.0  CL 96* 94* 96*  CO2 _0 GLUCOSE 112* 126* 143*  BUN 42* 41* 34*  CREATININE 1.55* 1.71* 1.38*  CALCIUM 8.6* 8.6* 8.5*  GFRNONAA 45* 40* 52*  ANIONGAP _1 Recent Labs  Lab 09/19/20 0429 09/20/20 0645 09/21/20 0430  PROT 7.1 7.3 6.8  ALBUMIN 2.9* 2.9* 2.7*  AST _2 ALT _3 ALKPHOS 65 71 59  BILITOT 2.0* 1.7* 1.4*   Hematology Recent Labs  Lab 09/18/20 0500 09/18/20 0920 09/18/20 0923 09/19/20 0429 09/20/20 0645  WBC 8.2  --   --  8.4 9.0  RBC 5.15  --   --  5.28 5.70  HGB 12.3*   < > 13.9  13.3 13.1 13.7  HCT 40.6   < > 41.0  39.0 41.1 44.9  MCV 78.8*  --   --  77.8* 78.8*  MCH 23.9*  --   --  24.8* 24.0*  MCHC 30.3  --   --  31.9 30.5  RDW 14.6  --    --  14.8 15.1  PLT 181  --   --  197 219   < > = values in this interval not displayed.   BNPNo results for input(s): BNP, PROBNP in the last 168 hours.  DDimer No results for input(s): DDIMER in the last 168 hours.   Radiology/Studies:  CARDIAC CATHETERIZATION  Result Date: 09/18/2020  Prox RCA lesion is 40% stenosed.  Ost Cx to Prox Cx lesion is 40% stenosed.  Mid LAD lesion is 30% stenosed.  Findings: On dobutamine 2.5 Ao = 103/66 (82) LV = 214/35 RA = 13 RV = 63/15 PA = 79/31 (51) PCW = 32 (v = 43) Fick cardiac output/index = 6.3/2.7  PVR = 3.0 WU Ao sat = 96% PA sat = 66%, 67% PAPI = 3.7 AoV: Peak-to-peak gradient = 89 mm HG         Mean gradient = 60 mmHG         AVA = 0.71 cm2 Assessment: 1. Diffuse mild non-obstructive CAD 2. Critical calcific aortic stenosis 3. Markedly elevated filling pressure with normal cardiac output on dobutamine 4. 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 Continue diuresis. Continue TAVR w/u. Glori Bickers, MD 10:00 AM   CT CORONARY MORPH W/CTA COR W/SCORE Lewanda Rife W/CM &/OR WO/CM  Addendum Date: 09/20/2020   ADDENDUM REPORT: 09/20/2020 21:22 CLINICAL DATA:  Severe Aortic Stenosis. EXAM: Cardiac TAVR CT TECHNIQUE: The patient was scanned on a [employer]. A 140 kV retrospective scan was triggered in the descending thoracic aorta at 111 HU's. Gantry rotation speed was 250 msecs and collimation was .6 mm. No beta blockade or nitro were given. The 3D data set was reconstructed in 5% intervals of the R-R cycle. Systolic and diastolic phases were analyzed on a dedicated work station using MPR, MIP and VRT modes. The patient received 80 cc of contrast. FINDINGS: Image quality: Average. Noise artifact is: Mild. Valve Morphology: Tricuspid aortic valve with severely calcified leaflets with severely restricted movement in systole. The leaflets are all diffusely calcified. Aortic Valve Calcium score: 6553 Aortic annular dimension:  Phase assessed: 30% Annular area: 610 mm2 Annular perimeter: 91.7 mm Max diameter: 34.2 mm Min diameter: 23.8 mm Annular and subannular calcification: None. Optimal coplanar projection: LAO 4 CRA 2 Coronary Artery Height above Annulus: Left Main: 10.3 mm Right Coronary: 14.8 mm Sinus of Valsalva Measurements: Non-coronary: 21.3 mm Right-coronary: 22.6 mm Left-coronary: 19.3 mm Sinus of Valsalva Height: Non-coronary: 38 mm Right-coronary: 35 mm Left-coronary: 38 mm Sinotubular Junction: 37 mm Ascending Thoracic Aorta: 38 mm (measured using double oblique technique). Coronary Arteries: Normal coronary origin. Right dominance. The study was performed without use of NTG and is insufficient for plaque evaluation. Please refer to recent cardiac catheterization for coronary assessment. 3-vessel coronary calcifications noted. Cardiac Morphology: Right Atrium: Right atrial size is within normal limits. Right Ventricle: The right ventricular cavity is dilated. Left Atrium: Left atrial size is normal in size with no left atrial appendage filling defect. Left Ventricle: The ventricular cavity size is within normal limits. There are no stigmata of prior infarction. There is no abnormal filling defect. Left ventricular function is moderately reduced, 36%. There is global hypokinesis. Pulmonary arteries: Dilated suggestive of pulmonary hypertension. Pulmonary veins: Normal pulmonary venous drainage. Pericardium: Normal thickness with no significant effusion or calcium present. Mitral Valve: The mitral valve is normal structure with moderate mitral annular calcification. Extra-cardiac findings: See attached radiology report for non-cardiac structures. IMPRESSION: 1. 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 2. Annular measurements appropriate for 29 mm Sapien 3 TAVR (610 mm2). 3. No significant annular or subannular calcifications. 4. Borderline LCA height, 10.3 mm.  Sufficient RCA height, 14.8 mm. 5.  Optimal Fluoroscopic Angle for Delivery: LAO 4 CRA 2 6. Dilated pulmonary artery suggestive of pulmonary hypertension. 7. Moderately reduced LV function, EF 36%.  Global hypokinesis. [place] T. Audie Box, MD Electronically Signed   By: Eleonore Chiquito   On: 09/20/2020 21:22   Result Date: 09/20/2020 EXAM: OVER-READ INTERPRETATION  CT CHEST The following report is an over-read performed by radiologist Dr. Salvatore Marvel of [facility], [place] on 09/20/2020. This over-read does not include interpretation of cardiac or coronary  anatomy or pathology. The coronary CTA interpretation by the cardiologist is attached. COMPARISON:  10/05/2020 chest radiograph. FINDINGS: Please see the separate concurrent chest CT angiogram report for details. IMPRESSION: Please see the separate concurrent chest CT angiogram report for details. Electronically Signed: By: Ilona Sorrel M.D. On: 09/20/2020 13:59   CT ANGIO CHEST AORTA W/CM & OR WO/CM  Result Date: 09/20/2020 CLINICAL DATA:  Inpatient. Severe aortic stenosis. Pre-TAVR planning. EXAM: CT ANGIOGRAPHY CHEST, ABDOMEN AND PELVIS TECHNIQUE: Multidetector CT imaging through the chest, abdomen and pelvis was performed using the standard protocol during bolus administration of intravenous contrast. Multiplanar reconstructed images and MIPs were obtained and reviewed to evaluate the vascular anatomy. CONTRAST:  85m OMNIPAQUE IOHEXOL 350 MG/ML SOLN COMPARISON:  09/15/2020 chest radiograph. FINDINGS: CTA CHEST FINDINGS Cardiovascular: Mild cardiomegaly. No significant pericardial effusion/thickening. Diffusely thickened and coarsely calcified aortic valve. Three-vessel coronary atherosclerosis. Left PICC terminates at the cavoatrial junction. 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 Dilated main pulmonary artery (4.0 cm diameter). No central pulmonary emboli. Mediastinum/Nodes: Coarse calcified 1.6 cm right thyroid nodule. Unremarkable esophagus. No  pathologically enlarged axillary, mediastinal or hilar lymph nodes. Lungs/Pleura: No pneumothorax. Small dependent bilateral pleural effusions, right greater than left. Patchy mild hazy ground-glass opacity in both lungs with mild mosaic attenuation and faint interlobular septal thickening. Moderate passive atelectasis in the dependent lower lobes bilaterally. No lung masses or significant pulmonary nodules in the aerated portions of the lungs. Musculoskeletal: No aggressive appearing focal osseous lesions. Soft tissue anchors in the left humeral head. Marked thoracic spondylosis. Hypodense superficial subcutaneous 1.8 cm lesion in the medial ventral left chest wall (series 4/image 53), nonspecific, potentially a sebaceous cyst. CTA ABDOMEN AND PELVIS FINDINGS Hepatobiliary: Suggestion of a finely irregular liver surface, cannot exclude cirrhosis. No liver masses. Cholelithiasis. No gallbladder wall thickening. No biliary ductal dilatation. Pancreas: Normal, with no mass or duct dilation. Spleen: Mild splenomegaly. Craniocaudal splenic length 15.5 cm. No splenic masses. Adrenals/Urinary Tract: Normal right adrenal. Left adrenal 2.9 cm nodule with density -5 HU, compatible with a benign adenoma. Heterogeneous hyperdense 1.7 cm exophytic renal cortical mass in the anterior lower left kidney (series 4/image 140). Simple 1.7 cm posterior lower right renal cyst. Scattered moderate renal cortical scarring throughout the left kidney. No hydronephrosis. Bladder collapsed by indwelling Foley catheter, limiting evaluation. Stomach/Bowel: Normal non-distended stomach. Normal caliber small bowel with no small bowel wall thickening. Appendix not discretely visualized. Normal large bowel with no diverticulosis, large bowel wall thickening or pericolonic fat stranding. Vascular/Lymphatic: Atherosclerotic nonaneurysmal abdominal aorta. Small paraumbilical varix. No pathologically enlarged lymph nodes in the abdomen or pelvis.  Reproductive: Top-normal size prostate. Other: No pneumoperitoneum, ascites or focal fluid collection. Musculoskeletal: No aggressive appearing focal osseous lesions. Marked lumbar spondylosis. VASCULAR MEASUREMENTS PERTINENT TO TAVR: AORTA: Minimal Aortic Diameter-19.2 x 16.9 mm Severity of Aortic Calcification-moderate RIGHT PELVIS: Right Common Iliac Artery - Minimal Diameter-13.1 x 11.7 mm Tortuosity-mild Calcification-mild Right External Iliac Artery - Minimal Diameter-10.0 x 9.6 mm Tortuosity-severe Calcification-none Right Common Femoral Artery - Minimal Diameter-9.5 x 9.0 mm Tortuosity-mild Calcification-mild LEFT PELVIS: Left Common Iliac Artery - Minimal Diameter-12.6 x 11.7 mm Tortuosity-moderate Calcification-mild Left External Iliac Artery - Minimal Diameter-9.2 x 8.9 mm Tortuosity-mild Calcification-none Left Common Femoral Artery - Minimal Diameter-9.2 x 9.0 mm Tortuosity-mild Calcification-none Review of the MIP images confirms the above findings. IMPRESSION: 1. Vascular findings and measurements pertinent to potential TAVR procedure, as detailed. 2. Diffusely thickening and coarsely calcified aortic valve, compatible with reported history of  severe aortic stenosis. 3. Mild cardiomegaly. Three-vessel coronary atherosclerosis. 4. Dilated main pulmonary artery, suggesting pulmonary arterial hypertension. 5. 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 Recommend annual imaging followup by CTA or MRA. This recommendation follows 2010 ACCF/AHA/AATS/ACR/ASA/SCA/SCAI/SIR/STS/SVM Guidelines for the Diagnosis and Management of Patients with Thoracic Aortic Disease. Circulation. 2010; 121: P824-M353. Aortic aneurysm NOS (ICD10-I71.9). 6. Patchy mild hazy ground-glass opacity in both lungs with faint interlobular septal thickening, favor mild pulmonary edema. Small dependent bilateral pleural effusions, right greater than left. 7. Exophytic heterogeneous hyperdense 1.7 cm renal cortical mass in anterior lower left  kidney, renal cell carcinoma not excluded. MRI or CT abdomen without and with IV contrast is indicated for further evaluation. 8. Suggestion of a finely irregular liver surface, cannot exclude cirrhosis. No liver masses. Consider hepatic elastography for further liver fibrosis risk stratification, as clinically warranted. 9. Mild splenomegaly. Small paraumbilical varix. No ascites. 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 11. Cholelithiasis. 12. Aortic Atherosclerosis (ICD10-I70.0). Electronically Signed   By: Ilona Sorrel M.D.   On: 09/20/2020 14:38   CT Angio Abd/Pel w/ and/or w/o  Result Date: 09/20/2020 CLINICAL DATA:  Inpatient. Severe aortic stenosis. Pre-TAVR planning. EXAM: CT ANGIOGRAPHY CHEST, ABDOMEN AND PELVIS TECHNIQUE: Multidetector CT imaging through the chest, abdomen and pelvis was performed using the standard protocol during bolus administration of intravenous contrast. Multiplanar reconstructed images and MIPs were obtained and reviewed to evaluate the vascular anatomy. CONTRAST:  45m OMNIPAQUE IOHEXOL 350 MG/ML SOLN COMPARISON:  09/15/2020 chest radiograph. FINDINGS: CTA CHEST FINDINGS Cardiovascular: Mild cardiomegaly. No significant pericardial effusion/thickening. Diffusely thickened and coarsely calcified aortic valve. Three-vessel coronary atherosclerosis. Left PICC terminates at the cavoatrial junction. 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 Dilated main pulmonary artery (4.0 cm diameter). No central pulmonary emboli. Mediastinum/Nodes: Coarse calcified 1.6 cm right thyroid nodule. Unremarkable esophagus. No pathologically enlarged axillary, mediastinal or hilar lymph nodes. Lungs/Pleura: No pneumothorax. Small dependent bilateral pleural effusions, right greater than left. Patchy mild hazy ground-glass opacity in both lungs with mild mosaic attenuation and faint interlobular septal thickening. Moderate passive atelectasis in the dependent lower lobes  bilaterally. No lung masses or significant pulmonary nodules in the aerated portions of the lungs. Musculoskeletal: No aggressive appearing focal osseous lesions. Soft tissue anchors in the left humeral head. Marked thoracic spondylosis. Hypodense superficial subcutaneous 1.8 cm lesion in the medial ventral left chest wall (series 4/image 53), nonspecific, potentially a sebaceous cyst. CTA ABDOMEN AND PELVIS FINDINGS Hepatobiliary: Suggestion of a finely irregular liver surface, cannot exclude cirrhosis. No liver masses. Cholelithiasis. No gallbladder wall thickening. No biliary ductal dilatation. Pancreas: Normal, with no mass or duct dilation. Spleen: Mild splenomegaly. Craniocaudal splenic length 15.5 cm. No splenic masses. Adrenals/Urinary Tract: Normal right adrenal. Left adrenal 2.9 cm nodule with density -5 HU, compatible with a benign adenoma. Heterogeneous hyperdense 1.7 cm exophytic renal cortical mass in the anterior lower left kidney (series 4/image 140). Simple 1.7 cm posterior lower right renal cyst. Scattered moderate renal cortical scarring throughout the left kidney. No hydronephrosis. Bladder collapsed by indwelling Foley catheter, limiting evaluation. Stomach/Bowel: Normal non-distended stomach. Normal caliber small bowel with no small bowel wall thickening. Appendix not discretely visualized. Normal large bowel with no diverticulosis, large bowel wall thickening or pericolonic fat stranding. Vascular/Lymphatic: Atherosclerotic nonaneurysmal abdominal aorta. Small paraumbilical varix. No pathologically enlarged lymph nodes in the abdomen or pelvis. Reproductive: Top-normal size prostate. Other: No pneumoperitoneum, ascites or focal fluid collection. Musculoskeletal: No aggressive appearing focal osseous lesions. Marked lumbar spondylosis. VASCULAR MEASUREMENTS PERTINENT TO  TAVR: AORTA: Minimal Aortic Diameter-19.2 x 16.9 mm Severity of Aortic Calcification-moderate RIGHT PELVIS: Right Common Iliac  Artery - Minimal Diameter-13.1 x 11.7 mm Tortuosity-mild Calcification-mild Right External Iliac Artery - Minimal Diameter-10.0 x 9.6 mm Tortuosity-severe Calcification-none Right Common Femoral Artery - Minimal Diameter-9.5 x 9.0 mm Tortuosity-mild Calcification-mild LEFT PELVIS: Left Common Iliac Artery - Minimal Diameter-12.6 x 11.7 mm Tortuosity-moderate Calcification-mild Left External Iliac Artery - Minimal Diameter-9.2 x 8.9 mm Tortuosity-mild Calcification-none Left Common Femoral Artery - Minimal Diameter-9.2 x 9.0 mm Tortuosity-mild Calcification-none Review of the MIP images confirms the above findings. IMPRESSION: 1. Vascular findings and measurements pertinent to potential TAVR procedure, as detailed. 2. Diffusely thickening and coarsely calcified aortic valve, compatible with reported history of severe aortic stenosis. 3. Mild cardiomegaly. Three-vessel coronary atherosclerosis. 4. Dilated main pulmonary artery, suggesting pulmonary arterial hypertension. 5. 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 Recommend annual imaging followup by CTA or MRA. This recommendation follows 2010 ACCF/AHA/AATS/ACR/ASA/SCA/SCAI/SIR/STS/SVM Guidelines for the Diagnosis and Management of Patients with Thoracic Aortic Disease. Circulation. 2010; 121: U015-I153. Aortic aneurysm NOS (ICD10-I71.9). 6. Patchy mild hazy ground-glass opacity in both lungs with faint interlobular septal thickening, favor mild pulmonary edema. Small dependent bilateral pleural effusions, right greater than left. 7. Exophytic heterogeneous hyperdense 1.7 cm renal cortical mass in anterior lower left kidney, renal cell carcinoma not excluded. MRI or CT abdomen without and with IV contrast is indicated for further evaluation. 8. Suggestion of a finely irregular liver surface, cannot exclude cirrhosis. No liver masses. Consider hepatic elastography for further liver fibrosis risk stratification, as clinically warranted. 9. Mild splenomegaly. Small  paraumbilical varix. No ascites. 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 11. Cholelithiasis. 12. Aortic Atherosclerosis (ICD10-I70.0). Electronically Signed   By: Ilona Sorrel M.D.   On: 09/20/2020 14:38    STS Risk Calculator: Procedure: Isolated AVR  Risk of Mortality:  3.569%  Renal Failure:  6.783%  Permanent Stroke:  1.569%  Prolonged Ventilation:  15.834%  DSW Infection:  0.169%  Reoperation:  5.399%  Morbidity or Mortality:  24.385%  Short Length of Stay:  22.472%  Long Length of Stay:  9.486%   _______________________  [facility] Cardiomyopathy Questionnaire  KCCQ-12 09/13/2020  1 a. Ability to shower/bathe Extremely limited  1 b. Ability to walk 1 block Other, Did not do  1 c. Ability to hurry/jog Other, Did not do  2. Edema feet/ankles/legs Every morning  3. Limited by fatigue All of the time  4. Limited by dyspnea All of the time  5. Sitting up / on 3+ pillows Every night  6. Limited enjoyment of life Extremely limited  7. Rest of life w/ symptoms Not at all satisfied  8 a. Participation in hobbies Severely limited  8 b. Participation in chores Severely limited  8 c. Visiting family/friends Severely limited      Assessment and Plan:   Brett Williamson is a 80 y.o. male with symptoms of severe, stage D1 aortic stenosis with NYHA Class IV symptoms currently admitted for low output biventricular heart failure. I have reviewed the patient's recent echocardiogram which is notable for reduced LV systolic function (EF 79-43%), severe RV dysfunction and critical aortic stenosis with peak gradient of 95.8 mm hg and mean transvalvular gradient of 65.7. The patient's dimensionless index is 0.09 and calculated aortic valve area is 0.24 cm.   I have reviewed the natural history of aortic stenosis with the patient. We have discussed the limitations of medical therapy and the poor prognosis associated with symptomatic aortic stenosis. We have reviewed  potential treatment options, including  palliative medical therapy, conventional surgical aortic valve replacement, and transcatheter aortic valve replacement. We discussed treatment options in the context of this patient's specific comorbid medical conditions.   The patient's predicted risk of mortality with conventional aortic valve replacement is 3.569% primarily based on age, CHF with reduced EF, HTN, DMT2, obesity, CKD stage III. Other significant comorbid conditions include RV dysfunction and physical deconditioing. TAVR seems like a reasonable treatment option. I do not think he would be considered for conventional SAVR under any circumstances. Fortunately, cardiac gated CTA of the heart reveals anatomical characteristics consistent with aortic stenosis suitable for treatment by transcatheter aortic valve replacement without any significant complicating features aside from a borderline LCA hight of 10.3 mm and CTA of the aorta and iliac vessels demonstrate what appear to be adequate pelvic vascular access to facilitate a transfemoral approach. There were several incidental findings including ectatic ascending thoracic aorta and a exophytic heterogeneous hyperdense 1.7 cm renal cortical mass inanterior lower left kidney (renal cell carcinoma not excluded) as well as suggestion of a finely irregular liver surface, cannot exclude cirrhosis. An abdominal MRI with and without gadolinium has been ordered.   Given critical AS, we have forgone PT assessment and we will keep inpatient for tentative inpatient TAVR, planned for next Tuesday. 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 Lastly, the patient presented with bilateral candidal groin infections. These seem to be adequately treated with oral fluconazole and nystatin powder.   Dr. Roxy Manns to follow.     For questions or updates, please contact [place] Please  consult [email or web address] for contact info under    Signed, Angelena Form, PA-C  09/21/2020 5:42 AM    I have seen and examined the patient, reviewed all of his labs and diagnostic tests, and I agree with the assessment and plan as outlined above by Angelena Form, PA-C  Patient is an 81 year old gentleman with aortic stenosis, hypertension, type 2 diabetes mellitus, and chronic right bundle branch block who recently has been recovering from hospitalization following closed head trauma requiring craniotomy for evacuation of hematoma in November 2021 and now was admitted with 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  He has improved remarkably well with aggressive medical management including inotropic support and intravenous diuresis.  I have personally reviewed the patient's transthoracic echocardiogram, diagnostic cardiac catheterization, and CT angiograms.  Echocardiogram reveals critical aortic stenosis with severe calcification, thickening, and restricted leaflet mobility involving all 3 leaflets of the aortic valve.  Peak velocity across aortic valve measured 4.9 m/s corresponding to mean transvalvular gradient estimated 65.7 mmHg despite the presence of significant acutely decompensated left ventricular function.  Ejection fraction was notably only 35 to 40%, down from normal in November 2021.  Diagnostic cardiac catheterization confirmed presence of critical aortic stenosis with mean transvalvular gradient measured 60 mmHg by catheterization.  Catheterization also revealed mild nonobstructive coronary artery disease with severe pulmonary hypertension.  Cardiac-gated CTA of the heart reveals anatomical characteristics consistent with aortic stenosis suitable for treatment by  transcatheter aortic valve replacement without any significant complicating features and CTA of the aorta and iliac vessels demonstrate what appears to be adequate pelvic vascular access  to facilitate a transfemoral approach.  The patient also has moderate size right pleural effusion and small left pleural effusion.  I agree the patient would best be treated with transcatheter aortic valve replacement.  The patient was counseled at length regarding treatment alternatives for management of severe symptomatic aortic stenosis. Alternative approaches such as conventional aortic valve replacement, transcatheter aortic valve replacement, and continued medical therapy without intervention were compared and contrasted at length.  The risks associated with conventional surgical aortic valve replacement were discussed in detail, as were expectations for post-operative convalescence, and why I would be reluctant to consider this patient a candidate for conventional surgery.  Issues specific to transcatheter aortic valve replacement were discussed including questions about long term valve durability, the potential for paravalvular leak, possible increased risk of need for permanent pacemaker placement, and other technical complications related to the procedure itself.  Long-term prognosis with medical therapy was discussed. This discussion was placed in the context of the patient's own specific clinical presentation and past medical history.  All of his questions have been addressed.  The patient desires to proceed with transcatheter aortic valve replacement during this hospitalization, tentatively scheduled for Tuesday, September 25, 2020.  Following the decision to proceed with transcatheter aortic valve replacement, a discussion has been held regarding what types of management strategies would be attempted intraoperatively in the event of life-threatening complications, including whether or not the patient would be  considered a candidate for the use of cardiopulmonary bypass and/or conversion to open sternotomy for attempted surgical intervention.  The patient specifically requests that should a potentially life-threatening complication develop we would attempt emergency median sternotomy and/or other aggressive surgical procedures.  The patient has been advised of a variety of complications that might develop including but not limited to risks of death, stroke, paravalvular leak, aortic dissection or other major vascular complications, aortic annulus rupture, device embolization, cardiac rupture or perforation, mitral regurgitation, acute myocardial infarction, arrhythmia, heart block or bradycardia requiring permanent pacemaker placement, congestive heart failure, respiratory failure, renal failure, pneumonia, infection, other late complications related to structural valve deterioration or migration, or other complications that might ultimately cause a temporary or permanent loss of functional independence or other long term morbidity.  The patient provides full informed consent for the procedure as described and all questions were answered.   I spent in excess of 60 minutes during the conduct of this hospital encounter and >50% of this time involved direct face-to-face encounter with the patient for counseling and/or coordination of their care.    Rexene Alberts, MD 09/21/2020 6:33 PM

## 2020-09-21 NOTE — Progress Notes (Signed)
Occupational Therapy Evaluation Patient Details Name: Brett Williamson MRN: [id number] DOB: [birth year] Today's Date: 09/21/2020    History of Present Illness 80 yo male with PMH diastolic CHF, YOV7C, DMII, recent SDH (s/p right craniotomy 06/19/20), deconditioning (d/c from rehab on 07/05/20), HTN who presented to the ER with worsening shortness of breath especially with short distances of ambulating.   Clinical Impression   PTA pt lives alone and has intermittent assistance for IADL tasks form his daughter/family/friends. Pt states his ADL tasks have become more difficult to complete recently. Does not drive. Declined ambulation due to complaints of dizziness. BP sitting 112/65; standing 110/68; SpO2 92 RA at rest and 89 with sit - stand. Pt states he is having heart valve replacement surgery on Tuesday. Per chart pt undergoing work up for potential valve replacement. Will further assess DC recommendations after surgery. At this time, pt would benefit from DC home with [facility] and [place] and direct S for mobility, ADL tasks and medication management. Encouraged to ambulate with nsg if able and to complete chair level exercise over the weekend. Pt verbalized understanding. Will follow acutely.    Follow Up Recommendations  Other (comment) (Will further assess after valve replacement surgery)    Equipment Recommendations  None recommended by OT    Recommendations for Other Services       Precautions / Restrictions Precautions Precautions: Fall      Mobility Bed Mobility               General bed mobility comments: OOB in chair    Transfers Overall transfer level: Needs assistance Equipment used: Rolling walker (2 wheeled) Transfers: Sit to/from [facility] Sit to Stand: Min assist Stand pivot transfers: Min guard            Balance Overall balance assessment: Needs assistance   Sitting balance-Leahy Scale: Good       Standing balance-Leahy Scale: Poor                              ADL either performed or assessed with clinical judgement   ADL Overall ADL's : Needs assistance/impaired     Grooming: Set up;Sitting   Upper Body Bathing: Set up;Sitting   Lower Body Bathing: Minimal assistance;Sit to/from stand   Upper Body Dressing : Set up;Sitting   Lower Body Dressing: Minimal assistance;Sit to/from stand   Toilet Transfer: Minimal Print production planner Details (indicate cue type and reason): stand pivot Toileting- Clothing Manipulation and Hygiene: Min guard       Functional mobility during ADLs: Minimal assistance;Rolling walker General ADL Comments: easily fatigues; bathing and dressing have become more difficult; uses AE     Vision         Perception     Praxis      Pertinent Vitals/Pain Pain Assessment: Faces Faces Pain Scale: Hurts little more Pain Location: B knees with sit - stand Pain Descriptors / Indicators: Discomfort;Grimacing Pain Intervention(s): Limited activity within patient's tolerance     Hand Dominance Right   Extremity/Trunk Assessment Upper Extremity Assessment Upper Extremity Assessment: RUE deficits/detail;LUE deficits/detail RUE Deficits / Details: Apparnet RTC insufficiency, but functional; R index finger injury with contracture LUE Deficits / Details: LUE similar to R       Cervical / Trunk Assessment Cervical / Trunk Assessment: Kyphotic;Other exceptions (forward head)   Communication Communication Communication: No difficulties   Cognition Arousal/Alertness: Awake/alert Behavior During Therapy: WFL for tasks assessed/performed  Overall Cognitive Status: No family/caregiver present to determine baseline cognitive functioning                                 General Comments: most likely at baseline; will further assess   General Comments  Pt provided educaiton on congestive heart failure - will review lifestyle with CHF further    Exercises  Exercises: Other exercises Other Exercises Other Exercises: marching in standing x 10   Shoulder Instructions      Home Living Family/patient expects to be discharged to:: Private residence Living Arrangements: Alone Available Help at Discharge: Available PRN/intermittently Type of Home: Apartment Home Access: Level entry     Home Layout: One level     Bathroom Shower/Tub: Teacher, early years/pre: Handicapped height Bathroom Accessibility: Yes How Accessible: Accessible via walker Home Equipment: Grab bars - toilet;Grab bars - tub/shower;Adaptive equipment;Walker - 4 wheels;Tub bench (3 wheeled walker) Adaptive Equipment: Sock aid;Reacher Additional Comments: 32"doorways      Prior Functioning/Environment Level of Independence: Independent with assistive device(s)        Comments: Pt reports he uses a sock aid for socks. He has long standing numbness/tingling in fingertips bil hands.   Heambulates with RW.  He drives.  He grocery shops sometimes, and daughter and girlfriend assist PRN.  Pt also reports he was driving PTA        OT Problem List: Decreased strength;Decreased range of motion;Decreased activity tolerance;Impaired balance (sitting and/or standing);Decreased safety awareness;Decreased knowledge of use of DME or AE;Cardiopulmonary status limiting activity;Obesity;Impaired UE functional use;Pain      OT Treatment/Interventions: Self-care/ADL training;Therapeutic exercise;Energy conservation;DME and/or AE instruction;Therapeutic activities;Cognitive remediation/compensation;Visual/perceptual remediation/compensation;Patient/family education;Balance training    OT Goals(Current goals can be found in the care plan section) Acute Rehab OT Goals Patient Stated Goal: to have his valves replaced and get stronger Time For Goal Achievement: 10/05/20 Potential to Achieve Goals: Good  OT Frequency: Min 2X/week   Barriers to D/C:            Co-evaluation               AM-PAC OT "6 Clicks" Daily Activity     Outcome Measure Help from another person eating meals?: None Help from another person taking care of personal grooming?: A Little Help from another person toileting, which includes using toliet, bedpan, or urinal?: A Little Help from another person bathing (including washing, rinsing, drying)?: A Little Help from another person to put on and taking off regular upper body clothing?: A Little Help from another person to put on and taking off regular lower body clothing?: A Little 6 Click Score: 19   End of Session Equipment Utilized During Treatment: Rolling walker Nurse Communication: Mobility status;Other (comment) (pt complaining of dizziness)  Activity Tolerance: Other (comment) (Limited by c/o dizziness) Patient left: in chair;with call bell/phone within reach  OT Visit Diagnosis: Unsteadiness on feet (R26.81);Other abnormalities of gait and mobility (R26.89);Muscle weakness (generalized) (M62.81);Pain;Dizziness and giddiness (R42) Pain - Right/Left:  (B) Pain - part of body: Knee                Time: 6599-3570 OT Time Calculation (min): 20 min Charges:  OT General Charges $OT Visit: 1 Visit OT Evaluation $OT Eval Moderate Complexity: Maynard, OT/L   Acute OT Clinical Specialist [place] Pager [phone] Office [phone]   [facility] 09/21/2020, 3:36 PM

## 2020-09-21 NOTE — Progress Notes (Signed)
Patient refused the use of CPAP for the evening.  

## 2020-09-21 NOTE — Evaluation (Signed)
Physical Therapy Evaluation Patient Details Name: Brett Williamson MRN: [id number] DOB: 12-Sep-1940 Today's Date: 09/21/2020   History of Present Illness  80 yo male with who presented to the ER with worsening shortness of breath especially with short distances of ambulating. PMH diastolic CHF, TMH9Q, DMII, recent SDH (s/p right craniotomy 06/19/20), deconditioning (d/c from rehab on 07/05/20), HTN  Clinical Impression  Pt admitted with above diagnosis. Pt was able to ambulate in room and feel that pt is most likely at baseline as far as mobility goes. Pt states heis awaiting valve replacement surgery and plans are to have it Tuesday.  Pt has rollator he uses at home.  Knees weak and "creak" due to arthritis.  Pt with VSS during his walk today.  Will follow acutely and progress pt as able.  Pt currently with functional limitations due to the deficits listed below (see PT Problem List). Pt will benefit from skilled PT to increase their independence and safety with mobility to allow discharge to the venue listed below.      Follow Up Recommendations Other (comment) (will assess after valve replacement surgery, HHPT if pt goes home)    Equipment Recommendations  Other (comment) (TBA after surgery)    Recommendations for Other Services       Precautions / Restrictions Precautions Precautions: Fall Restrictions Weight Bearing Restrictions: No      Mobility  Bed Mobility Overal bed mobility: Needs Assistance Bed Mobility: Supine to Sit     Supine to sit: Min guard;Min assist     General bed mobility comments: a little assist with trunk to guide pt to EOB.    Transfers Overall transfer level: Needs assistance Equipment used: Rolling walker (2 wheeled) Transfers: Sit to/from [facility] Sit to Stand: Min assist Stand pivot transfers: Min guard       General transfer comment: Pt neededd steadying assist and a little assist to power  up.  Ambulation/Gait Ambulation/Gait assistance: Min guard Gait Distance (Feet): 40 Feet Assistive device: Rolling walker (2 wheeled) Gait Pattern/deviations: Step-through pattern;Decreased stride length;Drifts right/left;Wide base of support;Antalgic   Gait velocity interpretation: <1.31 ft/sec, indicative of household ambulator General Gait Details: Pt with notable bil "creaking" in knees.  Pt able to ambulate in room short distance with RW which is what pt does at home per pt. Pt is most likely close to baseline.  Stairs            Wheelchair Mobility    Modified Rankin (Stroke Patients Only)       Balance Overall balance assessment: Needs assistance Sitting-balance support: No upper extremity supported;Feet supported Sitting balance-Leahy Scale: Good     Standing balance support: Bilateral upper extremity supported;During functional activity Standing balance-Leahy Scale: Poor Standing balance comment: relies on UE support for balance                             Pertinent Vitals/Pain Pain Assessment: Faces Faces Pain Scale: Hurts little more Pain Location: B knees with sit - stand Pain Descriptors / Indicators: Discomfort;Grimacing Pain Intervention(s): Limited activity within patient's tolerance;Monitored during session;Repositioned    Home Living Family/patient expects to be discharged to:: Private residence Living Arrangements: Alone Available Help at Discharge: Available PRN/intermittently Type of Home: Apartment Home Access: Level entry     Home Layout: One level Home Equipment: Grab bars - toilet;Grab bars - tub/shower;Adaptive equipment;Walker - 4 wheels;Tub bench (3 wheeled walker) Additional Comments: 32"doorways    Prior  Function Level of Independence: Independent with assistive device(s)         Comments: Pt reports he uses a sock aid for socks. He has long standing numbness/tingling in fingertips bil hands.   Heambulates with RW.   He drives.  He grocery shops sometimes, and daughter and girlfriend assist PRN.  Pt also reports he was driving PTA     Hand Dominance   Dominant Hand: Right    Extremity/Trunk Assessment   Upper Extremity Assessment Upper Extremity Assessment: Defer to OT evaluation RUE Deficits / Details: Apparnet RTC insufficiency, but functional; R index finger injury with contracture LUE Deficits / Details: LUE similar to R    Lower Extremity Assessment Lower Extremity Assessment: RLE deficits/detail;LLE deficits/detail RLE Deficits / Details: Arthritic, grossly 3-/5 LLE Deficits / Details: Arthritic, grossly 3-/5    Cervical / Trunk Assessment Cervical / Trunk Assessment: Kyphotic;Other exceptions (forward head)  Communication   Communication: No difficulties  Cognition Arousal/Alertness: Awake/alert Behavior During Therapy: WFL for tasks assessed/performed Overall Cognitive Status: No family/caregiver present to determine baseline cognitive functioning                                 General Comments: most likely at baseline; will further assess      General Comments General comments (skin integrity, edema, etc.): VSS    Exercises Other Exercises Other Exercises: marching in standing x 10   Assessment/Plan    PT Assessment Patient needs continued PT services  PT Problem List Decreased activity tolerance;Decreased balance;Decreased mobility;Decreased knowledge of use of DME;Decreased safety awareness;Cardiopulmonary status limiting activity       PT Treatment Interventions DME instruction;Gait training;Functional mobility training;Therapeutic activities;Therapeutic exercise;Balance training;Patient/family education    PT Goals (Current goals can be found in the Care Plan section)  Acute Rehab PT Goals Patient Stated Goal: to have his valves replaced and get stronger PT Goal Formulation: With patient Time For Goal Achievement: 10/05/20 Potential to Achieve  Goals: Good    Frequency Min 3X/week   Barriers to discharge        Co-evaluation               AM-PAC PT "6 Clicks" Mobility  Outcome Measure Help needed turning from your back to your side while in a flat bed without using bedrails?: A Little Help needed moving from lying on your back to sitting on the side of a flat bed without using bedrails?: A Little Help needed moving to and from a bed to a chair (including a wheelchair)?: A Little Help needed standing up from a chair using your arms (e.g., wheelchair or bedside chair)?: A Little Help needed to walk in hospital room?: A Little Help needed climbing 3-5 steps with a railing? : A Little 6 Click Score: 18    End of Session Equipment Utilized During Treatment: Gait belt Activity Tolerance: Patient limited by fatigue Patient left: in chair;with call bell/phone within reach Nurse Communication: Mobility status PT Visit Diagnosis: Muscle weakness (generalized) (M62.81)    Time: 8182-9937 PT Time Calculation (min) (ACUTE ONLY): 20 min   Charges:   PT Evaluation $PT Eval Moderate Complexity: 1 Mod          Marien Manship W,PT Acute Rehabilitation Services Pager:  [phone]  Office:  [place] 09/21/2020, 3:58 PM

## 2020-09-21 NOTE — Progress Notes (Signed)
Patient ID: Katie Moch, male   DOB: 1940/09/01, 80 y.o.   MRN: [id number]     Advanced Heart Failure Rounding Note  PCP-Cardiologist: No primary care provider on file.   Subjective:    Continues to report improvement in breathing. Now 95% SPO2 on room air.    Co-ox is 72%, CVP remains low.  He was able to mobilize fluid without diuretic yesterday and net neg 1.6L    Objective:   Weight Range: 110.2 kg Body mass index is 33.88 kg/m.   Vital Signs:   Temp:  [97.7 F (36.5 C)-99.7 F (37.6 C)] 98.3 F (36.8 C) (02/11 0739) Pulse Rate:  [75-96] 88 (02/11 0739) Resp:  [16-20] 20 (02/11 0739) BP: (116-131)/(65-78) 120/78 (02/11 0739) SpO2:  [93 %-97 %] 95 % (02/11 0739) Weight:  [110.2 kg] 110.2 kg (02/11 0511) Last BM Date: 09/19/18  Weight change: Filed Weights   09/19/20 0407 09/20/20 0359 09/21/20 0511  Weight: 114.3 kg 113.2 kg 110.2 kg    Intake/Output:   Intake/Output Summary (Last 24 hours) at 09/21/2020 0746 Last data filed at 09/21/2020 0740 Gross per 24 hour  Intake 626.51 ml  Output 2200 ml  Net -1573.49 ml      Physical Exam    Cardiac: JVD flat, normal rate and rhythm, 3/6 high pitched AS murmur, no rubs or gallops, LE warm, wrapped in unna boots bilaterally Pulmonary: clear, not in distress Abdominal: non distended abdomen, soft and nontender Psych: Alert, conversant, in good spirits   Telemetry   NSR 80-90's with PVCs.    Labs    CBC Recent Labs    09/19/20 0429 09/20/20 0645  WBC 8.4 9.0  HGB 13.1 13.7  HCT 41.1 44.9  MCV 77.8* 78.8*  PLT 197 726   Basic Metabolic Panel Recent Labs    09/20/20 0645 09/21/20 0430  NA 132* 132*  K 4.1 4.0  CL 94* 96*  CO2 27 26  GLUCOSE 126* 143*  BUN 41* 34*  CREATININE 1.71* 1.38*  CALCIUM 8.6* 8.5*   Liver Function Tests Recent Labs    09/20/20 0645 09/21/20 0430  AST 27 26  ALT 27 28  ALKPHOS 71 59  BILITOT 1.7* 1.4*  PROT 7.3 6.8  ALBUMIN 2.9* 2.7*   No results for input(s):  LIPASE, AMYLASE in the last 72 hours. Cardiac Enzymes No results for input(s): CKTOTAL, CKMB, CKMBINDEX, TROPONINI in the last 72 hours.  BNP: BNP (last 3 results) Recent Labs    09/11/20 1600 09/12/20 0708  BNP 1,413.0* 1,561.0*    ProBNP (last 3 results) No results for input(s): PROBNP in the last 8760 hours.   D-Dimer No results for input(s): DDIMER in the last 72 hours. Hemoglobin A1C No results for input(s): HGBA1C in the last 72 hours. Fasting Lipid Panel No results for input(s): CHOL, HDL, LDLCALC, TRIG, CHOLHDL, LDLDIRECT in the last 72 hours. Thyroid Function Tests No results for input(s): TSH, T4TOTAL, T3FREE, THYROIDAB in the last 72 hours.  Invalid input(s): FREET3  Other results:  R/LHC results 09/18/20:   1. Diffuse mild non-obstructive CAD 2. Critical calcific aortic stenosis 3. Markedly elevated filling pressure with normal cardiac output on dobutamine 4. 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 EXAM: Cardiac TAVR CT TECHNIQUE: The patient was scanned on a [employer]. A 140 kV  retrospective scan was triggered in the descending thoracic aorta at 111 HU's. Gantry rotation speed was 250 msecs and collimation was .6 mm. No beta blockade or nitro were given. The 3D data set was reconstructed in 5% intervals of the R-R cycle. Systolic and diastolic phases were analyzed on a dedicated work station using MPR, MIP and VRT modes. The patient received 80 cc of contrast. FINDINGS: Image quality: Average. Noise artifact is: Mild. Valve Morphology: Tricuspid aortic valve with severely calcified leaflets with severely restricted movement in systole. The leaflets are all diffusely calcified. Aortic Valve Calcium score: 6553 Aortic annular dimension: Phase assessed: 30% Annular area: 610 mm2  Annular perimeter: 91.7 mm Max diameter: 34.2 mm Min diameter: 23.8 mm Annular and subannular calcification: None. Optimal coplanar projection: LAO 4 CRA 2 Coronary Artery Height above Annulus: Left Main: 10.3 mm Right Coronary: 14.8 mm Sinus of Valsalva Measurements: Non-coronary: 21.3 mm Right-coronary: 22.6 mm Left-coronary: 19.3 mm Sinus of Valsalva Height: Non-coronary: 38 mm Right-coronary: 35 mm Left-coronary: 38 mm Sinotubular Junction: 37 mm Ascending Thoracic Aorta: 38 mm (measured using double oblique technique). Coronary Arteries: Normal coronary origin. Right dominance. The study was performed without use of NTG and is insufficient for plaque evaluation. Please refer to recent cardiac catheterization for coronary assessment. 3-vessel coronary calcifications noted. Cardiac Morphology: Right Atrium: Right atrial size is within normal limits. Right Ventricle: The right ventricular cavity is dilated. Left Atrium: Left atrial size is normal in size with no left atrial appendage filling defect. Left Ventricle: The ventricular cavity size is within normal limits. There are no stigmata of prior infarction. There is no abnormal filling defect. Left ventricular function is moderately reduced, 36%. There is global hypokinesis. Pulmonary arteries: Dilated suggestive of pulmonary hypertension. Pulmonary veins: Normal pulmonary venous drainage. Pericardium: Normal thickness with no significant effusion or calcium present. Mitral Valve: The mitral valve is normal structure with moderate mitral annular calcification. Extra-cardiac findings: See attached radiology report for non-cardiac structures. IMPRESSION: 1. 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 2. Annular measurements appropriate for 29 mm Sapien 3 TAVR (610 mm2). 3. No significant annular or subannular calcifications. 4. Borderline LCA height, 10.3 mm.  Sufficient RCA height, 14.8 mm. 5. Optimal Fluoroscopic Angle for Delivery:  LAO 4 CRA 2 6. Dilated pulmonary artery suggestive of pulmonary hypertension. 7. Moderately reduced LV function, EF 36%.  Global hypokinesis. [place] T. Audie Box, MD Electronically Signed   By: Eleonore Chiquito   On: 09/20/2020 21:22   Result Date: 09/20/2020 EXAM: OVER-READ INTERPRETATION  CT CHEST The following report is an over-read performed by radiologist Dr. Salvatore Marvel of [facility], [name] on 09/20/2020. This over-read does not include interpretation of cardiac or coronary anatomy or pathology. The coronary CTA interpretation by the cardiologist is attached. COMPARISON:  10/05/2020 chest radiograph. FINDINGS: Please see the separate concurrent chest CT angiogram report for details. IMPRESSION: Please see the separate concurrent chest CT angiogram report for details. Electronically Signed: By: Ilona Sorrel M.D. On: 09/20/2020 13:59   CT ANGIO CHEST AORTA W/CM & OR WO/CM  Result Date: 09/20/2020 CLINICAL DATA:  Inpatient. Severe aortic stenosis. Pre-TAVR planning. EXAM: CT ANGIOGRAPHY CHEST, ABDOMEN AND PELVIS TECHNIQUE: Multidetector CT imaging through the chest, abdomen and pelvis was performed using the standard protocol during bolus administration of intravenous contrast. Multiplanar reconstructed images and MIPs were obtained and reviewed to evaluate the vascular anatomy. CONTRAST:  86mL OMNIPAQUE IOHEXOL 350 MG/ML SOLN COMPARISON:  09/15/2020 chest radiograph. FINDINGS: CTA CHEST FINDINGS Cardiovascular:  Mild cardiomegaly. No significant pericardial effusion/thickening. Diffusely thickened and coarsely calcified aortic valve. Three-vessel coronary atherosclerosis. Left PICC terminates at the cavoatrial junction. 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 Dilated main pulmonary artery (4.0 cm diameter). No central pulmonary emboli. Mediastinum/Nodes: Coarse calcified 1.6 cm right thyroid nodule. Unremarkable esophagus. No pathologically enlarged axillary, mediastinal  or hilar lymph nodes. Lungs/Pleura: No pneumothorax. Small dependent bilateral pleural effusions, right greater than left. Patchy mild hazy ground-glass opacity in both lungs with mild mosaic attenuation and faint interlobular septal thickening. Moderate passive atelectasis in the dependent lower lobes bilaterally. No lung masses or significant pulmonary nodules in the aerated portions of the lungs. Musculoskeletal: No aggressive appearing focal osseous lesions. Soft tissue anchors in the left humeral head. Marked thoracic spondylosis. Hypodense superficial subcutaneous 1.8 cm lesion in the medial ventral left chest wall (series 4/image 53), nonspecific, potentially a sebaceous cyst. CTA ABDOMEN AND PELVIS FINDINGS Hepatobiliary: Suggestion of a finely irregular liver surface, cannot exclude cirrhosis. No liver masses. Cholelithiasis. No gallbladder wall thickening. No biliary ductal dilatation. Pancreas: Normal, with no mass or duct dilation. Spleen: Mild splenomegaly. Craniocaudal splenic length 15.5 cm. No splenic masses. Adrenals/Urinary Tract: Normal right adrenal. Left adrenal 2.9 cm nodule with density -5 HU, compatible with a benign adenoma. Heterogeneous hyperdense 1.7 cm exophytic renal cortical mass in the anterior lower left kidney (series 4/image 140). Simple 1.7 cm posterior lower right renal cyst. Scattered moderate renal cortical scarring throughout the left kidney. No hydronephrosis. Bladder collapsed by indwelling Foley catheter, limiting evaluation. Stomach/Bowel: Normal non-distended stomach. Normal caliber small bowel with no small bowel wall thickening. Appendix not discretely visualized. Normal large bowel with no diverticulosis, large bowel wall thickening or pericolonic fat stranding. Vascular/Lymphatic: Atherosclerotic nonaneurysmal abdominal aorta. Small paraumbilical varix. No pathologically enlarged lymph nodes in the abdomen or pelvis. Reproductive: Top-normal size prostate. Other: No  pneumoperitoneum, ascites or focal fluid collection. Musculoskeletal: No aggressive appearing focal osseous lesions. Marked lumbar spondylosis. VASCULAR MEASUREMENTS PERTINENT TO TAVR: AORTA: Minimal Aortic Diameter-19.2 x 16.9 mm Severity of Aortic Calcification-moderate RIGHT PELVIS: Right Common Iliac Artery - Minimal Diameter-13.1 x 11.7 mm Tortuosity-mild Calcification-mild Right External Iliac Artery - Minimal Diameter-10.0 x 9.6 mm Tortuosity-severe Calcification-none Right Common Femoral Artery - Minimal Diameter-9.5 x 9.0 mm Tortuosity-mild Calcification-mild LEFT PELVIS: Left Common Iliac Artery - Minimal Diameter-12.6 x 11.7 mm Tortuosity-moderate Calcification-mild Left External Iliac Artery - Minimal Diameter-9.2 x 8.9 mm Tortuosity-mild Calcification-none Left Common Femoral Artery - Minimal Diameter-9.2 x 9.0 mm Tortuosity-mild Calcification-none Review of the MIP images confirms the above findings. IMPRESSION: 1. Vascular findings and measurements pertinent to potential TAVR procedure, as detailed. 2. Diffusely thickening and coarsely calcified aortic valve, compatible with reported history of severe aortic stenosis. 3. Mild cardiomegaly. Three-vessel coronary atherosclerosis. 4. Dilated main pulmonary artery, suggesting pulmonary arterial hypertension. 5. 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 Recommend annual imaging followup by CTA or MRA. This recommendation follows 2010 ACCF/AHA/AATS/ACR/ASA/SCA/SCAI/SIR/STS/SVM Guidelines for the Diagnosis and Management of Patients with Thoracic Aortic Disease. Circulation. 2010; 121: G626-R485. Aortic aneurysm NOS (ICD10-I71.9). 6. Patchy mild hazy ground-glass opacity in both lungs with faint interlobular septal thickening, favor mild pulmonary edema. Small dependent bilateral pleural effusions, right greater than left. 7. Exophytic heterogeneous hyperdense 1.7 cm renal cortical mass in anterior lower left kidney, renal cell carcinoma not excluded. MRI or  CT abdomen without and with IV contrast is indicated for further evaluation. 8. Suggestion of a finely irregular liver surface, cannot exclude cirrhosis. No liver masses. Consider hepatic  elastography for further liver fibrosis risk stratification, as clinically warranted. 9. Mild splenomegaly. Small paraumbilical varix. No ascites. 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 11. Cholelithiasis. 12. Aortic Atherosclerosis (ICD10-I70.0). Electronically Signed   By: Ilona Sorrel M.D.   On: 09/20/2020 14:38   CT Angio Abd/Pel w/ and/or w/o  Result Date: 09/20/2020 CLINICAL DATA:  Inpatient. Severe aortic stenosis. Pre-TAVR planning. EXAM: CT ANGIOGRAPHY CHEST, ABDOMEN AND PELVIS TECHNIQUE: Multidetector CT imaging through the chest, abdomen and pelvis was performed using the standard protocol during bolus administration of intravenous contrast. Multiplanar reconstructed images and MIPs were obtained and reviewed to evaluate the vascular anatomy. CONTRAST:  34mL OMNIPAQUE IOHEXOL 350 MG/ML SOLN COMPARISON:  09/15/2020 chest radiograph. FINDINGS: CTA CHEST FINDINGS Cardiovascular: Mild cardiomegaly. No significant pericardial effusion/thickening. Diffusely thickened and coarsely calcified aortic valve. Three-vessel coronary atherosclerosis. Left PICC terminates at the cavoatrial junction. 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 Dilated main pulmonary artery (4.0 cm diameter). No central pulmonary emboli. Mediastinum/Nodes: Coarse calcified 1.6 cm right thyroid nodule. Unremarkable esophagus. No pathologically enlarged axillary, mediastinal or hilar lymph nodes. Lungs/Pleura: No pneumothorax. Small dependent bilateral pleural effusions, right greater than left. Patchy mild hazy ground-glass opacity in both lungs with mild mosaic attenuation and faint interlobular septal thickening. Moderate passive atelectasis in the dependent lower lobes bilaterally. No lung masses or significant pulmonary nodules  in the aerated portions of the lungs. Musculoskeletal: No aggressive appearing focal osseous lesions. Soft tissue anchors in the left humeral head. Marked thoracic spondylosis. Hypodense superficial subcutaneous 1.8 cm lesion in the medial ventral left chest wall (series 4/image 53), nonspecific, potentially a sebaceous cyst. CTA ABDOMEN AND PELVIS FINDINGS Hepatobiliary: Suggestion of a finely irregular liver surface, cannot exclude cirrhosis. No liver masses. Cholelithiasis. No gallbladder wall thickening. No biliary ductal dilatation. Pancreas: Normal, with no mass or duct dilation. Spleen: Mild splenomegaly. Craniocaudal splenic length 15.5 cm. No splenic masses. Adrenals/Urinary Tract: Normal right adrenal. Left adrenal 2.9 cm nodule with density -5 HU, compatible with a benign adenoma. Heterogeneous hyperdense 1.7 cm exophytic renal cortical mass in the anterior lower left kidney (series 4/image 140). Simple 1.7 cm posterior lower right renal cyst. Scattered moderate renal cortical scarring throughout the left kidney. No hydronephrosis. Bladder collapsed by indwelling Foley catheter, limiting evaluation. Stomach/Bowel: Normal non-distended stomach. Normal caliber small bowel with no small bowel wall thickening. Appendix not discretely visualized. Normal large bowel with no diverticulosis, large bowel wall thickening or pericolonic fat stranding. Vascular/Lymphatic: Atherosclerotic nonaneurysmal abdominal aorta. Small paraumbilical varix. No pathologically enlarged lymph nodes in the abdomen or pelvis. Reproductive: Top-normal size prostate. Other: No pneumoperitoneum, ascites or focal fluid collection. Musculoskeletal: No aggressive appearing focal osseous lesions. Marked lumbar spondylosis. VASCULAR MEASUREMENTS PERTINENT TO TAVR: AORTA: Minimal Aortic Diameter-19.2 x 16.9 mm Severity of Aortic Calcification-moderate RIGHT PELVIS: Right Common Iliac Artery - Minimal Diameter-13.1 x 11.7 mm Tortuosity-mild  Calcification-mild Right External Iliac Artery - Minimal Diameter-10.0 x 9.6 mm Tortuosity-severe Calcification-none Right Common Femoral Artery - Minimal Diameter-9.5 x 9.0 mm Tortuosity-mild Calcification-mild LEFT PELVIS: Left Common Iliac Artery - Minimal Diameter-12.6 x 11.7 mm Tortuosity-moderate Calcification-mild Left External Iliac Artery - Minimal Diameter-9.2 x 8.9 mm Tortuosity-mild Calcification-none Left Common Femoral Artery - Minimal Diameter-9.2 x 9.0 mm Tortuosity-mild Calcification-none Review of the MIP images confirms the above findings. IMPRESSION: 1. Vascular findings and measurements pertinent to potential TAVR procedure, as detailed. 2. Diffusely thickening and coarsely calcified aortic valve, compatible with reported history of severe aortic stenosis. 3. Mild cardiomegaly. Three-vessel coronary atherosclerosis. 4. Dilated  main pulmonary artery, suggesting pulmonary arterial hypertension. 5. 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 Recommend annual imaging followup by CTA or MRA. This recommendation follows 2010 ACCF/AHA/AATS/ACR/ASA/SCA/SCAI/SIR/STS/SVM Guidelines for the Diagnosis and Management of Patients with Thoracic Aortic Disease. Circulation. 2010; 121: E332-R518. Aortic aneurysm NOS (ICD10-I71.9). 6. Patchy mild hazy ground-glass opacity in both lungs with faint interlobular septal thickening, favor mild pulmonary edema. Small dependent bilateral pleural effusions, right greater than left. 7. Exophytic heterogeneous hyperdense 1.7 cm renal cortical mass in anterior lower left kidney, renal cell carcinoma not excluded. MRI or CT abdomen without and with IV contrast is indicated for further evaluation. 8. Suggestion of a finely irregular liver surface, cannot exclude cirrhosis. No liver masses. Consider hepatic elastography for further liver fibrosis risk stratification, as clinically warranted. 9. Mild splenomegaly. Small paraumbilical varix. No ascites. 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  11. Cholelithiasis. 12. Aortic Atherosclerosis (ICD10-I70.0). Electronically Signed   By: Ilona Sorrel M.D.   On: 09/20/2020 14:38     Medications:     Scheduled Medications: . aspirin EC  81 mg Oral Daily  . Chlorhexidine Gluconate Cloth  6 each Topical Daily  . fluconazole  100 mg Oral Daily  . insulin aspart  0-15 Units Subcutaneous TID WC  . insulin aspart  0-5 Units Subcutaneous QHS  . mouth rinse  15 mL Mouth Rinse BID  . nystatin   Topical TID  . sodium chloride flush  10-40 mL Intracatheter Q12H  . sodium chloride flush  3 mL Intravenous Q12H    Infusions: . sodium chloride 10 mL/hr at 09/15/20 0400  . sodium chloride    . DOBUTamine 2.5 mcg/kg/min (09/21/20 0400)    PRN Medications: sodium chloride, sodium chloride, acetaminophen, Gerhardt's butt cream, guaiFENesin-dextromethorphan, ondansetron (ZOFRAN) IV, sodium chloride flush, sodium chloride flush    Assessment/Plan   1. 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  ECHO 09/12/20 with EF down to 35-40%, worsening of AVA to 0.24 cm2 and mean gradient now 65.7 mmHg, new RWMA, new severely reduced RV function.  Likely due to worsening AS He has diffuse non obstructive CAD by LHC on 09/18/20.     -Output 5L documented yesterday and weight down another 2.5lbs, CVP down to 5 slight bump in cr hold lasix today -Continue dobutamine 2.5.   2. Severe Aortic Stenosis:  Critical stenosis, significantly progressed since 06/2020. By echo, worsening of AVA to 0.24 cm2 and mean gradient now 65.7 mmHg - Will try to optimize patient so he can complete workup for possible TAVR, Dr Burt Knack following.  - R/LHC done on 2/8,  CT scans ordered for today  3. AKI on CKD 3b: Baseline cr around 1.6. peaked at 2.36  Currently staying around baseline - Continue dobutamine.  - holding lasix today  4. Hx of Traumatic  subdural hematoma: 11/21 with craniotomy/evacuation.  He is on SCDs for now for DVT prophylaxis.    5. Renal mass on CT - plan MRI to further evalaute  Length of Stay: [place], MD  09/21/2020, 7:46 AM  Advanced Heart Failure Team Pager [phone] (M-F; [place])  Please contact [facility] for night-coverage after hours (4p -7a ) and weekends on [email or web address]    Patient seen and examined with the above-signed Advanced Practice Provider and/or Housestaff. I personally reviewed laboratory data, imaging studies and relevant notes. I independently examined the patient  and formulated the important aspects of the plan. I have edited the note to reflect any of my changes or salient points. I have personally discussed the plan with the patient and/or family.  Feeling better. No SOB, orthopnea or PND. Creatinine stable/improved after CTs. Remains on dobutamine. CVP.and weight down. Off lasix   CTs show R renal mass and possible cirrhosis  General: Lying in bed No resp difficulty HEENT: normal Neck: supple. no JVD. Carotids 2+ bilat; + bruits. No lymphadenopathy or thryomegaly appreciated. Cor: PMI nondisplaced. Regular rate & rhythm. 3/6 AS Lungs: clear Abdomen: soft, nontender, nondistended. No hepatosplenomegaly. No bruits or masses. Good bowel sounds. Extremities: no cyanosis, clubbing, rash, edema Neuro: alert & orientedx3, cranial nerves grossly intact. moves all 4 extremities w/o difficulty. Affect pleasant  Remains on dobutamine. Co-ox stable. CVP low. Can continue to hold lasix. Will plan ab MRI to evaluate R renal mass. D/w structural team. Probable TAVR next week. Continue PT/OT. MCV is low. Check iron stores. Will likely need Feraheme.   Glori Bickers, MD  8:34 AM

## 2020-09-22 ENCOUNTER — Inpatient Hospital Stay (HOSPITAL_COMMUNITY): Payer: Medicare Other

## 2020-09-22 LAB — COMPREHENSIVE METABOLIC PANEL
ALT: 33 U/L (ref 0–44)
AST: 31 U/L (ref 15–41)
Albumin: 2.9 g/dL — ABNORMAL LOW (ref 3.5–5.0)
Alkaline Phosphatase: 69 U/L (ref 38–126)
Anion gap: 10 (ref 5–15)
BUN: 35 mg/dL — ABNORMAL HIGH (ref 8–23)
CO2: 25 mmol/L (ref 22–32)
Calcium: 8.9 mg/dL (ref 8.9–10.3)
Chloride: 99 mmol/L (ref 98–111)
Creatinine, Ser: 1.42 mg/dL — ABNORMAL HIGH (ref 0.61–1.24)
GFR, Estimated: 50 mL/min — ABNORMAL LOW (ref >60)
Glucose, Bld: 123 mg/dL — ABNORMAL HIGH (ref 70–99)
Potassium: 4.3 mmol/L (ref 3.5–5.1)
Sodium: 134 mmol/L — ABNORMAL LOW (ref 135–145)
Total Bilirubin: 1.3 mg/dL — ABNORMAL HIGH (ref 0.3–1.2)
Total Protein: 7 g/dL (ref 6.5–8.1)

## 2020-09-22 LAB — COOXEMETRY PANEL
Carboxyhemoglobin: 1.4 % (ref 0.5–1.5)
Methemoglobin: 0.8 % (ref 0.0–1.5)
O2 Saturation: 66.2 %
Total hemoglobin: 14.1 g/dL (ref 12.0–16.0)

## 2020-09-22 LAB — GLUCOSE, CAPILLARY
Glucose-Capillary: 132 mg/dL — ABNORMAL HIGH (ref 70–99)
Glucose-Capillary: 146 mg/dL — ABNORMAL HIGH (ref 70–99)
Glucose-Capillary: 126 mg/dL — ABNORMAL HIGH (ref 70–99)
Glucose-Capillary: 149 mg/dL — ABNORMAL HIGH (ref 70–99)

## 2020-09-22 MED ORDER — SODIUM CHLORIDE 0.9 % IV SOLN
510.0000 mg | Freq: Once | INTRAVENOUS | Status: AC
  Administered 2020-09-22: 510 mg via INTRAVENOUS
  Filled 2020-09-22: qty 17

## 2020-09-22 MED ORDER — GADOBUTROL 1 MMOL/ML IV SOLN
10.0000 mL | Freq: Once | INTRAVENOUS | Status: AC | PRN
  Administered 2020-09-22: 10 mL via INTRAVENOUS

## 2020-09-22 MED ORDER — ROSUVASTATIN CALCIUM 20 MG PO TABS
20.0000 mg | ORAL_TABLET | Freq: Every day | ORAL | Status: DC
  Administered 2020-09-22 – 2020-09-29 (×7): 20 mg via ORAL
  Filled 2020-09-22 (×8): qty 1

## 2020-09-22 NOTE — Progress Notes (Signed)
Patient ID: Brett Williamson, male   DOB: 1941-07-05, 80 y.o.   MRN: [id number]     Advanced Heart Failure Rounding Note  PCP-Cardiologist: No primary care provider on file.   Subjective:    Remains on DBA at 2.5. Denies orthopnea or PND. No CP. Co-ox 66%  Ab MRI done this am. Results pending (? Right kidney lesion)   Objective:   Weight Range: 109.2 kg Body mass index is 33.58 kg/m.   Vital Signs:   Temp:  [97.9 F (36.6 C)-98.5 F (36.9 C)] 98 F (36.7 C) (02/12 0825) Pulse Rate:  [79-88] 88 (02/12 0825) Resp:  [17-22] 22 (02/12 0825) BP: (106-127)/(71-75) 114/71 (02/12 0825) SpO2:  [93 %-96 %] 96 % (02/12 0825) Weight:  [109.2 kg] 109.2 kg (02/12 0601) Last BM Date: 09/19/20  Weight change: Filed Weights   09/20/20 0359 09/21/20 0511 09/22/20 0601  Weight: 113.2 kg 110.2 kg 109.2 kg    Intake/Output:   Intake/Output Summary (Last 24 hours) at 09/22/2020 0940 Last data filed at 09/22/2020 0919 Gross per 24 hour  Intake 458.37 ml  Output 1000 ml  Net -541.63 ml      Physical Exam    General:  Sitting up in bed. No resp difficulty HEENT: normal Neck: supple. no JVD. Carotids 2+ bilat; + bruits. No lymphadenopathy or thryomegaly appreciated. Cor: PMI nondisplaced. Regular rate & rhythm. 3/6 AS Lungs: clear Abdomen: soft, nontender, nondistended. No hepatosplenomegaly. No bruits or masses. Good bowel sounds. Extremities: no cyanosis, clubbing, rash, edema Neuro: alert & orientedx3, cranial nerves grossly intact. moves all 4 extremities w/o difficulty. Affect pleasant    Telemetry   NSR 80s with PVCs.  Personally reviewed   Labs    CBC Recent Labs    09/20/20 0645  WBC 9.0  HGB 13.7  HCT 44.9  MCV 78.8*  PLT 782   Basic Metabolic Panel Recent Labs    09/21/20 0430 09/22/20 0251  NA 132* 134*  K 4.0 4.3  CL 96* 99  CO2 26 25  GLUCOSE 143* 123*  BUN 34* 35*  CREATININE 1.38* 1.42*  CALCIUM 8.5* 8.9   Liver Function Tests Recent Labs     09/21/20 0430 09/22/20 0251  AST 26 31  ALT 28 33  ALKPHOS 59 69  BILITOT 1.4* 1.3*  PROT 6.8 7.0  ALBUMIN 2.7* 2.9*   No results for input(s): LIPASE, AMYLASE in the last 72 hours. Cardiac Enzymes No results for input(s): CKTOTAL, CKMB, CKMBINDEX, TROPONINI in the last 72 hours.  BNP: BNP (last 3 results) Recent Labs    09/11/20 1600 09/12/20 0708  BNP 1,413.0* 1,561.0*    ProBNP (last 3 results) No results for input(s): PROBNP in the last 8760 hours.   D-Dimer No results for input(s): DDIMER in the last 72 hours. Hemoglobin A1C No results for input(s): HGBA1C in the last 72 hours. Fasting Lipid Panel No results for input(s): CHOL, HDL, LDLCALC, TRIG, CHOLHDL, LDLDIRECT in the last 72 hours. Thyroid Function Tests No results for input(s): TSH, T4TOTAL, T3FREE, THYROIDAB in the last 72 hours.  Invalid input(s): FREET3  Other results:  R/LHC results 09/18/20:   1. Diffuse mild non-obstructive CAD 2. Critical calcific aortic stenosis 3. Markedly elevated filling pressure with normal cardiac output on dobutamine 4. 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   Medications:     Scheduled Medications: . aspirin EC  81 mg Oral Daily  . Chlorhexidine Gluconate Cloth  6 each  Topical Daily  . insulin aspart  0-15 Units Subcutaneous TID WC  . insulin aspart  0-5 Units Subcutaneous QHS  . mouth rinse  15 mL Mouth Rinse BID  . nystatin   Topical TID  . sodium chloride flush  10-40 mL Intracatheter Q12H  . sodium chloride flush  3 mL Intravenous Q12H    Infusions: . sodium chloride 10 mL/hr at 09/15/20 0400  . sodium chloride    . DOBUTamine Stopped (09/22/20 0311)    PRN Medications: sodium chloride, sodium chloride, acetaminophen, Gerhardt's butt cream, guaiFENesin-dextromethorphan, ondansetron (ZOFRAN) IV, sodium chloride flush, sodium chloride flush    Assessment/Plan   1. 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  ECHO 09/12/20 with EF down to 35-40%, worsening of AVA to 0.24 cm2 and mean gradient now 65.7 mmHg, new RWMA, new severely reduced RV function.  Likely due to worsening AS He has diffuse non obstructive CAD by LHC on 09/18/20.     - co-ox 66% on DBA 2.5  Will continue - volume status ok. Off diuretics - no b-blocker with low output - holding off on ARNI, spiro with AKI and high-grade AS  2. Severe Aortic Stenosis:  Critical stenosis, significantly progressed since 06/2020. By echo, worsening of AVA to 0.24 cm2 and mean gradient now 65.7 mmHg - R/LHC done on 2/8,  CT scans complete - Possible TAVR on Tuesday  3. AKI on CKD 3b: Baseline cr around 1.6. peaked at 2.36  Currently staying around baseline - Creatinine improved to 1.4 - Continue dobutamine.  - holding lasix   4. 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 Hx of Traumatic subdural hematoma: 11/21 with craniotomy/evacuation.  He is on SCDs for now for DVT prophylaxis.    6. Renal mass on CT - MRI complete. Await results  7. Iron deficiency - will give Feraheme  8. Deconditioning - PT/OT  Length of Stay: 11  Glori Bickers, MD  09/22/2020, 9:40 AM  Advanced Heart Failure Team Pager [phone] (M-F; 7a - 4p)  Please contact [facility] for night-coverage after hours (4p -7a ) and weekends on [email or web address]

## 2020-09-22 NOTE — Plan of Care (Signed)

## 2020-09-22 NOTE — Progress Notes (Signed)
Dressing changed on lower extremities per orders.

## 2020-09-22 NOTE — Progress Notes (Signed)
Patient refused CPAP tonight 

## 2020-09-23 LAB — CBC
HCT: 42.6 % (ref 39.0–52.0)
Hemoglobin: 13.4 g/dL (ref 13.0–17.0)
MCH: 24.7 pg — ABNORMAL LOW (ref 26.0–34.0)
MCHC: 31.5 g/dL (ref 30.0–36.0)
MCV: 78.5 fL — ABNORMAL LOW (ref 80.0–100.0)
Platelets: 217 10*3/uL (ref 150–400)
RBC: 5.43 MIL/uL (ref 4.22–5.81)
RDW: 15.8 % — ABNORMAL HIGH (ref 11.5–15.5)
WBC: 8.2 10*3/uL (ref 4.0–10.5)
nRBC: 0 % (ref 0.0–0.2)

## 2020-09-23 LAB — GLUCOSE, CAPILLARY
Glucose-Capillary: 113 mg/dL — ABNORMAL HIGH (ref 70–99)
Glucose-Capillary: 114 mg/dL — ABNORMAL HIGH (ref 70–99)
Glucose-Capillary: 201 mg/dL — ABNORMAL HIGH (ref 70–99)
Glucose-Capillary: 119 mg/dL — ABNORMAL HIGH (ref 70–99)

## 2020-09-23 LAB — CREATININE, SERUM
Creatinine, Ser: 1.43 mg/dL — ABNORMAL HIGH (ref 0.61–1.24)
GFR, Estimated: 50 mL/min — ABNORMAL LOW (ref >60)

## 2020-09-23 LAB — COOXEMETRY PANEL
Carboxyhemoglobin: 1.3 % (ref 0.5–1.5)
Methemoglobin: 0.7 % (ref 0.0–1.5)
O2 Saturation: 72.2 %
Total hemoglobin: 13.6 g/dL (ref 12.0–16.0)

## 2020-09-23 MED ORDER — SODIUM CHLORIDE 0.9 % IV SOLN
1.0000 g | INTRAVENOUS | Status: DC
  Administered 2020-09-23 – 2020-09-29 (×6): 1 g via INTRAVENOUS
  Filled 2020-09-23 (×3): qty 1
  Filled 2020-09-23 (×2): qty 10
  Filled 2020-09-23: qty 1

## 2020-09-23 MED ORDER — TORSEMIDE 20 MG PO TABS
40.0000 mg | ORAL_TABLET | Freq: Every day | ORAL | Status: DC
  Administered 2020-09-23 – 2020-09-24 (×2): 40 mg via ORAL
  Filled 2020-09-23 (×2): qty 2

## 2020-09-23 MED ORDER — ENOXAPARIN SODIUM 30 MG/0.3ML ~~LOC~~ SOLN
30.0000 mg | Freq: Every day | SUBCUTANEOUS | Status: DC
  Administered 2020-09-23 – 2020-09-24 (×2): 30 mg via SUBCUTANEOUS
  Filled 2020-09-23 (×2): qty 0.3

## 2020-09-23 MED ORDER — CEFTRIAXONE SODIUM 1 G IJ SOLR: 1.0000 g | INTRAMUSCULAR | Status: DC

## 2020-09-23 NOTE — Progress Notes (Addendum)
Patient ID: Brett Williamson, male   DOB: July 23, 1941, 80 y.o.   MRN: [id number]     Advanced Heart Failure Rounding Note  PCP-Cardiologist: No primary care provider on file.   Subjective:    Remains on DBA at 2.5 Co-ox 72%. Feels ok. No CP or SOB.   Off diuretics. CVP 7-8   Objective:   Weight Range: 109.7 kg Body mass index is 33.73 kg/m.   Vital Signs:   Temp:  [97.9 F (36.6 C)-98.7 F (37.1 C)] 98.7 F (37.1 C) (02/13 1101) Pulse Rate:  [75-93] 75 (02/13 1101) Resp:  [15-24] 24 (02/13 1101) BP: (108-122)/(65-87) 121/71 (02/13 1101) SpO2:  [92 %-96 %] 93 % (02/13 1101) Weight:  [109.7 kg] 109.7 kg (02/13 0402) Last BM Date: 09/22/20  Weight change: Filed Weights   09/21/20 0511 09/22/20 0601 09/23/20 0402  Weight: 110.2 kg 109.2 kg 109.7 kg    Intake/Output:   Intake/Output Summary (Last 24 hours) at 09/23/2020 1118 Last data filed at 09/23/2020 1324 Gross per 24 hour  Intake 760.66 ml  Output 2650 ml  Net -1889.34 ml      Physical Exam    General:  Lying in bed. No resp difficulty HEENT: normal Neck: supple. JVP 7-8 Carotids 2+ bilat; + bruits. No lymphadenopathy or thryomegaly appreciated. Cor: PMI nondisplaced. Regular rate & rhythm. + AS Lungs: clear Abdomen: soft, nontender, nondistended. No hepatosplenomegaly. No bruits or masses. Good bowel sounds. Extremities: no cyanosis, clubbing, rash, tr edema + Foley  Neuro: alert & orientedx3, cranial nerves grossly intact. moves all 4 extremities w/o difficulty. Affect pleasant    Telemetry   NSR 70-80s + PVCs. Personally reviewed   Labs    CBC No results for input(s): WBC, NEUTROABS, HGB, HCT, MCV, PLT in the last 72 hours. Basic Metabolic Panel Recent Labs    09/21/20 0430 09/22/20 0251  NA 132* 134*  K 4.0 4.3  CL 96* 99  CO2 26 25  GLUCOSE 143* 123*  BUN 34* 35*  CREATININE 1.38* 1.42*  CALCIUM 8.5* 8.9   Liver Function Tests Recent Labs    09/21/20 0430 09/22/20 0251  AST 26 31   ALT 28 33  ALKPHOS 59 69  BILITOT 1.4* 1.3*  PROT 6.8 7.0  ALBUMIN 2.7* 2.9*   No results for input(s): LIPASE, AMYLASE in the last 72 hours. Cardiac Enzymes No results for input(s): CKTOTAL, CKMB, CKMBINDEX, TROPONINI in the last 72 hours.  BNP: BNP (last 3 results) Recent Labs    09/11/20 1600 09/12/20 0708  BNP 1,413.0* 1,561.0*    ProBNP (last 3 results) No results for input(s): PROBNP in the last 8760 hours.   D-Dimer No results for input(s): DDIMER in the last 72 hours. Hemoglobin A1C No results for input(s): HGBA1C in the last 72 hours. Fasting Lipid Panel No results for input(s): CHOL, HDL, LDLCALC, TRIG, CHOLHDL, LDLDIRECT in the last 72 hours. Thyroid Function Tests No results for input(s): TSH, T4TOTAL, T3FREE, THYROIDAB in the last 72 hours.  Invalid input(s): FREET3  Other results:  R/LHC results 09/18/20:   1. Diffuse mild non-obstructive CAD 2. Critical calcific aortic stenosis 3. Markedly elevated filling pressure with normal cardiac output on dobutamine 4. 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   Medications:     Scheduled Medications: . aspirin EC  81 mg Oral Daily  . Chlorhexidine Gluconate Cloth  6 each Topical Daily  . insulin aspart  0-15 Units Subcutaneous TID WC  .  insulin aspart  0-5 Units Subcutaneous QHS  . mouth rinse  15 mL Mouth Rinse BID  . nystatin   Topical TID  . rosuvastatin  20 mg Oral Daily  . sodium chloride flush  10-40 mL Intracatheter Q12H  . sodium chloride flush  3 mL Intravenous Q12H    Infusions: . sodium chloride 10 mL/hr at 09/15/20 0400  . sodium chloride    . DOBUTamine 2.5 mcg/kg/min (09/23/20 0400)    PRN Medications: sodium chloride, sodium chloride, acetaminophen, Gerhardt's butt cream, guaiFENesin-dextromethorphan, ondansetron (ZOFRAN) IV, sodium chloride flush, sodium chloride flush    Assessment/Plan   1. 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  ECHO 09/12/20 with EF down to 35-40%, worsening of AVA to 0.24 cm2 and mean gradient now 65.7 mmHg, new RWMA, new severely reduced RV function.  Likely due to worsening AS He has diffuse non obstructive CAD by LHC on 09/18/20.     - co-ox 72% on DBA 2.5  Will continue - volume status increasing slowly. CVP 8  Start torsemide 40 daily - no b-blocker with low output - holding off on ARNI, spiro with AKI and high-grade AS  2. Severe Aortic Stenosis:  Critical stenosis, significantly progressed since 06/2020. By echo, worsening of AVA to 0.24 cm2 and mean gradient now 65.7 mmHg - R/LHC done on 2/8,  CT scans complete - Planning for TAVR on Tuesday - Need to get Foley out.   3. AKI on CKD 3b: Baseline cr around 1.6. peaked at 2.36  Currently staying around baseline - Creatinine now stable at 1.4. Recheck BMET tomorrow.  - Continue dobutamine.  - Resume diuretics   4. 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 Hx of Traumatic subdural hematoma: 11/21 with craniotomy/evacuation.  He is on SCDs for now for DVT prophylaxis.   - can add low-dose lovenox  6. Renal mass on CT - MRI complete. 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 Iron deficiency - Received Ferahem 2/12  8. Deconditioning - PT/OT  Length of Stay: [place], MD  09/23/2020, 11:18 AM  Advanced Heart Failure Team Pager [phone] (M-F; 7a - 4p)  Please contact [facility] for night-coverage after hours (4p -7a ) and weekends on [email or web address]

## 2020-09-23 NOTE — Plan of Care (Signed)

## 2020-09-24 ENCOUNTER — Inpatient Hospital Stay (HOSPITAL_COMMUNITY): Payer: Medicare Other

## 2020-09-24 LAB — URINALYSIS, ROUTINE W REFLEX MICROSCOPIC
Bilirubin Urine: NEGATIVE
Glucose, UA: NEGATIVE mg/dL
Ketones, ur: NEGATIVE mg/dL
Nitrite: NEGATIVE
Protein, ur: NEGATIVE mg/dL
RBC / HPF: >50 RBC/hpf — ABNORMAL HIGH (ref 0–5)
Specific Gravity, Urine: 1.005 (ref 1.005–1.030)
pH: 6 (ref 5.0–8.0)

## 2020-09-24 LAB — GLUCOSE, CAPILLARY
Glucose-Capillary: 86 mg/dL (ref 70–99)
Glucose-Capillary: 153 mg/dL — ABNORMAL HIGH (ref 70–99)
Glucose-Capillary: 141 mg/dL — ABNORMAL HIGH (ref 70–99)
Glucose-Capillary: 142 mg/dL — ABNORMAL HIGH (ref 70–99)

## 2020-09-24 LAB — BASIC METABOLIC PANEL
Anion gap: 10 (ref 5–15)
BUN: 37 mg/dL — ABNORMAL HIGH (ref 8–23)
CO2: 25 mmol/L (ref 22–32)
Calcium: 8.5 mg/dL — ABNORMAL LOW (ref 8.9–10.3)
Chloride: 98 mmol/L (ref 98–111)
Creatinine, Ser: 1.45 mg/dL — ABNORMAL HIGH (ref 0.61–1.24)
GFR, Estimated: 49 mL/min — ABNORMAL LOW (ref >60)
Glucose, Bld: 144 mg/dL — ABNORMAL HIGH (ref 70–99)
Potassium: 3.9 mmol/L (ref 3.5–5.1)
Sodium: 133 mmol/L — ABNORMAL LOW (ref 135–145)

## 2020-09-24 LAB — COOXEMETRY PANEL
Carboxyhemoglobin: 1.3 % (ref 0.5–1.5)
Methemoglobin: 0.9 % (ref 0.0–1.5)
O2 Saturation: 66.8 %
Total hemoglobin: 14.6 g/dL (ref 12.0–16.0)

## 2020-09-24 LAB — TYPE AND SCREEN
ABO/RH(D): O POS
Antibody Screen: NEGATIVE

## 2020-09-24 LAB — PROTIME-INR
INR: 1.2 (ref 0.8–1.2)
Prothrombin Time: 14.8 seconds (ref 11.4–15.2)

## 2020-09-24 MED ORDER — TEMAZEPAM 15 MG PO CAPS
15.0000 mg | ORAL_CAPSULE | Freq: Once | ORAL | Status: DC | PRN
Start: 2020-09-24 — End: 2020-09-25
  Filled 2020-09-24: qty 1

## 2020-09-24 MED ORDER — DEXMEDETOMIDINE HCL IN NACL 400 MCG/100ML IV SOLN
0.1000 ug/kg/h | INTRAVENOUS | Status: AC
  Administered 2020-09-25: 1 ug/kg/h via INTRAVENOUS
  Administered 2020-09-25: 53.92 ug via INTRAVENOUS
  Filled 2020-09-24: qty 100

## 2020-09-24 MED ORDER — CHLORHEXIDINE GLUCONATE 4 % EX LIQD
1.0000 "application " | Freq: Once | CUTANEOUS | Status: AC
  Administered 2020-09-25: 1 via TOPICAL
  Filled 2020-09-24: qty 15

## 2020-09-24 MED ORDER — ENOXAPARIN SODIUM 30 MG/0.3ML ~~LOC~~ SOLN: 30.0000 mg | Freq: Every day | SUBCUTANEOUS | Status: DC

## 2020-09-24 MED ORDER — POTASSIUM CHLORIDE 2 MEQ/ML IV SOLN
80.0000 meq | INTRAVENOUS | Status: DC
  Filled 2020-09-24: qty 40

## 2020-09-24 MED ORDER — SODIUM CHLORIDE 0.9 % IV SOLN
1.5000 g | INTRAVENOUS | Status: AC
  Administered 2020-09-25: 1.5 g via INTRAVENOUS
  Filled 2020-09-24: qty 1.5

## 2020-09-24 MED ORDER — VANCOMYCIN HCL 1500 MG/300ML IV SOLN
1500.0000 mg | INTRAVENOUS | Status: AC
  Administered 2020-09-25: 1500 mg via INTRAVENOUS
  Filled 2020-09-24: qty 300

## 2020-09-24 MED ORDER — MAGNESIUM SULFATE 50 % IJ SOLN
40.0000 meq | INTRAMUSCULAR | Status: DC
  Filled 2020-09-24: qty 9.85

## 2020-09-24 MED ORDER — BISACODYL 5 MG PO TBEC
5.0000 mg | DELAYED_RELEASE_TABLET | Freq: Once | ORAL | Status: AC
  Administered 2020-09-24: 5 mg via ORAL
  Filled 2020-09-24: qty 1

## 2020-09-24 MED ORDER — TORSEMIDE 20 MG PO TABS
40.0000 mg | ORAL_TABLET | Freq: Every day | ORAL | Status: DC
  Administered 2020-09-26: 40 mg via ORAL
  Filled 2020-09-24: qty 2

## 2020-09-24 MED ORDER — CHLORHEXIDINE GLUCONATE 0.12 % MT SOLN
15.0000 mL | Freq: Once | OROMUCOSAL | Status: AC
  Administered 2020-09-25: 15 mL via OROMUCOSAL
  Filled 2020-09-24: qty 15

## 2020-09-24 MED ORDER — ENOXAPARIN SODIUM 30 MG/0.3ML ~~LOC~~ SOLN: 30.0000 mg | Freq: Once | SUBCUTANEOUS | Status: DC

## 2020-09-24 MED ORDER — NOREPINEPHRINE 4 MG/250ML-% IV SOLN
0.0000 ug/min | INTRAVENOUS | Status: AC
  Administered 2020-09-25: 1 ug/min via INTRAVENOUS
  Filled 2020-09-24: qty 250

## 2020-09-24 MED ORDER — SODIUM CHLORIDE 0.9 % IV SOLN
INTRAVENOUS | Status: DC
  Filled 2020-09-24: qty 30

## 2020-09-24 NOTE — Plan of Care (Signed)
  Problem: Education: Goal: Knowledge of General Education information will improve Description: Including pain rating scale, medication(s)/side effects and non-pharmacologic comfort measures Outcome: Progressing   Problem: Clinical Measurements: Goal: Ability to maintain clinical measurements within normal limits will improve Outcome: Progressing Goal: Will remain free from infection Outcome: Progressing Goal: Diagnostic test results will improve Outcome: Progressing Goal: Respiratory complications will improve Outcome: Progressing   Problem: Nutrition: Goal: Adequate nutrition will be maintained Outcome: Progressing

## 2020-09-24 NOTE — Progress Notes (Signed)
Progress Note  Patient Name: Brett Williamson Date of Encounter: 09/24/2020  [facility] Cardiologist: No primary care provider on file.   Subjective   Doing well this morning. No chest pain or shortness of breath at rest.   Inpatient Medications    Scheduled Meds: . aspirin EC  81 mg Oral Daily  . bisacodyl  5 mg Oral Once  . [START ON 09/25/2020] chlorhexidine  1 application Topical Once  . [START ON 09/25/2020] chlorhexidine  15 mL Mouth/Throat Once  . Chlorhexidine Gluconate Cloth  6 each Topical Daily  . [START ON 09/26/2020] enoxaparin (LOVENOX) injection  30 mg Subcutaneous Daily  . insulin aspart  0-15 Units Subcutaneous TID WC  . insulin aspart  0-5 Units Subcutaneous QHS  . mouth rinse  15 mL Mouth Rinse BID  . nystatin   Topical TID  . rosuvastatin  20 mg Oral Daily  . sodium chloride flush  10-40 mL Intracatheter Q12H  . sodium chloride flush  3 mL Intravenous Q12H  . [START ON 09/26/2020] torsemide  40 mg Oral Daily   Continuous Infusions: . sodium chloride 10 mL/hr at 09/15/20 0400  . sodium chloride    . cefTRIAXone (ROCEPHIN)  IV Stopped (09/23/20 1557)  . DOBUTamine 2.5 mcg/kg/min (09/24/20 0400)   PRN Meds: sodium chloride, sodium chloride, acetaminophen, Gerhardt's butt cream, guaiFENesin-dextromethorphan, ondansetron (ZOFRAN) IV, sodium chloride flush, sodium chloride flush, temazepam   Vital Signs    Vitals:   09/23/20 2343 09/24/20 0408 09/24/20 0800 09/24/20 1136  BP: 115/76 123/71 117/70 126/85  Pulse: 79 72 77 97  Resp: 17 20 (!) 24 19  Temp: 98 F (36.7 C) 97.9 F (36.6 C) 97.8 F (36.6 C) 98 F (36.7 C)  TempSrc: Oral Oral Oral Oral  SpO2: 90% 99% 92% 94%  Weight:  107.8 kg    Height:        Intake/Output Summary (Last 24 hours) at 09/24/2020 1141 Last data filed at 09/24/2020 1058 Gross per 24 hour  Intake 801.83 ml  Output 3450 ml  Net -2648.17 ml   Last 3 Weights 09/24/2020 09/23/2020 09/22/2020  Weight (lbs) 237 lb 10.5 oz 241 lb  13.5 oz 240 lb 11.9 oz  Weight (kg) 107.8 kg 109.7 kg 109.2 kg      Telemetry    Sinus rhythm, single PVC's - Personally Reviewed   Physical Exam  Alert, oriented, elderly male in NAD, on RA GEN: No acute distress.   Neck: No JVD Cardiac: RRR, 3/6 harsh late peaking systolic murmur at the RUSB Respiratory: Clear to auscultation bilaterally. GI: Soft, nontender, non-distended  MS: No edema; No deformity. Groin areas have improved significantly with treatment of yeast infection. Neuro:  Nonfocal  Psych: Normal affect   Labs    High Sensitivity Troponin:   Recent Labs  Lab 09/11/20 1750 09/11/20 1759 09/12/20 0027 09/12/20 0708 09/12/20 1214  TROPONINIHS 211* 207* 231* 248* 252*      Chemistry Recent Labs  Lab 09/20/20 0645 09/21/20 0430 09/22/20 0251 09/23/20 1210 09/24/20 0529  NA 132* 132* 134*  --  133*  K 4.1 4.0 4.3  --  3.9  CL 94* 96* 99  --  98  CO2 27 26 25   --  25  GLUCOSE 126* 143* 123*  --  144*  BUN 41* 34* 35*  --  37*  CREATININE 1.71* 1.38* 1.42* 1.43* 1.45*  CALCIUM 8.6* 8.5* 8.9  --  8.5*  PROT 7.3 6.8 7.0  --   --  ALBUMIN 2.9* 2.7* 2.9*  --   --   AST 27 26 31   --   --   ALT 27 28 33  --   --   ALKPHOS 71 59 69  --   --   BILITOT 1.7* 1.4* 1.3*  --   --   GFRNONAA 40* 52* 50* 50* 49*  ANIONGAP 11 10 10   --  10     Hematology Recent Labs  Lab 09/19/20 0429 09/20/20 0645 09/23/20 1210  WBC 8.4 9.0 8.2  RBC 5.28 5.70 5.43  HGB 13.1 13.7 13.4  HCT 41.1 44.9 42.6  MCV 77.8* 78.8* 78.5*  MCH 24.8* 24.0* 24.7*  MCHC 31.9 30.5 31.5  RDW 14.8 15.1 15.8*  PLT 197 219 217    BNPNo results for input(s): BNP, PROBNP in the last 168 hours.   DDimer No results for input(s): DDIMER in the last 168 hours.   Radiology    No results found.   Patient Profile     80 y.o. male with a hx of chronic diastolic CHF, severe AS (by echo in 06/2020), subdural hematoma in 06/2020 secondary to mechanical fall (s/p craniotomy hematoma  evacuation), HTN and Type 2 DMwhowas [facility]to [facility]with acute CHF and found to have newly reduced EF 35-40%, RV dysfunction and critical AS. He was transferred to [facility] for further cardiac work up and evaluation by the structural heart team.  Assessment & Plan    Critical aortic stenosis, NYHA functional class IV acute on chronic systolic heart failure. Clinically doing very well on low dose dobutamine and has diuresed well. I have discussed his case with Dr Roxy Manns and reviewed his full consultation from Friday. I have reviewed all of the patient's preoperative imaging studies and agree that he is an appropriate candidate for transfemoral TAVR tomorrow as planned. The right iliac artery is tortuous but amenable to access for TAVR. The patient will likely require balloon valvuloplasty because of the critical nature of his AS. We plan to treat him with a 29 mm Edwards S3 valve. Risks, indications, and alternatives have been reviewed. Informed consent for surgery is obtained.   For questions or updates, please contact [place] Please consult [email or web address] for contact info under        Signed, Sherren Mocha, MD  09/24/2020, 11:41 AM

## 2020-09-24 NOTE — Progress Notes (Signed)
Patient ID: Brett Williamson, male   DOB: 04-Mar-1941, 80 y.o.   MRN: [id number]     Advanced Heart Failure Rounding Note  PCP-Cardiologist: No primary care provider on file.   Subjective:    CVP remains 7 off diuretics, Coox 67% .  He is feeling well, breathing comfortably ready for his procedure.   Objective:   Weight Range: 107.8 kg Body mass index is 33.15 kg/m.   Vital Signs:   Temp:  [97.9 F (36.6 C)-98.7 F (37.1 C)] 97.9 F (36.6 C) (02/14 0408) Pulse Rate:  [71-99] 72 (02/14 0408) Resp:  [17-24] 20 (02/14 0408) BP: (108-124)/(66-80) 123/71 (02/14 0408) SpO2:  [90 %-99 %] 99 % (02/14 0408) Weight:  [107.8 kg] 107.8 kg (02/14 0408) Last BM Date: 09/23/20  Weight change: Filed Weights   09/22/20 0601 09/23/20 0402 09/24/20 0408  Weight: 109.2 kg 109.7 kg 107.8 kg    Intake/Output:   Intake/Output Summary (Last 24 hours) at 09/24/2020 0747 Last data filed at 09/24/2020 0400 Gross per 24 hour  Intake 381.83 ml  Output 2450 ml  Net -2068.17 ml      Physical Exam    Cardiac: JVD 7, normal rate and rhythm, clear s1, high pitched AS murmur, no rubs or gallops, no LE edema Pulmonary: CTAB, not in distress Abdominal: non distended abdomen, soft and nontender Psych: Alert, conversant, in good spirits     Telemetry   NSR 80s + PVCs. Personally reviewed   Labs    CBC Recent Labs    09/23/20 1210  WBC 8.2  HGB 13.4  HCT 42.6  MCV 78.5*  PLT 294   Basic Metabolic Panel Recent Labs    09/22/20 0251 09/23/20 1210 09/24/20 0529  NA 134*  --  133*  K 4.3  --  3.9  CL 99  --  98  CO2 25  --  25  GLUCOSE 123*  --  144*  BUN 35*  --  37*  CREATININE 1.42* 1.43* 1.45*  CALCIUM 8.9  --  8.5*   Liver Function Tests Recent Labs    09/22/20 0251  AST 31  ALT 33  ALKPHOS 69  BILITOT 1.3*  PROT 7.0  ALBUMIN 2.9*   No results for input(s): LIPASE, AMYLASE in the last 72 hours. Cardiac Enzymes No results for input(s): CKTOTAL, CKMB, CKMBINDEX,  TROPONINI in the last 72 hours.  BNP: BNP (last 3 results) Recent Labs    09/11/20 1600 09/12/20 0708  BNP 1,413.0* 1,561.0*    ProBNP (last 3 results) No results for input(s): PROBNP in the last 8760 hours.   D-Dimer No results for input(s): DDIMER in the last 72 hours. Hemoglobin A1C No results for input(s): HGBA1C in the last 72 hours. Fasting Lipid Panel No results for input(s): CHOL, HDL, LDLCALC, TRIG, CHOLHDL, LDLDIRECT in the last 72 hours. Thyroid Function Tests No results for input(s): TSH, T4TOTAL, T3FREE, THYROIDAB in the last 72 hours.  Invalid input(s): FREET3  Other results:  R/LHC results 09/18/20:   1. Diffuse mild non-obstructive CAD 2. Critical calcific aortic stenosis 3. Markedly elevated filling pressure with normal cardiac output on dobutamine 4. 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   Medications:     Scheduled Medications: . aspirin EC  81 mg Oral Daily  . Chlorhexidine Gluconate Cloth  6 each Topical Daily  . enoxaparin (LOVENOX) injection  30 mg Subcutaneous Daily  . insulin aspart  0-15 Units Subcutaneous TID WC  .  insulin aspart  0-5 Units Subcutaneous QHS  . mouth rinse  15 mL Mouth Rinse BID  . nystatin   Topical TID  . rosuvastatin  20 mg Oral Daily  . sodium chloride flush  10-40 mL Intracatheter Q12H  . sodium chloride flush  3 mL Intravenous Q12H  . torsemide  40 mg Oral Daily    Infusions: . sodium chloride 10 mL/hr at 09/15/20 0400  . sodium chloride    . cefTRIAXone (ROCEPHIN)  IV Stopped (09/23/20 1557)  . DOBUTamine 2.5 mcg/kg/min (09/24/20 0400)    PRN Medications: sodium chloride, sodium chloride, acetaminophen, Gerhardt's butt cream, guaiFENesin-dextromethorphan, ondansetron (ZOFRAN) IV, sodium chloride flush, sodium chloride flush    Assessment/Plan   1. Acute on chronic combined systolic and diastolic Biventricular CHF:  ECHO 06/2020 with normal EF, severe aortic stenosis   AVA 0.57 cm2 and mean gradient of 51.68mmHg G2DD.  ECHO 09/12/20 with EF down to 35-40%, worsening of AVA to 0.24 cm2 and mean gradient now 65.7 mmHg, new RWMA, new severely reduced RV function.  Likely due to worsening AS He has diffuse non obstructive CAD by LHC on 09/18/20.     - co-ox 67% on DBA 2.5  Will continue - good output with torsemide 40mg  and dobutamine, continue today will hold tomorrow for procedure - no b-blocker with low output - holding off on ARNI, spiro with AKI and high-grade AS  2. Severe Aortic Stenosis:  Critical stenosis, significantly progressed since 06/2020. By echo, worsening of AVA to 0.24 cm2 and mean gradient now 65.7 mmHg - R/LHC done on 2/8,  CT scans complete - Planning for TAVR on Tuesday - need to get foley out  3. AKI on CKD 3b: Baseline cr around 1.6. peaked at 2.36  Currently staying around baseline - Creatinine now stable at 1.4. Daily BMP  - Continue dobutamine.  - continue torsemide   4. 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 Hx of Traumatic subdural hematoma: 11/21 with craniotomy/evacuation.  He is on SCDs for now for DVT prophylaxis.   - continue low dose lovenox  6. Renal mass on CT/Irregular liver surface - MRI complete. 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 Iron deficiency - Received Feraheme 2/12  8. Deconditioning - PT/OT  Length of Stay: [place], MD  09/24/2020, 7:47 AM  Advanced Heart Failure Team Pager [phone] (M-F; 7a - 4p)  Please contact [facility] for night-coverage after hours (4p -7a ) and weekends on [email or web address]

## 2020-09-24 NOTE — Progress Notes (Signed)
Patient ID: Brett Williamson, male   DOB: 04/14/1941, 80 y.o.   MRN: [id number]     Advanced Heart Failure Rounding Note  PCP-Cardiologist: No primary care provider on file.   Subjective:    CVP remains 7 off diuretics, Coox 67% .  He is feeling well, breathing comfortably ready for his procedure.   Objective:   Weight Range: 107.8 kg Body mass index is 33.15 kg/m.   Vital Signs:   Temp:  [97.8 F (36.6 C)-98.5 F (36.9 C)] 98 F (36.7 C) (02/14 1136) Pulse Rate:  [71-99] 97 (02/14 1136) Resp:  [17-24] 19 (02/14 1136) BP: (108-126)/(66-85) 126/85 (02/14 1136) SpO2:  [90 %-99 %] 94 % (02/14 1136) Weight:  [107.8 kg] 107.8 kg (02/14 0408) Last BM Date: 09/23/20  Weight change: Filed Weights   09/22/20 0601 09/23/20 0402 09/24/20 0408  Weight: 109.2 kg 109.7 kg 107.8 kg    Intake/Output:   Intake/Output Summary (Last 24 hours) at 09/24/2020 1303 Last data filed at 09/24/2020 1058 Gross per 24 hour  Intake 801.83 ml  Output 2450 ml  Net -1648.17 ml      Physical Exam    Cardiac: JVD 7, normal rate and rhythm, clear s1, high pitched AS murmur, no rubs or gallops, no LE edema Pulmonary: CTAB, not in distress Abdominal: non distended abdomen, soft and nontender Psych: Alert, conversant, in good spirits  Telemetry   NSR 80s + PVCs. Personally reviewed   Labs    CBC Recent Labs    09/23/20 1210  WBC 8.2  HGB 13.4  HCT 42.6  MCV 78.5*  PLT 654   Basic Metabolic Panel Recent Labs    09/22/20 0251 09/23/20 1210 09/24/20 0529  NA 134*  --  133*  K 4.3  --  3.9  CL 99  --  98  CO2 25  --  25  GLUCOSE 123*  --  144*  BUN 35*  --  37*  CREATININE 1.42* 1.43* 1.45*  CALCIUM 8.9  --  8.5*   Liver Function Tests Recent Labs    09/22/20 0251  AST 31  ALT 33  ALKPHOS 69  BILITOT 1.3*  PROT 7.0  ALBUMIN 2.9*   No results for input(s): LIPASE, AMYLASE in the last 72 hours. Cardiac Enzymes No results for input(s): CKTOTAL, CKMB, CKMBINDEX, TROPONINI  in the last 72 hours.  BNP: BNP (last 3 results) Recent Labs    09/11/20 1600 09/12/20 0708  BNP 1,413.0* 1,561.0*    ProBNP (last 3 results) No results for input(s): PROBNP in the last 8760 hours.   D-Dimer No results for input(s): DDIMER in the last 72 hours. Hemoglobin A1C No results for input(s): HGBA1C in the last 72 hours. Fasting Lipid Panel No results for input(s): CHOL, HDL, LDLCALC, TRIG, CHOLHDL, LDLDIRECT in the last 72 hours. Thyroid Function Tests No results for input(s): TSH, T4TOTAL, T3FREE, THYROIDAB in the last 72 hours.  Invalid input(s): FREET3  Other results:  R/LHC results 09/18/20:   1. Diffuse mild non-obstructive CAD 2. Critical calcific aortic stenosis 3. Markedly elevated filling pressure with normal cardiac output on dobutamine 4. 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   Medications:     Scheduled Medications: . aspirin EC  81 mg Oral Daily  . bisacodyl  5 mg Oral Once  . [START ON 09/25/2020] chlorhexidine  1 application Topical Once  . [START ON 09/25/2020] chlorhexidine  15 mL Mouth/Throat Once  . Chlorhexidine Gluconate  Cloth  6 each Topical Daily  . [START ON 09/26/2020] enoxaparin (LOVENOX) injection  30 mg Subcutaneous Daily  . insulin aspart  0-15 Units Subcutaneous TID WC  . insulin aspart  0-5 Units Subcutaneous QHS  . mouth rinse  15 mL Mouth Rinse BID  . nystatin   Topical TID  . rosuvastatin  20 mg Oral Daily  . sodium chloride flush  10-40 mL Intracatheter Q12H  . sodium chloride flush  3 mL Intravenous Q12H  . [START ON 09/26/2020] torsemide  40 mg Oral Daily    Infusions: . sodium chloride 10 mL/hr at 09/15/20 0400  . sodium chloride    . cefTRIAXone (ROCEPHIN)  IV Stopped (09/23/20 1557)  . DOBUTamine 2.5 mcg/kg/min (09/24/20 0400)    PRN Medications: sodium chloride, sodium chloride, acetaminophen, Gerhardt's butt cream, guaiFENesin-dextromethorphan, ondansetron (ZOFRAN) IV,  sodium chloride flush, sodium chloride flush, temazepam    Assessment/Plan   1. 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  ECHO 09/12/20 with EF down to 35-40%, worsening of AVA to 0.24 cm2 and mean gradient now 65.7 mmHg, new RWMA, new severely reduced RV function.  Likely due to worsening AS He has diffuse non obstructive CAD by LHC on 09/18/20.     - co-ox 67% on DBA 2.5  Will continue - good output with torsemide 40mg  and dobutamine, continue today will hold tomorrow for procedure - no b-blocker with low output - holding off on ARNI, spiro with AKI and high-grade AS  2. Severe Aortic Stenosis:  Critical stenosis, significantly progressed since 06/2020. By echo, worsening of AVA to 0.24 cm2 and mean gradient now 65.7 mmHg - R/LHC done on 2/8,  CT scans complete - Planning for TAVR on Tuesday - need to get foley out  3. AKI on CKD 3b: Baseline cr around 1.6. peaked at 2.36  Currently staying around baseline - Creatinine now stable at 1.4. Daily BMP  - Continue dobutamine.  - continue torsemide   4. 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 Hx of Traumatic subdural hematoma: 11/21 with craniotomy/evacuation.  He is on SCDs for now for DVT prophylaxis.   - continue low dose lovenox  6. Renal mass on CT/Irregular liver surface - MRI complete. 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 Iron deficiency - Received Feraheme 2/12  8. Deconditioning - PT/OT  Length of Stay: 13  Glori Bickers, MD  09/24/2020, 1:03 PM  Advanced Heart Failure Team Pager [phone] (M-F; 7a - 4p)  Please contact [facility] for night-coverage after hours (4p -7a ) and weekends on [email or web address]  Patient seen and examined with the above-signed Advanced  Practice Provider and/or Housestaff. I personally reviewed laboratory data, imaging studies and relevant notes. I independently examined the patient and formulated the important aspects of the plan. I have edited the note to reflect any of my changes or salient points. I have personally discussed the plan with the patient and/or family.  Remains on dobutamine. Co-ox and volume status stable. On ceftriaxone for mild cellulitis. Denies CP, orthopnea or PND.  General:  Lying in bed No resp difficulty HEENT: normal Neck: supple. no JVD. Carotids 2+ bilat; + bruits. No lymphadenopathy or thryomegaly appreciated. Cor: PMI nondisplaced. Regular rate & rhythm. 3/6 AS Lungs:  clear Abdomen: soft, nontender, nondistended. No hepatosplenomegaly. No bruits or masses. Good bowel sounds. Extremities: no cyanosis, clubbing, rash, edema Neuro: alert & orientedx3, cranial nerves grossly intact. moves all 4 extremities w/o difficulty. Affect pleasant  He is optimized on DBA. Plan for TAVR tomorrow. D/w structural team.   Glori Bickers, MD  1:05 PM

## 2020-09-24 NOTE — Progress Notes (Signed)
Physical Therapy Treatment Patient Details Name: Brett Williamson MRN: [id number] DOB: 05-17-1941 Today's Date: 09/24/2020    History of Present Illness 80 yo male with who presented to the ER with worsening shortness of breath especially with short distances of ambulating. PMH diastolic CHF, JOI7O, DMII, recent SDH (s/p right craniotomy 06/19/20), deconditioning (d/c from rehab on 07/05/20), HTN, TAVR scheduled 2/15    PT Comments    Pt limited by fatigue during session. Pt sat EOB but was unable to stand and ambulate due to fatigue. Pt requested to return supine in bed. Pt demonstrating deficits in balance, strength, coordination, endurance, gait and safety and will benefit from skilled PT to address deficits    Follow Up Recommendations  Other (comment) (will assess after valve replacement surgery, HHPT if pt goes home)     Equipment Recommendations  Other (comment) (TBD after surgery)    Recommendations for Other Services     09/24/2020 PT TAVR Pre-Assessment  HPI: 80 yo male with who presented to the ER with worsening shortness of breath especially with short distances of ambulating. PMH diastolic CHF, MVE7M, DMII, recent SDH (s/p right craniotomy 06/19/20), deconditioning (d/c from rehab on 07/05/20), HTN, TAVR scheduled 2/15(copied from Eval)  Clinical Impression Statement: Pt is a 80 y.o.male being assessed for pre-TAVR.  Pt reports symptoms of SOB with activity.  Pt has deficits strength, WFL ROM, and deficits balance.  Pt ambulated 0 ft during the 6 minute walk test requiring 0 rest breaks with max HR of 103, lowest O2 sat 90, BP 109/79 during mobility.  5 meter walk test produced an average gait speed of 0 which indicates high fall risk.  RPE was 8/10 (following scooting up in bed) and dyspnea was 5/10 during mobility.  Pt was limited by fatigue.  Pt's frailty rating was 4 which is considered vulnerable.  Pt would benefit from continued PT in the acute care setting due to the above  listed deficits in balance, strength, ROM, endurance and activity tolerance.    General UE/LE Strength and ROM:  Strength (0-5/5) ROM (limited/full)  R UE 3+ full  L UE 3+ full  R LE 4- full  L LE 4- full    6 Minute Walk Test:   Total Distance Walked:0 ft.    Did the pt need a rest break? No If yes, why? Pain:No; Fatigue:Yes; Dyspnea/O2 saturations: Yes Comments: pt unable to perform ambulation due to fatigue and SOB   Pre-Test Post-Test  BP 109/70   HR 103   O2 saturations (indicated RA or L/min [place]) 92   Modified Borg Dyspnea Scale (0 none-10 maximal) 0   RPE (6 very light-10 very hard) 6   Comments: following scooting up in bed BH 129/95; HR 110 O2 96%, modified borg 8/10 and RPE 8/10  5 Meter Walk Test:  Trial 1 0 seconds  Trial 2 0 seconds  Trial 3 0 seconds  3 Trial Average/Gait Speed 0 seconds/0 ft/sec (<1.8 ft/sec indicates high fall risk)  Comments: unable to complete Clinical Frailty Scale (1 very fit - 9 terminally ill): 4 (>/= 3/9 is considered frail  Brett Williamson, DPT Acute Rehabilitation Services [phone]              Precautions / Restrictions Precautions Precautions: Fall Restrictions Weight Bearing Restrictions: No    Mobility  Bed Mobility Overal bed mobility: Needs Assistance Bed Mobility: Supine to Sit;Sit to Supine     Supine to sit: Min guard Sit to supine: Min guard  General bed mobility comments: performed scooting up in bed while sitting EOB, S needed and increased time    Transfers                    Ambulation/Gait                 Stairs             Wheelchair Mobility    Modified Rankin (Stroke Patients Only)       Balance                                            Cognition                                              Exercises      General Comments        Pertinent Vitals/Pain Pain Assessment: No/denies pain Faces Pain Scale: Hurts  a little bit Pain Location: back    Home Living                      Prior Function            PT Goals (current goals can now be found in the care plan section) Acute Rehab PT Goals Patient Stated Goal: to have his valves replaced and get stronger PT Goal Formulation: With patient Time For Goal Achievement: 10/05/20 Potential to Achieve Goals: Good Progress towards PT goals: Not progressing toward goals - comment (limited by fatigue)    Frequency    Min 3X/week      PT Plan Current plan remains appropriate    Williamson-evaluation              AM-PAC PT "6 Clicks" Mobility   Outcome Measure  Help needed turning from your back to your side while in a flat bed without using bedrails?: None Help needed moving from lying on your back to sitting on the side of a flat bed without using bedrails?: A Little Help needed moving to and from a bed to a chair (including a wheelchair)?: A Lot Help needed standing up from a chair using your arms (e.g., wheelchair or bedside chair)?: A Lot Help needed to walk in hospital room?: A Lot Help needed climbing 3-5 steps with a railing? : A Lot 6 Click Score: 15    End of Session   Activity Tolerance: Patient limited by fatigue Patient left: in bed;with nursing/sitter in room;with call bell/phone within reach Nurse Communication: Mobility status PT Visit Diagnosis: Muscle weakness (generalized) (M62.81)     Time: 5053-9767 PT Time Calculation (min) (ACUTE ONLY): 17 min  Charges:  $Therapeutic Activity: 8-22 mins                     Brett Williamson, DPT Acute Rehabilitation Services [phone]   [place] 09/24/2020, 1:44 PM

## 2020-09-24 NOTE — H&P (View-Only) (Signed)
Progress Note  Patient Name: Brett Williamson Date of Encounter: 09/24/2020  [facility] Cardiologist: No primary care provider on file.   Subjective   Doing well this morning. No chest pain or shortness of breath at rest.   Inpatient Medications    Scheduled Meds: . aspirin EC  81 mg Oral Daily  . bisacodyl  5 mg Oral Once  . [START ON 09/25/2020] chlorhexidine  1 application Topical Once  . [START ON 09/25/2020] chlorhexidine  15 mL Mouth/Throat Once  . Chlorhexidine Gluconate Cloth  6 each Topical Daily  . [START ON 09/26/2020] enoxaparin (LOVENOX) injection  30 mg Subcutaneous Daily  . insulin aspart  0-15 Units Subcutaneous TID WC  . insulin aspart  0-5 Units Subcutaneous QHS  . mouth rinse  15 mL Mouth Rinse BID  . nystatin   Topical TID  . rosuvastatin  20 mg Oral Daily  . sodium chloride flush  10-40 mL Intracatheter Q12H  . sodium chloride flush  3 mL Intravenous Q12H  . [START ON 09/26/2020] torsemide  40 mg Oral Daily   Continuous Infusions: . sodium chloride 10 mL/hr at 09/15/20 0400  . sodium chloride    . cefTRIAXone (ROCEPHIN)  IV Stopped (09/23/20 1557)  . DOBUTamine 2.5 mcg/kg/min (09/24/20 0400)   PRN Meds: sodium chloride, sodium chloride, acetaminophen, Gerhardt's butt cream, guaiFENesin-dextromethorphan, ondansetron (ZOFRAN) IV, sodium chloride flush, sodium chloride flush, temazepam   Vital Signs    Vitals:   09/23/20 2343 09/24/20 0408 09/24/20 0800 09/24/20 1136  BP: 115/76 123/71 117/70 126/85  Pulse: 79 72 77 97  Resp: 17 20 (!) 24 19  Temp: 98 F (36.7 C) 97.9 F (36.6 C) 97.8 F (36.6 C) 98 F (36.7 C)  TempSrc: Oral Oral Oral Oral  SpO2: 90% 99% 92% 94%  Weight:  107.8 kg    Height:        Intake/Output Summary (Last 24 hours) at 09/24/2020 1141 Last data filed at 09/24/2020 1058 Gross per 24 hour  Intake 801.83 ml  Output 3450 ml  Net -2648.17 ml   Last 3 Weights 09/24/2020 09/23/2020 09/22/2020  Weight (lbs) 237 lb 10.5 oz 241 lb  13.5 oz 240 lb 11.9 oz  Weight (kg) 107.8 kg 109.7 kg 109.2 kg      Telemetry    Sinus rhythm, single PVC's - Personally Reviewed   Physical Exam  Alert, oriented, elderly male in NAD, on RA GEN: No acute distress.   Neck: No JVD Cardiac: RRR, 3/6 harsh late peaking systolic murmur at the RUSB Respiratory: Clear to auscultation bilaterally. GI: Soft, nontender, non-distended  MS: No edema; No deformity. Groin areas have improved significantly with treatment of yeast infection. Neuro:  Nonfocal  Psych: Normal affect   Labs    High Sensitivity Troponin:   Recent Labs  Lab 09/11/20 1750 09/11/20 1759 09/12/20 0027 09/12/20 0708 09/12/20 1214  TROPONINIHS 211* 207* 231* 248* 252*      Chemistry Recent Labs  Lab 09/20/20 0645 09/21/20 0430 09/22/20 0251 09/23/20 1210 09/24/20 0529  NA 132* 132* 134*  --  133*  K 4.1 4.0 4.3  --  3.9  CL 94* 96* 99  --  98  CO2 27 26 25   --  25  GLUCOSE 126* 143* 123*  --  144*  BUN 41* 34* 35*  --  37*  CREATININE 1.71* 1.38* 1.42* 1.43* 1.45*  CALCIUM 8.6* 8.5* 8.9  --  8.5*  PROT 7.3 6.8 7.0  --   --  ALBUMIN 2.9* 2.7* 2.9*  --   --   AST 27 26 31   --   --   ALT 27 28 33  --   --   ALKPHOS 71 59 69  --   --   BILITOT 1.7* 1.4* 1.3*  --   --   GFRNONAA 40* 52* 50* 50* 49*  ANIONGAP 11 10 10   --  10     Hematology Recent Labs  Lab 09/19/20 0429 09/20/20 0645 09/23/20 1210  WBC 8.4 9.0 8.2  RBC 5.28 5.70 5.43  HGB 13.1 13.7 13.4  HCT 41.1 44.9 42.6  MCV 77.8* 78.8* 78.5*  MCH 24.8* 24.0* 24.7*  MCHC 31.9 30.5 31.5  RDW 14.8 15.1 15.8*  PLT 197 219 217    BNPNo results for input(s): BNP, PROBNP in the last 168 hours.   DDimer No results for input(s): DDIMER in the last 168 hours.   Radiology    No results found.   Patient Profile     80 y.o. male with a hx of chronic diastolic CHF, severe AS (by echo in 06/2020), subdural hematoma in 06/2020 secondary to mechanical fall (s/p craniotomy hematoma  evacuation), HTN and Type 2 DMwhowas [facility]with acute CHF and found to have newly reduced EF 35-40%, RV dysfunction and critical AS. He was transferred to [facility] for further cardiac work up and evaluation by the structural heart team.  Assessment & Plan    Critical aortic stenosis, NYHA functional class IV acute on chronic systolic heart failure. Clinically doing very well on low dose dobutamine and has diuresed well. I have discussed his case with Dr Roxy Manns and reviewed his full consultation from Friday. I have reviewed all of the patient's preoperative imaging studies and agree that he is an appropriate candidate for transfemoral TAVR tomorrow as planned. The right iliac artery is tortuous but amenable to access for TAVR. The patient will likely require balloon valvuloplasty because of the critical nature of his AS. We plan to treat him with a 29 mm Edwards S3 valve. Risks, indications, and alternatives have been reviewed. Informed consent for surgery is obtained.   For questions or updates, please contact [facility] Please consult [email or web address] for contact info under        Signed, Sherren Mocha, MD  09/24/2020, 11:41 AM

## 2020-09-25 ENCOUNTER — Encounter (HOSPITAL_COMMUNITY): Payer: Self-pay | Admitting: Cardiovascular Disease

## 2020-09-25 ENCOUNTER — Encounter (HOSPITAL_COMMUNITY)
Admission: EM | Disposition: A | Discharge status: Skilled Nursing Facility | Payer: Self-pay | Source: Home / Self Care | Attending: Cardiovascular Disease

## 2020-09-25 ENCOUNTER — Inpatient Hospital Stay (HOSPITAL_COMMUNITY): Payer: Medicare Other

## 2020-09-25 ENCOUNTER — Other Ambulatory Visit: Payer: Self-pay | Admitting: Physician Assistant

## 2020-09-25 DIAGNOSIS — Z952 Presence of prosthetic heart valve: Secondary | ICD-10-CM

## 2020-09-25 DIAGNOSIS — I5043 Acute on chronic combined systolic (congestive) and diastolic (congestive) heart failure: Secondary | ICD-10-CM | POA: Diagnosis not present

## 2020-09-25 DIAGNOSIS — Z006 Encounter for examination for normal comparison and control in clinical research program: Secondary | ICD-10-CM

## 2020-09-25 DIAGNOSIS — I350 Nonrheumatic aortic (valve) stenosis: Secondary | ICD-10-CM

## 2020-09-25 DIAGNOSIS — I130 Hypertensive heart and chronic kidney disease with heart failure and stage 1 through stage 4 chronic kidney disease, or unspecified chronic kidney disease: Secondary | ICD-10-CM | POA: Diagnosis not present

## 2020-09-25 HISTORY — PX: TRANSCATHETER AORTIC VALVE REPLACEMENT, TRANSFEMORAL: SHX6400

## 2020-09-25 HISTORY — DX: Presence of prosthetic heart valve: Z95.2

## 2020-09-25 HISTORY — PX: TEE WITHOUT CARDIOVERSION: SHX5443

## 2020-09-25 LAB — POCT I-STAT, CHEM 8
BUN: 43 mg/dL — ABNORMAL HIGH (ref 8–23)
Calcium, Ion: 1.17 mmol/L (ref 1.15–1.40)
Chloride: 98 mmol/L (ref 98–111)
Creatinine, Ser: 1.6 mg/dL — ABNORMAL HIGH (ref 0.61–1.24)
Glucose, Bld: 169 mg/dL — ABNORMAL HIGH (ref 70–99)
HCT: 45 % (ref 39.0–52.0)
Hemoglobin: 15.3 g/dL (ref 13.0–17.0)
Potassium: 3.9 mmol/L (ref 3.5–5.1)
Sodium: 133 mmol/L — ABNORMAL LOW (ref 135–145)
TCO2: 25 mmol/L (ref 22–32)

## 2020-09-25 LAB — ECHOCARDIOGRAM LIMITED
AR max vel: 1.93 cm2
AV Area VTI: 2.21 cm2
AV Area mean vel: 2.37 cm2
AV Mean grad: 5 mmHg
AV Peak grad: 10 mmHg
Ao pk vel: 1.58 m/s
Height: 71 in
Weight: 3795.44 oz

## 2020-09-25 LAB — GLUCOSE, CAPILLARY
Glucose-Capillary: 115 mg/dL — ABNORMAL HIGH (ref 70–99)
Glucose-Capillary: 115 mg/dL — ABNORMAL HIGH (ref 70–99)
Glucose-Capillary: 120 mg/dL — ABNORMAL HIGH (ref 70–99)
Glucose-Capillary: 206 mg/dL — ABNORMAL HIGH (ref 70–99)
Glucose-Capillary: 151 mg/dL — ABNORMAL HIGH (ref 70–99)

## 2020-09-25 LAB — COOXEMETRY PANEL
Carboxyhemoglobin: 1.3 % (ref 0.5–1.5)
Methemoglobin: 0.8 % (ref 0.0–1.5)
O2 Saturation: 60.6 %
Total hemoglobin: 15 g/dL (ref 12.0–16.0)

## 2020-09-25 LAB — CBC
HCT: 48 % (ref 39.0–52.0)
Hemoglobin: 14.7 g/dL (ref 13.0–17.0)
MCH: 24 pg — ABNORMAL LOW (ref 26.0–34.0)
MCHC: 30.6 g/dL (ref 30.0–36.0)
MCV: 78.4 fL — ABNORMAL LOW (ref 80.0–100.0)
Platelets: 222 10*3/uL (ref 150–400)
RBC: 6.12 MIL/uL — ABNORMAL HIGH (ref 4.22–5.81)
RDW: 16.6 % — ABNORMAL HIGH (ref 11.5–15.5)
WBC: 9.1 10*3/uL (ref 4.0–10.5)
nRBC: 0 % (ref 0.0–0.2)

## 2020-09-25 LAB — BASIC METABOLIC PANEL
Anion gap: 9 (ref 5–15)
BUN: 40 mg/dL — ABNORMAL HIGH (ref 8–23)
CO2: 25 mmol/L (ref 22–32)
Calcium: 8.5 mg/dL — ABNORMAL LOW (ref 8.9–10.3)
Chloride: 98 mmol/L (ref 98–111)
Creatinine, Ser: 1.5 mg/dL — ABNORMAL HIGH (ref 0.61–1.24)
GFR, Estimated: 47 mL/min — ABNORMAL LOW (ref >60)
Glucose, Bld: 128 mg/dL — ABNORMAL HIGH (ref 70–99)
Potassium: 3.8 mmol/L (ref 3.5–5.1)
Sodium: 132 mmol/L — ABNORMAL LOW (ref 135–145)

## 2020-09-25 LAB — PROTIME-INR
INR: 1.1 (ref 0.8–1.2)
Prothrombin Time: 14 seconds (ref 11.4–15.2)

## 2020-09-25 LAB — APTT: aPTT: 32 seconds (ref 24–36)

## 2020-09-25 SURGERY — IMPLANTATION, AORTIC VALVE, TRANSCATHETER, FEMORAL APPROACH
Anesthesia: Monitor Anesthesia Care | Site: Chest

## 2020-09-25 MED ORDER — SODIUM CHLORIDE 0.9 % IV SOLN: 1.5000 g | Freq: Two times a day (BID) | INTRAVENOUS | Status: DC

## 2020-09-25 MED ORDER — VANCOMYCIN HCL IN DEXTROSE 1-5 GM/200ML-% IV SOLN
1000.0000 mg | Freq: Once | INTRAVENOUS | Status: AC
  Administered 2020-09-25: 1000 mg via INTRAVENOUS
  Filled 2020-09-25: qty 200

## 2020-09-25 MED ORDER — FENTANYL CITRATE (PF) 250 MCG/5ML IJ SOLN
INTRAMUSCULAR | Status: DC | PRN
  Administered 2020-09-25: 25 ug via INTRAVENOUS

## 2020-09-25 MED ORDER — SODIUM CHLORIDE 0.9 % IV SOLN: INTRAVENOUS | Status: DC | PRN

## 2020-09-25 MED ORDER — HEPARIN SODIUM (PORCINE) 1000 UNIT/ML IJ SOLN
INTRAMUSCULAR | Status: DC | PRN
  Administered 2020-09-25: 17000 [IU] via INTRAVENOUS

## 2020-09-25 MED ORDER — HEPARIN (PORCINE) IN NACL 1000-0.9 UT/500ML-% IV SOLN
INTRAVENOUS | Status: AC
  Filled 2020-09-25: qty 500

## 2020-09-25 MED ORDER — ONDANSETRON HCL 4 MG/2ML IJ SOLN: 4.0000 mg | Freq: Four times a day (QID) | INTRAMUSCULAR | Status: DC | PRN

## 2020-09-25 MED ORDER — IOHEXOL 350 MG/ML SOLN
INTRAVENOUS | Status: AC
  Filled 2020-09-25: qty 1

## 2020-09-25 MED ORDER — MORPHINE SULFATE (PF) 2 MG/ML IV SOLN: 1.0000 mg | INTRAVENOUS | Status: DC | PRN

## 2020-09-25 MED ORDER — OXYCODONE HCL 5 MG PO TABS
5.0000 mg | ORAL_TABLET | ORAL | Status: DC | PRN
  Administered 2020-09-26: 5 mg via ORAL
  Filled 2020-09-25: qty 1

## 2020-09-25 MED ORDER — TRAMADOL HCL 50 MG PO TABS: 50.0000 mg | ORAL_TABLET | ORAL | Status: DC | PRN

## 2020-09-25 MED ORDER — NITROGLYCERIN IN D5W 200-5 MCG/ML-% IV SOLN: 0.0000 ug/min | INTRAVENOUS | Status: DC

## 2020-09-25 MED ORDER — SODIUM CHLORIDE 0.9 % IV SOLN: INTRAVENOUS | Status: AC

## 2020-09-25 MED ORDER — PROTAMINE SULFATE 10 MG/ML IV SOLN
INTRAVENOUS | Status: DC | PRN
  Administered 2020-09-25: 170 mg via INTRAVENOUS

## 2020-09-25 MED ORDER — SODIUM CHLORIDE 0.9 % IV SOLN: 250.0000 mL | INTRAVENOUS | Status: DC | PRN

## 2020-09-25 MED ORDER — SODIUM CHLORIDE 0.9% FLUSH: 3.0000 mL | INTRAVENOUS | Status: DC | PRN

## 2020-09-25 MED ORDER — IOHEXOL 350 MG/ML SOLN
INTRAVENOUS | Status: DC | PRN
  Administered 2020-09-25: 30 mL via INTRA_ARTERIAL

## 2020-09-25 MED ORDER — PROPOFOL 500 MG/50ML IV EMUL
INTRAVENOUS | Status: DC | PRN
  Administered 2020-09-25: 15 ug/kg/min via INTRAVENOUS

## 2020-09-25 MED ORDER — LIDOCAINE HCL (PF) 1 % IJ SOLN
INTRAMUSCULAR | Status: AC
  Filled 2020-09-25: qty 30

## 2020-09-25 MED ORDER — PHENYLEPHRINE HCL-NACL 20-0.9 MG/250ML-% IV SOLN: 0.0000 ug/min | INTRAVENOUS | Status: DC

## 2020-09-25 MED ORDER — ACETAMINOPHEN 325 MG PO TABS
650.0000 mg | ORAL_TABLET | Freq: Four times a day (QID) | ORAL | Status: DC | PRN
  Administered 2020-09-25 – 2020-09-26 (×2): 650 mg via ORAL
  Filled 2020-09-25 (×2): qty 2

## 2020-09-25 MED ORDER — PHENYLEPHRINE 40 MCG/ML (10ML) SYRINGE FOR IV PUSH (FOR BLOOD PRESSURE SUPPORT)
PREFILLED_SYRINGE | INTRAVENOUS | Status: DC | PRN
  Administered 2020-09-25: 80 ug via INTRAVENOUS

## 2020-09-25 MED ORDER — SODIUM CHLORIDE 0.9% FLUSH
3.0000 mL | Freq: Two times a day (BID) | INTRAVENOUS | Status: DC
  Administered 2020-09-25 – 2020-09-29 (×8): 3 mL via INTRAVENOUS

## 2020-09-25 MED ORDER — ACETAMINOPHEN 650 MG RE SUPP: 650.0000 mg | Freq: Four times a day (QID) | RECTAL | Status: DC | PRN

## 2020-09-25 MED ORDER — MIDAZOLAM HCL 2 MG/2ML IJ SOLN
INTRAMUSCULAR | Status: DC | PRN
  Administered 2020-09-25: 1 mg via INTRAVENOUS

## 2020-09-25 MED ORDER — CHLORHEXIDINE GLUCONATE 0.12 % MT SOLN
OROMUCOSAL | Status: AC
  Filled 2020-09-25: qty 15

## 2020-09-25 MED ORDER — CLOPIDOGREL BISULFATE 75 MG PO TABS
75.0000 mg | ORAL_TABLET | Freq: Every day | ORAL | Status: DC
Start: 2020-09-26 — End: 2020-09-30
  Administered 2020-09-26 – 2020-09-29 (×4): 75 mg via ORAL
  Filled 2020-09-25 (×4): qty 1

## 2020-09-25 MED ORDER — LIDOCAINE HCL 1 % IJ SOLN
INTRAMUSCULAR | Status: DC | PRN
  Administered 2020-09-25 (×2): 15 mL via INTRADERMAL

## 2020-09-25 SURGICAL SUPPLY — 38 items
BAG SNAP BAND KOVER 36X36 (MISCELLANEOUS) ×2 IMPLANT
BLANKET WARM UNDERBOD FULL ACC (MISCELLANEOUS) ×1 IMPLANT
CABLE ADAPT PACING TEMP 12FT (ADAPTER) ×1 IMPLANT
CATH 29 EDWARDS DELIVERY SYS (CATHETERS) ×1 IMPLANT
CATH ANGIO 5F BER2 65CM (CATHETERS) ×1 IMPLANT
CATH DIAG 6FR PIGTAIL ANGLED (CATHETERS) ×2 IMPLANT
CATH INFINITI 6F AL2 (CATHETERS) ×1 IMPLANT
CATH S G BIP PACING (CATHETERS) ×1 IMPLANT
CLOSURE MYNX CONTROL 6F/7F (Vascular Products) ×1 IMPLANT
CLOSURE PERCLOSE PROSTYLE (VASCULAR PRODUCTS) ×2 IMPLANT
COVER DOME SNAP 22 D (MISCELLANEOUS) ×1 IMPLANT
CRIMPER (MISCELLANEOUS) ×1 IMPLANT
DEVICE INFLATION ATRION QL2530 (MISCELLANEOUS) ×1 IMPLANT
DEVICE INFLATION ATRION QL38 (MISCELLANEOUS) ×1 IMPLANT
ELECT DEFIB PAD ADLT CADENCE (PAD) ×1 IMPLANT
GUIDEWIRE SAFE TJ AMPLATZ EXST (WIRE) ×1 IMPLANT
KIT HEART LEFT (KITS) ×1 IMPLANT
KIT MICROPUNCTURE NIT STIFF (SHEATH) ×1 IMPLANT
PACK CARDIAC CATHETERIZATION (CUSTOM PROCEDURE TRAY) ×1 IMPLANT
SHEATH 16X36 EDWARDS (SHEATH) ×1 IMPLANT
SHEATH BRITE TIP 7FR 35CM (SHEATH) ×1 IMPLANT
SHEATH PINNACLE 6F 10CM (SHEATH) ×1 IMPLANT
SHEATH PINNACLE 8F 10CM (SHEATH) ×1 IMPLANT
SHEATH PROBE COVER 6X72 (BAG) ×1 IMPLANT
SHIELD RADPAD SCOOP 12X17 (MISCELLANEOUS) ×1 IMPLANT
SLEEVE REPOSITIONING LENGTH 30 (MISCELLANEOUS) ×1 IMPLANT
STOPCOCK MORSE 400PSI 3WAY (MISCELLANEOUS) ×2 IMPLANT
SYR MEDRAD MARK V 150ML (SYRINGE) ×1 IMPLANT
TRANSDUCER W/MONITORING KIT (MISCELLANEOUS) ×2 IMPLANT
TUBING ART PRESS 72  MALE/FEM (TUBING) ×1
TUBING ART PRESS 72 MALE/FEM (TUBING) IMPLANT
TUBING CIL FLEX 10 FLL-RA (TUBING) ×1 IMPLANT
TUBING CONTRAST HIGH PRESS 48 (TUBING) ×1 IMPLANT
VALVE HEART TRANSCATH SZ3 29MM (Valve) ×1 IMPLANT
WIRE AMPLATZ SS-J .035X180CM (WIRE) ×1 IMPLANT
WIRE EMERALD 3MM-J .035X150CM (WIRE) ×1 IMPLANT
WIRE EMERALD 3MM-J .035X260CM (WIRE) ×1 IMPLANT
WIRE EMERALD ST .035X260CM (WIRE) ×1 IMPLANT

## 2020-09-25 NOTE — Op Note (Signed)
[facility]   MULTIDISCIPLINARY HEART VALVE TEAM   TAVR OPERATIVE NOTE   Date of Procedure:  09/25/2020  Preoperative Diagnosis: Severe Aortic Stenosis   Postoperative Diagnosis: Same   Procedure:    Transcatheter Aortic Valve Replacement - Percutaneous Transfemoral Approach  Edwards Sapien 3 THV (size 29 mm, model # 9600TFX, serial # [id number])   Co-Surgeons:  Valentina Gu. Roxy Manns, MD and Sherren Mocha, MD  Anesthesiologist:  Albertha Ghee, MD  Echocardiographer:  Sanda Klein, MD  Pre-operative Echo Findings:  Critical aortic stenosis  Moderate left ventricular systolic dysfunction  Post-operative Echo Findings:  trace paravalvular leak  unchanged left ventricular systolic function  BRIEF CLINICAL NOTE AND INDICATIONS FOR SURGERY  Please see the complete note of Dr Roxy Manns.  During the course of the patient's preoperative work up they have been evaluated comprehensively by a multidisciplinary team of specialists coordinated through the [facility] in the [facility].  They have been demonstrated to suffer from symptomatic severe aortic stenosis as noted above. The patient has been counseled extensively as to the relative risks and benefits of all options for the treatment of severe aortic stenosis including long term medical therapy, conventional surgery for aortic valve replacement, and transcatheter aortic valve replacement.  The patient has been independently evaluated in formal cardiac surgical consultation by Dr Roxy Manns, who deemed the patient appropriate for TAVR. Based upon review of all of the patient's preoperative diagnostic tests they are felt to be candidate for transcatheter aortic valve replacement using the transfemoral approach as an alternative to conventional surgery.    Following the decision to proceed with transcatheter aortic valve replacement, a discussion has been held regarding what types  of management strategies would be attempted intraoperatively in the event of life-threatening complications, including whether or not the patient would be considered a candidate for the use of cardiopulmonary bypass and/or conversion to open sternotomy for attempted surgical intervention.  The patient has been advised of a variety of complications that might develop peculiar to this approach including but not limited to risks of death, stroke, paravalvular leak, aortic dissection or other major vascular complications, aortic annulus rupture, device embolization, cardiac rupture or perforation, acute myocardial infarction, arrhythmia, heart block or bradycardia requiring permanent pacemaker placement, congestive heart failure, respiratory failure, renal failure, pneumonia, infection, other late complications related to structural valve deterioration or migration, or other complications that might ultimately cause a temporary or permanent loss of functional independence or other long term morbidity.  The patient provides full informed consent for the procedure as described and all questions were answered preoperatively.  DETAILS OF THE OPERATIVE PROCEDURE  PREPARATION:   The patient is brought to the operating room on the above mentioned date and central monitoring was established by the anesthesia team including placement of a central venous catheter and radial arterial line. The patient is placed in the supine position on the operating table.  Intravenous antibiotics are administered. The patient is monitored closely throughout the procedure under conscious sedation.    Baseline transthoracic echocardiogram is performed. The patient's chest, abdomen, both groins, and both lower extremities are prepared and draped in a sterile manner. A time out procedure is performed.   PERIPHERAL ACCESS:   Using ultrasound guidance, femoral arterial and venous access is obtained with placement of 6 Fr sheaths on the left  side.  A pigtail diagnostic catheter was passed through the femoral arterial sheath under fluoroscopic guidance into the aortic root.  A  temporary transvenous pacemaker catheter was passed through the femoral venous sheath under fluoroscopic guidance into the right ventricle.  The pacemaker was tested to ensure stable lead placement and pacemaker capture. Aortic root angiography was performed in order to determine the optimal angiographic angle for valve deployment.  TRANSFEMORAL ACCESS:  A micropuncture technique is used to access the right femoral artery under fluoroscopic and ultrasound guidance.  2 Perclose devices are deployed at 10' and 2' positions to 'PreClose' the femoral artery. An 8 French sheath is placed and then an Amplatz Superstiff wire is advanced through the sheath. This is changed out for a 16 French transfemoral E-Sheath after progressively dilating over the Superstiff wire.  An AL-2 catheter was used to direct a straight-tip exchange length wire across the native aortic valve into the left ventricle. This was exchanged out for a pigtail catheter and position was confirmed in the LV apex. Simultaneous LV and Ao pressures were recorded.  The pigtail catheter was exchanged for an Amplatz Extra-stiff wire in the LV apex.    BALLOON AORTIC VALVULOPLASTY:  Preformed with a 20 mm valvuloplasty balloon under rapid ventricular pacing  TRANSCATHETER HEART VALVE DEPLOYMENT:  An Edwards Sapien 3 transcatheter heart valve (size 29 mm) was prepared and crimped per manufacturer's guidelines, and the proper orientation of the valve is confirmed on the [employer] delivery system. The valve was advanced through the introducer sheath using normal technique until in an appropriate position in the abdominal aorta beyond the sheath tip. The balloon was then retracted and using the fine-tuning wheel was centered on the valve. The valve was then advanced across the aortic arch using appropriate flexion  of the catheter. The valve was carefully positioned across the aortic valve annulus. The Commander catheter was retracted using normal technique. Once final position of the valve has been confirmed by angiographic assessment, the valve is deployed while temporarily holding ventilation and during rapid ventricular pacing to maintain systolic blood pressure < 50 mmHg and pulse pressure < 10 mmHg. The balloon inflation is held for >3 seconds after reaching full deployment volume. Once the balloon has fully deflated the balloon is retracted into the ascending aorta and valve function is assessed using echocardiography. The patient's hemodynamic recovery following valve deployment is good.  The deployment balloon and guidewire are both removed. Echo demostrated acceptable post-procedural gradients, stable mitral valve function, and trace aortic insufficiency.    PROCEDURE COMPLETION:  The sheath was removed and femoral artery closure is performed using the 2 previously deployed Perclose devices.  Protamine is administered once femoral arterial repair was complete. The site is clear with no evidence of bleeding or hematoma after the sutures are tightened. The temporary pacemaker and pigtail catheters are removed. Mynx closure is used for contralateral femoral arterial hemostasis for the 6 Fr sheath.  The patient tolerated the procedure well and is transported to the surgical intensive care in stable condition. There were no immediate intraoperative complications. All sponge instrument and needle counts are verified correct at completion of the operation.   The patient received a total of 30 mL of intravenous contrast during the procedure.   Sherren Mocha, MD 09/25/2020 10:16 PM

## 2020-09-25 NOTE — Interval H&P Note (Signed)
History and Physical Interval Note:  09/25/2020 9:49 AM  Brett Williamson  has presented today for surgery, with the diagnosis of Critical Aortic Stenosis.  The various methods of treatment have been discussed with the patient and family. After consideration of risks, benefits and other options for treatment, the patient has consented to  Procedure(s): TRANSCATHETER AORTIC VALVE REPLACEMENT, TRANSFEMORAL (N/A) TRANSESOPHAGEAL ECHOCARDIOGRAM (TEE) (N/A) as a surgical intervention.  The patient's history has been reviewed, patient examined, no change in status, stable for surgery.  I have reviewed the patient's chart and labs.  Questions were answered to the patient's satisfaction.     Sherren Mocha

## 2020-09-25 NOTE — Progress Notes (Signed)
Pt arrived to 4east from cath lab. Pt oriented to room and staff. Bilateral groin sites level 0. Palpable pedal pulses. Vitals stable and being monitored per protocol. Bedrest x4 hours. Wife at bedside. Will continue current plan of care.

## 2020-09-25 NOTE — Progress Notes (Addendum)
LOCATION: Right RADIAL A-line  DEFLATED PER PROTOCOL: A-line removed, manual pressure held for approximately 10 minutes  DRESSING APPLIED: gauze with tegaderm applied  SITE UPON ARRIVAL: LEVEL 0 SITE AFTER BAND REMOVAL: LEVEL0  CIRCULATION SENSATION AND MOVEMENT: YES,  +2 radial pulse  COMMENTS: soft bump noted above and medial to a-line site, present before a-line removed

## 2020-09-25 NOTE — Progress Notes (Signed)
  [facility] VALVE TEAM  Patient doing well s/p TAVR. He is hemodynamically stable. Now off dobutamine. Cuff pressure < arterial line pressure. Groin sites stable. Holding off on Lovenox for now. ECG with old RBBB and 1st de AV block, PVCs. There is no evidence of high grade block. Plan to recover in cath lab and transfer to 4E for post TAVR recovery. Plan for early ambulation after bedrest completed and hopeful discharge over the next 24-48 hours.   Angelena Form PA-C  [facility]  Pager [phone]

## 2020-09-25 NOTE — Progress Notes (Signed)
Mobility Specialist - Progress Note   09/25/20 1836  Mobility  Activity Refused mobility   Pt refused due to bilateral knee pain, RN in room. Pt expressed understanding of the importance of early mobility and stated he will attempt to ambulate tomorrow.   Pricilla Handler Mobility Specialist Mobility Specialist Phone: [phone]

## 2020-09-25 NOTE — Anesthesia Procedure Notes (Signed)
Procedure Name: MAC Date/Time: 09/25/2020 11:01 AM Performed by: Rande Brunt, CRNA Pre-anesthesia Checklist: Patient identified, Emergency Drugs available, Suction available and Patient being monitored Patient Re-evaluated:Patient Re-evaluated prior to induction Oxygen Delivery Method: Nasal cannula Preoxygenation: Pre-oxygenation with 100% oxygen Induction Type: IV induction Placement Confirmation: positive ETCO2 Dental Injury: Teeth and Oropharynx as per pre-operative assessment

## 2020-09-25 NOTE — Op Note (Signed)
[facility]   MULTIDISCIPLINARY HEART VALVE TEAM   TAVR OPERATIVE NOTE   Date of Procedure:  09/25/2020  Preoperative Diagnosis: Severe Aortic Stenosis   Postoperative Diagnosis: Same   Procedure:    Transcatheter Aortic Valve Replacement - Percutaneous Right Transfemoral Approach  Edwards Sapien 3 THV (size 29 mm, model # 9600TFX, serial # [id number])   Co-Surgeons:  Valentina Gu. Roxy Manns, MD and Sherren Mocha, MD  Anesthesiologist:  Albertha Ghee, MD  Echocardiographer:  Sanda Klein, MD  Pre-operative Echo Findings:  Severe aortic stenosis  Moderate left ventricular systolic dysfunction  Post-operative Echo Findings:  Trivial paravalvular leak  Unchanged left ventricular systolic function   BRIEF CLINICAL NOTE AND INDICATIONS FOR SURGERY  Patient is an 80 year old gentleman with aortic stenosis, hypertension, type 2 diabetes mellitus, and chronic right bundle branch block who recently has been recovering from hospitalization following closed head trauma requiring craniotomy for evacuation of hematoma in November 2021 and now was admitted with 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  He has improved remarkably well with aggressive medical management including inotropic support and intravenous diuresis.  During the course of the patient's preoperative work up they have been evaluated comprehensively by a multidisciplinary team of specialists coordinated through the [facility] in the [facility].  They have been demonstrated to suffer from symptomatic severe aortic stenosis as noted above. The patient has been counseled extensively as to the relative risks and benefits of all options for the  treatment of severe aortic stenosis including long term medical therapy, conventional surgery for aortic valve replacement, and transcatheter aortic valve replacement.  All questions have been answered, and the patient provides full informed consent for the operation as described.   DETAILS OF THE OPERATIVE PROCEDURE  PREPARATION:    The patient is brought to the operating room on the above mentioned date and appropriate monitoring was established by the anesthesia team. The patient is placed in the supine position on the operating table.  Intravenous antibiotics are administered. The patient is monitored closely throughout the procedure under conscious sedation.  Baseline transthoracic echocardiogram was performed. The patient's chest, abdomen, both groins, and both lower extremities are prepared and draped in a sterile manner. A time out procedure is performed.   PERIPHERAL ACCESS:    Using the modified Seldinger technique, femoral arterial and venous access was obtained with placement of 6 Fr sheaths on the left side.  A pigtail diagnostic catheter was passed through the leftg arterial sheath under fluoroscopic guidance into the aortic root.  A temporary transvenous pacemaker catheter was passed through the left femoral venous sheath under fluoroscopic guidance into the right ventricle.  The pacemaker was tested to ensure stable lead placement and pacemaker capture. Aortic root angiography was performed in order to determine the optimal angiographic angle for valve deployment.   TRANSFEMORAL ACCESS:   Percutaneous transfemoral access and sheath placement was performed using ultrasound guidance.  The right common femoral artery was cannulated using a micropuncture needle and appropriate location was verified using hand injection angiogram.  A pair of [employer] Perclose percutaneous closure devices were placed and a 6 French sheath replaced into the femoral artery.  The patient was heparinized  systemically and ACT verified > 250 seconds.    A 16 Fr transfemoral E-sheath was introduced  into the right common femoral artery after progressively dilating over an Amplatz superstiff wire. An AL-2 catheter was used to direct a straight-tip exchange length wire across the native aortic valve into the left ventricle. This was exchanged out for a pigtail catheter and position was confirmed in the LV apex. Simultaneous LV and Ao pressures were recorded.  The pigtail catheter was exchanged for an Amplatz Extra-stiff wire in the LV apex.  Echocardiography was utilized to confirm appropriate wire position and no sign of entanglement in the mitral subvalvular apparatus.   BALLOON AORTIC VALVULOPLASTY:   Balloon aortic valvuloplasty was performed using a 20 mm valvuloplasty balloon.  Once optimal position was achieved, BAV was done under rapid ventricular pacing. The patient recovered well hemodynamically.    TRANSCATHETER HEART VALVE DEPLOYMENT:   An Edwards Sapien 3 transcatheter heart valve (size 29 mm, model #9600TFX, serial #[id number]) was prepared and crimped per manufacturer's guidelines, and the proper orientation of the valve is confirmed on the [employer] delivery system. The valve was advanced through the introducer sheath using normal technique until in an appropriate position in the abdominal aorta beyond the sheath tip. The balloon was then retracted and using the fine-tuning wheel was centered on the valve. The valve was then advanced across the aortic arch using appropriate flexion of the catheter. The valve was carefully positioned across the aortic valve annulus. The Commander catheter was retracted using normal technique. Once final position of the valve has been confirmed by angiographic assessment, the valve is deployed while temporarily holding ventilation and during rapid ventricular pacing to maintain systolic blood pressure < 50 mmHg and pulse pressure < 10 mmHg. The balloon  inflation is held for >3 seconds after reaching full deployment volume. Once the balloon has fully deflated the balloon is retracted into the ascending aorta and valve function is assessed using echocardiography. There is felt to be trivial paravalvular leak and no central aortic insufficiency.  The patient's hemodynamic recovery following valve deployment is good.  The deployment balloon and guidewire are both removed.    PROCEDURE COMPLETION:   The sheath was removed and femoral artery closure performed.  Protamine was administered once femoral arterial repair was complete. The temporary pacemaker, pigtail catheters and femoral sheaths were removed with manual pressure used for hemostasis.  A Mynx femoral closure device was utilized following removal of the diagnostic sheath in the left femoral artery.  The patient tolerated the procedure well and is transported to the surgical intensive care in stable condition. There were no immediate intraoperative complications. All sponge instrument and needle counts are verified correct at completion of the operation.   No blood products were administered during the operation.    Rexene Alberts, MD 09/25/2020 1:17 PM

## 2020-09-25 NOTE — Progress Notes (Signed)
Patient ID: Brett Williamson, male   DOB: [birth year], 80 y.o.   MRN: [id number]     Advanced Heart Failure Rounding Note  PCP-Cardiologist: No primary care provider on file.   Subjective:    CVP 6-7, Coox 60% doing well on torsemide 40mg  and dobutamine weight stable good ouptut.  He is feeling well, breathing comfortably ready for his procedure.   Objective:   Weight Range: 107.6 kg Body mass index is 33.08 kg/m.   Vital Signs:   Temp:  [97.8 F (36.6 C)-98 F (36.7 C)] 98 F (36.7 C) (02/15 0411) Pulse Rate:  [72-97] 72 (02/15 0411) Resp:  [15-24] 17 (02/15 0411) BP: (116-126)/(46-85) 116/70 (02/15 0411) SpO2:  [91 %-95 %] 95 % (02/15 0411) Weight:  [107.6 kg] 107.6 kg (02/15 0411) Last BM Date: 09/23/20  Weight change: Filed Weights   09/23/20 0402 09/24/20 0408 09/25/20 0411  Weight: 109.7 kg 107.8 kg 107.6 kg    Intake/Output:   Intake/Output Summary (Last 24 hours) at 09/25/2020 0734 Last data filed at 09/25/2020 0411 Gross per 24 hour  Intake 1108.27 ml  Output 3400 ml  Net -2291.73 ml      Physical Exam    Cardiac: JVD 7, normal rate and rhythm, clear s1, high pitched AS murmur, no rubs or gallops, no LE edema Pulmonary: CTAB, not in distress Abdominal: non distended abdomen, soft and nontender Psych: Alert, conversant, in good spirits  Telemetry   NSR 80s + PVCs. Personally reviewed   Labs    CBC Recent Labs    09/23/20 1210 09/25/20 0410  WBC 8.2 9.1  HGB 13.4 14.7  HCT 42.6 48.0  MCV 78.5* 78.4*  PLT 217 159   Basic Metabolic Panel Recent Labs    09/24/20 0529 09/25/20 0410  NA 133* 132*  K 3.9 3.8  CL 98 98  CO2 25 25  GLUCOSE 144* 128*  BUN 37* 40*  CREATININE 1.45* 1.50*  CALCIUM 8.5* 8.5*   Liver Function Tests No results for input(s): AST, ALT, ALKPHOS, BILITOT, PROT, ALBUMIN in the last 72 hours. No results for input(s): LIPASE, AMYLASE in the last 72 hours. Cardiac Enzymes No results for input(s): CKTOTAL, CKMB,  CKMBINDEX, TROPONINI in the last 72 hours.  BNP: BNP (last 3 results) Recent Labs    09/11/20 1600 09/12/20 0708  BNP 1,413.0* 1,561.0*    ProBNP (last 3 results) No results for input(s): PROBNP in the last 8760 hours.   D-Dimer No results for input(s): DDIMER in the last 72 hours. Hemoglobin A1C No results for input(s): HGBA1C in the last 72 hours. Fasting Lipid Panel No results for input(s): CHOL, HDL, LDLCALC, TRIG, CHOLHDL, LDLDIRECT in the last 72 hours. Thyroid Function Tests No results for input(s): TSH, T4TOTAL, T3FREE, THYROIDAB in the last 72 hours.  Invalid input(s): FREET3  Other results:  R/LHC results 09/18/20:   1. Diffuse mild non-obstructive CAD 2. Critical calcific aortic stenosis 3. Markedly elevated filling pressure with normal cardiac output on dobutamine 4. 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 EXAM: CHEST - 2 VIEW COMPARISON:  September 15, 2020. FINDINGS: Stable cardiomediastinal silhouette. No pneumothorax is noted. Small bilateral pleural effusions are noted. Minimal bibasilar subsegmental atelectasis is noted. Bony thorax is unremarkable. IMPRESSION: Small bilateral pleural effusions. Minimal bibasilar subsegmental atelectasis. Electronically Signed   By: Marijo Conception M.D.   On: 09/24/2020 14:21  Medications:     Scheduled Medications: . aspirin EC  81 mg Oral Daily  . Chlorhexidine Gluconate Cloth  6 each Topical Daily  . [START ON 09/26/2020] enoxaparin (LOVENOX) injection  30 mg Subcutaneous Daily  . insulin aspart  0-15 Units Subcutaneous TID WC  . insulin aspart  0-5 Units Subcutaneous QHS  . magnesium sulfate  40 mEq Other To OR  . mouth rinse  15 mL Mouth Rinse BID  . nystatin   Topical TID  . potassium chloride  80 mEq Other To OR  . rosuvastatin  20 mg Oral Daily  . sodium chloride flush  10-40 mL Intracatheter Q12H  .  sodium chloride flush  3 mL Intravenous Q12H  . [START ON 09/26/2020] torsemide  40 mg Oral Daily    Infusions: . sodium chloride 10 mL/hr at 09/15/20 0400  . sodium chloride    . cefTRIAXone (ROCEPHIN)  IV 1 g (09/24/20 1348)  . cefUROXime (ZINACEF)  IV    . dexmedetomidine    . DOBUTamine 2.5 mcg/kg/min (09/24/20 0400)  . heparin 30,000 units/NS 1000 mL solution for CELLSAVER    . norepinephrine (LEVOPHED) Adult infusion    . vancomycin      PRN Medications: sodium chloride, sodium chloride, acetaminophen, Gerhardt's butt cream, guaiFENesin-dextromethorphan, ondansetron (ZOFRAN) IV, sodium chloride flush, sodium chloride flush, temazepam    Assessment/Plan   1. 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  ECHO 09/12/20 with EF down to 35-40%, worsening of AVA to 0.24 cm2 and mean gradient now 65.7 mmHg, new RWMA, new severely reduced RV function.  Likely due to worsening AS He has diffuse non obstructive CAD by LHC on 09/18/20.     - co-ox 67% on DBA 2.5  Will continue - good output with torsemide 40mg  and dobutamine, continue today will hold tomorrow for procedure - no b-blocker with low output - holding off on ARNI, spiro with AKI and high-grade AS  2. Severe Aortic Stenosis:  Critical stenosis, significantly progressed since 06/2020. By echo, worsening of AVA to 0.24 cm2 and mean gradient now 65.7 mmHg - R/LHC done on 2/8,  CT scans complete - Planning for TAVR on Tuesday - need to get foley out  3. AKI on CKD 3b: Baseline cr around 1.6. peaked at 2.36  Currently staying around baseline - Creatinine now stable at 1.4. Daily BMP  - Continue dobutamine.  - continue torsemide   4. 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 Hx of Traumatic subdural hematoma: 11/21 with craniotomy/evacuation.  He is on SCDs for now for DVT  prophylaxis.   - continue low dose lovenox  6. Renal mass on CT/Irregular liver surface - MRI complete. 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 Iron deficiency - Received Feraheme 2/12  8. Deconditioning - PT/OT  Length of Stay: Dulce, MD  09/25/2020, 7:34 AM  Advanced Heart Failure Team Pager [phone] (M-F; 7a - 4p)  Please contact [facility] for night-coverage after hours (4p -7a ) and weekends on [email or web address]  Patient seen and examined with the above-signed Advanced Practice Provider and/or Housestaff. I personally reviewed laboratory data, imaging studies and relevant notes. I independently examined the patient and formulated the important aspects of the plan.  I have edited the note to reflect any of my changes or salient points. I have personally discussed the plan with the patient and/or family.  Doing well this am. CVP and co-ox look good. Denies SOB, orthopnea or PND. For TAVR this am  General:  Sitting up in bed  No resp difficulty HEENT: normal Neck: supple. JVp 6-7. Carotids 2+ bilat; + bruits. No lymphadenopathy or thryomegaly appreciated. Cor: PMI nondisplaced. Regular rate & rhythm. 3/6 AS. Lungs: clear Abdomen: soft, nontender, nondistended. No hepatosplenomegaly. No bruits or masses. Good bowel sounds. Extremities: no cyanosis, clubbing, rash, edema Neuro: alert & orientedx3, cranial nerves grossly intact. moves all 4 extremities w/o difficulty. Affect pleasant  Plan TAVR today. Will continue dobutamine peri-procedure. Continue abx for cellulitis. Suspect he may need CIR post-procedure. Eventual outpatient w/u of right RCC.  Glori Bickers, MD  8:40 AM

## 2020-09-25 NOTE — Anesthesia Procedure Notes (Signed)
Arterial Line Insertion Start/End2/15/2022 8:15 AM, 09/25/2020 8:20 AM Performed by: Rande Brunt, CRNA, CRNA  Patient location: Pre-op. Preanesthetic checklist: patient identified, IV checked, site marked, risks and benefits discussed, surgical consent, monitors and equipment checked, pre-op evaluation, timeout performed and anesthesia consent Lidocaine 1% used for infiltration Right, radial was placed Catheter size: 20 G Hand hygiene performed  and maximum sterile barriers used  Allen's test indicative of satisfactory collateral circulation Attempts: 1 Procedure performed without using ultrasound guided technique. Following insertion, dressing applied and Biopatch. Post procedure assessment: normal and unchanged  Patient tolerated the procedure well with no immediate complications.

## 2020-09-25 NOTE — Transfer of Care (Signed)
Immediate Anesthesia Transfer of Care Note  Patient: Hampton Wixom  Procedure(s) Performed: TRANSCATHETER AORTIC VALVE REPLACEMENT, TRANSFEMORAL (N/A Chest) TRANSESOPHAGEAL ECHOCARDIOGRAM (TEE) (N/A )  Patient Location: Cath Lab  Anesthesia Type:MAC  Level of Consciousness: drowsy, patient cooperative and responds to stimulation  Airway & Oxygen Therapy: Patient Spontanous Breathing and Patient connected to face mask oxygen  Post-op Assessment: Report given to RN, Post -op Vital signs reviewed and stable and Patient moving all extremities  Post vital signs: Reviewed and stable  Last Vitals:  Vitals Value Taken Time  BP 100/58 09/25/20 1219  Temp    Pulse 63 09/25/20 1223  Resp 21 09/25/20 1223  SpO2 100 % 09/25/20 1223  Vitals shown include unvalidated device data.  Last Pain:  Vitals:   09/25/20 0411  TempSrc: Oral  PainSc:       Patients Stated Pain Goal: 0 (29/56/21 3086)  Complications: No complications documented.

## 2020-09-25 NOTE — Discharge Instructions (Signed)
ACTIVITY AND EXERCISE °• Daily activity and exercise are an important part of your recovery. People recover at different rates depending on their general health and type of valve procedure. °• Most people recovering from TAVR feel better relatively quickly  °• No lifting, pushing, pulling more than 10 pounds (examples to avoid: groceries, vacuuming, gardening, golfing): °            - For one week with a procedure through the groin. °            - For six weeks for procedures through the chest wall or neck. °NOTE: You will typically see one of our providers 7-14 days after your procedure to discuss WHEN TO RESUME the above activities.  °  °  °DRIVING °• Do not drive until you are seen for follow up and cleared by a provider. Generally, we ask patient to not drive for 1 week after their procedure. °• If you have been told by your doctor in the past that you may not drive, you must talk with him/her before you begin driving again. °  °DRESSING °• Groin site: you may leave the clear dressing over the site for up to one week or until it falls off. °  °HYGIENE °• If you had a femoral (leg) procedure, you may take a shower when you return home. After the shower, pat the site dry. Do NOT use powder, oils or lotions in your groin area until the site has completely healed. °• If you had a chest procedure, you may shower when you return home unless specifically instructed not to by your discharging practitioner. °            - DO NOT scrub incision; pat dry with a towel. °            - DO NOT apply any lotions, oils, powders to the incision. °            - No tub baths / swimming for at least 2 weeks. °• If you notice any fevers, chills, increased pain, swelling, bleeding or pus, please contact your doctor. °  °ADDITIONAL INFORMATION °• If you are going to have an upcoming dental procedure, please contact our office as you will require antibiotics ahead of time to prevent infection on your heart valve.  ° ° °If you have any  questions or concerns you can call the structural heart phone during normal business hours 8am-4pm. If you have an urgent need after hours or weekends please call [phone] to talk to the on call provider for general cardiology. If you have an emergency that requires immediate attention, please call 911.  ° ° °After TAVR Checklist ° °Check  Test Description  ° Follow up appointment in 1-2 weeks  You will see our structural heart physician assistant, Katie Dezi Brauner. Your incision sites will be checked and you will be cleared to drive and resume all normal activities if you are doing well.    ° 1 month echo and follow up  You will have an echo to check on your new heart valve and be seen back in the office by Katie Rihan Schueler. Many times the echo is not read by your appointment time, but Katie will call you later that day or the following day to report your results.  ° Follow up with your primary cardiologist You will need to be seen by your primary cardiologist in the following 3-6 months after your 1 month appointment in the valve   clinic. Often times your Plavix or Aspirin will be discontinued during this time, but this is decided on a case by case basis.   ° 1 year echo and follow up You will have another echo to check on your heart valve after 1 year and be seen back in the office by Katie Floriene Jeschke. This your last structural heart visit.  ° Bacterial endocarditis prophylaxis  You will have to take antibiotics for the rest of your life before all dental procedures (even teeth cleanings) to protect your heart valve. Antibiotics are also required before some surgeries. Please check with your cardiologist before scheduling any surgeries. Also, please make sure to tell us if you have a penicillin allergy as you will require an alternative antibiotic.   ° ° °

## 2020-09-25 NOTE — Anesthesia Preprocedure Evaluation (Signed)
Anesthesia Evaluation  Patient identified by MRN, date of birth, ID band Patient awake    Reviewed: Allergy & Precautions, H&P , NPO status , Patient's Chart, lab work & pertinent test results  Airway Mallampati: II   Neck ROM: full    Dental   Pulmonary neg pulmonary ROS,    breath sounds clear to auscultation       Cardiovascular hypertension, +CHF  + Valvular Problems/Murmurs AS  Rhythm:regular Rate:Normal  RBBB   Neuro/Psych    GI/Hepatic   Endo/Other  diabetes, Type 2  Renal/GU Renal InsufficiencyRenal diseaseRenal cell CA     Musculoskeletal   Abdominal   Peds  Hematology   Anesthesia Other Findings   Reproductive/Obstetrics                             Anesthesia Physical Anesthesia Plan  ASA: III  Anesthesia Plan: MAC   Post-op Pain Management:    Induction: Intravenous  PONV Risk Score and Plan: 1 and Ondansetron and Treatment may vary due to age or medical condition  Airway Management Planned:   Additional Equipment: Arterial line  Intra-op Plan:   Post-operative Plan:   Informed Consent: I have reviewed the patients History and Physical, chart, labs and discussed the procedure including the risks, benefits and alternatives for the proposed anesthesia with the patient or authorized representative who has indicated his/her understanding and acceptance.     Dental advisory given  Plan Discussed with: CRNA, Anesthesiologist and Surgeon  Anesthesia Plan Comments:         Anesthesia Quick Evaluation

## 2020-09-25 NOTE — Progress Notes (Signed)
Checked on patient and comfort measures offered.

## 2020-09-26 ENCOUNTER — Encounter (HOSPITAL_COMMUNITY): Payer: Self-pay | Admitting: Cardiovascular Disease

## 2020-09-26 ENCOUNTER — Inpatient Hospital Stay (HOSPITAL_COMMUNITY): Payer: Medicare Other

## 2020-09-26 DIAGNOSIS — Z006 Encounter for examination for normal comparison and control in clinical research program: Secondary | ICD-10-CM | POA: Diagnosis not present

## 2020-09-26 DIAGNOSIS — Z952 Presence of prosthetic heart valve: Secondary | ICD-10-CM

## 2020-09-26 DIAGNOSIS — I130 Hypertensive heart and chronic kidney disease with heart failure and stage 1 through stage 4 chronic kidney disease, or unspecified chronic kidney disease: Secondary | ICD-10-CM | POA: Diagnosis not present

## 2020-09-26 DIAGNOSIS — I350 Nonrheumatic aortic (valve) stenosis: Secondary | ICD-10-CM | POA: Diagnosis not present

## 2020-09-26 DIAGNOSIS — I5043 Acute on chronic combined systolic (congestive) and diastolic (congestive) heart failure: Secondary | ICD-10-CM | POA: Diagnosis not present

## 2020-09-26 LAB — CBC
HCT: 45.8 % (ref 39.0–52.0)
Hemoglobin: 14.1 g/dL (ref 13.0–17.0)
MCH: 24.3 pg — ABNORMAL LOW (ref 26.0–34.0)
MCHC: 30.8 g/dL (ref 30.0–36.0)
MCV: 79 fL — ABNORMAL LOW (ref 80.0–100.0)
Platelets: 184 10*3/uL (ref 150–400)
RBC: 5.8 MIL/uL (ref 4.22–5.81)
RDW: 16.3 % — ABNORMAL HIGH (ref 11.5–15.5)
WBC: 9.6 10*3/uL (ref 4.0–10.5)
nRBC: 0 % (ref 0.0–0.2)

## 2020-09-26 LAB — COOXEMETRY PANEL
Carboxyhemoglobin: 1.4 % (ref 0.5–1.5)
Methemoglobin: 0.8 % (ref 0.0–1.5)
O2 Saturation: 67.6 %
Total hemoglobin: 14.2 g/dL (ref 12.0–16.0)

## 2020-09-26 LAB — BASIC METABOLIC PANEL
Anion gap: 9 (ref 5–15)
BUN: 40 mg/dL — ABNORMAL HIGH (ref 8–23)
CO2: 23 mmol/L (ref 22–32)
Calcium: 7.8 mg/dL — ABNORMAL LOW (ref 8.9–10.3)
Chloride: 98 mmol/L (ref 98–111)
Creatinine, Ser: 1.38 mg/dL — ABNORMAL HIGH (ref 0.61–1.24)
GFR, Estimated: 52 mL/min — ABNORMAL LOW (ref >60)
Glucose, Bld: 150 mg/dL — ABNORMAL HIGH (ref 70–99)
Potassium: 3.8 mmol/L (ref 3.5–5.1)
Sodium: 130 mmol/L — ABNORMAL LOW (ref 135–145)

## 2020-09-26 LAB — ECHOCARDIOGRAM COMPLETE
AR max vel: 1.18 cm2
AV Area VTI: 1.25 cm2
AV Area mean vel: 1.23 cm2
AV Mean grad: 11.5 mmHg
AV Peak grad: 21.6 mmHg
Ao pk vel: 2.33 m/s
Area-P 1/2: 2.29 cm2
Height: 71 in
MV VTI: 1.68 cm2
S' Lateral: 4 cm
Weight: 3830.71 oz

## 2020-09-26 LAB — MAGNESIUM: Magnesium: 1.9 mg/dL (ref 1.7–2.4)

## 2020-09-26 LAB — GLUCOSE, CAPILLARY
Glucose-Capillary: 125 mg/dL — ABNORMAL HIGH (ref 70–99)
Glucose-Capillary: 139 mg/dL — ABNORMAL HIGH (ref 70–99)
Glucose-Capillary: 204 mg/dL — ABNORMAL HIGH (ref 70–99)
Glucose-Capillary: 141 mg/dL — ABNORMAL HIGH (ref 70–99)

## 2020-09-26 LAB — POCT ACTIVATED CLOTTING TIME
Activated Clotting Time: 148 seconds
Activated Clotting Time: 386 seconds

## 2020-09-26 MED ORDER — MAGNESIUM SULFATE IN D5W 1-5 GM/100ML-% IV SOLN
1.0000 g | Freq: Once | INTRAVENOUS | Status: AC
  Administered 2020-09-26: 1 g via INTRAVENOUS
  Filled 2020-09-26: qty 100

## 2020-09-26 MED ORDER — SPIRONOLACTONE 12.5 MG HALF TABLET
12.5000 mg | ORAL_TABLET | Freq: Every day | ORAL | Status: DC
  Administered 2020-09-26 – 2020-09-27 (×2): 12.5 mg via ORAL
  Filled 2020-09-26 (×3): qty 1

## 2020-09-26 MED FILL — Heparin Sod (Porcine)-NaCl IV Soln 1000 Unit/500ML-0.9%: INTRAVENOUS | Qty: 1500 | Status: AC

## 2020-09-26 NOTE — Progress Notes (Signed)
Mobility Specialist - Progress Note   09/26/20 1528  Mobility  Activity Turned to right side;Turned to left side  Level of Assistance Minimal assist, patient does 75% or more  Assistive Device None  Mobility Response Tolerated well  Mobility performed by Mobility specialist  $Mobility charge 1 Mobility   Pt resistant to getting out of bed or sitting up due to fear of falling as he states his knees were buckling earlier. It was found that his catheter slipped off so pt was able to log roll himself in order to be cleaned and his sheets changed. VSS.   Pricilla Handler Mobility Specialist Mobility Specialist Phone: [phone]

## 2020-09-26 NOTE — Plan of Care (Signed)

## 2020-09-26 NOTE — Anesthesia Postprocedure Evaluation (Signed)
Anesthesia Post Note  Patient: Brett Williamson  Procedure(s) Performed: TRANSCATHETER AORTIC VALVE REPLACEMENT, TRANSFEMORAL (N/A Chest) TRANSESOPHAGEAL ECHOCARDIOGRAM (TEE) (N/A )     Patient location during evaluation: Cath Lab Anesthesia Type: MAC Level of consciousness: awake and alert Pain management: pain level controlled Vital Signs Assessment: post-procedure vital signs reviewed and stable Respiratory status: spontaneous breathing, nonlabored ventilation, respiratory function stable and patient connected to nasal cannula oxygen Cardiovascular status: stable and blood pressure returned to baseline Postop Assessment: no apparent nausea or vomiting Anesthetic complications: no   No complications documented.  Last Vitals:  Vitals:   09/26/20 0408 09/26/20 0801  BP: 133/80 134/72  Pulse: 80 69  Resp: 20 17  Temp: 36.8 C 36.8 C  SpO2: 95% 96%    Last Pain:  Vitals:   09/26/20 0801  TempSrc: Oral  PainSc:                  [place]

## 2020-09-26 NOTE — Progress Notes (Addendum)
CARDIAC REHAB PHASE I   PRE:  Rate/Rhythm: 78 SR  BP:  Supine: 134/72  Sitting:   Standing:    SaO2: 94%RA  [id number] Waited for ECHO to finish with pt prior to seeing. Gave pt CHF booklet and low sodium diets. Pt stated his family brings in food, or before his surgery in November, he ate out a lot. Discussed trying to watch salt.   Pt stated he does not add but does eat a lot of cold cuts. Made a few suggestions of healthy foods. Pt does not have scales to weigh . Discussed letting MD know if he feels more SOB or sees increased swelling in legs or belly. Pt stated he has lift chair so he sleeps in recliner a lot. Pt not appropriate for CRP 2 due to mobility issues. Will let PT see today and make recommendations for discharge. Pt stated he had HHPT  after brain surgery. Will sign off.            Graylon Good, RN BSN  09/26/2020 9:16 AM

## 2020-09-26 NOTE — Progress Notes (Addendum)
Patient ID: Brett Williamson, male   DOB: 07-29-1941, 80 y.o.   MRN: [id number]     Advanced Heart Failure Rounding Note  PCP-Cardiologist: No primary care provider on file.   Subjective:    2/15- S/P successful TAVR procedure on 2/15 with increase in EF from 30% to 45% post TAVR and with trivial perivalvular leak, Normal functioning aortic valve prosthesis    Breathing well, torsemide held yesterday for procedure, Off dobutamine coox excellent at 67%. Weight up minimally, CVP 8-9.  No bmp although this was ordered   Objective:   Weight Range: 108.6 kg Body mass index is 33.39 kg/m.   Vital Signs:   Temp:  [97.6 F (36.4 C)-98.6 F (37 C)] 98.2 F (36.8 C) (02/16 0408) Pulse Rate:  [26-157] 80 (02/16 0408) Resp:  [0-26] 20 (02/16 0408) BP: (86-133)/(45-83) 133/80 (02/16 0408) SpO2:  [0 %-100 %] 95 % (02/16 0408) Arterial Line BP: (93-120)/(47-54) 110/50 (02/15 1310) Weight:  [108.6 kg] 108.6 kg (02/16 0645) Last BM Date: 09/23/20  Weight change: Filed Weights   09/24/20 0408 09/25/20 0411 09/26/20 0645  Weight: 107.8 kg 107.6 kg 108.6 kg    Intake/Output:   Intake/Output Summary (Last 24 hours) at 09/26/2020 0732 Last data filed at 09/26/2020 0400 Gross per 24 hour  Intake 1874.26 ml  Output 1050 ml  Net 824.26 ml      Physical Exam    Cardiac: JVD 9, normal rate and rhythm, clear s1 s2, no rubs or gallops, no LE edema Pulmonary: CTAB, not in distress Abdominal: non distended abdomen, soft and nontender Psych: Alert, conversant, in good spirits  Telemetry   NSR 80s + PVCs. Personally reviewed   Labs    CBC Recent Labs    09/25/20 0410 09/25/20 1235 09/26/20 0427  WBC 9.1  --  9.6  HGB 14.7 15.3 14.1  HCT 48.0 45.0 45.8  MCV 78.4*  --  79.0*  PLT 222  --  016   Basic Metabolic Panel Recent Labs    09/24/20 0529 09/25/20 0410 09/25/20 1235 09/26/20 0427  NA 133* 132* 133*  --   K 3.9 3.8 3.9  --   CL 98 98 98  --   CO2 25 25  --   --    GLUCOSE 144* 128* 169*  --   BUN 37* 40* 43*  --   CREATININE 1.45* 1.50* 1.60*  --   CALCIUM 8.5* 8.5*  --   --   MG  --   --   --  1.9   Liver Function Tests No results for input(s): AST, ALT, ALKPHOS, BILITOT, PROT, ALBUMIN in the last 72 hours. No results for input(s): LIPASE, AMYLASE in the last 72 hours. Cardiac Enzymes No results for input(s): CKTOTAL, CKMB, CKMBINDEX, TROPONINI in the last 72 hours.  BNP: BNP (last 3 results) Recent Labs    09/11/20 1600 09/12/20 0708  BNP 1,413.0* 1,561.0*    ProBNP (last 3 results) No results for input(s): PROBNP in the last 8760 hours.   D-Dimer No results for input(s): DDIMER in the last 72 hours. Hemoglobin A1C No results for input(s): HGBA1C in the last 72 hours. Fasting Lipid Panel No results for input(s): CHOL, HDL, LDLCALC, TRIG, CHOLHDL, LDLDIRECT in the last 72 hours. Thyroid Function Tests No results for input(s): TSH, T4TOTAL, T3FREE, THYROIDAB in the last 72 hours.  Invalid input(s): FREET3  Other results:  R/LHC results 09/18/20:   1. Diffuse mild non-obstructive CAD 2. Critical calcific aortic stenosis 3.  Markedly elevated filling pressure with normal cardiac output on dobutamine 4. 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 Exam Location:  Inpatient Procedure: Limited Echo, Color Doppler and Cardiac Doppler Indications:     Aortic Stenosis i35.0  History:         Patient has prior history of Echocardiogram examinations, most                  recent 09/12/2020. CHF; Risk Factors:Hypertension and Diabetes.                   Aortic Valve: 29 mm Edwards Sapien prosthetic, stented (TAVR)                  valve is present in the aortic position. Procedure Date:                  09/25/2020.  Sonographer:     Raquel Sarna Senior RDCS Referring Phys:  [id number] Eileen Stanford Diagnosing Phys: Sanda Klein MD  Sonographer Comments: 62mm Edwards Sapien 3 Ultra TAVR PRE-PROCEDURE FINDINGS Moderately-to-severely depressed left ventricular systolic function, estimated EF 30%. Probably trileaflet aortic valve. Moderate to severe calcific aortic stenosis. No aortic insufficiency. Peak aortic gradient 108 mm Hg, mean gradient 84 mm Hg, dimensionless index 0.09, calculated valve area 0.3cm (0.13 cm/m indexed for BSA). Acceleration time 116 ms. Mild mitral insufficiency. No pericardial effusion. POST-PROCEDURE FINDINGS Mildly reduced left ventricular systolic function, estimated EF 45%. Well seated 73mm Edwards Sapien 3 Ultra TAVR stent valve (TAVR). There is a trivial perivalvular leak. Peak aortic gradient 10 mm Hg, mean gradient 5 mm Hg, dimensionless index 0.56, calculated valve area 2.21 cm (0.97 cm/m indexed for BSA). Acceleration time 74 ms. Trivial mitral insufficiency. No pericardial effusion. IMPRESSIONS  1. Left ventricular ejection fraction, by estimation, is 40 to 45%. The left ventricle has mildly decreased function. The left ventricle demonstrates global hypokinesis. There is moderate concentric left ventricular hypertrophy.  2. Right ventricular systolic function is normal. The right ventricular size is normal.  3. Mild mitral valve regurgitation. Moderate mitral annular calcification.  4. The aortic valve has been repaired/replaced. There is a 29 mm Edwards Sapien prosthetic (TAVR) valve present in the aortic position. Procedure Date: 09/25/2020. Echo findings are consistent with normal structure and function of the aortic valve prosthesis. FINDINGS  Left Ventricle: Left ventricular ejection fraction, by estimation, is 40 to  45%. The left ventricle has mildly decreased function. The left ventricle demonstrates global hypokinesis. The left ventricular internal cavity size was normal in size. There is  moderate concentric left ventricular hypertrophy. Right Ventricle: The right ventricular size is normal. No increase in right ventricular wall thickness. Right ventricular systolic function is normal. Left Atrium:  Left atrial size was normal in size. Right Atrium: Right atrial size was normal in size. Pericardium: There is no evidence of pericardial effusion. Mitral Valve: There is mild thickening of the mitral valve leaflet(s). Moderate mitral annular calcification. Mild mitral valve regurgitation. Aortic Valve: The aortic valve has been repaired/replaced. Aortic valve mean gradient measures 5.0 mmHg. Aortic valve peak gradient measures 10.0 mmHg. Aortic valve area, by VTI measures 2.21 cm. There is a 29 mm Edwards Sapien prosthetic, stented (TAVR) valve present in the aortic position. Procedure Date: 09/25/2020. Echo findings are consistent with normal structure and function of the aortic valve prosthesis. Pulmonic Valve: The pulmonic valve was not assessed. Aorta: The aortic root is normal in size and structure. IAS/Shunts: The interatrial septum was not assessed. LEFT VENTRICLE PLAX 2D LVOT diam:     2.10 cm LV SV:         55 LV SV Index:   24 LVOT Area:     3.46 cm  AORTIC VALVE AV Area (Vmax):    1.93 cm AV Area (Vmean):   2.37 cm AV Area (VTI):     2.21 cm AV Vmax:           158.00 cm/s AV Vmean:          100.000 cm/s AV VTI:            0.249 m AV Peak Grad:      10.0 mmHg AV Mean Grad:      5.0 mmHg LVOT Vmax:         88.10 cm/s LVOT Vmean:        68.500 cm/s LVOT VTI:          0.159 m LVOT/AV VTI ratio: 0.64  SHUNTS Systemic VTI:  0.16 m Systemic Diam: 2.10 cm Sanda Klein MD Electronically signed by Sanda Klein MD Signature Date/Time: 09/25/2020/12:11:51 PM    Final    Structural Heart Procedure  Result Date:  09/25/2020 See surgical note for result.    Medications:     Scheduled Medications: . aspirin EC  81 mg Oral Daily  . Chlorhexidine Gluconate Cloth  6 each Topical Daily  . clopidogrel  75 mg Oral Q breakfast  . insulin aspart  0-15 Units Subcutaneous TID WC  . insulin aspart  0-5 Units Subcutaneous QHS  . mouth rinse  15 mL Mouth Rinse BID  . nystatin   Topical TID  . rosuvastatin  20 mg Oral Daily  . sodium chloride flush  10-40 mL Intracatheter Q12H  . sodium chloride flush  3 mL Intravenous Q12H  . torsemide  40 mg Oral Daily    Infusions: . sodium chloride    . cefTRIAXone (ROCEPHIN)  IV 1 g (09/24/20 1348)    PRN Medications: sodium chloride, acetaminophen **OR** acetaminophen, Gerhardt's butt cream, guaiFENesin-dextromethorphan, morphine injection, ondansetron (ZOFRAN) IV, oxyCODONE, sodium chloride flush, sodium chloride flush, traMADol    Assessment/Plan   1. 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  ECHO 09/12/20 with EF down to 35-40%, worsening of AVA to 0.24 cm2 and mean gradient now 65.7 mmHg, new RWMA, new severely reduced RV function.  Likely due to worsening AS He has diffuse non obstructive CAD by LHC on 09/18/20.     - co-ox 67%  - Off dobutamine with good coox and improved BP will repeat bmp and can likely start to add  GDMT -restart torsemide today  2. Severe Aortic Stenosis:  Critical stenosis, significantly progressed since 06/2020. By echo, worsening of AVA to 0.24 cm2 and mean gradient now 65.7 mmHg - R/LHC done on 2/8,  CT scans complete - s/p Successful TAVR 2/15, repeat TTE today  3. AKI on CKD 3b: Baseline cr around 1.6. peaked at 2.36  Currently staying around baseline - Creatinine pending. Daily BMP  - Off dobutamine post TAVR procedure - continue torsemide today need bmp will reorder  4. CAD - non-obstructive by  cath this admit - Continue ASA/statin  5. Hx of Traumatic subdural hematoma: 11/21 with craniotomy/evacuation.  He is on SCDs for now for DVT prophylaxis.   - continue low dose lovenox  6. Renal mass on CT/Irregular liver surface - MRI complete. 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 Iron deficiency - Received Feraheme 2/12  8. Deconditioning - PT/OT  9. LE wounds with surrounding erythema -covering for cellulitis with IV ceftriaxone also rec'd vanc and cefuroxime yesterday for procedure  10. Cellulitis -on ceftriaxone  Length of Stay: [place], MD  09/26/2020, 7:32 AM  Advanced Heart Failure Team Pager [phone] (M-F; [place])  Please contact [facility] for night-coverage after hours (4p -7a ) and weekends on [email or web address]   Katherine Roan, MD  7:32 AM    Patient seen and examined with the above-signed Advanced Practice Provider and/or Housestaff. I personally reviewed laboratory data, imaging studies and relevant notes. I independently examined the patient and formulated the important aspects of the plan. I have edited the note to reflect any of my changes or salient points. I have personally discussed the plan with the patient and/or family.  S/p TAVR on 2/14. Looks good. Co-ox 69% off dobutamine. Echo EF 45-50% with AVGm 21mmHG  General:  Well appearing. No resp difficulty HEENT: normal Neck: supple. no JVD. Carotids 2+ bilat; no bruits. No lymphadenopathy or thryomegaly appreciated. Cor: PMI nondisplaced. Regular rate & rhythm. No rubs, gallops or murmurs. Lungs: clear Abdomen: soft, nontender, nondistended. No hepatosplenomegaly. No bruits or masses. Good bowel sounds. Extremities: no cyanosis, clubbing, rash, edema dark toes.  Neuro: alert & orientedx3, cranial nerves grossly intact. moves all 4 extremities w/o difficulty. Affect pleasant  He is  doing very well post TAVR. Volume status looks good. Co-ox 69% off dobutamine. EF 45-50% TAVR valve well situated (echo reviewed personally). Torsemide added back. Will add spiro 12.5 as well. May need to cut torsemide back to 20. D/w Structural team.   Glori Bickers, MD  9:40 AM

## 2020-09-26 NOTE — Progress Notes (Signed)
Occupational Therapy Re-Evaluation Patient Details Name: Brett Williamson MRN: [id number] DOB: 1940-08-19 Today's Date: 09/26/2020    History of Present Illness 80 yo male with who presented to the ER with worsening shortness of breath especially with short distances of ambulating. 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 PMH diastolic CHF, SHF0Y, DMII, recent SDH (s/p right craniotomy 06/19/20), deconditioning (d/c from rehab on 07/05/20), HTN   Clinical Impression   Pt progressing well for OOB and ADL tasks. Pt currently maxA without AE for LB ADL mostly due to arthritic pain. Pt sitting EOB and able to simulate sliding shoes on. Pt at EOB performing light grooming in hair. Pt set-upA for grooming in recliner. Pt education on taking it easier and asking for help from daughter. Pt reports he has a lift chair at home and pt education on attempting to use the lift portion less. Pt ambulating from bed to recliner taking a few steps with RW and minguardA; pt walking 10' x2 to times. O2 >90% on RA;  HR 80-90 BPM with exertion.  Pt would benefit from continued OT skilled services for ADL, mobility and safety in [place] setting. OT following acutely.    Follow Up Recommendations  Home health OT;Supervision - Intermittent    Equipment Recommendations  None recommended by OT    Recommendations for Other Services       Precautions / Restrictions Precautions Precautions: Fall Restrictions Weight Bearing Restrictions: No      Mobility Bed Mobility Overal bed mobility: Needs Assistance Bed Mobility: Supine to Sit     Supine to sit: Min assist     General bed mobility comments: Pt using rail to EOB and scooting hips toward EOB    Transfers Overall transfer level: Needs assistance Equipment used: Rolling walker (2 wheeled)   Sit to Stand: Min assist;Min guard;From elevated surface Stand pivot transfers: Min assist;From elevated surface       General transfer comment: Pt minA for initial stand; minguardA thereafter  with bed slightly elevated x2 times from bed and x2 times from standing from recliner.    Balance Overall balance assessment: Needs assistance Sitting-balance support: No upper extremity supported;Feet supported Sitting balance-Leahy Scale: Good     Standing balance support: Bilateral upper extremity supported;During functional activity Standing balance-Leahy Scale: Poor Standing balance comment: heavily reliant on BUE support with RW                           ADL either performed or assessed with clinical judgement   ADL Overall ADL's : Needs assistance/impaired Eating/Feeding: Set up;Sitting   Grooming: Set up;Sitting               Lower Body Dressing: Maximal assistance;Sitting/lateral leans;Bed level;Sit to/from stand Lower Body Dressing Details (indicate cue type and reason): Pt unable to assist with donning socks Toilet Transfer: Min Lobbyist Details (indicate cue type and reason): stand pivot simulated with recliner         Functional mobility during ADLs: Rolling walker;Min guard;Cueing for sequencing General ADL Comments: Pt used to using AE for LB Dressing, maxA without AE. Pt sitting EOB and able to simulate sliding shoes on. Pt at EOB performing light grooming in hair. Pt set-upA for grooming in recliner. Pt education on taking it easier and asking for help from daughter. Pt reports he has a lift chair at home and pt education on attempting to use the lift portion less.     Vision Baseline Vision/History: No visual deficits  Patient Visual Report: No change from baseline Vision Assessment?: No apparent visual deficits     Perception     Praxis      Pertinent Vitals/Pain Pain Assessment: 0-10 Pain Score: 0-No pain Pain Location: back Pain Descriptors / Indicators: Discomfort;Grimacing Pain Intervention(s): Monitored during session;Repositioned     Hand Dominance Right   Extremity/Trunk Assessment Upper Extremity  Assessment Upper Extremity Assessment: Generalized weakness RUE Deficits / Details: Apparent RTC insufficiency, but functional; R index finger injury with contracture; arthritic joints all over LUE Deficits / Details: Apparent RTC insufficiency, but functional; R index finger injury with contracture; arthritic joints all over       Cervical / Trunk Assessment Cervical / Trunk Assessment: Kyphotic;Other exceptions Cervical / Trunk Exceptions: forward head posture   Communication Communication Communication: No difficulties   Cognition Arousal/Alertness: Awake/alert Behavior During Therapy: WFL for tasks assessed/performed Overall Cognitive Status: No family/caregiver present to determine baseline cognitive functioning                                     General Comments  O2 >90% on RA;  HR 80-90 BPM with exertion. Pt ambulating from bed to recliner taking a few steps with RW and minguardA; pt walking 10' x2 to go around the bed and come back around.    Exercises     Shoulder Instructions      Home Living Family/patient expects to be discharged to:: Private residence Living Arrangements: Alone Available Help at Discharge: Available PRN/intermittently;Family;Friend(s) Type of Home: Apartment Home Access: Level entry     Home Layout: One level     Bathroom Shower/Tub: Teacher, early years/pre: Handicapped height Bathroom Accessibility: Yes How Accessible: Accessible via walker Home Equipment: Grab bars - toilet;Grab bars - tub/shower;Adaptive equipment;Walker - 4 wheels;Tub bench Adaptive Equipment: Sock aid;Reacher Additional Comments: 32"doorways      Prior Functioning/Environment Level of Independence: Independent with assistive device(s)        Comments: Pt reports he uses a sock aid for socks. He has long standing numbness/tingling in fingertips bil hands.   Heambulates with RW.  He drives.  He grocery shops sometimes, and daughter and  girlfriend assist PRN.  Pt also reports he was driving PTA        OT Problem List: Decreased strength;Decreased range of motion;Decreased activity tolerance;Impaired balance (sitting and/or standing);Decreased safety awareness;Decreased knowledge of use of DME or AE;Cardiopulmonary status limiting activity;Obesity;Impaired UE functional use;Pain      OT Treatment/Interventions: Self-care/ADL training;Therapeutic exercise;Energy conservation;DME and/or AE instruction;Therapeutic activities;Cognitive remediation/compensation;Visual/perceptual remediation/compensation;Patient/family education;Balance training    OT Goals(Current goals can be found in the care plan section) Acute Rehab OT Goals Patient Stated Goal: to have his valves replaced and get stronger OT Goal Formulation: With patient Time For Goal Achievement: 10/05/20 Potential to Achieve Goals: Good  OT Frequency: Min 2X/week   Barriers to D/C:            Co-evaluation              AM-PAC OT "6 Clicks" Daily Activity     Outcome Measure Help from another person eating meals?: None Help from another person taking care of personal grooming?: A Little Help from another person toileting, which includes using toliet, bedpan, or urinal?: A Little Help from another person bathing (including washing, rinsing, drying)?: A Little Help from another person to put on and taking off regular upper body  clothing?: A Little Help from another person to put on and taking off regular lower body clothing?: A Little 6 Click Score: 19   End of Session Equipment Utilized During Treatment: Rolling walker;Gait belt Nurse Communication: Mobility status  Activity Tolerance: Patient tolerated treatment well Patient left: in chair;with call bell/phone within reach  OT Visit Diagnosis: Unsteadiness on feet (R26.81);Muscle weakness (generalized) (M62.81);Pain Pain - Right/Left:  (bilat) Pain - part of body: Knee                Time:  1030-1100 OT Time Calculation (min): 30 min Charges:  OT General Charges $OT Visit: 1 Visit OT Evaluation $OT Re-eval: 1 Re-eval OT Treatments $Self Care/Home Management : 8-22 mins  Jefferey Pica, OTR/L Acute Rehabilitation Services Pager: [phone] Office: [phone]   Iman Orourke C 09/26/2020, 11:57 AM

## 2020-09-26 NOTE — Progress Notes (Signed)
Physical Therapy Treatment Patient Details Name: Brett Williamson MRN: [id number] DOB: July 20, 1941 Today's Date: 09/26/2020    History of Present Illness 80 yo male with who presented to the ER with worsening shortness of breath especially with short distances of ambulating. 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 PMH diastolic CHF, BMW4X, DMII, recent SDH (s/p right craniotomy 06/19/20), deconditioning (d/c from rehab on 07/05/20), HTN    PT Comments    Pt received in chair, agreeable to therapy session and with good participation and fair tolerance for mobility, daughter present and encouraging. Pt able to progress gait distance to 5ft with RW and min guard to minA, per daughter this is less than his baseline. Discussed disposition recommendation with daughter, per pt/daughter he would not be interested in low intensity post-acute rehab and would prefer HHPT, if plan to DC home pt would benefit from wheelchair due to decreased activity tolerance and not yet able to ambulate household distances without chair follow for safety. Pt continues to benefit from PT services to progress toward functional mobility goals. Discharge recommendations updated below per discussion with supervising PT Dawn W and family.   Follow Up Recommendations  Home health PT;Supervision/Assistance - 24 hour (initial 24hr Supervision (daughter plans to provide))     Equipment Recommendations  Wheelchair (measurements PT);Wheelchair cushion (measurements PT)    Recommendations for Other Services       Precautions / Restrictions Precautions Precautions: Fall Restrictions Weight Bearing Restrictions: No    Mobility  Bed Mobility Overal bed mobility: Needs Assistance Bed Mobility: Sit to Supine       Sit to supine: Min assist   General bed mobility comments: cues for log roll sequencing due to back pain    Transfers Overall transfer level: Needs assistance Equipment used: Rolling walker (2 wheeled)   Sit to Stand: Min assist          General transfer comment: Pt minA for initial stand from chair; minA for stand>sit due to BLE buckling with fatigue to EOB  Ambulation/Gait Ambulation/Gait assistance: Min guard Gait Distance (Feet): 24 Feet Assistive device: Rolling walker (2 wheeled) Gait Pattern/deviations: Step-through pattern;Decreased stride length;Wide base of support;Antalgic (BLE buckling at end of trial) Gait velocity: grossly <0.3 m/s   General Gait Details: pt with bil "creaking" in knees, pt and daughter reports his gait distance is less than baseline but he wants more therapy for strengthening at home; cues for body orientation with RW needed with turns.   Stairs             Wheelchair Mobility    Modified Rankin (Stroke Patients Only)       Balance Overall balance assessment: Needs assistance Sitting-balance support: No upper extremity supported;Feet supported Sitting balance-Leahy Scale: Good     Standing balance support: Bilateral upper extremity supported;During functional activity Standing balance-Leahy Scale: Poor Standing balance comment: heavily reliant on BUE support of RW and external assist due to buckling                            Cognition Arousal/Alertness: Awake/alert Behavior During Therapy: WFL for tasks assessed/performed Overall Cognitive Status: History of cognitive impairments - at baseline                                 General Comments: per dtr pt close to baseline, pt with fair carryover of instruction from previous session      Exercises  Other Exercises Other Exercises: supine BLE AROM: APs, GS, QS 1x10 reps ea Pt given "General Strengthening" handout to perform TID.    General Comments General comments (skin integrity, edema, etc.): O2 93% after short gait trial on RA, 97% resting prior to gait on RA; HR 61 resting and 70's bpm during mobility      Pertinent Vitals/Pain Pain Assessment: Faces Faces Pain Scale: Hurts little  more Pain Location: back and B knees (arthritis) Pain Descriptors / Indicators: Discomfort;Grimacing Pain Intervention(s): Monitored during session;Premedicated before session;Repositioned    Home Living                      Prior Function            PT Goals (current goals can now be found in the care plan section) Acute Rehab PT Goals Patient Stated Goal: to have his valves replaced and get stronger PT Goal Formulation: With patient/family Time For Goal Achievement: 10/05/20 Potential to Achieve Goals: Good Progress towards PT goals: Progressing toward goals    Frequency    Min 3X/week      PT Plan Current plan remains appropriate    Co-evaluation              AM-PAC PT "6 Clicks" Mobility   Outcome Measure  Help needed turning from your back to your side while in a flat bed without using bedrails?: None Help needed moving from lying on your back to sitting on the side of a flat bed without using bedrails?: A Little Help needed moving to and from a bed to a chair (including a wheelchair)?: A Little Help needed standing up from a chair using your arms (e.g., wheelchair or bedside chair)?: A Lot Help needed to walk in hospital room?: A Little Help needed climbing 3-5 steps with a railing? : A Lot 6 Click Score: 17    End of Session Equipment Utilized During Treatment: Gait belt Activity Tolerance: Patient limited by fatigue Patient left: in bed;with call bell/phone within reach;with bed alarm set;with family/visitor present Nurse Communication: Mobility status PT Visit Diagnosis: Muscle weakness (generalized) (M62.81)     Time: 2707-8675 PT Time Calculation (min) (ACUTE ONLY): 23 min  Charges:  $Gait Training: 8-22 mins $Therapeutic Activity: 8-22 mins                     Quinn Bartling P., PTA Acute Rehabilitation Services Pager: [phone] Office: [place] 09/26/2020, 2:04 PM

## 2020-09-26 NOTE — Care Management Important Message (Signed)
Important Message  Patient Details  Name: Brett Williamson MRN: [id number] Date of Birth: Nov 14, 1940   Medicare Important Message Given:  Yes     Shelda Altes 09/26/2020, 9:52 AM

## 2020-09-26 NOTE — Progress Notes (Signed)
  Echocardiogram 2D Echocardiogram has been performed.  Ashden Sonnenberg G Crawford Tamura 09/26/2020, 9:05 AM

## 2020-09-26 NOTE — Progress Notes (Addendum)
[facility] VALVE TEAM  Patient Name: Brett Williamson Date of Encounter: 09/26/2020  Primary Cardiologist: Dr. Jesse Sans Problem List     Principal Problem:   S/P TAVR (transcatheter aortic valve replacement) Active Problems:   History of subdural hematoma   Essential hypertension   Controlled type 2 diabetes mellitus with hyperglycemia, without long-term current use of insulin (HCC)   Acute on chronic diastolic CHF (congestive heart failure) (HCC)   Elevated troponin   Physical deconditioning   Candidiasis of skin   Acute cystitis   Chronic stasis dermatitis   Chronic renal failure, stage 3b (HCC)   Subjective   Feeling better. Talking with cardiac rehab- getting heart failure education.   Inpatient Medications    Scheduled Meds: . aspirin EC  81 mg Oral Daily  . Chlorhexidine Gluconate Cloth  6 each Topical Daily  . clopidogrel  75 mg Oral Q breakfast  . insulin aspart  0-15 Units Subcutaneous TID WC  . insulin aspart  0-5 Units Subcutaneous QHS  . mouth rinse  15 mL Mouth Rinse BID  . nystatin   Topical TID  . rosuvastatin  20 mg Oral Daily  . sodium chloride flush  10-40 mL Intracatheter Q12H  . sodium chloride flush  3 mL Intravenous Q12H  . torsemide  40 mg Oral Daily   Continuous Infusions: . sodium chloride    . cefTRIAXone (ROCEPHIN)  IV 1 g (09/24/20 1348)   PRN Meds: sodium chloride, acetaminophen **OR** acetaminophen, Gerhardt's butt cream, guaiFENesin-dextromethorphan, morphine injection, ondansetron (ZOFRAN) IV, oxyCODONE, sodium chloride flush, sodium chloride flush, traMADol   Vital Signs    Vitals:   09/25/20 2322 09/26/20 0408 09/26/20 0645 09/26/20 0801  BP: 112/63 133/80  134/72  Pulse: 69 80  69  Resp: 20 20  17   Temp: 98.6 F (37 C) 98.2 F (36.8 C)  98.2 F (36.8 C)  TempSrc: Oral Oral  Oral  SpO2: 96% 95%  96%  Weight:   108.6 kg   Height:        Intake/Output Summary (Last 24  hours) at 09/26/2020 0917 Last data filed at 09/26/2020 0801 Gross per 24 hour  Intake 1874.26 ml  Output 1450 ml  Net 424.26 ml   Filed Weights   09/24/20 0408 09/25/20 0411 09/26/20 0645  Weight: 107.8 kg 107.6 kg 108.6 kg    Physical Exam   GEN: chronically ill appearing.  HEENT: Grossly normal.  Neck: Supple, no JVD, carotid bruits, or masses. Cardiac: RRR, no murmurs, rubs, or gallops. No clubbing, cyanosis, edema.   Respiratory:  Respirations regular and unlabored, clear to auscultation bilaterally. GI: Soft, nontender, nondistended, BS + x 4. MS: no deformity or atrophy. Skin: warm and dry, no rash.  Groin sites clear without hematoma or ecchymosis, eschar on toes and LE extremities with surrounding erythema Neuro:  Strength and sensation are intact. Psych: AAOx3.  Normal affect.  Labs    CBC Recent Labs    09/25/20 0410 09/25/20 1235 09/26/20 0427  WBC 9.1  --  9.6  HGB 14.7 15.3 14.1  HCT 48.0 45.0 45.8  MCV 78.4*  --  79.0*  PLT 222  --  591   Basic Metabolic Panel Recent Labs    09/24/20 0529 09/25/20 0410 09/25/20 1235 09/26/20 0427  NA 133* 132* 133*  --   K 3.9 3.8 3.9  --   CL 98 98 98  --   CO2 25 25  --   --  GLUCOSE 144* 128* 169*  --   BUN 37* 40* 43*  --   CREATININE 1.45* 1.50* 1.60*  --   CALCIUM 8.5* 8.5*  --   --   MG  --   --   --  1.9   Liver Function Tests No results for input(s): AST, ALT, ALKPHOS, BILITOT, PROT, ALBUMIN in the last 72 hours. No results for input(s): LIPASE, AMYLASE in the last 72 hours. Cardiac Enzymes No results for input(s): CKTOTAL, CKMB, CKMBINDEX, TROPONINI in the last 72 hours. BNP Invalid input(s): POCBNP D-Dimer No results for input(s): DDIMER in the last 72 hours. Hemoglobin A1C No results for input(s): HGBA1C in the last 72 hours. Fasting Lipid Panel No results for input(s): CHOL, HDL, LDLCALC, TRIG, CHOLHDL, LDLDIRECT in the last 72 hours. Thyroid Function Tests No results for input(s): TSH,  T4TOTAL, T3FREE, THYROIDAB in the last 72 hours.  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 EXAM: CHEST - 2 VIEW COMPARISON:  September 15, 2020. FINDINGS: Stable cardiomediastinal silhouette. No pneumothorax is noted. Small bilateral pleural effusions are noted. Minimal bibasilar subsegmental atelectasis is noted. Bony thorax is unremarkable. IMPRESSION: Small bilateral pleural effusions. Minimal bibasilar subsegmental atelectasis. Electronically Signed   By: Marijo Conception M.D.   On: 09/24/2020 14:21   ECHOCARDIOGRAM LIMITED  Result Date: 09/25/2020    ECHOCARDIOGRAM LIMITED REPORT   Patient Name:   Brett Williamson Date of Exam: 09/25/2020 Medical Rec #:  [id number]    Height:       71.0 in Accession #:    [id number]   Weight:       237.2 lb Date of Birth:  [birth year]     BSA:          2.267 m Patient Age:    80 years     BP:           140/72 mmHg Patient Gender: M            HR:           90 bpm. Exam Location:  Inpatient Procedure: Limited Echo, Color Doppler and Cardiac Doppler Indications:     Aortic Stenosis i35.0  History:         Patient has prior history of Echocardiogram examinations, most                  recent 09/12/2020. CHF; Risk Factors:Hypertension and Diabetes.                  Aortic Valve: 29 mm Edwards Sapien prosthetic, stented (TAVR)                  valve is present in the aortic position. Procedure Date:                  09/25/2020.  Sonographer:     Raquel Sarna Senior RDCS Referring Phys:  [id number] Eileen Stanford Diagnosing Phys: Sanda Klein MD  Sonographer Comments: 30mm Edwards Sapien 3 Ultra TAVR PRE-PROCEDURE FINDINGS Moderately-to-severely depressed left ventricular systolic function, estimated EF 30%. Probably trileaflet aortic valve. Moderate to severe calcific aortic  stenosis. No aortic insufficiency. Peak aortic gradient 108 mm Hg, mean gradient 84 mm Hg, dimensionless index 0.09, calculated valve area 0.3cm (  0.13 cm/m indexed for BSA). Acceleration time 116 ms. Mild mitral insufficiency. No pericardial effusion. POST-PROCEDURE FINDINGS Mildly reduced left ventricular systolic function, estimated EF 45%. Well seated 13mm Edwards Sapien 3 Ultra TAVR stent valve (TAVR). There is a trivial perivalvular leak. Peak aortic gradient 10 mm Hg, mean gradient 5 mm Hg, dimensionless index 0.56, calculated valve area 2.21 cm (0.97 cm/m indexed for BSA). Acceleration time 74 ms. Trivial mitral insufficiency. No pericardial effusion. IMPRESSIONS  1. Left ventricular ejection fraction, by estimation, is 40 to 45%. The left ventricle has mildly decreased function. The left ventricle demonstrates global hypokinesis. There is moderate concentric left ventricular hypertrophy.  2. Right ventricular systolic function is normal. The right ventricular size is normal.  3. Mild mitral valve regurgitation. Moderate mitral annular calcification.  4. The aortic valve has been repaired/replaced. There is a 29 mm Edwards Sapien prosthetic (TAVR) valve present in the aortic position. Procedure Date: 09/25/2020. Echo findings are consistent with normal structure and function of the aortic valve prosthesis. FINDINGS  Left Ventricle: Left ventricular ejection fraction, by estimation, is 40 to 45%. The left ventricle has mildly decreased function. The left ventricle demonstrates global hypokinesis. The left ventricular internal cavity size was normal in size. There is  moderate concentric left ventricular hypertrophy. Right Ventricle: The right ventricular size is normal. No increase in right ventricular wall thickness. Right ventricular systolic function is normal. Left Atrium: Left atrial size was normal in size. Right Atrium: Right atrial size was normal in size. Pericardium: There is no evidence of  pericardial effusion. Mitral Valve: There is mild thickening of the mitral valve leaflet(s). Moderate mitral annular calcification. Mild mitral valve regurgitation. Aortic Valve: The aortic valve has been repaired/replaced. Aortic valve mean gradient measures 5.0 mmHg. Aortic valve peak gradient measures 10.0 mmHg. Aortic valve area, by VTI measures 2.21 cm. There is a 29 mm Edwards Sapien prosthetic, stented (TAVR) valve present in the aortic position. Procedure Date: 09/25/2020. Echo findings are consistent with normal structure and function of the aortic valve prosthesis. Pulmonic Valve: The pulmonic valve was not assessed. Aorta: The aortic root is normal in size and structure. IAS/Shunts: The interatrial septum was not assessed. LEFT VENTRICLE PLAX 2D LVOT diam:     2.10 cm LV SV:         55 LV SV Index:   24 LVOT Area:     3.46 cm  AORTIC VALVE AV Area (Vmax):    1.93 cm AV Area (Vmean):   2.37 cm AV Area (VTI):     2.21 cm AV Vmax:           158.00 cm/s AV Vmean:          100.000 cm/s AV VTI:            0.249 m AV Peak Grad:      10.0 mmHg AV Mean Grad:      5.0 mmHg LVOT Vmax:         88.10 cm/s LVOT Vmean:        68.500 cm/s LVOT VTI:          0.159 m LVOT/AV VTI ratio: 0.64  SHUNTS Systemic VTI:  0.16 m Systemic Diam: 2.10 cm Sanda Klein MD Electronically signed by Sanda Klein MD Signature Date/Time: 09/25/2020/12:11:51 PM    Final    Structural Heart Procedure  Result Date: 09/25/2020 See surgical note for result.   Cardiac Studies   TAVR OPERATIVE NOTE   Date of Procedure:  09/25/2020  Preoperative Diagnosis:      Severe Aortic Stenosis   Postoperative Diagnosis:    Same   Procedure:        Transcatheter Aortic Valve Replacement - Percutaneous Transfemoral Approach             Edwards Sapien 3 THV (size 29 mm, model # 9600TFX, serial # [id number])              Co-Surgeons:                        Valentina Gu. Roxy Manns, MD and Sherren Mocha,  MD  Anesthesiologist:                  Albertha Ghee, MD  Echocardiographer:              Sanda Klein, MD  Pre-operative Echo Findings: ? Critical aortic stenosis ? Moderate left ventricular systolic dysfunction  Post-operative Echo Findings: ? trace paravalvular leak ? unchanged left ventricular systolic function   ________________________  Echo 09/26/20: completed but pending formal read. EF improved ~45-50%, mean gradient 9 mm hg. Personally reviewed.    Patient Profile     Rainier Feuerborn a 80 y.o.malewith a hx of chronic diastolic CHF, severe AS (by echo in 06/2020), subdural hematoma in 06/2020 secondary to mechanical fall (s/p craniotomy hematoma evacuation), HTN and Type 2 DMwhowas admittedto [facility]with acute CHF and found to have newly reduced EF 35-40%, RV dysfunction and critical AS. He was transferred to [facility] for further cardiac work up and evaluation by the structural heart team.  Assessment & Plan    Critical AS: s/p successful TAVR with a 29 mm Edwards Sapien 3 Ultra THV via the TF approach on 09/25/20. Post operative echo completed but pending formal read. Groin sites are stable. 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 There is no evidence of HAVB. Started on DAPT with Asprin and Plavix. Plan for PT/OT consult today with possible discharge home tomorrow.   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 He has had no progression since TAVR, but will plan to discharge with a Zio AT to monitor for delayed HAVB.  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 Net negative ~27L and neg ~19 lbs. Dobutamine discontinued post operatively. Coox 67%. Currently on Torsemide 40mg  daily. Spiro 12.5 mg daily added today.   AKI superimposed on CKD stage IIIB: creat peaked at 2.36.  Creat stable ~ 1.6 today, which is probably around his baseline.  Continue to monitor.   Bilateral groin candidal infection: this improved with oral fluconazole and nystatin powder.   LE/metatarsal wounds with surrounding erythema: on antibiotics to cover possible cellulitis. Pt says these are chronic.   Renal cell carcinoma: this was picked up as an incidental finding on pre TAVR CT and confirmed on follow up MRI. Will arrange for outpatient urology follow up.   Pancreatic lesion: incidental finding on MRI. This could be a small side branch IPMN or postinflammatory cystic lesion. Pancreatic protocol MRI with and without contrast is recommended in 2 years time. If the patient has interval cross-sectional imaging related to follow up from the renal cell carcinoma, then that might be a reasonable substitute for MR imaging. This will be discussed as an outpatient.   Possible cirrhosis: pre TAVR Ct showed suggestion of a finely irregular liver surface, cannot  exclude cirrhosis. Consider hepatic elastography for further liver fibrosis risk stratification, as clinically warranted. This will be discussed as an outpatient.   Traumatic subdural hematoma: 11/21 with craniotomy/evacuation.   SignedAngelena Form, PA-C  09/26/2020, 9:17 AM  Pager [phone]  Patient seen, examined. Available data reviewed. Agree with findings, assessment, and plan as outlined by Nell Range, PA-C.  The patient is independently interviewed and examined.  He is feeling well this morning.  On exam, he is alert, oriented, in no distress.  Lungs are clear.  Heart is regular rate and rhythm with a 2/6 ejection murmur best heard at the right upper sternal border, no diastolic murmur, bilateral groin sites are clear.  Extremities have no edema.  There are scabbed areas over the toes bilaterally.  There are some stasis changes in the lower legs.  Telemetry shows sinus rhythm with occasional PVCs and no evidence of high-grade AV block.  Overall the patient is progressing well following TAVR.  He  has had excellent diuresis under the care of the heart failure team leading up to his TAVR procedure.  Kidney function is improving.  He will have PT/OT assessment today.  We will review his echocardiogram when it is completed.  Anticipate discharge in the next 24 hours pending his clinical progress.  Sherren Mocha, M.D. 09/26/2020 1:20 PM

## 2020-09-27 LAB — CBC
HCT: 46.2 % (ref 39.0–52.0)
Hemoglobin: 14.3 g/dL (ref 13.0–17.0)
MCH: 24.4 pg — ABNORMAL LOW (ref 26.0–34.0)
MCHC: 31 g/dL (ref 30.0–36.0)
MCV: 78.8 fL — ABNORMAL LOW (ref 80.0–100.0)
Platelets: 184 10*3/uL (ref 150–400)
RBC: 5.86 MIL/uL — ABNORMAL HIGH (ref 4.22–5.81)
RDW: 16.3 % — ABNORMAL HIGH (ref 11.5–15.5)
WBC: 8.5 10*3/uL (ref 4.0–10.5)
nRBC: 0 % (ref 0.0–0.2)

## 2020-09-27 LAB — GLUCOSE, CAPILLARY
Glucose-Capillary: 120 mg/dL — ABNORMAL HIGH (ref 70–99)
Glucose-Capillary: 152 mg/dL — ABNORMAL HIGH (ref 70–99)
Glucose-Capillary: 144 mg/dL — ABNORMAL HIGH (ref 70–99)
Glucose-Capillary: 140 mg/dL — ABNORMAL HIGH (ref 70–99)

## 2020-09-27 LAB — BASIC METABOLIC PANEL
Anion gap: 11 (ref 5–15)
BUN: 43 mg/dL — ABNORMAL HIGH (ref 8–23)
CO2: 24 mmol/L (ref 22–32)
Calcium: 8.7 mg/dL — ABNORMAL LOW (ref 8.9–10.3)
Chloride: 96 mmol/L — ABNORMAL LOW (ref 98–111)
Creatinine, Ser: 1.64 mg/dL — ABNORMAL HIGH (ref 0.61–1.24)
GFR, Estimated: 42 mL/min — ABNORMAL LOW (ref >60)
Glucose, Bld: 135 mg/dL — ABNORMAL HIGH (ref 70–99)
Potassium: 3.8 mmol/L (ref 3.5–5.1)
Sodium: 131 mmol/L — ABNORMAL LOW (ref 135–145)

## 2020-09-27 LAB — COOXEMETRY PANEL
Carboxyhemoglobin: 1.3 % (ref 0.5–1.5)
Methemoglobin: 0.9 % (ref 0.0–1.5)
O2 Saturation: 69.9 %
Total hemoglobin: 14.4 g/dL (ref 12.0–16.0)

## 2020-09-27 NOTE — NC FL2 (Signed)
[place] LEVEL OF CARE SCREENING TOOL     IDENTIFICATION  Patient Name: Brett Williamson Birthdate: 1940-12-24 Sex: male Admission Date (Current Location): 09/11/2020  [place] and [place] Number:  [employer] and Address:  [facility], [place] [address], [place], [place] [address]      Provider Number: [id number]  Attending Physician Name and Address:  Sherren Mocha, MD  Relative Name and Phone Number:  [name], daughter [phone]    Current Level of Care: Hospital Recommended Level of Care: [place] Prior Approval Number:    Date Approved/Denied:   PASRR Number: [id number]  Discharge Plan: SNF    Current Diagnoses: Patient Active Problem List   Diagnosis Date Noted  . 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 Chronic kidney disease (CKD), stage III (moderate) (HCC)   . Severe aortic stenosis   . HTN (hypertension)   . Diabetes ([place])   . Chronic combined systolic and diastolic CHF (congestive heart failure) ([place])   . Morbid obesity ([place])   . RBBB   . Acute on chronic diastolic CHF (congestive heart failure) ([place]) 09/11/2020  . Elevated troponin 09/11/2020  . Physical deconditioning 09/11/2020  . Candidiasis of skin 09/11/2020  . Acute cystitis 09/11/2020  . 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 Chronic renal failure, stage 3b ([place]) 09/11/2020  . AKI (acute kidney injury) ([place])   . History of hypertension   . Seizure prophylaxis   . Controlled type 2 diabetes mellitus with hyperglycemia, without long-term current use of insulin ([place])   . Traumatic subdural hematoma ([place]) 06/25/2020  . Pressure injury of skin 06/21/2020  . History of subdural hematoma 06/19/2020  . Generalized weakness 06/19/2020  . Hyponatremia 06/19/2020  . Essential hypertension 06/19/2020  . Subdural bleeding ([place]) 06/19/2020  . Urinary tract infection in male   . Subdural hematoma ([place]) 06/2020     Orientation RESPIRATION BLADDER Height & Weight     Self,Time,Situation,Place  Normal Continent,External catheter Weight: 236 lb 15.9 oz (107.5 kg) Height:  5\' 11"  (180.3 cm)  BEHAVIORAL SYMPTOMS/MOOD NEUROLOGICAL BOWEL NUTRITION STATUS      Continent Diet (Please see DC Summary)  AMBULATORY STATUS COMMUNICATION OF NEEDS Skin   Extensive Assist Verbally Other (Comment) (Dermatitis on flank; leg wound (no dressing))                       Personal Care Assistance Level of Assistance  Bathing,Feeding,Dressing Bathing Assistance: Maximum assistance Feeding assistance: Maximum assistance Dressing Assistance: Maximum assistance     Functional Limitations Info             SPECIAL CARE FACTORS FREQUENCY  PT (By licensed PT),OT (By licensed OT)     PT Frequency: 5x/week OT Frequency: 5x/week            Contractures Contractures Info: Not present    Additional Factors Info  Code Status,Allergies Code Status Info: Full Allergies Info: NKA           Current Medications (09/27/2020):  This is the current hospital active medication list Current Facility-Administered Medications  Medication Dose Route Frequency Provider Last Rate Last Admin  . 0.9 %  sodium chloride infusion  250 mL Intravenous PRN Eileen Stanford, PA-C      . acetaminophen (TYLENOL) tablet 650 mg  650 mg Oral Q6H PRN Eileen Stanford, PA-C   650 mg at 09/26/20 1225   Or  . acetaminophen (TYLENOL) suppository  650 mg  650 mg Rectal Q6H PRN Eileen Stanford, PA-C      . aspirin EC tablet 81 mg  81 mg Oral Daily Eileen Stanford, PA-C   81 mg at 09/27/20 1002  . cefTRIAXone (ROCEPHIN) 1 g in sodium chloride 0.9 % 100 mL IVPB  1 g Intravenous Q24H Eileen Stanford, PA-C 200 mL/hr at 09/27/20 1317 1 g at 09/27/20 1317  . Chlorhexidine Gluconate Cloth 2 % PADS 6 each  6 each Topical Daily Eileen Stanford, PA-C   6 each at 09/26/20 0932  . clopidogrel (PLAVIX) tablet 75 mg  75 mg Oral Q  breakfast Eileen Stanford, PA-C   75 mg at 09/27/20 0756  . Gerhardt's butt cream   Topical PRN Eileen Stanford, PA-C   Given at 09/20/20 7253  . guaiFENesin-dextromethorphan (ROBITUSSIN DM) 100-10 MG/5ML syrup 10 mL  10 mL Oral Q4H PRN Eileen Stanford, PA-C   10 mL at 09/18/20 2226  . insulin aspart (novoLOG) injection 0-15 Units  0-15 Units Subcutaneous TID WC Eileen Stanford, PA-C   3 Units at 09/27/20 1207  . insulin aspart (novoLOG) injection 0-5 Units  0-5 Units Subcutaneous QHS Eileen Stanford, PA-C      . MEDLINE mouth rinse  15 mL Mouth Rinse BID Eileen Stanford, PA-C   15 mL at 09/26/20 2229  . morphine 2 MG/ML injection 1-4 mg  1-4 mg Intravenous Q1H PRN Eileen Stanford, PA-C      . nystatin (MYCOSTATIN/NYSTOP) topical powder   Topical TID Eileen Stanford, PA-C   Given at 09/27/20 1001  . ondansetron (ZOFRAN) injection 4 mg  4 mg Intravenous Q6H PRN Eileen Stanford, PA-C      . oxyCODONE (Oxy IR/ROXICODONE) immediate release tablet 5-10 mg  5-10 mg Oral Q3H PRN Eileen Stanford, PA-C   5 mg at 09/26/20 0650  . rosuvastatin (CRESTOR) tablet 20 mg  20 mg Oral Daily Eileen Stanford, PA-C   20 mg at 09/27/20 1002  . sodium chloride flush (NS) 0.9 % injection 10-40 mL  10-40 mL Intracatheter Q12H Eileen Stanford, PA-C   10 mL at 09/27/20 6644  . sodium chloride flush (NS) 0.9 % injection 10-40 mL  10-40 mL Intracatheter PRN Eileen Stanford, PA-C      . sodium chloride flush (NS) 0.9 % injection 3 mL  3 mL Intravenous Q12H Eileen Stanford, PA-C   3 mL at 09/26/20 2229  . sodium chloride flush (NS) 0.9 % injection 3 mL  3 mL Intravenous PRN Eileen Stanford, PA-C      . spironolactone (ALDACTONE) tablet 12.5 mg  12.5 mg Oral Daily Bensimhon, Shaune Pascal, MD   12.5 mg at 09/27/20 1002  . traMADol (ULTRAM) tablet 50-100 mg  50-100 mg Oral Q4H PRN Eileen Stanford, PA-C         Discharge Medications: Please see discharge summary for a  list of discharge medications.  Relevant Imaging Results:  Relevant Lab Results:   Additional Information SSN: [id number]. Not vaccinated  Benard Halsted, LCSW

## 2020-09-27 NOTE — Progress Notes (Signed)
Inpatient Rehabilitation Admissions Coordinator  I met briefly with patient at bedside with OT. I explained that a CIR bed is not available for him this week and that other rehab venues will have to be sought. He does feel that he is not physically ready to d/c home alone. His daughter can assist and he would like her involved in any discussions about his rehab options. I will alert TOC team of need to follow up with patient and daughter. We will sign off at this time.  Danne [place], RN, MSN Rehab Admissions Coordinator [phone] 09/27/2020 10:50 AM

## 2020-09-27 NOTE — Progress Notes (Addendum)
[facility] VALVE TEAM  Patient Name: Brett Williamson Date of Encounter: 09/27/2020  Primary Cardiologist: Dr. Jesse Sans Problem List     Principal Problem:   S/P TAVR (transcatheter aortic valve replacement) Active Problems:   History of subdural hematoma   Essential hypertension   Controlled type 2 diabetes mellitus with hyperglycemia, without long-term current use of insulin (HCC)   Acute on chronic diastolic CHF (congestive heart failure) (HCC)   Elevated troponin   Physical deconditioning   Candidiasis of skin   Acute cystitis   Chronic stasis dermatitis   Chronic renal failure, stage 3b ([place])   Subjective   Laying in bed. Breathing better. No complaints. Feels very weak  Inpatient Medications    Scheduled Meds: . aspirin EC  81 mg Oral Daily  . Chlorhexidine Gluconate Cloth  6 each Topical Daily  . clopidogrel  75 mg Oral Q breakfast  . insulin aspart  0-15 Units Subcutaneous TID WC  . insulin aspart  0-5 Units Subcutaneous QHS  . mouth rinse  15 mL Mouth Rinse BID  . nystatin   Topical TID  . rosuvastatin  20 mg Oral Daily  . sodium chloride flush  10-40 mL Intracatheter Q12H  . sodium chloride flush  3 mL Intravenous Q12H  . spironolactone  12.5 mg Oral Daily   Continuous Infusions: . sodium chloride    . cefTRIAXone (ROCEPHIN)  IV 1 g (09/26/20 1441)   PRN Meds: sodium chloride, acetaminophen **OR** acetaminophen, Gerhardt's butt cream, guaiFENesin-dextromethorphan, morphine injection, ondansetron (ZOFRAN) IV, oxyCODONE, sodium chloride flush, sodium chloride flush, traMADol   Vital Signs    Vitals:   09/26/20 2344 09/27/20 0418 09/27/20 0443 09/27/20 0839  BP: (!) 146/69 127/72  124/64  Pulse: 63 69  78  Resp: 20 20  12   Temp: 98 F (36.7 C) 97.9 F (36.6 C)  98.5 F (36.9 C)  TempSrc: Oral Oral  Oral  SpO2: 96% 94%  96%  Weight:   107.5 kg   Height:        Intake/Output Summary (Last 24  hours) at 09/27/2020 1035 Last data filed at 09/27/2020 0842 Gross per 24 hour  Intake 540 ml  Output 1951 ml  Net -1411 ml   Filed Weights   09/25/20 0411 09/26/20 0645 09/27/20 0443  Weight: 107.6 kg 108.6 kg 107.5 kg    Physical Exam   GEN: chronically ill appearing.  HEENT: Grossly normal.  Neck: Supple, no JVD, carotid bruits, or masses. Cardiac: RRR, no murmurs, rubs, or gallops. No clubbing, cyanosis, edema.   Respiratory:  Respirations regular and unlabored, clear to auscultation bilaterally. GI: Soft, nontender, nondistended, BS + x 4. MS: no deformity or atrophy. Skin: warm and dry, no rash.  Groin sites clear without hematoma or ecchymosis, eschar on toes and LE extremities with surrounding erythema. Chronic venous stasis changes Neuro:  Strength and sensation are intact. Psych: AAOx3.  Normal affect.  Labs    CBC Recent Labs    09/26/20 0427 09/27/20 0416  WBC 9.6 8.5  HGB 14.1 14.3  HCT 45.8 46.2  MCV 79.0* 78.8*  PLT 184 740   Basic Metabolic Panel Recent Labs    09/26/20 0427 09/26/20 0958 09/27/20 0416  NA  --  130* 131*  K  --  3.8 3.8  CL  --  98 96*  CO2  --  23 24  GLUCOSE  --  150* 135*  BUN  --  40* 43*  CREATININE  --  1.38* 1.64*  CALCIUM  --  7.8* 8.7*  MG 1.9  --   --    Liver Function Tests No results for input(s): AST, ALT, ALKPHOS, BILITOT, PROT, ALBUMIN in the last 72 hours. No results for input(s): LIPASE, AMYLASE in the last 72 hours. Cardiac Enzymes No results for input(s): CKTOTAL, CKMB, CKMBINDEX, TROPONINI in the last 72 hours. BNP Invalid input(s): POCBNP D-Dimer No results for input(s): DDIMER in the last 72 hours. Hemoglobin A1C No results for input(s): HGBA1C in the last 72 hours. Fasting Lipid Panel No results for input(s): CHOL, HDL, LDLCALC, TRIG, CHOLHDL, LDLDIRECT in the last 72 hours. Thyroid Function Tests No results for input(s): TSH, T4TOTAL, T3FREE, THYROIDAB in the last 72 hours.  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 Exam Location:  Inpatient Procedure: 2D Echo, Cardiac Doppler and Color Doppler Indications:    Post TAVR Evaluation V43.3/Z95.2  History:        Patient has prior history of Echocardiogram examinations, most                 recent 09/25/2020. CHF, Aortic Valve Disease, Arrythmias:RBBB;                 Risk Factors:Hypertension, Diabetes and Sleep Apnea. CKD.                 Aortic Valve: 29 mm Edwards Sapien prosthetic, stented (TAVR)                 valve is present in the aortic position. Procedure Date:                 09/25/2020.  Sonographer:    Jonelle Sidle Dance Referring Phys: [id number] [place]  1. Left ventricular ejection fraction, by estimation, is 40 to 45%. The left ventricle has mildly decreased function. The left ventricle demonstrates regional wall motion abnormalities (see scoring diagram/findings for description). There is mild concentric left ventricular hypertrophy. Left ventricular diastolic parameters are consistent with Grade II diastolic dysfunction (pseudonormalization). Elevated left atrial pressure. There is moderate hypokinesis of the left ventricular, entire lateral wall. There is moderate hypokinesis of the left ventricular, mid-apical anterior wall.  2. Right ventricular systolic function is normal. The right ventricular size is normal.  3. Left atrial size was severely dilated.  4. The mitral valve is degenerative. Mild mitral  valve regurgitation. Mild mitral stenosis. The mean mitral valve gradient is 3.6 mmHg with average heart rate of 73 bpm. Moderate mitral annular calcification.  5. The aortic valve  has been repaired/replaced. Aortic valve regurgitation is not visualized. There is a 29 mm Edwards Sapien prosthetic (TAVR) valve present in the aortic position. Procedure Date: 09/25/2020. Echo findings are consistent with normal structure and function of the aortic valve prosthesis. Aortic valve mean gradient measures 11.5 mmHg. Aortic valve Vmax measures 2.33 m/s. Acceleration time 67 ms. FINDINGS  Left Ventricle: Left ventricular ejection fraction, by estimation, is 40 to 45%. The left ventricle has mildly decreased function. The left ventricle demonstrates regional wall motion abnormalities. Moderate hypokinesis of the left ventricular, entire lateral wall. Moderate hypokinesis of the left ventricular, mid-apical anterior wall. The left ventricular internal cavity size was normal in size. There is mild concentric left ventricular hypertrophy. Left ventricular diastolic parameters are consistent with Grade II diastolic dysfunction (pseudonormalization). Elevated left atrial pressure. Right Ventricle: The right ventricular size is normal. No increase in right ventricular wall thickness. Right ventricular systolic function is normal. Left Atrium: Left atrial size was severely dilated. Right Atrium: Right atrial size was normal in size. Pericardium: There is no evidence of pericardial effusion. Mitral Valve: The mitral valve is degenerative in appearance. There is mild thickening of the mitral valve leaflet(s). Moderate mitral annular calcification. Mild mitral valve regurgitation. Mild mitral valve stenosis. The mean mitral valve gradient is 3.6 mmHg with average heart rate of 73 bpm. Tricuspid Valve: The tricuspid valve is normal in structure. Tricuspid valve regurgitation is not demonstrated. Aortic Valve: The aortic valve has been  repaired/replaced. Aortic valve regurgitation is not visualized. Aortic valve mean gradient measures 11.5 mmHg. Aortic valve peak gradient measures 21.6 mmHg. Aortic valve area, by VTI measures 1.25 cm. There is a  29 mm Edwards Sapien prosthetic, stented (TAVR) valve present in the aortic position. Procedure Date: 09/25/2020. Echo findings are consistent with normal structure and function of the aortic valve prosthesis. Acceleration time 67 ms. Pulmonic Valve: The pulmonic valve was normal in structure. Pulmonic valve regurgitation is trivial. Aorta: The aortic root and ascending aorta are structurally normal, with no evidence of dilitation. IAS/Shunts: No atrial level shunt detected by color flow Doppler.  LEFT VENTRICLE PLAX 2D LVIDd:         5.60 cm  Diastology LVIDs:         4.00 cm  LV e' medial:    4.57 cm/s LV PW:         1.36 cm  LV E/e' medial:  24.9 LV IVS:        1.20 cm  LV e' lateral:   5.66 cm/s LVOT diam:     2.10 cm  LV E/e' lateral: 20.1 LV SV:         54 LV SV Index:   24 LVOT Area:     3.46 cm  RIGHT VENTRICLE            IVC RV Basal diam:  3.30 cm    IVC diam: 2.20 cm RV S prime:     7.94 cm/s TAPSE (M-mode): 2.0 cm LEFT ATRIUM             Index       RIGHT ATRIUM           Index LA diam:        5.60 cm 2.46 cm/m  RA Area:     16.70 cm LA Vol (A2C):   83.1 ml 36.51 ml/m RA Volume:   45.80 ml  20.12 ml/m LA Vol (A4C):   83.6 ml 36.73 ml/m LA Biplane Vol: 88.5 ml  38.88 ml/m  AORTIC VALVE AV Area (Vmax):    1.18 cm AV Area (Vmean):   1.23 cm AV Area (VTI):     1.25 cm AV Vmax:           232.50 cm/s AV Vmean:          158.500 cm/s AV VTI:            0.429 m AV Peak Grad:      21.6 mmHg AV Mean Grad:      11.5 mmHg LVOT Vmax:         79.20 cm/s LVOT Vmean:        56.100 cm/s LVOT VTI:          0.155 m LVOT/AV VTI ratio: 0.36  AORTA Ao Root diam: 3.14 cm Ao Asc diam:  2.50 cm MITRAL VALVE MV Area (PHT): 2.29 cm     SHUNTS MV Area VTI:   1.68 cm     Systemic VTI:  0.16 m MV Mean grad:  3.6  mmHg     Systemic Diam: 2.10 cm MV VTI:        0.32 m MV Decel Time: 331 msec MV E velocity: 114.00 cm/s MV A velocity: 90.80 cm/s MV E/A ratio:  1.26 Mihai Croitoru MD Electronically signed by Sanda Klein MD Signature Date/Time: 09/26/2020/2:18:16 PM    Final    Structural Heart Procedure  Result Date: 09/25/2020 See surgical note for result.   Cardiac Studies   TAVR OPERATIVE NOTE   Date of Procedure:                09/25/2020  Preoperative Diagnosis:      Severe Aortic Stenosis   Postoperative Diagnosis:    Same   Procedure:        Transcatheter Aortic Valve Replacement - Percutaneous Transfemoral Approach             Edwards Sapien 3 THV (size 29 mm, model # 9600TFX, serial # [id number])              Co-Surgeons:                        Valentina Gu. Roxy Manns, MD and Sherren Mocha, MD  Anesthesiologist:                  Albertha Ghee, MD  Echocardiographer:              Sanda Klein, MD  Pre-operative Echo Findings: ? Critical aortic stenosis ? Moderate left ventricular systolic dysfunction  Post-operative Echo Findings: ? trace paravalvular leak ? unchanged left ventricular systolic function   ________________________  Echo 09/26/20: IMPRESSIONS  1. Left ventricular ejection fraction, by estimation, is 40 to 45%. The  left ventricle has mildly decreased function. The left ventricle  demonstrates regional wall motion abnormalities (see scoring  diagram/findings for description). There is mild  concentric left ventricular hypertrophy. Left ventricular diastolic  parameters are consistent with Grade II diastolic dysfunction  (pseudonormalization). Elevated left atrial pressure. There is moderate  hypokinesis of the left ventricular, entire lateral  wall. There is moderate hypokinesis of the left ventricular, mid-apical  anterior wall.  2. Right ventricular systolic function is normal. The right ventricular  size is normal.  3. Left atrial size was  severely dilated.  4. The mitral valve is degenerative. Mild mitral valve regurgitation.  Mild mitral stenosis. The mean mitral valve gradient is 3.6 mmHg with  average heart rate  of 73 bpm. Moderate mitral annular calcification.  5. The aortic valve has been repaired/replaced. Aortic valve  regurgitation is not visualized. There is a 29 mm Edwards Sapien  prosthetic (TAVR) valve present in the aortic position. Procedure Date:  09/25/2020. Echo findings are consistent with normal  structure and function of the aortic valve prosthesis. Aortic valve mean  gradient measures 11.5 mmHg. Aortic valve Vmax measures 2.33 m/s.  Acceleration time 67 ms.   Patient Profile     Brett Williamson a 80 y.o.malewith a hx of chronic diastolic CHF, severe AS (by echo in 06/2020), subdural hematoma in 06/2020 secondary to mechanical fall (s/p craniotomy hematoma evacuation), HTN and Type 2 DMwhowas admittedto [facility]22with acute CHF and found to have newly reduced EF 35-40%, RV dysfunction and critical AS. He was transferred to [facility] for further cardiac work up and evaluation by the structural heart team.  Assessment & Plan    Critical AS: s/p successful TAVR with a 29 mm Edwards Sapien 3 Ultra THV via the TF approach on 09/25/20. Post operative echo showed EF 40-45%, normally functioning TAVR with a mean gradient 11.20mm hg and no PVL. Groin sites are stable. 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 There is no evidence of HAVB. Started on DAPT with Asprin and Plavix.  PT/OT recommended home with home heath, but daughter refusing to take him home in this condition. Spoke to PT who thinks he would probably do best at a SNF and will change recommendation. Not a CIR candidate.   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 He has had no progression since TAVR, but will plan to discharge with a Zio AT to monitor for delayed HAVB.  NICM/Acute on chronic combined S/D CHF with biventricular  failure: he was diuresed with inotropic support under the care of the advanced CHF team. Net negative ~26L and neg ~19 lbs. Dobutamine discontinued post operatively. EF improved to 40-45% post operatively. Currently on Spiro 12.5mg  daily. Plan for PRN torsemide.   AKI superimposed on CKD stage IIIB: creat peaked at 2.36.  Creat stable ~ 1.6 today, which is probably around his baseline. Continue to monitor.   Bilateral groin candidal infection: this improved with oral fluconazole and nystatin powder.   LE/metatarsal wounds with surrounding erythema: on antibiotics to cover possible cellulitis. Pt says these are chronic.   Renal cell carcinoma: this was picked up as an incidental finding on pre TAVR CT and confirmed on follow up MRI. Will arrange for outpatient urology follow up.   Pancreatic lesion: incidental finding on MRI. This could be a small side branch IPMN or postinflammatory cystic lesion. Pancreatic protocol MRI with and without contrast is recommended in 2 years time. If the patient has interval cross-sectional imaging related to follow up from the renal cell carcinoma, then that might be a reasonable substitute for MR imaging. This will be discussed as an outpatient.   Possible cirrhosis: pre TAVR Ct showed suggestion of a finely irregular liver surface, cannot exclude cirrhosis. Consider hepatic elastography for further liver fibrosis risk stratification, as clinically warranted. This will be discussed as an outpatient.   Traumatic subdural hematoma: 11/21 with craniotomy/evacuation.   SignedAngelena Form, PA-C  09/27/2020, 10:35 AM  Pager [phone]  Patient seen, examined. Available data reviewed. Agree with findings, assessment, and plan as outlined by Nell Range, PA-C.  The patient is independently interviewed and examined.  Alert, oriented, in no distress.  He sitting up in a chair at  the bedside.  Lungs are clear, heart is regular rate and rhythm with a 2/6 systolic  murmur at the right upper sternal border, no diastolic murmur.  Abdomen is soft and nontender.  Extremities have trace edema and chronic stasis changes.  Patient's daughter is present.  We discussed his disposition at length.  He is not strong enough to go home and she is not able to take care of him.  There is no bed availability at [facility] inpatient rehab.  They understand that he will need to be discharged to short-term skilled nursing for rehabilitation.  Those discussions are beginning with case management.  Sherren Mocha, M.D. 09/27/2020 2:39 PM

## 2020-09-27 NOTE — Progress Notes (Signed)
Occupational Therapy Treatment Patient Details Name: Brett Williamson MRN: [id number] DOB: [birth year] Today's Date: 09/27/2020    History of present illness 80 yo male with who presented to the ER with worsening shortness of breath especially with short distances of ambulating. 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 PMH diastolic CHF, [id number], DMII, recent SDH (s/p right craniotomy 06/19/20), deconditioning (d/c from rehab on 07/05/20), HTN   OT comments  Pt continues to make slow progress toward his goals. Pt requiring min to mod assist at times(with the LE adls). Pt experienced dizziness when up today and was encouraged to sit in chair more during the day.  It was also stated that pt required assist feeding himself. Pt does much better feeding himself if he is sitting in the chair.  Encouraged him to ask nursing to get him up before each meal.  Do feel this pt needs rehab before returning home to maximize his independence with adls.  Feel pt might tolerate CIR level of therapy if sessions can be spaced apart; otherwise may need SNF.    Follow Up Recommendations  CIR;Supervision/Assistance - 24 hour    Equipment Recommendations  None recommended by OT    Recommendations for Other Services      Precautions / Restrictions Precautions Precautions: Fall Precaution Comments: Pt dizzy when changing positions. Encourged pt to get up and eat for every meal to encourage more out of bed activity and less in supine. Pt also able to feed self better when in chair. Restrictions Weight Bearing Restrictions: No       Mobility Bed Mobility Overal bed mobility: Needs Assistance Bed Mobility: Supine to Sit     Supine to sit: Min assist     General bed mobility comments: cues to roll to side to get started and pt pulled on therapist to achieve full sitting.  Transfers Overall transfer level: Needs assistance Equipment used: Rolling walker (2 wheeled) Transfers: Sit to/from [facility] Sit to Stand: Min  assist Stand pivot transfers: Min assist;From elevated surface       General transfer comment: min assist to get momentum to stand and min assist to steady self once up due to knees feeling weak.    Balance Overall balance assessment: Needs assistance Sitting-balance support: No upper extremity supported;Feet supported Sitting balance-Leahy Scale: Good     Standing balance support: Bilateral upper extremity supported;During functional activity Standing balance-Leahy Scale: Poor Standing balance comment: Pt must have walker or outside support to remain standing.                           ADL either performed or assessed with clinical judgement   ADL Overall ADL's : Needs assistance/impaired Eating/Feeding: Set up;Sitting Eating/Feeding Details (indicate cue type and reason): Pt must be in chair to feed himself Ily.  Pt can from bed but very difficult due to weak shoulders.  Got report from PT that pt had to have nurse feed him breakfast.  Evaled feeding and found he is much more independent feeding self from chair. Pt needs to be out of bed more so encouraged him and nursing to get him up for all meals. Grooming: Set up;Sitting Grooming Details (indicate cue type and reason): attempted to walk to sink but pt felt dizzy and requested to go to chair and not all the way to sink.             Lower Body Dressing: Moderate assistance;With adaptive equipment;Sit to/from stand;Cueing for compensatory techniques Lower  Body Dressing Details (indicate cue type and reason): Pt has sock aid and reacher at home. Pt cant reach feet with hands but unable to get a finger behind the sock to doff sock. Pt used reacher with peg on end. Toilet Transfer: Min Lobbyist Details (indicate cue type and reason): cues for hand placement. Pt did need bed raised up to achieve standing without physical assist. Toileting- Clothing Manipulation and Hygiene: Min guard        Functional mobility during ADLs: Rolling walker;Min guard;Cueing for sequencing General ADL Comments: feel pt has become dependent on his lift chair at home which makes sit to stand transfers without it difficult.  Discussed using lift portion only at night when fatigued but trying to get out of that chair without it at times as well.     Vision   Vision Assessment?: No apparent visual deficits   Perception     Praxis      Cognition Arousal/Alertness: Awake/alert Behavior During Therapy: WFL for tasks assessed/performed Overall Cognitive Status: History of cognitive impairments - at baseline                                 General Comments: pt has daughter than can help when he transitions back home but cannot live with him long term.        Exercises     Shoulder Instructions       General Comments Pt wtih arthritis that makes all adls difficult.  Do feel pt needs some outside rehab before returning home. Pt is not ready to return home alone.  Daughter can assist but not for the long term.    Pertinent Vitals/ Pain       Pain Assessment: No/denies pain Pain Score: 0-No pain  Home Living                                          Prior Functioning/Environment              Frequency  Min 2X/week        Progress Toward Goals  OT Goals(current goals can now be found in the care plan section)  Progress towards OT goals: Progressing toward goals  Acute Rehab OT Goals Patient Stated Goal: to have his valves replaced and get stronger OT Goal Formulation: With patient Time For Goal Achievement: 10/05/20 Potential to Achieve Goals: Good ADL Goals Pt Will Perform Lower Body Bathing: with modified independence;sit to/from stand;with adaptive equipment Pt Will Perform Lower Body Dressing: with modified independence;with adaptive equipment;sit to/from stand Pt Will Transfer to Toilet: with modified independence;ambulating Pt Will  Perform Toileting - Clothing Manipulation and hygiene: with modified independence;sit to/from stand Additional ADL Goal #1: Pt will identify 3 strategies to improve living with CHF Additional ADL Goal #2: TP will independently verblaize 3 strategies for energy conservation  Plan Discharge plan needs to be updated    Co-evaluation                 AM-PAC OT "6 Clicks" Daily Activity     Outcome Measure   Help from another person eating meals?: None Help from another person taking care of personal grooming?: A Little Help from another person toileting, which includes using toliet, bedpan, or urinal?: A Little Help from another person bathing (including washing,  rinsing, drying)?: A Little Help from another person to put on and taking off regular upper body clothing?: A Little Help from another person to put on and taking off regular lower body clothing?: A Lot 6 Click Score: 18    End of Session Equipment Utilized During Treatment: Rolling walker;Gait belt  OT Visit Diagnosis: Unsteadiness on feet (R26.81);Muscle weakness (generalized) (M62.81);Pain   Activity Tolerance Patient tolerated treatment well   Patient Left in chair;with call bell/phone within reach   Nurse Communication Mobility status        Time: 4585-9292 OT Time Calculation (min): 35 min  Charges: OT General Charges $OT Visit: 1 Visit OT Treatments $Self Care/Home Management : 23-37 mins   Glenford Peers 09/27/2020, 11:25 AM

## 2020-09-27 NOTE — Progress Notes (Addendum)
Physical Therapy Treatment Patient Details Name: Brett Williamson MRN: [id number] DOB: 1941/07/30 Today's Date: 09/27/2020    History of Present Illness 80 yo male with who presented to the ER with worsening shortness of breath especially with short distances of ambulating. 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 PMH diastolic CHF, [id number], DMII, recent SDH (s/p right craniotomy 06/19/20), deconditioning (d/c from rehab on 07/05/20), HTN    PT Comments    Pt received up in chair, agreeable to therapy session and with good participation and fair tolerance for mobility, limited due to fatigue after working with OT ~2hrs prior.  Pt performed seated BLE/BUE AROM therapeutic exercises with good tolerance but unable to perform standing exercises or gait due to reported severe fatigue after standing for BP assessment. Pt only able to tolerate standing ~1 minute at RW prior to needing to sit and needing increased assist (modA) to stand this date. Pt reports not feeling safe to DC home due to deconditioning and symptoms of dizziness and would benefit from low to moderate intensity post-acute rehab upon DC if agreeable. Pt continues to benefit from PT services to progress toward functional mobility goals, recommend vestibular consult next session due to possible R nystagmus and symptoms dizziness (not chronic per pt had remote episode vertigo ~40 years prior). Discharge recommendation updated below per discussion with pt and supervising PT Dawn W.  Follow Up Recommendations  SNF;Supervision/Assistance - 24 hour (if family instead chooses home, HHPT and likely WC level)     Equipment Recommendations  Wheelchair (measurements PT);Wheelchair cushion (measurements PT) (ramp for home entry if home)    Recommendations for Other Services       Precautions / Restrictions Precautions Precautions: Fall Precaution Comments: pt dizzy with positional change, could benefit from vestibular assessment as BP stable sit>stand; pt can feed himself if up  in chair but not in bed due to R RTC injury Restrictions Weight Bearing Restrictions: No    Mobility  Bed Mobility Overal bed mobility: Needs Assistance Bed Mobility: Supine to Sit     Supine to sit: Min assist     General bed mobility comments: up in chair pre/post    Transfers Overall transfer level: Needs assistance Equipment used: Rolling walker (2 wheeled) Transfers: Sit to/from Stand Sit to Stand: Mod assist Stand pivot transfers: Min assist;From elevated surface       General transfer comment: from chair height to RW needs increased time to rise and modA for steadying/trunk rise; painful/dizzy  Ambulation/Gait             General Gait Details: pt reports 8/10 RPE after sit<>stand transfer and unable to perform pre-gait/standing hip flexion due to fatigue   Stairs             Wheelchair Mobility    Modified Rankin (Stroke Patients Only)       Balance Overall balance assessment: Needs assistance Sitting-balance support: No upper extremity supported;Feet supported Sitting balance-Leahy Scale: Good     Standing balance support: Bilateral upper extremity supported;During functional activity Standing balance-Leahy Scale: Poor Standing balance comment: Pt must have walker and external support to remain standing.                            Cognition Arousal/Alertness: Awake/alert Behavior During Therapy: WFL for tasks assessed/performed Overall Cognitive Status: History of cognitive impairments - at baseline  General Comments: pt has daughter than can help when he transitions back home but cannot live with him long term.      Exercises Other Exercises Other Exercises: seated BLE AROM: hip flexion, LAQ, ankle pumps x10 reps ea, seated BUE AROM: rowing arm pumps x10 reps ea prior to standing; STS x1 rep for BLE strengthening and standing tolerance 1 minute for strengthening    General  Comments General comments (skin integrity, edema, etc.): possible mild R nystagmus, could benefit from vestibular assessment. BP 93/65 (75) seated prior to therex, BP 116/77 (90) after seated LE/UE therex; BP 132/70 standing; SpO2 WNL seated and poor pleth reading standing due to BP cuff; mild SOB only standing on RA      Pertinent Vitals/Pain Pain Assessment: Faces Pain Score: 0-No pain Faces Pain Scale: Hurts a little bit Pain Location: back and B knees (arthritis) Pain Descriptors / Indicators: Discomfort Pain Intervention(s): Monitored during session;Limited activity within patient's tolerance;Repositioned    Home Living                      Prior Function            PT Goals (current goals can now be found in the care plan section) Acute Rehab PT Goals Patient Stated Goal: to get stronger and go home PT Goal Formulation: With patient/family Time For Goal Achievement: 10/05/20 Potential to Achieve Goals: Fair Progress towards PT goals: Not progressing toward goals - comment (poor activity tolerance, fatigues quickly)    Frequency    Min 3X/week      PT Plan Discharge plan needs to be updated    Co-evaluation              AM-PAC PT "6 Clicks" Mobility   Outcome Measure  Help needed turning from your back to your side while in a flat bed without using bedrails?: None Help needed moving from lying on your back to sitting on the side of a flat bed without using bedrails?: A Little Help needed moving to and from a bed to a chair (including a wheelchair)?: A Little Help needed standing up from a chair using your arms (e.g., wheelchair or bedside chair)?: A Lot Help needed to walk in hospital room?: A Lot Help needed climbing 3-5 steps with a railing? : Total 6 Click Score: 15    End of Session Equipment Utilized During Treatment: Gait belt Activity Tolerance: Patient limited by fatigue Patient left: in chair;with call bell/phone within reach (pt does  not want legs reclined) Nurse Communication: Mobility status PT Visit Diagnosis: Muscle weakness (generalized) (M62.81)     Time: 7591-6384 PT Time Calculation (min) (ACUTE ONLY): 18 min  Charges:  $Therapeutic Exercise: 8-22 mins                     Blayne Frankie P., PTA Acute Rehabilitation Services Pager: [phone] Office: [place] 09/27/2020, 12:37 PM

## 2020-09-27 NOTE — Progress Notes (Signed)
Patient ID: Brett Williamson, male   DOB: [birth year], 80 y.o.   MRN: [id number]     Advanced Heart Failure Rounding Note  PCP-Cardiologist: No primary care provider on file.   Subjective:    2/15- S/P successful TAVR procedure on 2/15 with increase in EF from 30% to 45% post TAVR and with trivial perivalvular leak, Normal functioning aortic valve prosthesis    Off dobutamine. Co-ox 70% CVP 2  Denies SOB, orthopnea or PND. Feels weak   Objective:   Weight Range: 107.5 kg Body mass index is 33.05 kg/m.   Vital Signs:   Temp:  [97.6 F (36.4 C)-98.6 F (37 C)] 98.5 F (36.9 C) (02/17 0839) Pulse Rate:  [63-80] 78 (02/17 0839) Resp:  [12-20] 12 (02/17 0839) BP: (114-146)/(64-78) 124/64 (02/17 0839) SpO2:  [94 %-98 %] 96 % (02/17 0839) Weight:  [107.5 kg] 107.5 kg (02/17 0443) Last BM Date: 09/26/20  Weight change: Filed Weights   09/25/20 0411 09/26/20 0645 09/27/20 0443  Weight: 107.6 kg 108.6 kg 107.5 kg    Intake/Output:   Intake/Output Summary (Last 24 hours) at 09/27/2020 0857 Last data filed at 09/27/2020 0842 Gross per 24 hour  Intake 670 ml  Output 1951 ml  Net -1281 ml      Physical Exam    General:  Lying in bed  No resp difficulty HEENT: normal Neck: supple. no JVD. Carotids 2+ bilat; no bruits. No lymphadenopathy or thryomegaly appreciated. Cor: PMI nondisplaced. Regular rate & rhythm. No rubs, gallops or murmurs. Lungs: clear Abdomen: soft, nontender, nondistended. No hepatosplenomegaly. No bruits or masses. Good bowel sounds. Extremities: no cyanosis, clubbing, rash, edema  Some eschars on toes Neuro: alert & orientedx3, cranial nerves grossly intact. moves all 4 extremities w/o difficulty. Affect pleasant    Telemetry   NSR 70-80s + PVCs Personally reviewed   Labs    CBC Recent Labs    09/26/20 0427 09/27/20 0416  WBC 9.6 8.5  HGB 14.1 14.3  HCT 45.8 46.2  MCV 79.0* 78.8*  PLT 184 299   Basic Metabolic Panel Recent Labs     09/26/20 0427 09/26/20 0958 09/27/20 0416  NA  --  130* 131*  K  --  3.8 3.8  CL  --  98 96*  CO2  --  23 24  GLUCOSE  --  150* 135*  BUN  --  40* 43*  CREATININE  --  1.38* 1.64*  CALCIUM  --  7.8* 8.7*  MG 1.9  --   --    Liver Function Tests No results for input(s): AST, ALT, ALKPHOS, BILITOT, PROT, ALBUMIN in the last 72 hours. No results for input(s): LIPASE, AMYLASE in the last 72 hours. Cardiac Enzymes No results for input(s): CKTOTAL, CKMB, CKMBINDEX, TROPONINI in the last 72 hours.  BNP: BNP (last 3 results) Recent Labs    09/11/20 1600 09/12/20 0708  BNP 1,413.0* 1,561.0*    ProBNP (last 3 results) No results for input(s): PROBNP in the last 8760 hours.   D-Dimer No results for input(s): DDIMER in the last 72 hours. Hemoglobin A1C No results for input(s): HGBA1C in the last 72 hours. Fasting Lipid Panel No results for input(s): CHOL, HDL, LDLCALC, TRIG, CHOLHDL, LDLDIRECT in the last 72 hours. Thyroid Function Tests No results for input(s): TSH, T4TOTAL, T3FREE, THYROIDAB in the last 72 hours.  Invalid input(s): FREET3  Other results:  R/LHC results 09/18/20:   1. Diffuse mild non-obstructive CAD 2. Critical calcific aortic stenosis 3. Markedly elevated  filling pressure with normal cardiac output on dobutamine 4. 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 Exam Location:  Inpatient Procedure: 2D Echo, Cardiac Doppler and Color Doppler Indications:    Post TAVR Evaluation V43.3/Z95.2  History:        Patient has prior history of Echocardiogram examinations, most                  recent 09/25/2020. CHF, Aortic Valve Disease, Arrythmias:RBBB;                 Risk Factors:Hypertension, Diabetes and Sleep Apnea. CKD.                 Aortic Valve: 29 mm Edwards Sapien prosthetic, stented (TAVR)                 valve is present in the aortic position. Procedure Date:                 09/25/2020.  Sonographer:    Jonelle Sidle Dance Referring Phys: [id number] [place]  1. Left ventricular ejection fraction, by estimation, is 40 to 45%. The left ventricle has mildly decreased function. The left ventricle demonstrates regional wall motion abnormalities (see scoring diagram/findings for description). There is mild concentric left ventricular hypertrophy. Left ventricular diastolic parameters are consistent with Grade II diastolic dysfunction (pseudonormalization). Elevated left atrial pressure. There is moderate hypokinesis of the left ventricular, entire lateral wall. There is moderate hypokinesis of the left ventricular, mid-apical anterior wall.  2. Right ventricular systolic function is normal. The right ventricular size is normal.  3. Left atrial size was severely dilated.  4. The mitral valve is degenerative. Mild mitral valve regurgitation. Mild mitral stenosis. The mean mitral valve gradient is 3.6 mmHg with average heart rate of 73 bpm. Moderate mitral annular calcification.  5. The aortic valve has been repaired/replaced. Aortic valve regurgitation is not visualized. There is a 29 mm Edwards Sapien prosthetic (TAVR) valve present in the aortic position. Procedure Date: 09/25/2020. Echo findings are consistent with normal structure and function of the aortic valve prosthesis. Aortic valve mean gradient measures 11.5 mmHg. Aortic valve Vmax measures 2.33 m/s. Acceleration time 67 ms. FINDINGS  Left Ventricle: Left ventricular ejection fraction, by estimation, is 40 to 45%. The left ventricle has mildly decreased function. The left ventricle demonstrates regional  wall motion abnormalities. Moderate hypokinesis of the left ventricular, entire lateral wall. Moderate hypokinesis of the left ventricular, mid-apical anterior wall. The left ventricular internal cavity size was normal in size. There is mild concentric left ventricular hypertrophy. Left ventricular diastolic parameters are consistent with Grade II diastolic dysfunction (pseudonormalization). Elevated left atrial pressure. Right Ventricle: The right ventricular size is normal. No increase  in right ventricular wall thickness. Right ventricular systolic function is normal. Left Atrium: Left atrial size was severely dilated. Right Atrium: Right atrial size was normal in size. Pericardium: There is no evidence of pericardial effusion. Mitral Valve: The mitral valve is degenerative in appearance. There is mild thickening of the mitral valve leaflet(s). Moderate mitral annular calcification. Mild mitral valve regurgitation. Mild mitral valve stenosis. The mean mitral valve gradient is 3.6 mmHg with average heart rate of 73 bpm. Tricuspid Valve: The tricuspid valve is normal in structure. Tricuspid valve regurgitation is not demonstrated. Aortic Valve: The aortic valve has been repaired/replaced. Aortic valve regurgitation is not visualized. Aortic valve mean gradient measures 11.5 mmHg. Aortic valve peak gradient measures 21.6 mmHg. Aortic valve area, by VTI measures 1.25 cm. There is a  29 mm Edwards Sapien prosthetic, stented (TAVR) valve present in the aortic position. Procedure Date: 09/25/2020. Echo findings are consistent with normal structure and function of the aortic valve prosthesis. Acceleration time 67 ms. Pulmonic Valve: The pulmonic valve was normal in structure. Pulmonic valve regurgitation is trivial. Aorta: The aortic root and ascending aorta are structurally normal, with no evidence of dilitation. IAS/Shunts: No atrial level shunt detected by color flow Doppler.  LEFT VENTRICLE PLAX 2D LVIDd:          5.60 cm  Diastology LVIDs:         4.00 cm  LV e' medial:    4.57 cm/s LV PW:         1.36 cm  LV E/e' medial:  24.9 LV IVS:        1.20 cm  LV e' lateral:   5.66 cm/s LVOT diam:     2.10 cm  LV E/e' lateral: 20.1 LV SV:         54 LV SV Index:   24 LVOT Area:     3.46 cm  RIGHT VENTRICLE            IVC RV Basal diam:  3.30 cm    IVC diam: 2.20 cm RV S prime:     7.94 cm/s TAPSE (M-mode): 2.0 cm LEFT ATRIUM             Index       RIGHT ATRIUM           Index LA diam:        5.60 cm 2.46 cm/m  RA Area:     16.70 cm LA Vol (A2C):   83.1 ml 36.51 ml/m RA Volume:   45.80 ml  20.12 ml/m LA Vol (A4C):   83.6 ml 36.73 ml/m LA Biplane Vol: 88.5 ml 38.88 ml/m  AORTIC VALVE AV Area (Vmax):    1.18 cm AV Area (Vmean):   1.23 cm AV Area (VTI):     1.25 cm AV Vmax:           232.50 cm/s AV Vmean:          158.500 cm/s AV VTI:            0.429 m AV Peak Grad:      21.6 mmHg AV Mean Grad:      11.5 mmHg LVOT Vmax:         79.20 cm/s LVOT Vmean:        56.100 cm/s LVOT VTI:          0.155 m LVOT/AV VTI ratio: 0.36  AORTA Ao Root diam: 3.14 cm Ao Asc diam:  2.50 cm MITRAL VALVE MV Area (  PHT): 2.29 cm     SHUNTS MV Area VTI:   1.68 cm     Systemic VTI:  0.16 m MV Mean grad:  3.6 mmHg     Systemic Diam: 2.10 cm MV VTI:        0.32 m MV Decel Time: 331 msec MV E velocity: 114.00 cm/s MV A velocity: 90.80 cm/s MV E/A ratio:  1.26 Mihai Croitoru MD Electronically signed by Sanda Klein MD Signature Date/Time: 09/26/2020/2:18:16 PM    Final      Medications:     Scheduled Medications: . aspirin EC  81 mg Oral Daily  . Chlorhexidine Gluconate Cloth  6 each Topical Daily  . clopidogrel  75 mg Oral Q breakfast  . insulin aspart  0-15 Units Subcutaneous TID WC  . insulin aspart  0-5 Units Subcutaneous QHS  . mouth rinse  15 mL Mouth Rinse BID  . nystatin   Topical TID  . rosuvastatin  20 mg Oral Daily  . sodium chloride flush  10-40 mL Intracatheter Q12H  . sodium chloride flush  3 mL Intravenous Q12H  .  spironolactone  12.5 mg Oral Daily    Infusions: . sodium chloride    . cefTRIAXone (ROCEPHIN)  IV 1 g (09/26/20 1441)    PRN Medications: sodium chloride, acetaminophen **OR** acetaminophen, Gerhardt's butt cream, guaiFENesin-dextromethorphan, morphine injection, ondansetron (ZOFRAN) IV, oxyCODONE, sodium chloride flush, sodium chloride flush, traMADol    Assessment/Plan   1. 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  ECHO 09/12/20 with EF down to 35-40%, worsening of AVA to 0.24 cm2 and mean gradient now 65.7 mmHg, new RWMA, new severely reduced RV function.  Likely due to worsening AS He has diffuse non obstructive CAD by LHC on 09/18/20.     - co-ox 70%  - Off dobutamine with good coox. CVP 2  - EF 45-50% - Can add ARB in near future. No SGLT2i with severe groin yeast on admit  2. Severe Aortic Stenosis:  Critical stenosis, significantly progressed since 06/2020. By echo, worsening of AVA to 0.24 cm2 and mean gradient now 65.7 mmHg - R/LHC done on 2/8,  CT scans complete - s/p Successful TAVR 2/15,  - Echo 2/16 EF 45-50% with AVGm 68mmHG  3. AKI on CKD 3b: Baseline cr around 1.6. peaked at 2.36   - Creatinine stable 1.6 - Off dobutamine post TAVR procedure - CVP 2-3. No diuretics currently. restart torsemide as needed  4. 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 Hx of Traumatic subdural hematoma: 11/21 with craniotomy/evacuation.  He is on SCDs for now for DVT prophylaxis.   - continue low dose lovenox  6. Renal mass on CT/Irregular liver surface - MRI complete. 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 Iron deficiency - Received Feraheme 2/12  8. Deconditioning - this is major issue. Patient and wife do  not think they can go home safely - PT/OT recommending HHPT initially - ? Possible CIR candidate vs SNF  9. LE wounds with surrounding erythema -covering for cellulitis with IV ceftriaxone also rec'd vanc and cefuroxime yesterday for procedure   Length of Stay: [place], MD  09/27/2020, 8:57 AM  Advanced Heart Failure Team Pager [phone] (  M-F; 7a - 4p)  Please contact [facility] for night-coverage after hours (4p -7a ) and weekends on [email or web address]

## 2020-09-27 NOTE — TOC Initial Note (Addendum)
Transition of Care (TOC) - Initial/Assessment Note  Marvetta Gibbons RN, BSN Transitions of Care Unit 4E- RN Case Manager See Treatment Team for direct phone #    Patient Details  Name: Brett Williamson MRN: [id number] Date of Birth: 1941/07/24  Transition [facility] CM/SW Contact:    Dawayne Patricia, RN Phone Number: 09/27/2020, 3:10 PM  Clinical Narrative:                 Pt s/p TAVR, HF team following, from home alone. Cm spoke with pt at bedside regarding transition plan. Per pt he has needed DME at home, including several walkers, rollator, shower chair, handicapped bathroom. Pt reports that he and his daughter have been discussing plan to return home with her and family coming to stay with him to assist, however daughter also thinking about rehab as she is concerned pt not quite ready to return home independently. Pt voiced that he would be agreeable to this if daughter feels it is best. Per pt he has used [facility] in the past would like to use them again if needed- list provided Per CMS guidelines from medicare.gov website with star ratings (copy placed in shadow chart). Pt request for this writer to reach out to daughter to discuss.  Call made to daughter who voiced concerns about pt being weak and not at baseline. She states she is on her way here and will speak with her dad about the options. This Probation officer will speak with them later this afternoon.   1430- daughter at bedside- discussed in follow up what her and patient had decided on for transition plan. Per pt and daughter they feel like a short stay in SNF would be best plan before return home to gain strength and then family can plan to stay with him on return home.  Pt only has Medicare part A- this might be a barrier- pt and daughter aware. Per daughter their first choice would be [facility], second [facility]. Are open to others in [facility], but not [facility]. Will reach out and start bed search to see if any will  accept. CSW to f/u with pt and daughter.   Expected Discharge Plan: [place] Barriers to Discharge: SNF Pending bed offer   Patient Goals and CMS Choice Patient states their goals for this hospitalization and ongoing recovery are:: return home after rehab stay CMS Medicare.gov Compare Post Acute Care list provided to:: Patient Choice offered to / list presented to : [place]  Expected Discharge Plan and Services Expected Discharge Plan: [place] In-house Referral: Clinical Social Work Discharge Planning Services: CM Consult Post Acute Care Choice: [place] arrangements for the past 2 months: [place]: [facility] [place]        Prior Living Arrangements/Services Living arrangements for the past 2 months: Apartment Lives with:: Self Patient language and need for interpreter reviewed:: Yes Do you feel safe going back to the place where you live?: Yes      Need for Family Participation in Patient Care: Yes (Comment) Care giver support system in place?: Yes (comment) Current home services: DME (has rw, rollator, shower chair, raised toilet seat) Criminal Activity/Legal Involvement Pertinent to Current Situation/Hospitalization: No - Comment as needed  Activities of Daily [place]  Devices/Equipment: Gilford Rile (specify type),Grab bars in shower ADL Screening (condition at time of admission) Patient's cognitive ability adequate to safely complete daily activities?: No Is the patient deaf or have difficulty hearing?: No Does the patient have difficulty seeing, even when wearing glasses/contacts?: Yes Does the patient have difficulty concentrating, remembering, or making decisions?: Yes Patient able to express need for assistance with ADLs?: Yes Does the patient have difficulty dressing or bathing?: Yes Independently performs ADLs?: No Communication: Needs  assistance Is this a change from baseline?: Pre-admission baseline Dressing (OT): Needs assistance Is this a change from baseline?: Pre-admission baseline Grooming: Needs assistance Is this a change from baseline?: Pre-admission baseline Feeding: Needs assistance Is this a change from baseline?: Pre-admission baseline Bathing: Needs assistance Is this a change from baseline?: Pre-admission baseline Toileting: Needs assistance Is this a change from baseline?: Pre-admission baseline In/Out Bed: Needs assistance Is this a change from baseline?: Pre-admission baseline Walks in Home: Needs assistance Is this a change from baseline?: Pre-admission baseline Does the patient have difficulty walking or climbing stairs?: Yes Weakness of Legs: Both Weakness of Arms/Hands: Both  Permission Sought/Granted Permission sought to share information with : Facility Art therapist granted to share information with : Yes, Verbal Permission Granted  Share Information with NAME: Jerrilyn [place]  Permission granted to share info w AGENCY: SNF  Permission granted to share info w Relationship: daughter  Permission granted to share info w Contact Information: [phone]  Emotional Assessment Appearance:: Appears stated age Attitude/Demeanor/Rapport: Engaged Affect (typically observed): Appropriate Orientation: : Oriented to Self,Oriented to Place,Oriented to  Time,Oriented to Situation Alcohol / Substance Use: Not Applicable Psych Involvement: No (comment)  Admission diagnosis:  Intertrigo [L30.4] Peripheral edema [R60.9] Acute on chronic diastolic CHF (congestive heart failure) (HCC) [I50.33] Acute congestive heart failure, unspecified heart failure type [facility]) [I50.9] Patient Active Problem List   Diagnosis Date Noted  . 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 Chronic kidney disease (CKD), stage III (moderate) (HCC)   . Severe aortic stenosis   . HTN  (hypertension)   . Diabetes ([place])   . Chronic combined systolic and diastolic CHF (congestive heart failure) ([place])   . Morbid obesity ([place])   . RBBB   . Acute on chronic diastolic CHF (congestive heart failure) ([place]) 09/11/2020  . Elevated troponin 09/11/2020  . Physical deconditioning 09/11/2020  . Candidiasis of skin 09/11/2020  . Acute cystitis 09/11/2020  . 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 Chronic renal failure, stage 3b ([place]) 09/11/2020  . AKI (acute kidney injury) ([place])   . History of hypertension   . Seizure prophylaxis   . Controlled type 2 diabetes mellitus with hyperglycemia, without long-term current use of insulin ([place])   . Traumatic subdural hematoma ([place]) 06/25/2020  . Pressure injury of skin 06/21/2020  . History of subdural hematoma 06/19/2020  . Generalized weakness 06/19/2020  . Hyponatremia 06/19/2020  . Essential hypertension 06/19/2020  . Subdural bleeding ([place]) 06/19/2020  . Urinary tract infection in male   . Subdural hematoma ([place]) 06/2020   PCP:  Neale Burly, MD Pharmacy:   [facility] [place] - [address] AT [address] [place] [address] Phone: [phone] Fax: [phone]     Social Determinants of Health (SDOH) Interventions    Readmission Risk Interventions No flowsheet data found.

## 2020-09-27 NOTE — Progress Notes (Signed)
Mobility Specialist: Progress Note   09/27/20 1649  Mobility  Activity Refused mobility   Pt refused mobility stating he had just got back to the bed from the chair. Pt said he was feeling light headed, RN notified. Will f/u tomorrow.   [facility] Jamy Whyte Mobility Specialist Mobility Specialist Phone: [phone]

## 2020-09-28 LAB — CBC
HCT: 42.6 % (ref 39.0–52.0)
Hemoglobin: 13.7 g/dL (ref 13.0–17.0)
MCH: 24.9 pg — ABNORMAL LOW (ref 26.0–34.0)
MCHC: 32.2 g/dL (ref 30.0–36.0)
MCV: 77.3 fL — ABNORMAL LOW (ref 80.0–100.0)
Platelets: 178 10*3/uL (ref 150–400)
RBC: 5.51 MIL/uL (ref 4.22–5.81)
RDW: 16.3 % — ABNORMAL HIGH (ref 11.5–15.5)
WBC: 8.4 10*3/uL (ref 4.0–10.5)
nRBC: 0 % (ref 0.0–0.2)

## 2020-09-28 LAB — URINALYSIS, ROUTINE W REFLEX MICROSCOPIC
Bacteria, UA: NONE SEEN
Bilirubin Urine: NEGATIVE
Glucose, UA: NEGATIVE mg/dL
Ketones, ur: NEGATIVE mg/dL
Leukocytes,Ua: NEGATIVE
Nitrite: NEGATIVE
Protein, ur: NEGATIVE mg/dL
Specific Gravity, Urine: 1.01 (ref 1.005–1.030)
pH: 6 (ref 5.0–8.0)

## 2020-09-28 LAB — BASIC METABOLIC PANEL
Anion gap: 10 (ref 5–15)
BUN: 42 mg/dL — ABNORMAL HIGH (ref 8–23)
CO2: 25 mmol/L (ref 22–32)
Calcium: 8.4 mg/dL — ABNORMAL LOW (ref 8.9–10.3)
Chloride: 96 mmol/L — ABNORMAL LOW (ref 98–111)
Creatinine, Ser: 1.44 mg/dL — ABNORMAL HIGH (ref 0.61–1.24)
GFR, Estimated: 49 mL/min — ABNORMAL LOW (ref >60)
Glucose, Bld: 119 mg/dL — ABNORMAL HIGH (ref 70–99)
Potassium: 3.8 mmol/L (ref 3.5–5.1)
Sodium: 131 mmol/L — ABNORMAL LOW (ref 135–145)

## 2020-09-28 LAB — SARS CORONAVIRUS 2 (TAT 6-24 HRS): SARS Coronavirus 2: NEGATIVE

## 2020-09-28 LAB — COOXEMETRY PANEL
Carboxyhemoglobin: 1.2 % (ref 0.5–1.5)
Methemoglobin: 0.6 % (ref 0.0–1.5)
O2 Saturation: 68 %
Total hemoglobin: 14.2 g/dL (ref 12.0–16.0)

## 2020-09-28 LAB — GLUCOSE, CAPILLARY
Glucose-Capillary: 121 mg/dL — ABNORMAL HIGH (ref 70–99)
Glucose-Capillary: 129 mg/dL — ABNORMAL HIGH (ref 70–99)
Glucose-Capillary: 139 mg/dL — ABNORMAL HIGH (ref 70–99)
Glucose-Capillary: 172 mg/dL — ABNORMAL HIGH (ref 70–99)

## 2020-09-28 MED ORDER — SODIUM CHLORIDE 0.9 % IV BOLUS
1000.0000 mL | Freq: Once | INTRAVENOUS | Status: AC
  Administered 2020-09-28: 1000 mL via INTRAVENOUS

## 2020-09-28 NOTE — TOC Progression Note (Addendum)
Transition of Care [facility]) - Progression Note    Patient Details  Name: Brett Williamson MRN: [id number] Date of Birth: 08/27/1940  Transition of Care [facility]) CM/SW [place], [place] Phone Number: 09/28/2020, 1:48 PM  Clinical Narrative:     CSW spoke with patient's daughter- she has accepted bed offer at [facility]. [facility] has confirmed bed offer. CSW anticipate discharge tomorrow form TOC stand point.   CSW requested covid.  CSW will continue to follow and assist with discharge planning.   Expected Discharge Plan: [place] Barriers to Discharge: SNF Pending bed offer  Expected Discharge Plan and Services Expected Discharge Plan: [place] In-house Referral: Clinical Social Work Discharge Planning Services: CM Consult Post Acute Care Choice: [place] arrangements for the past 2 months: [place]: [facility] [place] Determinants of Health (SDOH) Interventions    Readmission Risk Interventions No flowsheet data found.

## 2020-09-28 NOTE — Progress Notes (Addendum)
[place] VALVE TEAM  Patient Name: Brett Williamson Date of Encounter: 09/28/2020  Primary Cardiologist: Dr. Jesse Sans Problem List     Principal Problem:   S/P TAVR (transcatheter aortic valve replacement) Active Problems:   History of subdural hematoma   Essential hypertension   Controlled type 2 diabetes mellitus with hyperglycemia, without long-term current use of insulin (HCC)   Acute on chronic diastolic CHF (congestive heart failure) (HCC)   Elevated troponin   Physical deconditioning   Candidiasis of skin   Acute cystitis   Chronic stasis dermatitis   Chronic renal failure, stage 3b ([place])   Subjective   Has had some dizziness when up moving around. Has some burning at tip of penis. Eager to get out of hospital   Inpatient Medications    Scheduled Meds: . aspirin EC  81 mg Oral Daily  . Chlorhexidine Gluconate Cloth  6 each Topical Daily  . clopidogrel  75 mg Oral Q breakfast  . insulin aspart  0-15 Units Subcutaneous TID WC  . insulin aspart  0-5 Units Subcutaneous QHS  . mouth rinse  15 mL Mouth Rinse BID  . nystatin   Topical TID  . rosuvastatin  20 mg Oral Daily  . sodium chloride flush  10-40 mL Intracatheter Q12H  . sodium chloride flush  3 mL Intravenous Q12H  . spironolactone  12.5 mg Oral Daily   Continuous Infusions: . sodium chloride    . cefTRIAXone (ROCEPHIN)  IV 1 g (09/27/20 1317)   PRN Meds: sodium chloride, acetaminophen **OR** acetaminophen, Gerhardt's butt cream, guaiFENesin-dextromethorphan, morphine injection, ondansetron (ZOFRAN) IV, oxyCODONE, sodium chloride flush, sodium chloride flush, traMADol   Vital Signs    Vitals:   09/28/20 0500 09/28/20 0556 09/28/20 0740 09/28/20 0750  BP:  133/72 134/74 (!) 147/90  Pulse:  71 98 86  Resp:  13 13 12   Temp:  97.7 F (36.5 C) 98.2 F (36.8 C) (!) 97.5 F (36.4 C)  TempSrc:  Oral Oral Oral  SpO2:  94% 98% 93%  Weight: 106.5 kg      Height:        Intake/Output Summary (Last 24 hours) at 09/28/2020 0839 Last data filed at 09/28/2020 0556 Gross per 24 hour  Intake 460 ml  Output 2400 ml  Net -1940 ml   Filed Weights   09/26/20 0645 09/27/20 0443 09/28/20 0500  Weight: 108.6 kg 107.5 kg 106.5 kg    Physical Exam   GEN: chronically ill appearing.  HEENT: Grossly normal.  Neck: Supple, no JVD, carotid bruits, or masses. Cardiac: RRR, no murmurs, rubs, or gallops. No clubbing, cyanosis, edema.   Respiratory:  Respirations regular and unlabored, clear to auscultation bilaterally. GI: Soft, nontender, nondistended, BS + x 4. MS: no deformity or atrophy. Skin: warm and dry, no rash.  Groin sites clear without hematoma or ecchymosis, eschar on toes and LE extremities with surrounding erythema. Chronic venous stasis changes Neuro:  Strength and sensation are intact. Psych: AAOx3.  Normal affect.  Labs    CBC Recent Labs    09/27/20 0416 09/28/20 0500  WBC 8.5 8.4  HGB 14.3 13.7  HCT 46.2 42.6  MCV 78.8* 77.3*  PLT 184 182   Basic Metabolic Panel Recent Labs    09/26/20 0427 09/26/20 0958 09/27/20 0416 09/28/20 0500  NA  --    < > 131* 131*  K  --    < > 3.8 3.8  CL  --    < >  96* 96*  CO2  --    < > 24 25  GLUCOSE  --    < > 135* 119*  BUN  --    < > 43* 42*  CREATININE  --    < > 1.64* 1.44*  CALCIUM  --    < > 8.7* 8.4*  MG 1.9  --   --   --    < > = values in this interval not displayed.   Liver Function Tests No results for input(s): AST, ALT, ALKPHOS, BILITOT, PROT, ALBUMIN in the last 72 hours. No results for input(s): LIPASE, AMYLASE in the last 72 hours. Cardiac Enzymes No results for input(s): CKTOTAL, CKMB, CKMBINDEX, TROPONINI in the last 72 hours. BNP Invalid input(s): POCBNP D-Dimer No results for input(s): DDIMER in the last 72 hours. Hemoglobin A1C No results for input(s): HGBA1C in the last 72 hours. Fasting Lipid Panel No results for input(s): CHOL, HDL, LDLCALC,  TRIG, CHOLHDL, LDLDIRECT in the last 72 hours. Thyroid Function Tests No results for input(s): TSH, T4TOTAL, T3FREE, THYROIDAB in the last 72 hours.  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 Exam Location:  Inpatient Procedure: 2D Echo, Cardiac Doppler and Color Doppler Indications:    Post TAVR Evaluation V43.3/Z95.2  History:        Patient has prior history of Echocardiogram examinations, most                 recent 09/25/2020. CHF, Aortic Valve Disease, Arrythmias:RBBB;                 Risk Factors:Hypertension, Diabetes and Sleep Apnea. CKD.                 Aortic Valve: 29 mm Edwards Sapien prosthetic, stented (TAVR)                 valve is present in the aortic position. Procedure Date:                 09/25/2020.  Sonographer:    Jonelle Sidle Dance Referring Phys: [id number]  1. Left ventricular ejection fraction, by estimation, is 40 to 45%. The left ventricle has mildly decreased function. The left ventricle demonstrates regional wall motion abnormalities (see scoring diagram/findings for description). There is mild concentric left ventricular hypertrophy. Left ventricular diastolic parameters are consistent with Grade II diastolic dysfunction (pseudonormalization). Elevated left atrial pressure. There is moderate hypokinesis of the left ventricular, entire lateral wall. There is moderate hypokinesis of the left ventricular, mid-apical anterior wall.  2. Right  ventricular systolic  function is normal. The right ventricular size is normal.  3. Left atrial size was severely dilated.  4. The mitral valve is degenerative. Mild mitral valve regurgitation. Mild mitral stenosis. The mean mitral valve gradient is 3.6 mmHg with average heart rate of 73 bpm. Moderate mitral annular calcification.  5. The aortic valve has been repaired/replaced. Aortic valve regurgitation is not visualized. There is a 29 mm Edwards Sapien prosthetic (TAVR) valve present in the aortic position. Procedure Date: 09/25/2020. Echo findings are consistent with normal structure and function of the aortic valve prosthesis. Aortic valve mean gradient measures 11.5 mmHg. Aortic valve Vmax measures 2.33 m/s. Acceleration time 67 ms. FINDINGS  Left Ventricle: Left ventricular ejection fraction, by estimation, is 40 to 45%. The left ventricle has mildly decreased function. The left ventricle demonstrates regional wall motion abnormalities. Moderate hypokinesis of the left ventricular, entire lateral wall. Moderate hypokinesis of the left ventricular, mid-apical anterior wall. The left ventricular internal cavity size was normal in size. There is mild concentric left ventricular hypertrophy. Left ventricular diastolic parameters are consistent with Grade II diastolic dysfunction (pseudonormalization). Elevated left atrial pressure. Right Ventricle: The right ventricular size is normal. No increase in right ventricular wall thickness. Right ventricular systolic function is normal. Left Atrium: Left atrial size was severely dilated. Right Atrium: Right atrial size was normal in size. Pericardium: There is no evidence of pericardial effusion. Mitral Valve: The mitral valve is degenerative in appearance. There is mild thickening of the mitral valve leaflet(s). Moderate mitral annular calcification. Mild mitral valve regurgitation. Mild mitral valve stenosis. The mean mitral valve gradient is 3.6 mmHg with average  heart rate of 73 bpm. Tricuspid Valve: The tricuspid valve is normal in structure. Tricuspid valve regurgitation is not demonstrated. Aortic Valve: The aortic valve has been repaired/replaced. Aortic valve regurgitation is not visualized. Aortic valve mean gradient measures 11.5 mmHg. Aortic valve peak gradient measures 21.6 mmHg. Aortic valve area, by VTI measures 1.25 cm. There is a  29 mm Edwards Sapien prosthetic, stented (TAVR) valve present in the aortic position. Procedure Date: 09/25/2020. Echo findings are consistent with normal structure and function of the aortic valve prosthesis. Acceleration time 67 ms. Pulmonic Valve: The pulmonic valve was normal in structure. Pulmonic valve regurgitation is trivial. Aorta: The aortic root and ascending aorta are structurally normal, with no evidence of dilitation. IAS/Shunts: No atrial level shunt detected by color flow Doppler.  LEFT VENTRICLE PLAX 2D LVIDd:         5.60 cm  Diastology LVIDs:         4.00 cm  LV e' medial:    4.57 cm/s LV PW:         1.36 cm  LV E/e' medial:  24.9 LV IVS:        1.20 cm  LV e' lateral:   5.66 cm/s LVOT diam:     2.10 cm  LV E/e' lateral: 20.1 LV SV:         54 LV SV Index:   24 LVOT Area:     3.46 cm  RIGHT VENTRICLE            IVC RV Basal diam:  3.30 cm    IVC diam: 2.20 cm RV S prime:     7.94 cm/s TAPSE (M-mode): 2.0 cm LEFT ATRIUM             Index       RIGHT ATRIUM  Index LA diam:        5.60 cm 2.46 cm/m  RA Area:     16.70 cm LA Vol (A2C):   83.1 ml 36.51 ml/m RA Volume:   45.80 ml  20.12 ml/m LA Vol (A4C):   83.6 ml 36.73 ml/m LA Biplane Vol: 88.5 ml 38.88 ml/m  AORTIC VALVE AV Area (Vmax):    1.18 cm AV Area (Vmean):   1.23 cm AV Area (VTI):     1.25 cm AV Vmax:           232.50 cm/s AV Vmean:          158.500 cm/s AV VTI:            0.429 m AV Peak Grad:      21.6 mmHg AV Mean Grad:      11.5 mmHg LVOT Vmax:         79.20 cm/s LVOT Vmean:        56.100 cm/s LVOT VTI:          0.155 m LVOT/AV VTI  ratio: 0.36  AORTA Ao Root diam: 3.14 cm Ao Asc diam:  2.50 cm MITRAL VALVE MV Area (PHT): 2.29 cm     SHUNTS MV Area VTI:   1.68 cm     Systemic VTI:  0.16 m MV Mean grad:  3.6 mmHg     Systemic Diam: 2.10 cm MV VTI:        0.32 m MV Decel Time: 331 msec MV E velocity: 114.00 cm/s MV A velocity: 90.80 cm/s MV E/A ratio:  1.26 Mihai Croitoru MD Electronically signed by Sanda Klein MD Signature Date/Time: 09/26/2020/2:18:16 PM    Final     Cardiac Studies   TAVR OPERATIVE NOTE   Date of Procedure:                09/25/2020  Preoperative Diagnosis:      Severe Aortic Stenosis   Postoperative Diagnosis:    Same   Procedure:        Transcatheter Aortic Valve Replacement - Percutaneous Transfemoral Approach             Edwards Sapien 3 THV (size 29 mm, model # 9600TFX, serial # [id number])              Co-Surgeons:                        Valentina Gu. Roxy Manns, MD and Sherren Mocha, MD  Anesthesiologist:                  Albertha Ghee, MD  Echocardiographer:              Sanda Klein, MD  Pre-operative Echo Findings: ? Critical aortic stenosis ? Moderate left ventricular systolic dysfunction  Post-operative Echo Findings: ? trace paravalvular leak ? unchanged left ventricular systolic function   ________________________  Echo 09/26/20: IMPRESSIONS  1. Left ventricular ejection fraction, by estimation, is 40 to 45%. The  left ventricle has mildly decreased function. The left ventricle  demonstrates regional wall motion abnormalities (see scoring  diagram/findings for description). There is mild  concentric left ventricular hypertrophy. Left ventricular diastolic  parameters are consistent with Grade II diastolic dysfunction  (pseudonormalization). Elevated left atrial pressure. There is moderate  hypokinesis of the left ventricular, entire lateral  wall. There is moderate hypokinesis of the left ventricular, mid-apical  anterior wall.  2. Right ventricular  systolic function is normal. The  right ventricular  size is normal.  3. Left atrial size was severely dilated.  4. The mitral valve is degenerative. Mild mitral valve regurgitation.  Mild mitral stenosis. The mean mitral valve gradient is 3.6 mmHg with  average heart rate of 73 bpm. Moderate mitral annular calcification.  5. The aortic valve has been repaired/replaced. Aortic valve  regurgitation is not visualized. There is a 29 mm Edwards Sapien  prosthetic (TAVR) valve present in the aortic position. Procedure Date:  09/25/2020. Echo findings are consistent with normal  structure and function of the aortic valve prosthesis. Aortic valve mean  gradient measures 11.5 mmHg. Aortic valve Vmax measures 2.33 m/s.  Acceleration time 67 ms.   Patient Profile     Ebony Rickel a 80 y.o.malewith a hx of chronic diastolic CHF, severe AS (by echo in 06/2020), subdural hematoma in 06/2020 secondary to mechanical fall (s/p craniotomy hematoma evacuation), HTN and Type 2 DMwhowas admittedto [facility]22with acute CHF and found to have newly reduced EF 35-40%, RV dysfunction and critical AS. He was transferred to [facility] for further cardiac work up and evaluation by the structural heart team.  Assessment & Plan    Critical AS: s/p successful TAVR with a 29 mm Edwards Sapien 3 Ultra THV via the TF approach on 09/25/20. Post operative echo showed EF 40-45%, normally functioning TAVR with a mean gradient 11.93mm hg and no PVL. Groin sites are stable. 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 There is no evidence of HAVB. Started on DAPT with Asprin and Plavix.  Plan for ST SNF for PT given deconditioning. Case management working on placement.   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 He has had no progression since TAVR and been monitored on tele with no issues. No need to for Zio patch at this point.   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 Net negative ~28L and neg ~24 lbs. Dobutamine discontinued post operatively. EF improved to 40-45% post operatively. Currently on Spiro 12.5mg  daily. Plan for PRN torsemide. Has had continued brisk diuresis after stopping torsemide. Having some dizziness- consider stopping spiro? advanced CHF following along.   AKI superimposed on CKD stage IIIB: creat peaked at 2.36.  Creat stable ~ 1.44 today. Baseline likely 1.4-1.7.  Bilateral groin candidal infection: this improved with oral fluconazole and nystatin powder.   LE/metatarsal wounds with surrounding erythema: on antibiotics to cover possible cellulitis. Pt says these are chronic.   Renal cell carcinoma: this was picked up as an incidental finding on pre TAVR CT and confirmed on follow up MRI. Will arrange for outpatient urology follow up.   Pancreatic lesion: incidental finding on MRI. This could be a small side branch IPMN or postinflammatory cystic lesion. Pancreatic protocol MRI with and without contrast is recommended in 2 years time. If the patient has interval cross-sectional imaging related to follow up from the renal cell carcinoma, then that might be a reasonable substitute for MR imaging. This will be discussed as an outpatient.   Possible cirrhosis: pre TAVR Ct showed suggestion of a finely irregular liver surface, cannot exclude cirrhosis. Consider hepatic elastography for further liver fibrosis risk stratification, as clinically warranted. This will be discussed as an outpatient.   Traumatic subdural hematoma: 11/21 with craniotomy/evacuation.   Dysuria: pre op UA was abnormal. Also previously noted to have purulent discharge and ureteral meatus. Will check repeat  Ua with culture.   SignedAngelena Form, PA-C  09/28/2020, 8:39 AM  Pager [phone]  Patient seen, examined. Available data reviewed. Agree with findings, assessment, and plan as outlined by  Nell Range, PA-C. Patient interviewed and examined this morning. He is alert, oriented, in no distress. Lungs are clear, heart is regular rate and rhythm with a 2/6 ejection murmur at the right upper sternal border, abdomen is soft and nontender, extremity exam is unchanged with chronic stasis dermatitis and no significant edema. Appreciate care of the advanced heart failure team. They are giving some volume back today because the patient appears dry and orthostatic. We will check a urinalysis as outlined above. The patient has been covered with antibiotics. He is medically stable for discharge to short-term skilled nursing when a bed becomes available. FL-2 signed.  Sherren Mocha, M.D. 09/28/2020 10:42 AM

## 2020-09-28 NOTE — Progress Notes (Signed)
Mobility Specialist: Progress Note   09/28/20 1537  Mobility  Activity Refused mobility   Pt refused mobility c/o back pain and said he isn't doing any more therapy today. Explained to pt the benefits of exercise and moving, pt still refusing. Encouraged pt to work with staff.   [facility] Layten Aiken Mobility Specialist Mobility Specialist Phone: [phone]

## 2020-09-28 NOTE — Care Management Important Message (Signed)
Important Message  Patient Details  Name: Brett Williamson MRN: [id number] Date of Birth: Aug 13, 1940   Medicare Important Message Given:  Yes     Shelda Altes 09/28/2020, 8:58 AM

## 2020-09-28 NOTE — Progress Notes (Signed)
Patient ID: Brett Williamson, male   DOB: 10-19-1940, 80 y.o.   MRN: [id number]     Advanced Heart Failure Rounding Note  PCP-Cardiologist: No primary care provider on file.   Subjective:    2/15- S/P successful TAVR procedure on 2/15 with increase in EF from 30% to 45% post TAVR and with trivial perivalvular leak, Normal functioning aortic valve prosthesis    Off dobutamine. Co-ox 68% CVP remains low  Feels weak. Orthostatic. No CP or SOB/Weight down another 2 pounds Creatinine stable. Plan d/c to SNF     Objective:   Weight Range: 106.5 kg Body mass index is 32.75 kg/m.   Vital Signs:   Temp:  [97.5 F (36.4 C)-98.3 F (36.8 C)] 97.5 F (36.4 C) (02/18 0750) Pulse Rate:  [67-98] 86 (02/18 0750) Resp:  [12-20] 12 (02/18 0750) BP: (110-147)/(62-90) 147/90 (02/18 0750) SpO2:  [93 %-100 %] 93 % (02/18 0750) Weight:  [106.5 kg] 106.5 kg (02/18 0500) Last BM Date: 09/26/20  Weight change: Filed Weights   09/26/20 0645 09/27/20 0443 09/28/20 0500  Weight: 108.6 kg 107.5 kg 106.5 kg    Intake/Output:   Intake/Output Summary (Last 24 hours) at 09/28/2020 1013 Last data filed at 09/28/2020 0556 Gross per 24 hour  Intake 220 ml  Output 2400 ml  Net -2180 ml      Physical Exam    General:  Lying in bed  No resp difficulty General:  Well appearing. No resp difficulty HEENT: normal Neck: supple. no JVD. Carotids 2+ bilat; no bruits. No lymphadenopathy or thryomegaly appreciated. Cor: PMI nondisplaced. Regular rate & rhythm. Soft SEM RSB Lungs: clear Abdomen: soft, nontender, nondistended. No hepatosplenomegaly. No bruits or masses. Good bowel sounds. Extremities: no cyanosis, clubbing, rash, edema = eschars ot toes  Neuro: alert & orientedx3, cranial nerves grossly intact. moves all 4 extremities w/o difficulty. Affect pleasant   Telemetry   NSR 80s + PVCs Personally reviewed   Labs    CBC Recent Labs    09/27/20 0416 09/28/20 0500  WBC 8.5 8.4  HGB 14.3 13.7   HCT 46.2 42.6  MCV 78.8* 77.3*  PLT 184 502   Basic Metabolic Panel Recent Labs    09/26/20 0427 09/26/20 0958 09/27/20 0416 09/28/20 0500  NA  --    < > 131* 131*  K  --    < > 3.8 3.8  CL  --    < > 96* 96*  CO2  --    < > 24 25  GLUCOSE  --    < > 135* 119*  BUN  --    < > 43* 42*  CREATININE  --    < > 1.64* 1.44*  CALCIUM  --    < > 8.7* 8.4*  MG 1.9  --   --   --    < > = values in this interval not displayed.   Liver Function Tests No results for input(s): AST, ALT, ALKPHOS, BILITOT, PROT, ALBUMIN in the last 72 hours. No results for input(s): LIPASE, AMYLASE in the last 72 hours. Cardiac Enzymes No results for input(s): CKTOTAL, CKMB, CKMBINDEX, TROPONINI in the last 72 hours.  BNP: BNP (last 3 results) Recent Labs    09/11/20 1600 09/12/20 0708  BNP 1,413.0* 1,561.0*    ProBNP (last 3 results) No results for input(s): PROBNP in the last 8760 hours.   D-Dimer No results for input(s): DDIMER in the last 72 hours. Hemoglobin A1C No results for input(s): HGBA1C  in the last 72 hours. Fasting Lipid Panel No results for input(s): CHOL, HDL, LDLCALC, TRIG, CHOLHDL, LDLDIRECT in the last 72 hours. Thyroid Function Tests No results for input(s): TSH, T4TOTAL, T3FREE, THYROIDAB in the last 72 hours.  Invalid input(s): FREET3  Other results:  R/LHC results 09/18/20:   1. Diffuse mild non-obstructive CAD 2. Critical calcific aortic stenosis 3. Markedly elevated filling pressure with normal cardiac output on dobutamine 4. 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   Medications:     Scheduled Medications: . aspirin EC  81 mg Oral Daily  . Chlorhexidine Gluconate Cloth  6 each Topical Daily  . clopidogrel  75 mg Oral Q breakfast  . insulin aspart  0-15 Units Subcutaneous TID WC  . insulin aspart  0-5 Units Subcutaneous QHS  . mouth rinse  15 mL Mouth Rinse BID  . nystatin   Topical TID  . rosuvastatin  20 mg Oral Daily   . sodium chloride flush  10-40 mL Intracatheter Q12H  . sodium chloride flush  3 mL Intravenous Q12H    Infusions: . sodium chloride    . cefTRIAXone (ROCEPHIN)  IV 1 g (09/27/20 1317)  . sodium chloride      PRN Medications: sodium chloride, acetaminophen **OR** acetaminophen, Gerhardt's butt cream, guaiFENesin-dextromethorphan, morphine injection, ondansetron (ZOFRAN) IV, oxyCODONE, sodium chloride flush, sodium chloride flush, traMADol    Assessment/Plan   1. 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  ECHO 09/12/20 with EF down to 35-40%, worsening of AVA to 0.24 cm2 and mean gradient now 65.7 mmHg, new RWMA, new severely reduced RV function.  Likely due to worsening AS He has diffuse non obstructive CAD by LHC on 09/18/20.     - co-ox 70%  - Off dobutamine with good coox. He is dry and orthostatic - Give 1L NS today. Stop spiro  - EF 45-50% - Can retry spiro or add ARB in near future as outpatient. No SGLT2i with severe groin yeast on admit  2. Severe Aortic Stenosis:  Critical stenosis, significantly progressed since 06/2020. By echo, worsening of AVA to 0.24 cm2 and mean gradient now 65.7 mmHg - R/LHC done on 2/8,  CT scans complete - s/p Successful TAVR 2/15,  - Echo 2/16 EF 45-50% with AVGm 61mmHG  3. AKI on CKD 3b: Baseline cr around 1.6. peaked at 2.36   - Creatinine stable 1.4 - Off dobutamine post TAVR procedure - Hold diuretics  4. 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 Hx of Traumatic subdural hematoma: 11/21 with craniotomy/evacuation.  He is on SCDs for now for DVT prophylaxis.   - continue low dose lovenox  6. Renal mass on CT/Irregular liver surface - MRI complete. Shows RCC in lower pole of R kidney - will need outpatient Urology f/u - will need repeat liver imaging outpatient hepatic  elastography  7. Iron deficiency - Received Feraheme 2/12  8. Deconditioning - this is major issue. Patient and wife do not think they can go home safely - PT/OT recommending HHPT initially - Pending SNF placement   9. LE wounds with surrounding erythema -covering for cellulitis with IV ceftriaxone also rec'd vanc and cefuroxime yesterday for procedure - can stop after today   AHF team will sign off. Will arrange outpatient f/u.  Length of Stay: Leonard, MD  09/28/2020, 10:13 AM  Advanced Heart Failure Team Pager [phone] (M-F; 7a - 4p)  Please contact [facility] for night-coverage after hours (4p -7a ) and weekends on [email or web address]

## 2020-09-28 NOTE — TOC Progression Note (Signed)
Transition of Care [facility] - Progression Note    Patient Details  Name: Brett Williamson MRN: [id number] Date of Birth: [birth year]  Transition of Care [facility]) CM/SW [place], [place] Phone Number: 09/28/2020, 11:44 AM  Clinical Narrative:     CSW soke with patient's daughter-informed of bed offers. Advised [facility] had declined and [facility] remains pending. CSW contacted Admission/Melanie-left voice message to return call.  Thurmond Butts, MSW, LCSW Clinical Social Worker   Expected Discharge Plan: [place] Barriers to Discharge: SNF Pending bed offer  Expected Discharge Plan and Services Expected Discharge Plan: [place] In-house Referral: Clinical Social Work Discharge Planning Services: CM Consult Post Acute Care Choice: [place] arrangements for the past 2 months: [place]: [facility] [place] Determinants of Health (SDOH) Interventions    Readmission Risk Interventions No flowsheet data found.

## 2020-09-28 NOTE — Progress Notes (Signed)
Physical Therapy Treatment Patient Details Name: Brett Williamson MRN: [id number] DOB: [birth year] Today's Date: 09/28/2020    History of Present Illness 80 yo male with who presented to the ER with worsening shortness of breath especially with short distances of ambulating. 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 PMH diastolic CHF, TGG2I, DMII, recent SDH (s/p right craniotomy 06/19/20), deconditioning (d/c from rehab on 07/05/20), HTN    PT Comments    Pt admitted with above diagnosis. 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 Daughter and pt educated regarding performance of exercises. Updated SW to notify SNF of need for vestibular rehab. Pt was soaked with urine on arrival therefore got pt to EOB and stood to clean him and change his linens. Daughter was asking if pt was getting his restless leg syndrome medication and vertigo medicine therefore messaged MD about these items.  PT called nurse to place Mepiplex dressing on pts back as well as he had small abrasion.  Pt was limited due to c/o significant back pain today with all movements.  Goals revised as pt has not progressed as anticipated therefore downgraded goals. Will continue to follow acutely.  Pt currently with functional limitations due to balance and endurance deficits. Pt will benefit from skilled PT to increase their independence and safety with mobility to allow discharge to the venue listed below.     Follow Up Recommendations  SNF;Supervision/Assistance - 24 hour (if family instead chooses home, HHPT and likely WC level)     Equipment Recommendations  Wheelchair (measurements PT);Wheelchair cushion (measurements PT) (ramp for home entry if home)    Recommendations for Other Services       Precautions / Restrictions Precautions Precautions: Fall Restrictions Weight Bearing Restrictions: No    Mobility  Bed Mobility Overal bed mobility: Needs Assistance Bed Mobility: Supine to Sit     Supine to sit: Mod assist Sit to  supine: Mod assist   General bed mobility comments: Pt needed mod assist to come EOB due to c/o back pain.    Transfers Overall transfer level: Needs assistance Equipment used: Rolling walker (2 wheeled) Transfers: Sit to/from Stand Sit to Stand: Mod assist         General transfer comment: modA for steadying/trunk rise; painful with standing. Had to clean pt of BM and pt was soaked with urine therefore changed pads.  Pt refused to get to chair.  No dizziness per pt report today however see vestibular assessment below.  Ambulation/Gait                 Stairs             Wheelchair Mobility    Modified Rankin (Stroke Patients Only)       Balance Overall balance assessment: Needs assistance Sitting-balance support: No upper extremity supported;Feet supported Sitting balance-Leahy Scale: Good     Standing balance support: Bilateral upper extremity supported;During functional activity Standing balance-Leahy Scale: Poor Standing balance comment: Pt must have walker and external support to remain standing.                            Cognition Arousal/Alertness: Awake/alert Behavior During Therapy: WFL for tasks assessed/performed Overall Cognitive Status: History of cognitive impairments - at baseline                                 General Comments: pt has daughter than can help  when he transitions back home but cannot live with him long term.      Exercises Other Exercises Other Exercises: x1 exercises    General Comments General comments (skin integrity, edema, etc.): Pt with negative BPPV testing all canals.  Pt with right beating nystagmus suspect left hypofunction therefore initiated x 1 exercises and pt was able to complete 4 - 5 reps.  Daughter present and appreciative of PT assessment and treatment.      Pertinent Vitals/Pain Pain Assessment: Faces Faces Pain Scale: Hurts whole lot Pain Location: back Pain  Descriptors / Indicators: Discomfort;Grimacing;Guarding Pain Intervention(s): Limited activity within patient's tolerance;Monitored during session;Repositioned    Home Living                      Prior Function            PT Goals (current goals can now be found in the care plan section) Acute Rehab PT Goals Patient Stated Goal: to get stronger and go home PT Goal Formulation: With patient/family Time For Goal Achievement: 10/12/20 Potential to Achieve Goals: Fair Additional Goals Additional Goal #1: Pt to participate in x 1 exercises side to side with min assist and cues. Progress towards PT goals: Not progressing toward goals - comment (back pain)    Frequency    Min 3X/week      PT Plan Current plan remains appropriate    Co-evaluation              AM-PAC PT "6 Clicks" Mobility   Outcome Measure  Help needed turning from your back to your side while in a flat bed without using bedrails?: None Help needed moving from lying on your back to sitting on the side of a flat bed without using bedrails?: A Little Help needed moving to and from a bed to a chair (including a wheelchair)?: A Little Help needed standing up from a chair using your arms (e.g., wheelchair or bedside chair)?: A Lot Help needed to walk in hospital room?: A Lot Help needed climbing 3-5 steps with a railing? : Total 6 Click Score: 15    End of Session Equipment Utilized During Treatment: Gait belt Activity Tolerance: Patient limited by fatigue;Patient limited by pain Patient left: with call bell/phone within reach;in bed;with family/visitor present;with bed alarm set (pt does not want legs reclined) Nurse Communication: Mobility status (MD made aware that pt requesting restless leg syndrome med and vertigo med) PT Visit Diagnosis: Muscle weakness (generalized) (M62.81)     Time: 1017-5102 PT Time Calculation (min) (ACUTE ONLY): 34 min  Charges:  $Therapeutic Exercise: 8-22  mins $Therapeutic Activity: 8-22 mins                     Idalia Allbritton W,PT Acute Rehabilitation Services Pager:  [phone]  Office:  [place] 09/28/2020, 4:11 PM

## 2020-09-29 DIAGNOSIS — N179 Acute kidney failure, unspecified: Secondary | ICD-10-CM

## 2020-09-29 LAB — GLUCOSE, CAPILLARY
Glucose-Capillary: 126 mg/dL — ABNORMAL HIGH (ref 70–99)
Glucose-Capillary: 167 mg/dL — ABNORMAL HIGH (ref 70–99)
Glucose-Capillary: 96 mg/dL (ref 70–99)

## 2020-09-29 LAB — CBC
HCT: 44.4 % (ref 39.0–52.0)
Hemoglobin: 13.7 g/dL (ref 13.0–17.0)
MCH: 24.6 pg — ABNORMAL LOW (ref 26.0–34.0)
MCHC: 30.9 g/dL (ref 30.0–36.0)
MCV: 79.9 fL — ABNORMAL LOW (ref 80.0–100.0)
Platelets: 195 10*3/uL (ref 150–400)
RBC: 5.56 MIL/uL (ref 4.22–5.81)
RDW: 16.4 % — ABNORMAL HIGH (ref 11.5–15.5)
WBC: 8.8 10*3/uL (ref 4.0–10.5)
nRBC: 0 % (ref 0.0–0.2)

## 2020-09-29 LAB — BASIC METABOLIC PANEL
Anion gap: 9 (ref 5–15)
BUN: 37 mg/dL — ABNORMAL HIGH (ref 8–23)
CO2: 24 mmol/L (ref 22–32)
Calcium: 8.6 mg/dL — ABNORMAL LOW (ref 8.9–10.3)
Chloride: 102 mmol/L (ref 98–111)
Creatinine, Ser: 1.41 mg/dL — ABNORMAL HIGH (ref 0.61–1.24)
GFR, Estimated: 50 mL/min — ABNORMAL LOW (ref >60)
Glucose, Bld: 115 mg/dL — ABNORMAL HIGH (ref 70–99)
Potassium: 4.2 mmol/L (ref 3.5–5.1)
Sodium: 135 mmol/L (ref 135–145)

## 2020-09-29 LAB — COOXEMETRY PANEL
Carboxyhemoglobin: 1.3 % (ref 0.5–1.5)
Methemoglobin: 0.6 % (ref 0.0–1.5)
O2 Saturation: 70.7 %
Total hemoglobin: 13.8 g/dL (ref 12.0–16.0)

## 2020-09-29 LAB — URINE CULTURE: Culture: NO GROWTH

## 2020-09-29 MED ORDER — TRIAMCINOLONE ACETONIDE 0.1 % EX CREA: 1.0000 "application " | TOPICAL_CREAM | CUTANEOUS | 0 refills | Status: DC

## 2020-09-29 MED ORDER — ROSUVASTATIN CALCIUM 20 MG PO TABS: 20.0000 mg | ORAL_TABLET | Freq: Every day | ORAL | 6 refills | Status: DC

## 2020-09-29 MED ORDER — ASPIRIN 81 MG PO TBEC: 81.0000 mg | DELAYED_RELEASE_TABLET | Freq: Every day | ORAL | 11 refills | Status: DC

## 2020-09-29 MED ORDER — NYSTATIN 100000 UNIT/GM EX POWD: Freq: Three times a day (TID) | CUTANEOUS | 0 refills | Status: DC

## 2020-09-29 MED ORDER — ACETAMINOPHEN 325 MG PO TABS: 650.0000 mg | ORAL_TABLET | Freq: Four times a day (QID) | ORAL | 0 refills | Status: AC | PRN

## 2020-09-29 MED ORDER — CLOPIDOGREL BISULFATE 75 MG PO TABS: 75.0000 mg | ORAL_TABLET | Freq: Every day | ORAL | 6 refills | Status: DC

## 2020-09-29 MED ORDER — GERHARDT'S BUTT CREAM: 1.0000 "application " | TOPICAL_CREAM | Freq: Two times a day (BID) | CUTANEOUS | 0 refills | Status: DC | PRN

## 2020-09-29 MED ORDER — TRAMADOL HCL 50 MG PO TABS: 50.0000 mg | ORAL_TABLET | Freq: Two times a day (BID) | ORAL | 0 refills | Status: DC | PRN

## 2020-09-29 NOTE — Progress Notes (Signed)
Mobility Specialist - Progress Note   09/29/20 1447  Mobility  Activity Refused mobility   Pt states he needs to get prepared for a transfer and does not wish to attempt to stand or sit on edge of bed.   Pricilla Handler Mobility Specialist Mobility Specialist Phone: [phone]

## 2020-09-29 NOTE — Progress Notes (Signed)
Report given to Van Clines at [employer].  Daymon Larsen, RN

## 2020-09-29 NOTE — TOC Transition Note (Signed)
Transition of Care [facility]) - CM/SW Discharge Note   Patient Details  Name: Brett Williamson MRN: [id number] Date of Birth: 09-10-1940  Transition of Care [facility]) CM/SW Contact:  Gabrielle Dare Phone Number: 09/29/2020, 4:34 PM   Clinical Narrative:    Patient will Discharge To: [place] Anticipated DC Date:09/29/20 Family Notified:yes, daughter Brett Williamson, [phone] Transport By: Corey Harold   Per MD patient ready for DC to [facility] . RN, patient, patient's family, and facility notified of DC. Assessment, Fl2/Pasrr, and Discharge Summary sent to facility. RN given number for report [phone], Room 117A). DC packet on chart. Ambulance transport requested for patient.   CSW signing off.  Reed B[facility] [phone]     Final next level of care: Skilled Nursing Facility Barriers to Discharge: No Barriers Identified   Patient Goals and CMS Choice Patient states their goals for this hospitalization and ongoing recovery are:: return home after rehab stay CMS Medicare.gov Compare Post Acute Care list provided to:: Patient Choice offered to / list presented to : [place]  Discharge Placement              Patient chooses bed at: [facility] Patient to be transferred to facility by: [place] Name of family member notified: Brett Williamson, daughter Patient and family notified of of transfer: 09/29/20  Discharge Plan and Services In-house Referral: Clinical Social Work Discharge Planning Services: CM Consult Post Acute Care Choice: [place]: Well [place]        Social Determinants of Health (SDOH) Interventions     Readmission Risk Interventions No flowsheet data found.

## 2020-09-29 NOTE — Discharge Summary (Signed)
Discharge Summary    Patient ID: Brett Williamson MRN: [id number]; DOB: [birth year]  Admit date: 09/11/2020 Discharge date: 09/29/2020  Primary Care Provider: Neale Burly, MD  Primary Cardiologist: Rozann Lesches, MD  Primary Electrophysiologist:  None  Advanced Heart Failure: Glori Bickers, MD Structural Heart: Sherren Mocha, MD   Discharge Diagnoses    Principal Problem:   S/P TAVR (transcatheter aortic valve replacement) Active Problems:   History of subdural hematoma   Essential hypertension   Controlled type 2 diabetes mellitus with hyperglycemia, without long-term current use of insulin (HCC)   Acute on chronic diastolic CHF (congestive heart failure) (HCC)   Elevated troponin   Physical deconditioning   Candidiasis of skin   Acute cystitis   Chronic stasis dermatitis   Chronic renal failure, stage 3b ([place])   Allergies No Known Allergies  Diagnostic Studies/Procedures    ECHO: 09/12/2000 1. Left ventricular ejection fraction, by estimation, is 35 to 40%. The  left ventricle has moderately decreased function. The left ventricle  demonstrates regional wall motion abnormalities (see scoring  diagram/findings for description). There is mild  left ventricular hypertrophy. Left ventricular diastolic parameters are  consistent with Grade II diastolic dysfunction (pseudonormalization).  2. Right ventricular systolic function is severely reduced. The right  ventricular size is normal. Tricuspid regurgitation signal is inadequate  for assessing PA pressure.  3. The mitral valve is abnormal. Mild mitral valve regurgitation.  Moderate mitral annular calcification.  4. The aortic valve has an indeterminant number of cusps. There is severe  calcifcation of the aortic valve. Aortic valve regurgitation is not  visualized. Severe-critical aortic valve stenosis. Aortic valve mean  gradient measures 65.7 mmHg. Aortic valve  Vmax measures 4.89 m/s.  5. The inferior vena  cava is normal in size with <50% respiratory  variability, suggesting right atrial pressure of 8 mmHg.   ECHO: Post TAVR 09/26/2020 1. Left ventricular ejection fraction, by estimation, is 40 to 45%. The  left ventricle has mildly decreased function. The left ventricle  demonstrates regional wall motion abnormalities (see scoring  diagram/findings for description). There is mild  concentric left ventricular hypertrophy. Left ventricular diastolic  parameters are consistent with Grade II diastolic dysfunction  (pseudonormalization). Elevated left atrial pressure. There is moderate  hypokinesis of the left ventricular, entire lateral  wall. There is moderate hypokinesis of the left ventricular, mid-apical  anterior wall.  2. Right ventricular systolic function is normal. The right ventricular  size is normal.  3. Left atrial size was severely dilated.  4. The mitral valve is degenerative. Mild mitral valve regurgitation.  Mild mitral stenosis. The mean mitral valve gradient is 3.6 mmHg with  average heart rate of 73 bpm. Moderate mitral annular calcification.  5. The aortic valve has been repaired/replaced. Aortic valve  regurgitation is not visualized. There is a 29 mm Edwards Sapien  prosthetic (TAVR) valve present in the aortic position. Procedure Date:  09/25/2020. Echo findings are consistent with normal  structure and function of the aortic valve prosthesis. Aortic valve mean  gradient measures 11.5 mmHg. Aortic valve Vmax measures 2.33 m/s.  Acceleration time 67 ms.   TAVR OP NOTE: 09/25/2020  DETAILS OF THE OPERATIVE PROCEDURE  PREPARATION:    The patient is brought to the operating room on the above mentioned date and appropriate monitoring was established by the anesthesia team. The patient is placed in the supine position on the operating table.  Intravenous antibiotics are administered. The patient is monitored closely throughout the procedure under conscious  sedation.  Baseline transthoracic echocardiogram was performed. The patient's chest, abdomen, both groins, and both lower extremities are prepared and draped in a sterile manner. A time out procedure is performed.   PERIPHERAL ACCESS:    Using the modified Seldinger technique, femoral arterial and venous access was obtained with placement of 6 Fr sheaths on the left side.  A pigtail diagnostic catheter was passed through the leftg arterial sheath under fluoroscopic guidance into the aortic root.  A temporary transvenous pacemaker catheter was passed through the left femoral venous sheath under fluoroscopic guidance into the right ventricle.  The pacemaker was tested to ensure stable lead placement and pacemaker capture. Aortic root angiography was performed in order to determine the optimal angiographic angle for valve deployment.   TRANSFEMORAL ACCESS:   Percutaneous transfemoral access and sheath placement was performed using ultrasound guidance.  The right common femoral artery was cannulated using a micropuncture needle and appropriate location was verified using hand injection angiogram.  A pair of Abbott Perclose percutaneous closure devices were placed and a 6 French sheath replaced into the femoral artery.  The patient was heparinized systemically and ACT verified > 250 seconds.    A 16 Fr transfemoral E-sheath was introduced into the right common femoral artery after progressively dilating over an Amplatz superstiff wire. An AL-2 catheter was used to direct a straight-tip exchange length wire across the native aortic valve into the left ventricle. This was exchanged out for a pigtail catheter and position was confirmed in the LV apex. Simultaneous LV and Ao pressures were recorded.  The pigtail catheter was exchanged for an Amplatz Extra-stiff wire in the LV apex.  Echocardiography was utilized to confirm appropriate wire position and no sign of entanglement in the mitral subvalvular  apparatus.   BALLOON AORTIC VALVULOPLASTY:   Balloon aortic valvuloplasty was performed using a 20 mm valvuloplasty balloon.  Once optimal position was achieved, BAV was done under rapid ventricular pacing. The patient recovered well hemodynamically.    TRANSCATHETER HEART VALVE DEPLOYMENT:   An Edwards Sapien 3 transcatheter heart valve (size 29 mm, model #[id number], serial #[id number]) was prepared and crimped per manufacturer's guidelines, and the proper orientation of the valve is confirmed on the [employer] delivery system. The valve was advanced through the introducer sheath using normal technique until in an appropriate position in the abdominal aorta beyond the sheath tip. The balloon was then retracted and using the fine-tuning wheel was centered on the valve. The valve was then advanced across the aortic arch using appropriate flexion of the catheter. The valve was carefully positioned across the aortic valve annulus. The Commander catheter was retracted using normal technique. Once final position of the valve has been confirmed by angiographic assessment, the valve is deployed while temporarily holding ventilation and during rapid ventricular pacing to maintain systolic blood pressure < 50 mmHg and pulse pressure < 10 mmHg. The balloon inflation is held for >3 seconds after reaching full deployment volume. Once the balloon has fully deflated the balloon is retracted into the ascending aorta and valve function is assessed using echocardiography. There is felt to be trivial paravalvular leak and no central aortic insufficiency.  The patient's hemodynamic recovery following valve deployment is good.  The deployment balloon and guidewire are both removed.    PROCEDURE COMPLETION:   The sheath was removed and femoral artery closure performed.  Protamine was administered once femoral arterial repair was complete. The temporary pacemaker, pigtail catheters and femoral sheaths were  removed with manual  pressure used for hemostasis.  A Mynx femoral closure device was utilized following removal of the diagnostic sheath in the left femoral artery.  The patient tolerated the procedure well and is transported to the surgical intensive care in stable condition. There were no immediate intraoperative complications. All sponge instrument and needle counts are verified correct at completion of the operation.   No blood products were administered during the operation.   CARDIAC TAVR CT: 09/25/2020 TECHNIQUE:  The patient was scanned on a [employer]. A 140 kV  retrospective scan was triggered in the descending thoracic aorta at  111 HU's. Gantry rotation speed was 250 msecs and collimation was .6  mm. No beta blockade or nitro were given. The 3D data set was  reconstructed in 5% intervals of the R-R cycle. Systolic and  diastolic phases were analyzed on a dedicated work station using  MPR, MIP and VRT modes. The patient received 80 cc of contrast.   FINDINGS:  Image quality: Average.   Noise artifact is: Mild.   Valve Morphology: Tricuspid aortic valve with severely calcified  leaflets with severely restricted movement in systole. The leaflets  are all diffusely calcified.   Aortic Valve Calcium score: 6553   Aortic annular dimension:   Phase assessed: 30%   Annular area: 610 mm2   Annular perimeter: 91.7 mm   Max diameter: 34.2 mm   Min diameter: 23.8 mm   Annular and subannular calcification: None.   Optimal coplanar projection: LAO 4 CRA 2   Coronary Artery Height above Annulus:   Left Main: 10.3 mm   Right Coronary: 14.8 mm   Sinus of Valsalva Measurements:   Non-coronary: 21.3 mm   Right-coronary: 22.6 mm   Left-coronary: 19.3 mm   Sinus of Valsalva Height:   Non-coronary: 38 mm   Right-coronary: 35 mm   Left-coronary: 38 mm   Sinotubular Junction: 37 mm   Ascending Thoracic Aorta: 38 mm (measured using double oblique   technique).   Coronary Arteries: Normal coronary origin. Right dominance. The  study was performed without use of NTG and is insufficient for  plaque evaluation. Please refer to recent cardiac catheterization  for coronary assessment. 3-vessel coronary calcifications noted.   Cardiac Morphology:   Right Atrium: Right atrial size is within normal limits.   Right Ventricle: The right ventricular cavity is dilated.   Left Atrium: Left atrial size is normal in size with no left atrial  appendage filling defect.   Left Ventricle: The ventricular cavity size is within normal limits.  There are no stigmata of prior infarction. There is no abnormal  filling defect. Left ventricular function is moderately reduced,  36%. There is global hypokinesis.   Pulmonary arteries: Dilated suggestive of pulmonary hypertension.   Pulmonary veins: Normal pulmonary venous drainage.   Pericardium: Normal thickness with no significant effusion or  calcium present.   Mitral Valve: The mitral valve is normal structure with moderate  mitral annular calcification.   Extra-cardiac findings: See attached radiology report for  non-cardiac structures.   IMPRESSION:  1. Tricuspid aortic valve with severe aortic stenosis (calcium score  6553).   2. Annular measurements appropriate for 29 mm Sapien 3 TAVR (610  mm2).   3. No significant annular or subannular calcifications.   4. Borderline LCA height, 10.3 mm. Sufficient RCA height, 14.8 mm.   5. Optimal Fluoroscopic Angle for Delivery: LAO 4 CRA 2   6. Dilated pulmonary artery suggestive of pulmonary hypertension.   7. Moderately reduced  LV function, EF 36%. Global hypokinesis.    RIGHT/LEFT HEART CATH: 09/18/2020   Prox RCA lesion is 40% stenosed.  Ost Cx to Prox Cx lesion is 40% stenosed.  Mid LAD lesion is 30% stenosed.   Findings:  On dobutamine 2.5   Ao = 103/66 (82)  LV = 214/35 RA = 13 RV = 63/15 PA = 79/31 (51) PCW = 32  (v = 43) Fick cardiac output/index = 6.3/2.7 PVR = 3.0 WU Ao sat = 96% PA sat = 66%, 67% PAPI = 3.7  AoV: Peak-to-peak gradient = 89 mm HG         Mean gradient = 60 mmHG         AVA = 0.71 cm2 Assessment:  1. Diffuse mild non-obstructive CAD 2. Critical calcific aortic stenosis 3. Markedly elevated filling pressure with normal cardiac output on dobutamine 4. 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 Continue diuresis. Continue TAVR w/u.   _____________   History of Present Illness     Brett Williamson is a 80 y.o. male with chronic diastolic CHF, severe AS (by echo in 06/2020), subdural hematoma in 06/2020 secondary to mechanical fall (s/p craniotomy hematoma evacuation), HTN and Type 2 DM.  He was admitted but I am to [facility] on 09/11/2020 with CHF exacerbation.  He was seen by cardiology and care was transferred to our service.  Hospital Course     Consultants: Dr. Roxy Manns, Dr. Haroldine Laws, Dr. Burt Knack  1.  Acute on chronic diastolic CHF: His EF was now reduced at 35-40% with grade 2 diastolic dysfunction, reduced RV function and aortic stenosis mean gradient 65.7 mmHg, peak gradient 95.8 mmHg.  Diuresis was limited by worsening renal function, creatinine went up to 2.18.  He was transferred to [facility]. After arrival at [facility], he was started on dobutamine and Dr. Haroldine Laws and Dr. Burt Knack consulted on the patient.  Creatinine continued to go up to 2.36, Lasix was held and his creatinine improved.  He had a left/right heart cath with diffuse nonobstructive CAD, markedly elevated filling pressures with normal cardiac output on dobutamine.  He also had moderate to severe pulmonary hypertension.  He had a TAVR CT with low contrast protocol and his aortic stenosis was felt suitable for treatment but TAVR.  After he lost 16 pounds, he was felt to be euvolemic and his creatinine was back down to 1.38, considered his baseline.  His volume status continue to be  monitored closely.  I/O was net -27 L and his weight decreased by 19 pounds.  He is to continue torsemide 40 mg and spironolactone 12.5 mg daily  2.  Severe aortic stenosis: TAVR CT and echo results were reviewed by Dr. Burt Knack Dr. Roxy Manns also consulted for the aortic stenosis on February 11, after his general medical condition had improved.  He was approved for TAVR and this was scheduled for 09/25/2020. Post TAVR, he was able to come off the dobutamine and his groin sites were stable. Coox was 67%, and although his weight was up a little, CVP was 8-9.  He was started on DAPT with aspirin and Plavix.  He has a history of RBBB and first-degree AV block, he is to be discharged with a ZIO AT to monitor for high-grade heart block.  3.  Bilateral groin candidal infection: This was present on admission, treated with oral fluconazole and nystatin powder.  4.  Lower extremity metatarsal wounds: The patient says those were chronic, but he was on antibiotics  for possible cellulitis  5.  Renal cell carcinoma: This was an incidental finding on pre-TAVR CT, confirmed by MRI.  He needs early follow-up with urology.  6.  Pancreatic lesion: This is an incidental finding on MRI.  Pancreatic protocol MRI with and without contrast is recommended in 2 years.  If he has interval cross-sectional imaging related to follow-up for the renal cell carcinoma, this could be a reasonable substitute for MRI imaging.  This will be discussed as an outpatient.  7.  Possible cirrhosis: Pre-TAVR CT showed irregular liver surface, possible cirrhosis.  Hepatic elastography should be considered in the future for risk stratification.  Follow-up as an outpatient.  8.  Deconditioning: He was seen by PT and OT.  CR with 24-hour supervision/assistance was recommended.  SNF was felt to be the best option.  Case management worked with the family to obtain a place.  Patient for this, he had a Covid test on 2/18 that was negative.  On 2/19, he was  seen by Dr. Gardiner Rhyme and all data were reviewed.  His volume status was good, discharge weight 240 pounds.  He was tolerating his medications.  Heart rhythm was stable.  Creatinine stable at 1.4, coox 71%.  No further inpatient work-up is indicated and he is considered stable for discharge, to follow-up as an outpatient.  _____________  Discharge Vitals Blood pressure 119/63, pulse 60, temperature 97.9 F (36.6 C), temperature source Oral, resp. rate 20, height 5\' 11"  (1.803 m), weight 109.1 kg, SpO2 98 %.  Filed Weights   09/27/20 0443 09/28/20 0500 09/29/20 0358  Weight: 107.5 kg 106.5 kg 109.1 kg    Labs & Radiologic Studies    CBC Recent Labs    09/28/20 0500 09/29/20 0602  WBC 8.4 8.8  HGB 13.7 13.7  HCT 42.6 44.4  MCV 77.3* 79.9*  PLT 178 536   Basic Metabolic Panel Recent Labs    09/28/20 0500 09/29/20 0602  NA 131* 135  K 3.8 4.2  CL 96* 102  CO2 25 24  GLUCOSE 119* 115*  BUN 42* 37*  CREATININE 1.44* 1.41*  CALCIUM 8.4* 8.6*   Liver Function Tests Lab Results  Component Value Date   ALT 33 09/22/2020   AST 31 09/22/2020   ALKPHOS 69 09/22/2020   BILITOT 1.3 (H) 09/22/2020     High Sensitivity Troponin:   Recent Labs  Lab 09/11/20 1750 09/11/20 1759 09/12/20 0027 09/12/20 0708 09/12/20 1214  TROPONINIHS 211* 207* 231* 248* 252*    BNP BNP    Component Value Date/Time   BNP 1,561.0 (H) 09/12/2020 0708   Hemoglobin A1C Lab Results  Component Value Date   HGBA1C 7.1 (H) 09/11/2020   Fasting Lipid Panel Lab Results  Component Value Date   CHOL 116 09/14/2020   HDL 26 (L) 09/14/2020   LDLCALC 74 09/14/2020   TRIG 80 09/14/2020   CHOLHDL 4.5 09/14/2020    Thyroid Function Tests Lab Results  Component Value Date   TSH 1.740 09/11/2020    _____________  DG Chest 2 View  Result Date: 09/24/2020 CLINICAL DATA:  Preop for aortic valve repair. EXAM: CHEST - 2 VIEW COMPARISON:  September 15, 2020. FINDINGS: Stable cardiomediastinal  silhouette. No pneumothorax is noted. Small bilateral pleural effusions are noted. Minimal bibasilar subsegmental atelectasis is noted. Bony thorax is unremarkable. IMPRESSION: Small bilateral pleural effusions. Minimal bibasilar subsegmental atelectasis. Electronically Signed   By: Marijo Conception M.D.   On: 09/24/2020 14:21   DG Chest 2  View  Result Date: 09/15/2020 CLINICAL DATA:  Shortness of breath. EXAM: CHEST - 2 VIEW COMPARISON:  September 14, 2020. FINDINGS: Stable cardiomediastinal silhouette. No pneumothorax is noted. Bibasilar opacities are noted concerning for atelectasis or edema with probable mild associated pleural effusions. Bony thorax is unremarkable. IMPRESSION: Bibasilar atelectasis or edema with probable mild associated pleural effusions. Electronically Signed   By: Marijo Conception M.D.   On: 09/15/2020 12:46   DG Chest 2 View  Result Date: 09/11/2020 CLINICAL DATA:  Sepsis EXAM: CHEST - 2 VIEW COMPARISON:  June 19, 2020 FINDINGS: There are diffuse bilateral airspace opacities. The lung volumes are low. The heart size is enlarged. There are bilateral pleural effusions, right greater than left. No pneumothorax or acute osseous abnormality. There is vascular congestion with interstitial edema. IMPRESSION: 1. Cardiomegaly with findings concerning for congestive heart failure. 2. Low lung volumes with small bilateral pleural effusions. Electronically Signed   By: Constance Holster M.D.   On: 09/11/2020 16:38   MR ABDOMEN W WO CONTRAST  Result Date: 09/22/2020 CLINICAL DATA:  Renal mass and renal insufficiency EXAM: MRI ABDOMEN WITHOUT AND WITH CONTRAST TECHNIQUE: Multiplanar multisequence MR imaging of the abdomen was performed both before and after the administration of intravenous contrast. CONTRAST:  2mL GADAVIST GADOBUTROL 1 MMOL/ML IV SOLN COMPARISON:  09/20/2020 CT scan FINDINGS: Despite efforts by the technologist and patient, motion artifact is present on today's exam and could  not be eliminated. This reduces exam sensitivity and specificity. Lower chest: Bilateral pleural effusions with associated passive atelectasis. Hepatobiliary: Layering gallstones in the gallbladder. One filling defect centrally in the gallbladder lumen may have near neutral buoyancy due to internal nitrogen gas. No significant focal parenchymal lesion is identified. Reduction in signal intensity in the liver (and the spleen and adrenal glands) on inphase images compared to out of phase images, resembling iron deposition, although the patient is also actively receiving Feraheme which can cause a similar appearance even in the setting of normal ferritin levels, and this imaging affect can persist for 3 months after the medication administration. Pancreas: 0.7 cm cystic lesion along the margin of the pancreatic tail on image 13 of series 10 and image 19 of series 5, no appreciable associated enhancement. Spleen:  Unremarkable Adrenals/Urinary Tract: 3.4 by 3.2 cm left adrenal mass with fatty elements, myelolipoma versus adenoma. Low signal intensity in the adrenal glands with chemical shift in some blooming on inphase images, probably related to medication effect from Feraheme. An exophytic diffusely enhancing mass of the left kidney lower pole anteriorly measures 1.9 by 1.6 by 2.0 cm as shown on image 55 of series 16, and is compatible with a renal cell carcinoma. Scattered left renal scarring. Benign-appearing bilateral renal cysts. Stomach/Bowel: Unremarkable where included. Vascular/Lymphatic: Aortoiliac atherosclerotic vascular disease. Small retroperitoneal lymph nodes are not pathologically enlarged by size criteria. No tumor thrombus in the left renal vein. Other:  No supplemental non-categorized findings. Musculoskeletal: Lumbar spondylosis and degenerative disc disease. Low signal in the marrow likely attributable to [facility]. IMPRESSION: 1. A 1.9 by 1.6 by 2.0 cm diffusely enhancing mass of the left kidney  lower pole is compatible with a renal cell carcinoma. No adenopathy or tumor thrombus in the left renal vein. 2. Reduction in signal intensity in the liver (and the spleen, bone marrow, and adrenal glands) on inphase images compared to out of phase images, resembling iron deposition, likely due to recent administrations of Feraheme. Appearance can occur even in the setting of normal ferritin levels,  and can persist for 3 months after Feraheme administration. 3. 0.7 cm cystic lesion along the margin of the pancreatic tail, no appreciable associated enhancement. This could be a small side branch IPMN or postinflammatory cystic lesion. Pancreatic protocol MRI with and without contrast is recommended in 2 years time. If the patient has interval cross-sectional imaging related to follow up from the renal cell carcinoma, then that might be a reasonable substitute for MR imaging. This recommendation follows ACR consensus guidelines: Management of Incidental Pancreatic Cysts: A White Paper of the ACR Incidental Findings Committee. J Am Coll Radiol 6606;30:160-109. 4. Bilateral pleural effusions with associated passive atelectasis. 5. Cholelithiasis. 6. Lumbar spondylosis and degenerative disc disease. Electronically Signed   By: Van Clines M.D.   On: 09/22/2020 10:09   CARDIAC CATHETERIZATION  Result Date: 09/18/2020  Prox RCA lesion is 40% stenosed.  Ost Cx to Prox Cx lesion is 40% stenosed.  Mid LAD lesion is 30% stenosed.  Findings: On dobutamine 2.5 Ao = 103/66 (82) LV = 214/35 RA = 13 RV = 63/15 PA = 79/31 (51) PCW = 32 (v = 43) Fick cardiac output/index = 6.3/2.7 PVR = 3.0 WU Ao sat = 96% PA sat = 66%, 67% PAPI = 3.7 AoV: Peak-to-peak gradient = 89 mm HG         Mean gradient = 60 mmHG         AVA = 0.71 cm2 Assessment: 1. Diffuse mild non-obstructive CAD 2. Critical calcific aortic stenosis 3. Markedly elevated filling pressure with normal cardiac output on dobutamine 4. 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 Continue diuresis. Continue TAVR w/u. Glori Bickers, MD 10:00 AM   US Carotid Bilateral  Result Date: 09/13/2020 CLINICAL DATA:  Preop CHF Hypertension Diabetes EXAM: BILATERAL CAROTID DUPLEX ULTRASOUND TECHNIQUE: Pearline Cables scale imaging, color Doppler and duplex ultrasound were performed of bilateral carotid and vertebral arteries in the neck. COMPARISON:  None. FINDINGS: Criteria: Quantification of carotid stenosis is based on velocity parameters that correlate the residual internal carotid diameter with NASCET-based stenosis levels, using the diameter of the distal internal carotid lumen as the denominator for stenosis measurement. The following velocity measurements were obtained: RIGHT ICA: 48/17 cm/sec CCA: 32/3 cm/sec SYSTOLIC ICA/CCA RATIO:  0.9 ECA: 54 cm/sec LEFT ICA: 44/8 cm/sec CCA: 55/73 cm/sec SYSTOLIC ICA/CCA RATIO:  0.7 ECA: 50 cm/sec RIGHT CAROTID ARTERY: Mild heterogeneous plaque present in the carotid bulb without high-grade stenosis. RIGHT VERTEBRAL ARTERY:  Antegrade flow. LEFT CAROTID ARTERY: No significant plaque. No significant stenosis. LEFT VERTEBRAL ARTERY:  Antegrade flow. IMPRESSION: Significant stenosis of internal carotid arteries. Electronically Signed   By: Miachel Roux M.D.   On: 09/13/2020 11:03   CT CORONARY MORPH W/CTA COR W/SCORE W/CA W/CM &/OR WO/CM  Addendum Date: 09/20/2020   ADDENDUM REPORT: 09/20/2020 21:22 CLINICAL DATA:  Severe Aortic Stenosis. EXAM: Cardiac TAVR CT TECHNIQUE: The patient was scanned on a [employer]. A 140 kV retrospective scan was triggered in the descending thoracic aorta at 111 HU's. Gantry rotation speed was 250 msecs and collimation was .6 mm. No beta blockade or nitro were given. The 3D data set was reconstructed in 5% intervals of the R-R cycle. Systolic and diastolic phases were analyzed on a dedicated work station using MPR, MIP and VRT modes. The patient received 80 cc of  contrast. FINDINGS: Image quality: Average. Noise artifact is: Mild. Valve Morphology: Tricuspid aortic valve with severely calcified leaflets with severely restricted movement in systole. The leaflets are  all diffusely calcified. Aortic Valve Calcium score: 6553 Aortic annular dimension: Phase assessed: 30% Annular area: 610 mm2 Annular perimeter: 91.7 mm Max diameter: 34.2 mm Min diameter: 23.8 mm Annular and subannular calcification: None. Optimal coplanar projection: LAO 4 CRA 2 Coronary Artery Height above Annulus: Left Main: 10.3 mm Right Coronary: 14.8 mm Sinus of Valsalva Measurements: Non-coronary: 21.3 mm Right-coronary: 22.6 mm Left-coronary: 19.3 mm Sinus of Valsalva Height: Non-coronary: 38 mm Right-coronary: 35 mm Left-coronary: 38 mm Sinotubular Junction: 37 mm Ascending Thoracic Aorta: 38 mm (measured using double oblique technique). Coronary Arteries: Normal coronary origin. Right dominance. The study was performed without use of NTG and is insufficient for plaque evaluation. Please refer to recent cardiac catheterization for coronary assessment. 3-vessel coronary calcifications noted. Cardiac Morphology: Right Atrium: Right atrial size is within normal limits. Right Ventricle: The right ventricular cavity is dilated. Left Atrium: Left atrial size is normal in size with no left atrial appendage filling defect. Left Ventricle: The ventricular cavity size is within normal limits. There are no stigmata of prior infarction. There is no abnormal filling defect. Left ventricular function is moderately reduced, 36%. There is global hypokinesis. Pulmonary arteries: Dilated suggestive of pulmonary hypertension. Pulmonary veins: Normal pulmonary venous drainage. Pericardium: Normal thickness with no significant effusion or calcium present. Mitral Valve: The mitral valve is normal structure with moderate mitral annular calcification. Extra-cardiac findings: See attached radiology report for non-cardiac  structures. IMPRESSION: 1. 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 2. Annular measurements appropriate for 29 mm Sapien 3 TAVR (610 mm2). 3. No significant annular or subannular calcifications. 4. Borderline LCA height, 10.3 mm.  Sufficient RCA height, 14.8 mm. 5. Optimal Fluoroscopic Angle for Delivery: LAO 4 CRA 2 6. Dilated pulmonary artery suggestive of pulmonary hypertension. 7. Moderately reduced LV function, EF 36%.  Global hypokinesis. [place] T. Audie Box, MD Electronically Signed   By: Eleonore Chiquito   On: 09/20/2020 21:22   Result Date: 09/20/2020 EXAM: OVER-READ INTERPRETATION  CT CHEST The following report is an over-read performed by radiologist Dr. Salvatore Marvel of [facility], [place] on 09/20/2020. This over-read does not include interpretation of cardiac or coronary anatomy or pathology. The coronary CTA interpretation by the cardiologist is attached. COMPARISON:  10/05/2020 chest radiograph. FINDINGS: Please see the separate concurrent chest CT angiogram report for details. IMPRESSION: Please see the separate concurrent chest CT angiogram report for details. Electronically Signed: By: Ilona Sorrel M.D. On: 09/20/2020 13:59   DG Chest Port 1 View  Result Date: 09/14/2020 CLINICAL DATA:  Progressively worsening shortness of breath and bilateral leg swelling. Vomiting. Hypotension. EXAM: PORTABLE CHEST 1 VIEW COMPARISON:  09/11/2020. FINDINGS: Trachea is midline. Heart is enlarged. Lungs are somewhat low in volume with mixed interstitial and airspace opacification, similar to minimally worsened from 09/11/2020. There may be small bilateral pleural effusions. IMPRESSION: Congestive heart failure. Electronically Signed   By: Lorin Picket M.D.   On: 09/14/2020 11:41   CT ANGIO CHEST AORTA W/CM & OR WO/CM  Result Date: 09/20/2020 CLINICAL DATA:  Inpatient. Severe aortic stenosis. Pre-TAVR planning. EXAM: CT ANGIOGRAPHY CHEST, ABDOMEN AND PELVIS TECHNIQUE:  Multidetector CT imaging through the chest, abdomen and pelvis was performed using the standard protocol during bolus administration of intravenous contrast. Multiplanar reconstructed images and MIPs were obtained and reviewed to evaluate the vascular anatomy. CONTRAST:  20mL OMNIPAQUE IOHEXOL 350 MG/ML SOLN COMPARISON:  09/15/2020 chest radiograph. FINDINGS: CTA CHEST FINDINGS Cardiovascular: Mild cardiomegaly. No significant pericardial effusion/thickening. Diffusely thickened and coarsely  calcified aortic valve. Three-vessel coronary atherosclerosis. Left PICC terminates at the cavoatrial junction. 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 Dilated main pulmonary artery (4.0 cm diameter). No central pulmonary emboli. Mediastinum/Nodes: Coarse calcified 1.6 cm right thyroid nodule. Unremarkable esophagus. No pathologically enlarged axillary, mediastinal or hilar lymph nodes. Lungs/Pleura: No pneumothorax. Small dependent bilateral pleural effusions, right greater than left. Patchy mild hazy ground-glass opacity in both lungs with mild mosaic attenuation and faint interlobular septal thickening. Moderate passive atelectasis in the dependent lower lobes bilaterally. No lung masses or significant pulmonary nodules in the aerated portions of the lungs. Musculoskeletal: No aggressive appearing focal osseous lesions. Soft tissue anchors in the left humeral head. Marked thoracic spondylosis. Hypodense superficial subcutaneous 1.8 cm lesion in the medial ventral left chest wall (series 4/image 53), nonspecific, potentially a sebaceous cyst. CTA ABDOMEN AND PELVIS FINDINGS Hepatobiliary: Suggestion of a finely irregular liver surface, cannot exclude cirrhosis. No liver masses. Cholelithiasis. No gallbladder wall thickening. No biliary ductal dilatation. Pancreas: Normal, with no mass or duct dilation. Spleen: Mild splenomegaly. Craniocaudal splenic length 15.5 cm. No splenic masses.  Adrenals/Urinary Tract: Normal right adrenal. Left adrenal 2.9 cm nodule with density -5 HU, compatible with a benign adenoma. Heterogeneous hyperdense 1.7 cm exophytic renal cortical mass in the anterior lower left kidney (series 4/image 140). Simple 1.7 cm posterior lower right renal cyst. Scattered moderate renal cortical scarring throughout the left kidney. No hydronephrosis. Bladder collapsed by indwelling Foley catheter, limiting evaluation. Stomach/Bowel: Normal non-distended stomach. Normal caliber small bowel with no small bowel wall thickening. Appendix not discretely visualized. Normal large bowel with no diverticulosis, large bowel wall thickening or pericolonic fat stranding. Vascular/Lymphatic: Atherosclerotic nonaneurysmal abdominal aorta. Small paraumbilical varix. No pathologically enlarged lymph nodes in the abdomen or pelvis. Reproductive: Top-normal size prostate. Other: No pneumoperitoneum, ascites or focal fluid collection. Musculoskeletal: No aggressive appearing focal osseous lesions. Marked lumbar spondylosis. VASCULAR MEASUREMENTS PERTINENT TO TAVR: AORTA: Minimal Aortic Diameter-19.2 x 16.9 mm Severity of Aortic Calcification-moderate RIGHT PELVIS: Right Common Iliac Artery - Minimal Diameter-13.1 x 11.7 mm Tortuosity-mild Calcification-mild Right External Iliac Artery - Minimal Diameter-10.0 x 9.6 mm Tortuosity-severe Calcification-none Right Common Femoral Artery - Minimal Diameter-9.5 x 9.0 mm Tortuosity-mild Calcification-mild LEFT PELVIS: Left Common Iliac Artery - Minimal Diameter-12.6 x 11.7 mm Tortuosity-moderate Calcification-mild Left External Iliac Artery - Minimal Diameter-9.2 x 8.9 mm Tortuosity-mild Calcification-none Left Common Femoral Artery - Minimal Diameter-9.2 x 9.0 mm Tortuosity-mild Calcification-none Review of the MIP images confirms the above findings. IMPRESSION: 1. Vascular findings and measurements pertinent to potential TAVR procedure, as detailed. 2. Diffusely  thickening and coarsely calcified aortic valve, compatible with reported history of severe aortic stenosis. 3. Mild cardiomegaly. Three-vessel coronary atherosclerosis. 4. Dilated main pulmonary artery, suggesting pulmonary arterial hypertension. 5. 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 Recommend annual imaging followup by CTA or MRA. This recommendation follows 2010 ACCF/AHA/AATS/ACR/ASA/SCA/SCAI/SIR/STS/SVM Guidelines for the Diagnosis and Management of Patients with Thoracic Aortic Disease. Circulation. 2010; 121: X937-J696. Aortic aneurysm NOS (ICD10-I71.9). 6. Patchy mild hazy ground-glass opacity in both lungs with faint interlobular septal thickening, favor mild pulmonary edema. Small dependent bilateral pleural effusions, right greater than left. 7. Exophytic heterogeneous hyperdense 1.7 cm renal cortical mass in anterior lower left kidney, renal cell carcinoma not excluded. MRI or CT abdomen without and with IV contrast is indicated for further evaluation. 8. Suggestion of a finely irregular liver surface, cannot exclude cirrhosis. No liver masses. Consider hepatic elastography for further liver fibrosis risk stratification, as clinically warranted. 9.  Mild splenomegaly. Small paraumbilical varix. No ascites. 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 11. Cholelithiasis. 12. Aortic Atherosclerosis (ICD10-I70.0). Electronically Signed   By: Ilona Sorrel M.D.   On: 09/20/2020 14:38   ECHOCARDIOGRAM COMPLETE  Result Date: 09/26/2020    ECHOCARDIOGRAM REPORT   Patient Name:   Brett Williamson Date of Exam: 09/26/2020 Medical Rec #:  [id number]    Height:       71.0 in Accession #:    [id number]   Weight:       239.4 lb Date of Birth:  Sep 18, 1940     BSA:          2.276 m Patient Age:    60 years     BP:           134/82 mmHg Patient Gender: M            HR:           81 bpm. Exam Location:  Inpatient Procedure: 2D Echo, Cardiac Doppler and Color Doppler Indications:    Post TAVR Evaluation V43.3/Z95.2  History:        Patient  has prior history of Echocardiogram examinations, most                 recent 09/25/2020. CHF, Aortic Valve Disease, Arrythmias:RBBB;                 Risk Factors:Hypertension, Diabetes and Sleep Apnea. CKD.                 Aortic Valve: 29 mm Edwards Sapien prosthetic, stented (TAVR)                 valve is present in the aortic position. Procedure Date:                 09/25/2020.  Sonographer:    Jonelle Sidle Dance Referring Phys: [id number] [place]  1. Left ventricular ejection fraction, by estimation, is 40 to 45%. The left ventricle has mildly decreased function. The left ventricle demonstrates regional wall motion abnormalities (see scoring diagram/findings for description). There is mild concentric left ventricular hypertrophy. Left ventricular diastolic parameters are consistent with Grade II diastolic dysfunction (pseudonormalization). Elevated left atrial pressure. There is moderate hypokinesis of the left ventricular, entire lateral wall. There is moderate hypokinesis of the left ventricular, mid-apical anterior wall.  2. Right ventricular systolic function is normal. The right ventricular size is normal.  3. Left atrial size was severely dilated.  4. The mitral valve is degenerative. Mild mitral valve regurgitation. Mild mitral stenosis. The mean mitral valve gradient is 3.6 mmHg with average heart rate of 73 bpm. Moderate mitral annular calcification.  5. The aortic valve has been repaired/replaced. Aortic valve regurgitation is not visualized. There is a 29 mm Edwards Sapien prosthetic (TAVR) valve present in the aortic position. Procedure Date: 09/25/2020. Echo findings are consistent with normal structure and function of the aortic valve prosthesis. Aortic valve mean gradient measures 11.5 mmHg. Aortic valve Vmax measures 2.33 m/s. Acceleration time 67 ms. FINDINGS  Left Ventricle: Left ventricular ejection fraction, by estimation, is 40 to 45%. The left ventricle has mildly decreased  function. The left ventricle demonstrates regional wall motion abnormalities. Moderate hypokinesis of the left ventricular, entire lateral wall. Moderate hypokinesis of the left ventricular, mid-apical anterior wall. The left ventricular internal cavity size was normal in size. There is mild concentric left ventricular hypertrophy. Left ventricular diastolic parameters are consistent with Grade II diastolic dysfunction (pseudonormalization). Elevated  left atrial pressure. Right Ventricle: The right ventricular size is normal. No increase in right ventricular wall thickness. Right ventricular systolic function is normal. Left Atrium: Left atrial size was severely dilated. Right Atrium: Right atrial size was normal in size. Pericardium: There is no evidence of pericardial effusion. Mitral Valve: The mitral valve is degenerative in appearance. There is mild thickening of the mitral valve leaflet(s). Moderate mitral annular calcification. Mild mitral valve regurgitation. Mild mitral valve stenosis. The mean mitral valve gradient is 3.6 mmHg with average heart rate of 73 bpm. Tricuspid Valve: The tricuspid valve is normal in structure. Tricuspid valve regurgitation is not demonstrated. Aortic Valve: The aortic valve has been repaired/replaced. Aortic valve regurgitation is not visualized. Aortic valve mean gradient measures 11.5 mmHg. Aortic valve peak gradient measures 21.6 mmHg. Aortic valve area, by VTI measures 1.25 cm. There is a  29 mm Edwards Sapien prosthetic, stented (TAVR) valve present in the aortic position. Procedure Date: 09/25/2020. Echo findings are consistent with normal structure and function of the aortic valve prosthesis. Acceleration time 67 ms. Pulmonic Valve: The pulmonic valve was normal in structure. Pulmonic valve regurgitation is trivial. Aorta: The aortic root and ascending aorta are structurally normal, with no evidence of dilitation. IAS/Shunts: No atrial level shunt detected by color flow  Doppler.  LEFT VENTRICLE PLAX 2D LVIDd:         5.60 cm  Diastology LVIDs:         4.00 cm  LV e' medial:    4.57 cm/s LV PW:         1.36 cm  LV E/e' medial:  24.9 LV IVS:        1.20 cm  LV e' lateral:   5.66 cm/s LVOT diam:     2.10 cm  LV E/e' lateral: 20.1 LV SV:         54 LV SV Index:   24 LVOT Area:     3.46 cm  RIGHT VENTRICLE            IVC RV Basal diam:  3.30 cm    IVC diam: 2.20 cm RV S prime:     7.94 cm/s TAPSE (M-mode): 2.0 cm LEFT ATRIUM             Index       RIGHT ATRIUM           Index LA diam:        5.60 cm 2.46 cm/m  RA Area:     16.70 cm LA Vol (A2C):   83.1 ml 36.51 ml/m RA Volume:   45.80 ml  20.12 ml/m LA Vol (A4C):   83.6 ml 36.73 ml/m LA Biplane Vol: 88.5 ml 38.88 ml/m  AORTIC VALVE AV Area (Vmax):    1.18 cm AV Area (Vmean):   1.23 cm AV Area (VTI):     1.25 cm AV Vmax:           232.50 cm/s AV Vmean:          158.500 cm/s AV VTI:            0.429 m AV Peak Grad:      21.6 mmHg AV Mean Grad:      11.5 mmHg LVOT Vmax:         79.20 cm/s LVOT Vmean:        56.100 cm/s LVOT VTI:          0.155 m LVOT/AV VTI ratio: 0.36  AORTA Ao Root  diam: 3.14 cm Ao Asc diam:  2.50 cm MITRAL VALVE MV Area (PHT): 2.29 cm     SHUNTS MV Area VTI:   1.68 cm     Systemic VTI:  0.16 m MV Mean grad:  3.6 mmHg     Systemic Diam: 2.10 cm MV VTI:        0.32 m MV Decel Time: 331 msec MV E velocity: 114.00 cm/s MV A velocity: 90.80 cm/s MV E/A ratio:  1.26 Mihai Croitoru MD Electronically signed by Sanda Klein MD Signature Date/Time: 09/26/2020/2:18:16 PM    Final    ECHOCARDIOGRAM COMPLETE  Result Date: 09/12/2020    ECHOCARDIOGRAM REPORT   Patient Name:   Brett Williamson Date of Exam: 09/12/2020 Medical Rec #:  [id number]    Height:       71.0 in Accession #:    [id number]   Weight:       258.0 lb Date of Birth:  06/11/1941     BSA:          2.350 m Patient Age:    60 years     BP:           107/77 mmHg Patient Gender: M            HR:           85 bpm. Exam Location:  [facility] Procedure: 2D Echo  Indications:    Congestive Heart Failure I50.9  History:        Patient has prior history of Echocardiogram examinations, most                 recent 06/20/2020. CHF; Risk Factors:Hypertension, Diabetes and                 Non-Smoker. Elevated Troponin.  Sonographer:    [name] RDCS (AE) Referring Phys: [place]  1. Left ventricular ejection fraction, by estimation, is 35 to 40%. The left ventricle has moderately decreased function. The left ventricle demonstrates regional wall motion abnormalities (see scoring diagram/findings for description). There is mild left ventricular hypertrophy. Left ventricular diastolic parameters are consistent with Grade II diastolic dysfunction (pseudonormalization).  2. Right ventricular systolic function is severely reduced. The right ventricular size is normal. Tricuspid regurgitation signal is inadequate for assessing PA pressure.  3. The mitral valve is abnormal. Mild mitral valve regurgitation. Moderate mitral annular calcification.  4. The aortic valve has an indeterminant number of cusps. There is severe calcifcation of the aortic valve. Aortic valve regurgitation is not visualized. Severe-critical aortic valve stenosis. Aortic valve mean gradient measures 65.7 mmHg. Aortic valve Vmax measures 4.89 m/s.  5. The inferior vena cava is normal in size with <50% respiratory variability, suggesting right atrial pressure of 8 mmHg. FINDINGS  Left Ventricle: Left ventricular ejection fraction, by estimation, is 35 to 40%. The left ventricle has moderately decreased function. The left ventricle demonstrates regional wall motion abnormalities. The left ventricular internal cavity size was normal in size. There is mild left ventricular hypertrophy. Left ventricular diastolic parameters are consistent with Grade II diastolic dysfunction (pseudonormalization).  LV Wall Scoring: The anterior wall, entire lateral wall, and basal inferior segment are akinetic.  The mid and distal anterior septum, apical anterior segment, basal inferoseptal segment, and apex are hypokinetic. The mid and distal inferior wall, basal anteroseptal segment, and mid inferoseptal segment are normal. Right Ventricle: The right ventricular size is normal. No increase in right ventricular wall thickness. Right ventricular systolic function is severely  reduced. Tricuspid regurgitation signal is inadequate for assessing PA pressure. Left Atrium: Left atrial size was normal in size. Right Atrium: Right atrial size was normal in size. Pericardium: There is no evidence of pericardial effusion. Mitral Valve: The mitral valve is abnormal. There is mild thickening of the mitral valve leaflet(s). There is mild calcification of the mitral valve leaflet(s). Moderate mitral annular calcification. Mild mitral valve regurgitation. Tricuspid Valve: The tricuspid valve is grossly normal. Tricuspid valve regurgitation is trivial. Aortic Valve: The aortic valve has an indeterminant number of cusps. There is severe calcifcation of the aortic valve. Aortic valve regurgitation is not visualized. Severe aortic stenosis is present. Aortic valve mean gradient measures 65.7 mmHg. Aortic valve peak gradient measures 95.8 mmHg. Aortic valve area, by VTI measures 0.24 cm. Pulmonic Valve: The pulmonic valve was grossly normal. Pulmonic valve regurgitation is mild. Aorta: The aortic root is normal in size and structure. Venous: The inferior vena cava is normal in size with less than 50% respiratory variability, suggesting right atrial pressure of 8 mmHg. IAS/Shunts: No atrial level shunt detected by color flow Doppler.  LEFT VENTRICLE PLAX 2D LVIDd:         5.57 cm  Diastology LVIDs:         4.63 cm  LV e' medial:    3.81 cm/s LV PW:         1.24 cm  LV E/e' medial:  29.7 LV IVS:        1.25 cm  LV e' lateral:   4.95 cm/s LVOT diam:     1.90 cm  LV E/e' lateral: 22.8 LV SV:         28 LV SV Index:   12 LVOT Area:     2.84 cm   RIGHT VENTRICLE RV S prime:     10.10 cm/s TAPSE (M-mode): 2.4 cm LEFT ATRIUM             Index       RIGHT ATRIUM           Index LA diam:        4.80 cm 2.04 cm/m  RA Area:     14.60 cm LA Vol (A2C):   85.4 ml 36.35 ml/m RA Volume:   40.90 ml  17.41 ml/m LA Vol (A4C):   69.9 ml 29.75 ml/m LA Biplane Vol: 83.4 ml 35.50 ml/m  AORTIC VALVE AV Area (Vmax):    0.27 cm AV Area (Vmean):   0.24 cm AV Area (VTI):     0.24 cm AV Vmax:           489.33 cm/s AV Vmean:          386.667 cm/s AV VTI:            1.163 m AV Peak Grad:      95.8 mmHg AV Mean Grad:      65.7 mmHg LVOT Vmax:         46.97 cm/s LVOT Vmean:        33.167 cm/s LVOT VTI:          0.099 m LVOT/AV VTI ratio: 0.09  AORTA Ao Root diam: 3.10 cm MITRAL VALVE MV Area (PHT): 3.13 cm     SHUNTS MV Decel Time: 242 msec     Systemic VTI:  0.10 m MR Peak grad: 105.3 mmHg    Systemic Diam: 1.90 cm MR Mean grad: 68.0 mmHg MR Vmax:      513.00 cm/s MR Vmean:  379.0 cm/s MV E velocity: 113.00 cm/s MV A velocity: 79.30 cm/s MV E/A ratio:  1.42 Rozann Lesches MD Electronically signed by Rozann Lesches MD Signature Date/Time: 09/12/2020/2:29:55 PM    Final    ECHOCARDIOGRAM LIMITED  Result Date: 09/25/2020    ECHOCARDIOGRAM LIMITED REPORT   Patient Name:   Brett Williamson Date of Exam: 09/25/2020 Medical Rec #:  [id number]    Height:       71.0 in Accession #:    [id number]   Weight:       237.2 lb Date of Birth:  Oct 11, 1940     BSA:          2.267 m Patient Age:    25 years     BP:           140/72 mmHg Patient Gender: M            HR:           90 bpm. Exam Location:  Inpatient Procedure: Limited Echo, Color Doppler and Cardiac Doppler Indications:     Aortic Stenosis i35.0  History:         Patient has prior history of Echocardiogram examinations, most                  recent 09/12/2020. CHF; Risk Factors:Hypertension and Diabetes.                  Aortic Valve: 29 mm Edwards Sapien prosthetic, stented (TAVR)                  valve is present in the aortic  position. Procedure Date:                  09/25/2020.  Sonographer:     Raquel Sarna Senior RDCS Referring Phys:  [id number] Eileen Stanford Diagnosing Phys: Sanda Klein MD  Sonographer Comments: 59mm Edwards Sapien 3 Ultra TAVR PRE-PROCEDURE FINDINGS Moderately-to-severely depressed left ventricular systolic function, estimated EF 30%. Probably trileaflet aortic valve. Moderate to severe calcific aortic stenosis. No aortic insufficiency. Peak aortic gradient 108 mm Hg, mean gradient 84 mm Hg, dimensionless index 0.09, calculated valve area 0.3cm (0.13 cm/m indexed for BSA). Acceleration time 116 ms. Mild mitral insufficiency. No pericardial effusion. POST-PROCEDURE FINDINGS Mildly reduced left ventricular systolic function, estimated EF 45%. Well seated 32mm Edwards Sapien 3 Ultra TAVR stent valve (TAVR). There is a trivial perivalvular leak. Peak aortic gradient 10 mm Hg, mean gradient 5 mm Hg, dimensionless index 0.56, calculated valve area 2.21 cm (0.97 cm/m indexed for BSA). Acceleration time 74 ms. Trivial mitral insufficiency. No pericardial effusion. IMPRESSIONS  1. Left ventricular ejection fraction, by estimation, is 40 to 45%. The left ventricle has mildly decreased function. The left ventricle demonstrates global hypokinesis. There is moderate concentric left ventricular hypertrophy.  2. Right ventricular systolic function is normal. The right ventricular size is normal.  3. Mild mitral valve regurgitation. Moderate mitral annular calcification.  4. The aortic valve has been repaired/replaced. There is a 29 mm Edwards Sapien prosthetic (TAVR) valve present in the aortic position. Procedure Date: 09/25/2020. Echo findings are consistent with normal structure and function of the aortic valve prosthesis. FINDINGS  Left Ventricle: Left ventricular ejection fraction, by estimation, is 40 to 45%. The left ventricle has mildly decreased function. The left ventricle demonstrates global hypokinesis. The left  ventricular internal cavity size was normal in size. There is  moderate concentric left ventricular hypertrophy. Right Ventricle: The right ventricular size is normal.  No increase in right ventricular wall thickness. Right ventricular systolic function is normal. Left Atrium: Left atrial size was normal in size. Right Atrium: Right atrial size was normal in size. Pericardium: There is no evidence of pericardial effusion. Mitral Valve: There is mild thickening of the mitral valve leaflet(s). Moderate mitral annular calcification. Mild mitral valve regurgitation. Aortic Valve: The aortic valve has been repaired/replaced. Aortic valve mean gradient measures 5.0 mmHg. Aortic valve peak gradient measures 10.0 mmHg. Aortic valve area, by VTI measures 2.21 cm. There is a 29 mm Edwards Sapien prosthetic, stented (TAVR) valve present in the aortic position. Procedure Date: 09/25/2020. Echo findings are consistent with normal structure and function of the aortic valve prosthesis. Pulmonic Valve: The pulmonic valve was not assessed. Aorta: The aortic root is normal in size and structure. IAS/Shunts: The interatrial septum was not assessed. LEFT VENTRICLE PLAX 2D LVOT diam:     2.10 cm LV SV:         55 LV SV Index:   24 LVOT Area:     3.46 cm  AORTIC VALVE AV Area (Vmax):    1.93 cm AV Area (Vmean):   2.37 cm AV Area (VTI):     2.21 cm AV Vmax:           158.00 cm/s AV Vmean:          100.000 cm/s AV VTI:            0.249 m AV Peak Grad:      10.0 mmHg AV Mean Grad:      5.0 mmHg LVOT Vmax:         88.10 cm/s LVOT Vmean:        68.500 cm/s LVOT VTI:          0.159 m LVOT/AV VTI ratio: 0.64  SHUNTS Systemic VTI:  0.16 m Systemic Diam: 2.10 cm Dani Gobble Croitoru MD Electronically signed by Sanda Klein MD Signature Date/Time: 09/25/2020/12:11:51 PM    Final    [place] EKG SITE RITE  Result Date: 09/14/2020 If Site Rite image not attached, placement could not be confirmed due to current cardiac rhythm.  Structural Heart  Procedure  Result Date: 09/25/2020 See surgical note for result.  CT Angio Abd/Pel w/ and/or w/o  Result Date: 09/20/2020 CLINICAL DATA:  Inpatient. Severe aortic stenosis. Pre-TAVR planning. EXAM: CT ANGIOGRAPHY CHEST, ABDOMEN AND PELVIS TECHNIQUE: Multidetector CT imaging through the chest, abdomen and pelvis was performed using the standard protocol during bolus administration of intravenous contrast. Multiplanar reconstructed images and MIPs were obtained and reviewed to evaluate the vascular anatomy. CONTRAST:  14mL OMNIPAQUE IOHEXOL 350 MG/ML SOLN COMPARISON:  09/15/2020 chest radiograph. FINDINGS: CTA CHEST FINDINGS Cardiovascular: Mild cardiomegaly. No significant pericardial effusion/thickening. Diffusely thickened and coarsely calcified aortic valve. Three-vessel coronary atherosclerosis. Left PICC terminates at the cavoatrial junction. 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 Dilated main pulmonary artery (4.0 cm diameter). No central pulmonary emboli. Mediastinum/Nodes: Coarse calcified 1.6 cm right thyroid nodule. Unremarkable esophagus. No pathologically enlarged axillary, mediastinal or hilar lymph nodes. Lungs/Pleura: No pneumothorax. Small dependent bilateral pleural effusions, right greater than left. Patchy mild hazy ground-glass opacity in both lungs with mild mosaic attenuation and faint interlobular septal thickening. Moderate passive atelectasis in the dependent lower lobes bilaterally. No lung masses or significant pulmonary nodules in the aerated portions of the lungs. Musculoskeletal: No aggressive appearing focal osseous lesions. Soft tissue anchors in the left humeral head. Marked thoracic spondylosis. Hypodense superficial subcutaneous 1.8 cm  lesion in the medial ventral left chest wall (series 4/image 53), nonspecific, potentially a sebaceous cyst. CTA ABDOMEN AND PELVIS FINDINGS Hepatobiliary: Suggestion of a finely irregular liver surface,  cannot exclude cirrhosis. No liver masses. Cholelithiasis. No gallbladder wall thickening. No biliary ductal dilatation. Pancreas: Normal, with no mass or duct dilation. Spleen: Mild splenomegaly. Craniocaudal splenic length 15.5 cm. No splenic masses. Adrenals/Urinary Tract: Normal right adrenal. Left adrenal 2.9 cm nodule with density -5 HU, compatible with a benign adenoma. Heterogeneous hyperdense 1.7 cm exophytic renal cortical mass in the anterior lower left kidney (series 4/image 140). Simple 1.7 cm posterior lower right renal cyst. Scattered moderate renal cortical scarring throughout the left kidney. No hydronephrosis. Bladder collapsed by indwelling Foley catheter, limiting evaluation. Stomach/Bowel: Normal non-distended stomach. Normal caliber small bowel with no small bowel wall thickening. Appendix not discretely visualized. Normal large bowel with no diverticulosis, large bowel wall thickening or pericolonic fat stranding. Vascular/Lymphatic: Atherosclerotic nonaneurysmal abdominal aorta. Small paraumbilical varix. No pathologically enlarged lymph nodes in the abdomen or pelvis. Reproductive: Top-normal size prostate. Other: No pneumoperitoneum, ascites or focal fluid collection. Musculoskeletal: No aggressive appearing focal osseous lesions. Marked lumbar spondylosis. VASCULAR MEASUREMENTS PERTINENT TO TAVR: AORTA: Minimal Aortic Diameter-19.2 x 16.9 mm Severity of Aortic Calcification-moderate RIGHT PELVIS: Right Common Iliac Artery - Minimal Diameter-13.1 x 11.7 mm Tortuosity-mild Calcification-mild Right External Iliac Artery - Minimal Diameter-10.0 x 9.6 mm Tortuosity-severe Calcification-none Right Common Femoral Artery - Minimal Diameter-9.5 x 9.0 mm Tortuosity-mild Calcification-mild LEFT PELVIS: Left Common Iliac Artery - Minimal Diameter-12.6 x 11.7 mm Tortuosity-moderate Calcification-mild Left External Iliac Artery - Minimal Diameter-9.2 x 8.9 mm Tortuosity-mild Calcification-none Left  Common Femoral Artery - Minimal Diameter-9.2 x 9.0 mm Tortuosity-mild Calcification-none Review of the MIP images confirms the above findings. IMPRESSION: 1. Vascular findings and measurements pertinent to potential TAVR procedure, as detailed. 2. Diffusely thickening and coarsely calcified aortic valve, compatible with reported history of severe aortic stenosis. 3. Mild cardiomegaly. Three-vessel coronary atherosclerosis. 4. Dilated main pulmonary artery, suggesting pulmonary arterial hypertension. 5. 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 Recommend annual imaging followup by CTA or MRA. This recommendation follows 2010 ACCF/AHA/AATS/ACR/ASA/SCA/SCAI/SIR/STS/SVM Guidelines for the Diagnosis and Management of Patients with Thoracic Aortic Disease. Circulation. 2010; 121: N470-J628. Aortic aneurysm NOS (ICD10-I71.9). 6. Patchy mild hazy ground-glass opacity in both lungs with faint interlobular septal thickening, favor mild pulmonary edema. Small dependent bilateral pleural effusions, right greater than left. 7. Exophytic heterogeneous hyperdense 1.7 cm renal cortical mass in anterior lower left kidney, renal cell carcinoma not excluded. MRI or CT abdomen without and with IV contrast is indicated for further evaluation. 8. Suggestion of a finely irregular liver surface, cannot exclude cirrhosis. No liver masses. Consider hepatic elastography for further liver fibrosis risk stratification, as clinically warranted. 9. Mild splenomegaly. Small paraumbilical varix. No ascites. 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 11. Cholelithiasis. 12. Aortic Atherosclerosis (ICD10-I70.0). Electronically Signed   By: Ilona Sorrel M.D.   On: 09/20/2020 14:38   Disposition   Pt is being discharged home today in improved condition.  Follow-up Plans & Appointments     Follow-up Information    Eileen Stanford, PA-C. Go on 10/25/2020.   Specialties: Cardiology, Radiology Why: Please arrive at 1:45 pm for your echo followed by an apt  with [employer] information: [address] [place] [id number] [phone]              Discharge Instructions    ([place]) Call MD:  Anytime you have  any of the following symptoms: 1) 3 pound weight gain in 24 hours or 5 pounds in 1 week 2) shortness of breath, with or without a dry hacking cough 3) swelling in the hands, feet or stomach 4) if you have to sleep on extra pillows at night in order to breathe.   Complete by: As directed    Call MD for:  redness, tenderness, or signs of infection (pain, swelling, redness, odor or green/yellow discharge around incision site)   Complete by: As directed    Diet - low sodium heart healthy   Complete by: As directed    Diet Carb Modified   Complete by: As directed    Discharge wound care:   Complete by: As directed    Cleanse abdominal folds with soap and water and pat dry.  Gerhardts butt paste to inguinal folds and buttocks.  Will implement moisture wicking linen (interdry Ag)  Measure and cut length of InterDry to fit in skin folds that have skin breakdown Tuck InterDry fabric into skin folds in a single layer, allow for 2 inches of overhang from skin edges to allow for wicking to occur May remove to bathe; dry area thoroughly and then tuck into affected areas again Do not apply any creams or ointments when using InterDry DO NOT THROW AWAY FOR 5 DAYS unless soiled with stool DO NOT [facility] product, this will inactivate the silver in the material  New sheet of Interdry should be applied after 5 days of use if patient continues to have skin breakdown BEdside RN to perform:   CLeanse legs with soap and water and pat dry.  Apply Xeroform gauze to blisters on left lower leg. Moisturize intact skin to both legs  Wrap both legs with kerlix from below toes to below knee.  Secure with self adherent coban Change twice weekly.  Wednesday and Saturday.   Increase activity slowly   Complete by: As  directed       Discharge Medications   Allergies as of 09/29/2020   No Known Allergies     Medication List    TAKE these medications   acetaminophen 325 MG tablet Commonly known as: TYLENOL Take 2 tablets (650 mg total) by mouth every 6 (six) hours as needed.   aspirin 81 MG EC tablet Take 1 tablet (81 mg total) by mouth daily. Swallow whole. Start taking on: September 30, 2020   clopidogrel 75 MG tablet Commonly known as: PLAVIX Take 1 tablet (75 mg total) by mouth daily with breakfast. Start taking on: September 30, 2020   Gerhardt's butt cream Crea Apply 1 application topically 2 (two) times daily as needed for irritation.   nystatin powder Commonly known as: MYCOSTATIN/NYSTOP Apply topically 3 (three) times daily.   rosuvastatin 20 MG tablet Commonly known as: CRESTOR Take 1 tablet (20 mg total) by mouth daily. Start taking on: September 30, 2020   traMADol 50 MG tablet Commonly known as: ULTRAM Take 1 tablet (50 mg total) by mouth 2 (two) times daily as needed.   triamcinolone 0.1 % Commonly known as: KENALOG Apply 1 application topically See admin instructions. Apply to affected area 2 to 3 times a week            Discharge Care Instructions  (From admission, onward)         Start     Ordered   09/29/20 0000  Discharge wound care:       Comments: Cleanse abdominal folds with soap and water  and pat dry.  Gerhardts butt paste to inguinal folds and buttocks.  Will implement moisture wicking linen (interdry Ag)  Measure and cut length of InterDry to fit in skin folds that have skin breakdown Tuck InterDry fabric into skin folds in a single layer, allow for 2 inches of overhang from skin edges to allow for wicking to occur May remove to bathe; dry area thoroughly and then tuck into affected areas again Do not apply any creams or ointments when using InterDry DO NOT THROW AWAY FOR 5 DAYS unless soiled with stool DO NOT [facility] product, this will inactivate  the silver in the material  New sheet of Interdry should be applied after 5 days of use if patient continues to have skin breakdown BEdside RN to perform:   CLeanse legs with soap and water and pat dry.  Apply Xeroform gauze to blisters on left lower leg. Moisturize intact skin to both legs  Wrap both legs with kerlix from below toes to below knee.  Secure with self adherent coban Change twice weekly.  Wednesday and Saturday.   09/29/20 1536             Outstanding Labs/Studies   None  Duration of Discharge Encounter   Greater than 30 minutes including physician time.  Signed, Rosaria Ferries, PA-C 09/29/2020, 3:36 PM

## 2020-09-29 NOTE — TOC Progression Note (Signed)
Transition of Care [facility]) - Progression Note    Patient Details  Name: Brett Williamson MRN: [id number] Date of Birth: 01/27/1941  Transition of Care [facility]) CM/SW [place], [place] Phone Number: 09/29/2020, 4:16 PM  Clinical Narrative:    CSW spoke with Juliann Pulse from [facility] concerning admission.  CSW is waiting for return phone.  TOC will continue to assist with disposition planning.   Expected Discharge Plan: [place] Barriers to Discharge: SNF Pending bed offer  Expected Discharge Plan and Services Expected Discharge Plan: [place] In-house Referral: Clinical Social Work Discharge Planning Services: CM Consult Post Acute Care Choice: [place] arrangements for the past 2 months: Apartment Expected Discharge Date: 09/29/20                           [facility] Agency: [facility] [place] Determinants of Health (SDOH) Interventions    Readmission Risk Interventions No flowsheet data found.

## 2020-09-29 NOTE — Progress Notes (Signed)
Patient ID: Brett Williamson, male   DOB: [birth year], 80 y.o.   MRN: [id number]    Cardiology Rounding Note  PCP-Cardiologist: No primary care provider on file.   Subjective:    2/15- S/P successful TAVR procedure on 2/15 with increase in EF from 30% to 45% post TAVR and with trivial perivalvular leak, Normal functioning aortic valve prosthesis    Creatinine stable at 1.4.   Co-ox 71%.  Denies any chest pain or dyspnea.   Objective:   Weight Range: 109.1 kg Body mass index is 33.55 kg/m.   Vital Signs:   Temp:  [97.8 F (36.6 C)-98.4 F (36.9 C)] 97.9 F (36.6 C) (02/19 1253) Pulse Rate:  [60-72] 60 (02/19 0800) Resp:  [18-20] 20 (02/19 1253) BP: (114-132)/(63-75) 119/63 (02/19 1253) SpO2:  [95 %-98 %] 98 % (02/19 1253) Weight:  [109.1 kg] 109.1 kg (02/19 0358) Last BM Date: 09/26/20  Weight change: Filed Weights   09/27/20 0443 09/28/20 0500 09/29/20 0358  Weight: 107.5 kg 106.5 kg 109.1 kg    Intake/Output:   Intake/Output Summary (Last 24 hours) at 09/29/2020 1428 Last data filed at 09/29/2020 1212 Gross per 24 hour  Intake 480 ml  Output 600 ml  Net -120 ml      Physical Exam    General:  Lying in bed  No resp difficulty General:  Well appearing. No resp difficulty HEENT: normal Neck: supple. no JVD. Carotids 2+ bilat; no bruits. No lymphadenopathy or thryomegaly appreciated. Cor: PMI nondisplaced. Regular rate & rhythm. Soft SEM RSB Lungs: clear Abdomen: soft, nontender, nondistended. No hepatosplenomegaly. No bruits or masses. Good bowel sounds. Extremities: no cyanosis, clubbing, rash, edema, eschars of toes  Neuro: alert & orientedx3, cranial nerves grossly intact. moves all 4 extremities w/o difficulty. Affect pleasant   Telemetry   NSR 60s Personally reviewed   Labs    CBC Recent Labs    09/28/20 0500 09/29/20 0602  WBC 8.4 8.8  HGB 13.7 13.7  HCT 42.6 44.4  MCV 77.3* 79.9*  PLT 178 409   Basic Metabolic Panel Recent Labs     09/28/20 0500 09/29/20 0602  NA 131* 135  K 3.8 4.2  CL 96* 102  CO2 25 24  GLUCOSE 119* 115*  BUN 42* 37*  CREATININE 1.44* 1.41*  CALCIUM 8.4* 8.6*   Liver Function Tests No results for input(s): AST, ALT, ALKPHOS, BILITOT, PROT, ALBUMIN in the last 72 hours. No results for input(s): LIPASE, AMYLASE in the last 72 hours. Cardiac Enzymes No results for input(s): CKTOTAL, CKMB, CKMBINDEX, TROPONINI in the last 72 hours.  BNP: BNP (last 3 results) Recent Labs    09/11/20 1600 09/12/20 0708  BNP 1,413.0* 1,561.0*    ProBNP (last 3 results) No results for input(s): PROBNP in the last 8760 hours.   D-Dimer No results for input(s): DDIMER in the last 72 hours. Hemoglobin A1C No results for input(s): HGBA1C in the last 72 hours. Fasting Lipid Panel No results for input(s): CHOL, HDL, LDLCALC, TRIG, CHOLHDL, LDLDIRECT in the last 72 hours. Thyroid Function Tests No results for input(s): TSH, T4TOTAL, T3FREE, THYROIDAB in the last 72 hours.  Invalid input(s): FREET3  Other results:  R/LHC results 09/18/20:   1. Diffuse mild non-obstructive CAD 2. Critical calcific aortic stenosis 3. Markedly elevated filling pressure with normal cardiac output on dobutamine 4. 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   Medications:     Scheduled Medications: . aspirin EC  81 mg Oral Daily  . Chlorhexidine Gluconate Cloth  6 each Topical Daily  . clopidogrel  75 mg Oral Q breakfast  . insulin aspart  0-15 Units Subcutaneous TID WC  . insulin aspart  0-5 Units Subcutaneous QHS  . mouth rinse  15 mL Mouth Rinse BID  . nystatin   Topical TID  . rosuvastatin  20 mg Oral Daily  . sodium chloride flush  10-40 mL Intracatheter Q12H  . sodium chloride flush  3 mL Intravenous Q12H    Infusions: . sodium chloride    . cefTRIAXone (ROCEPHIN)  IV 1 g (09/29/20 1313)    PRN Medications: sodium chloride, acetaminophen **OR** acetaminophen, Gerhardt's  butt cream, guaiFENesin-dextromethorphan, morphine injection, ondansetron (ZOFRAN) IV, oxyCODONE, sodium chloride flush, sodium chloride flush, traMADol    Assessment/Plan   1. 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  ECHO 09/12/20 with EF down to 35-40%, worsening of AVA to 0.24 cm2 and mean gradient now 65.7 mmHg, new RWMA, new severely reduced RV function.  Likely due to worsening AS He has diffuse non obstructive CAD by LHC on 09/18/20.     - co-ox 70%, no longer on dobutamine - EF 45-50% - Plan per Advanced Heart Failure was to try spiro or add ARB in near future as outpatient. No SGLT2i with severe groin yeast on admit.  He was given IVF yesterday  2. Severe Aortic Stenosis:  Critical stenosis, significantly progressed since 06/2020. By echo, worsening of AVA to 0.24 cm2 and mean gradient now 65.7 mmHg - s/p Successful TAVR 2/15,  - Echo 2/16 EF 45-50% with AVGm 73mmHG  3. AKI on CKD 3b: Baseline cr around 1.6. peaked at 2.36   - Creatinine stable 1.4 - Off dobutamine post TAVR procedure - Hold diuretics  4. 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 Hx of Traumatic subdural hematoma: 11/21 with craniotomy/evacuation.  He is on SCDs for now for DVT prophylaxis.   - continue low dose lovenox  6. Renal mass on CT/Irregular liver surface - MRI complete. 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 Iron deficiency - Received Feraheme 2/12  8. Deconditioning - this is major issue. Patient and wife do not think they can go home safely - PT/OT recommending HHPT initially - Pending SNF placement   9. LE wounds with surrounding erythema -covering for cellulitis with IV ceftriaxone also rec'd vanc and cefuroxime for  procedure - has completed 7 day course, will discontinue  Disposition: planning discharge to SNF today  Length of Stay: 18  Donato Heinz, MD  09/29/2020, 2:28 PM

## 2020-10-01 LAB — POCT I-STAT, CHEM 8
BUN: 44 mg/dL — ABNORMAL HIGH (ref 8–23)
BUN: 45 mg/dL — ABNORMAL HIGH (ref 8–23)
Calcium, Ion: 1.17 mmol/L (ref 1.15–1.40)
Calcium, Ion: 1.21 mmol/L (ref 1.15–1.40)
Chloride: 97 mmol/L — ABNORMAL LOW (ref 98–111)
Chloride: 99 mmol/L (ref 98–111)
Creatinine, Ser: 1.6 mg/dL — ABNORMAL HIGH (ref 0.61–1.24)
Creatinine, Ser: 1.6 mg/dL — ABNORMAL HIGH (ref 0.61–1.24)
Glucose, Bld: 129 mg/dL — ABNORMAL HIGH (ref 70–99)
Glucose, Bld: 173 mg/dL — ABNORMAL HIGH (ref 70–99)
HCT: 47 % (ref 39.0–52.0)
HCT: 51 % (ref 39.0–52.0)
Hemoglobin: 16 g/dL (ref 13.0–17.0)
Hemoglobin: 17.3 g/dL — ABNORMAL HIGH (ref 13.0–17.0)
Potassium: 4.1 mmol/L (ref 3.5–5.1)
Potassium: 4.4 mmol/L (ref 3.5–5.1)
Sodium: 134 mmol/L — ABNORMAL LOW (ref 135–145)
Sodium: 134 mmol/L — ABNORMAL LOW (ref 135–145)
TCO2: 25 mmol/L (ref 22–32)
TCO2: 28 mmol/L (ref 22–32)

## 2020-10-01 LAB — POCT I-STAT 7, (LYTES, BLD GAS, ICA,H+H)
Acid-Base Excess: 0 mmol/L (ref 0.0–2.0)
Bicarbonate: 26.9 mmol/L (ref 20.0–28.0)
Calcium, Ion: 1.17 mmol/L (ref 1.15–1.40)
HCT: 48 % (ref 39.0–52.0)
Hemoglobin: 16.3 g/dL (ref 13.0–17.0)
O2 Saturation: 97 %
Potassium: 4.3 mmol/L (ref 3.5–5.1)
Sodium: 134 mmol/L — ABNORMAL LOW (ref 135–145)
TCO2: 28 mmol/L (ref 22–32)
pCO2 arterial: 51.2 mmHg — ABNORMAL HIGH (ref 32.0–48.0)
pH, Arterial: 7.329 — ABNORMAL LOW (ref 7.350–7.450)
pO2, Arterial: 103 mmHg (ref 83.0–108.0)

## 2020-10-01 LAB — POCT ACTIVATED CLOTTING TIME: Activated Clotting Time: 126 seconds

## 2020-10-25 ENCOUNTER — Other Ambulatory Visit (HOSPITAL_COMMUNITY): Payer: Self-pay

## 2020-10-25 ENCOUNTER — Ambulatory Visit: Payer: Self-pay | Admitting: Physician Assistant

## 2020-10-25 ENCOUNTER — Telehealth (HOSPITAL_COMMUNITY): Payer: Self-pay | Admitting: Physician Assistant

## 2020-10-25 NOTE — Telephone Encounter (Signed)
Wife called and cancelled echocardiogram for reason below:  10/24/2020 4:33 PM  By:   Imagene Gurney   Cancel Rsn: Patient (per wife, has noone to help her with Mr. Hoback. Has to call her daughter to see when she is off work.)     Order will be removed from the WQ and when they call back we will reinstate the order . Thank you

## 2020-10-25 NOTE — Telephone Encounter (Signed)
Order is not removed from the patient just from the WQ.

## 2020-10-25 NOTE — Telephone Encounter (Signed)
Do not remove that order, we will r/s . If you have removed, please replace.

## 2020-10-26 ENCOUNTER — Other Ambulatory Visit: Payer: Self-pay | Admitting: Physician Assistant

## 2020-10-26 DIAGNOSIS — Z952 Presence of prosthetic heart valve: Secondary | ICD-10-CM

## 2020-11-01 ENCOUNTER — Ambulatory Visit: Payer: Self-pay | Admitting: Physician Assistant

## 2020-11-14 ENCOUNTER — Other Ambulatory Visit: Payer: Self-pay

## 2020-11-14 ENCOUNTER — Ambulatory Visit (HOSPITAL_COMMUNITY)
Admission: RE | Admit: 2020-11-14 | Discharge: 2020-11-14 | Disposition: A | Discharge status: Home / Self Care | Payer: Self-pay | Source: Ambulatory Visit | Attending: Cardiology | Admitting: Cardiology

## 2020-11-14 DIAGNOSIS — Z952 Presence of prosthetic heart valve: Secondary | ICD-10-CM | POA: Insufficient documentation

## 2020-11-14 LAB — ECHOCARDIOGRAM COMPLETE
AR max vel: 1.27 cm2
AV Area VTI: 1.4 cm2
AV Area mean vel: 1.32 cm2
AV Mean grad: 13.3 mmHg
AV Peak grad: 26.2 mmHg
Ao pk vel: 2.56 m/s
Area-P 1/2: 1.78 cm2
MV VTI: 1.26 cm2
S' Lateral: 3.1 cm

## 2020-11-14 NOTE — Progress Notes (Signed)
*  PRELIMINARY RESULTS* Echocardiogram 2D Echocardiogram has been performed.  Brett Williamson 11/14/2020, 4:07 PM

## 2020-11-15 ENCOUNTER — Other Ambulatory Visit: Payer: Self-pay

## 2020-11-15 ENCOUNTER — Other Ambulatory Visit (HOSPITAL_COMMUNITY): Payer: Self-pay

## 2020-11-15 ENCOUNTER — Telehealth (INDEPENDENT_AMBULATORY_CARE_PROVIDER_SITE_OTHER): Payer: Self-pay | Admitting: Physician Assistant

## 2020-11-15 VITALS — BP 150/69 | Ht 71.0 in

## 2020-11-15 DIAGNOSIS — K869 Disease of pancreas, unspecified: Secondary | ICD-10-CM

## 2020-11-15 DIAGNOSIS — Z952 Presence of prosthetic heart valve: Secondary | ICD-10-CM

## 2020-11-15 DIAGNOSIS — C649 Malignant neoplasm of unspecified kidney, except renal pelvis: Secondary | ICD-10-CM

## 2020-11-15 DIAGNOSIS — R932 Abnormal findings on diagnostic imaging of liver and biliary tract: Secondary | ICD-10-CM

## 2020-11-15 MED ORDER — ASPIRIN EC 81 MG PO TBEC: 81.0000 mg | DELAYED_RELEASE_TABLET | Freq: Every day | ORAL | 3 refills | Status: DC

## 2020-11-15 NOTE — Progress Notes (Addendum)
[facility]   MULTIDISCIPLINARY HEART VALVE TEAM  Virtual Visit via Telephone Note   This visit type was conducted due to national recommendations for restrictions regarding the COVID-19 Pandemic (e.g. social distancing) in an effort to limit this patient's exposure and mitigate transmission in our community.  Due to his co-morbid illnesses, this patient is at least at moderate risk for complications without adequate follow up.  This format is felt to be most appropriate for this patient at this time.  The patient did not have access to video technology/had technical difficulties with video requiring transitioning to audio format only (telephone).  All issues noted in this document were discussed and addressed.  No physical exam could be performed with this format.  Please refer to the patient's chart for his  consent to telehealth for [facility].   Evaluation Performed:  Follow-up visit  Date:  11/15/2020   ID:  Brett Williamson, DOB 31-Dec-1940, MRN [id number]  Patient Location: Home Provider Location: Office/Clinic  PCP:  Neale Burly, MD  Primary Cardiologist: Rozann Lesches, MD  Primary Electrophysiologist:  None  Advanced Heart Failure: Glori Bickers, MD Structural Heart: Sherren Mocha, MD  Chief Complaint:  1 month s/p TAVR  History of Present Illness:    Brett Williamson is a 80 y.o. male with a history of chronic diastolic CHF, severe AS (by echo in 06/2020), subdural hematoma in 06/2020 secondary to mechanical fall (s/p craniotomy hematoma evacuation), HTN and Type 2 DMand recently diagnosed LV dysfunction, RV dysfunction and critical AS with prolonged admission. He underwent TAVR on 09/25/20. Today he presents via virtual medicine for follow up.   He was admitted from 2/1-2/19/22 for acute CHF with biventricular failure and AKI requiring diuresis by the advanced CHF team on ionotropes. He was found to have critical AS and undewent successful TAVR with a 29 mm  Edwards Sapien 3 Ultra THV via the TF approach on 09/25/20. Post operative echo showed EF 40-45%, normally functioning TAVR with a mean gradient 11.5mm hg and no PVL. Started on DAPT with Asprin and Plavix. Patient had a lot of issues wit mobility and was discharged home to [facility] course also complicated by possible cellulitis and bilateral groin candidal infections. Echo at [facility] on 11/14/20 echo showd EF 65%, normal RV function andnormally functioning TAVR with a mean gradient of 13.3 mm hg and no PVL.   Due to mobility issues and transportation issues he was scheduled for a virtual visit. Has not been taking any medications but tylenol and tramadol. No CP or SOB. No LE edema, orthopnea or PND. No dizziness or syncope. No blood in stool or urine. No palpitations. Mostly limited by knee pain.    Past Medical History:  Diagnosis Date  . Chronic combined systolic and diastolic CHF (congestive heart failure) ([place])   . Chronic kidney disease (CKD), stage III (moderate) ([facility])   . Diabetes ([place])   . Frequent falls   . HTN (hypertension)   . Morbid obesity ([place])   . RBBB   . Renal cell carcinoma ([place])    dx'd during TAVR work up  . 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 Burt Knack and Dr. Roxy Manns  . Severe aortic stenosis   . Subdural hematoma ([place]) 06/2020   after a mechanical fall   Past Surgical History:  Procedure Laterality Date  . CRANIOTOMY Right 06/19/2020   Procedure: CRANIOTOMY HEMATOMA  EVACUATION SUBDURAL;  Surgeon: Erline Levine, MD;  Location: [facility];  Service: Neurosurgery;  Laterality: Right;  . RIGHT/LEFT HEART CATH AND CORONARY ANGIOGRAPHY N/A 09/18/2020   Procedure: RIGHT/LEFT HEART CATH AND CORONARY ANGIOGRAPHY;  Surgeon: Jolaine Artist, MD;  Location: [place] [facility];  Service: Cardiovascular;  Laterality: N/A;  . ROTATOR CUFF REPAIR    . TEE WITHOUT CARDIOVERSION N/A 09/25/2020    Procedure: TRANSESOPHAGEAL ECHOCARDIOGRAM (TEE);  Surgeon: Sherren Mocha, MD;  Location: [place] [facility];  Service: Open Heart Surgery;  Laterality: N/A;  . TRANSCATHETER AORTIC VALVE REPLACEMENT, TRANSFEMORAL N/A 09/25/2020   Procedure: TRANSCATHETER AORTIC VALVE REPLACEMENT, TRANSFEMORAL;  Surgeon: Sherren Mocha, MD;  Location: [place] [facility];  Service: Open Heart Surgery;  Laterality: N/A;     Current Meds  Medication Sig  . acetaminophen (TYLENOL) 325 MG tablet Take 2 tablets (650 mg total) by mouth every 6 (six) hours as needed.  Marland Kitchen aspirin EC 81 MG tablet Take 1 tablet (81 mg total) by mouth daily. Swallow whole.  . traMADol (ULTRAM) 50 MG tablet Take 1 tablet (50 mg total) by mouth 2 (two) times daily as needed.     Allergies:   Patient has no known allergies.   Social History   Tobacco Use  . Smoking status: Never Smoker  . Smokeless tobacco: Never Used  Vaping Use  . Vaping Use: Never used  Substance Use Topics  . Alcohol use: Never  . Drug use: Never     Family Hx: The patient's family history includes Heart attack in his father.  ROS:   Please see the history of present illness.    All other systems reviewed and are negative.   Prior CV studies:   The following studies were reviewed today:  ECHO: 09/12/2020 1. Left ventricular ejection fraction, by estimation, is 35 to 40%. The  left ventricle has moderately decreased function. The left ventricle  demonstrates regional wall motion abnormalities (see scoring  diagram/findings for description). There is mild  left ventricular hypertrophy. Left ventricular diastolic parameters are  consistent with Grade II diastolic dysfunction (pseudonormalization).  2. Right ventricular systolic function is severely reduced. The right  ventricular size is normal. Tricuspid regurgitation signal is inadequate  for assessing PA pressure.  3. The mitral valve is abnormal. Mild mitral valve regurgitation.  Moderate  mitral annular calcification.  4. The aortic valve has an indeterminant number of cusps. There is severe  calcifcation of the aortic valve. Aortic valve regurgitation is not  visualized. Severe-critical aortic valve stenosis. Aortic valve mean  gradient measures 65.7 mmHg. Aortic valve  Vmax measures 4.89 m/s.  5. The inferior vena cava is normal in size with <50% respiratory  variability, suggesting right atrial pressure of 8 mmHg.   __________________    TAVR OPERATIVE NOTE   Date of Procedure:                09/25/2020  Preoperative Diagnosis:      Severe Aortic Stenosis   Postoperative Diagnosis:    Same   Procedure:        Transcatheter Aortic Valve Replacement - Percutaneous Transfemoral Approach             Edwards Sapien 3 THV (size 29 mm, model # 9600TFX, serial # [id number])              Co-Surgeons:  Valentina Gu. Roxy Manns, MD and Sherren Mocha, MD  Anesthesiologist:                  Albertha Ghee, MD  Echocardiographer:              Sanda Klein, MD  Pre-operative Echo Findings: ? Critical aortic stenosis ? Moderate left ventricular systolic dysfunction  Post-operative Echo Findings: ? trace paravalvular leak ? unchanged left ventricular systolic function   __________________  ECHO: Post TAVR 09/26/2020 1. Left ventricular ejection fraction, by estimation, is 40 to 45%. The  left ventricle has mildly decreased function. The left ventricle  demonstrates regional wall motion abnormalities (see scoring  diagram/findings for description). There is mild  concentric left ventricular hypertrophy. Left ventricular diastolic  parameters are consistent with Grade II diastolic dysfunction  (pseudonormalization). Elevated left atrial pressure. There is moderate  hypokinesis of the left ventricular, entire lateral  wall. There is moderate hypokinesis of the left ventricular, mid-apical  anterior wall.  2. Right ventricular systolic  function is normal. The right ventricular  size is normal.  3. Left atrial size was severely dilated.  4. The mitral valve is degenerative. Mild mitral valve regurgitation.  Mild mitral stenosis. The mean mitral valve gradient is 3.6 mmHg with  average heart rate of 73 bpm. Moderate mitral annular calcification.  5. The aortic valve has been repaired/replaced. Aortic valve  regurgitation is not visualized. There is a 29 mm Edwards Sapien  prosthetic (TAVR) valve present in the aortic position. Procedure Date:  09/25/2020. Echo findings are consistent with normal  structure and function of the aortic valve prosthesis. Aortic valve mean  gradient measures 11.5 mmHg. Aortic valve Vmax measures 2.33 m/s.  Acceleration time 67 ms.    _____________  Echo 11/14/20 IMPRESSIONS  1. Left ventricular ejection fraction, by estimation, is 65 to 70%. The  left ventricle has normal function. The left ventricle has no regional  wall motion abnormalities. There is moderate left ventricular hypertrophy.  Left ventricular diastolic  parameters are consistent with Grade I diastolic dysfunction (impaired  relaxation). Elevated left atrial pressure.  2. Right ventricular systolic function is normal. The right ventricular  size is normal.  3. Left atrial size was mildly dilated.  4. The mitral valve is normal in structure. Trivial mitral valve  regurgitation. No evidence of mitral stenosis. Moderate mitral annular  calcification.  5. Edwards Sapien 3 THV size 29 mm valve is in the AV position. . The  aortic valve has been repaired/replaced. Aortic valve regurgitation is not  visualized. No aortic stenosis is present.    Labs/Other Tests and Data Reviewed:    EKG:  No ECG reviewed.  Recent Labs: 09/11/2020: TSH 1.740 09/12/2020: B Natriuretic Peptide 1,561.0 09/22/2020: ALT 33 09/26/2020: Magnesium 1.9 09/29/2020: BUN 37; Creatinine, Ser 1.41; Hemoglobin 13.7; Platelets 195; Potassium 4.2;  Sodium 135   Recent Lipid Panel Lab Results  Component Value Date/Time   CHOL 116 09/14/2020 01:27 AM   TRIG 80 09/14/2020 01:27 AM   HDL 26 (L) 09/14/2020 01:27 AM   CHOLHDL 4.5 09/14/2020 01:27 AM   LDLCALC 74 09/14/2020 01:27 AM    Wt Readings from Last 3 Encounters:  09/29/20 240 lb 8.4 oz (109.1 kg)  07/03/20 255 lb 15.3 oz (116.1 kg)  06/20/20 266 lb 1.5 oz (120.7 kg)     Objective:    Vital Signs:  BP (!) 150/69   Ht 5\' 11"  (1.803 m)   BMI 33.55 kg/m  ASSESSMENT & PLAN:    Severe AS s/p TAVR: echo at [facility] on 11/14/20 echo showed EF 65%, normal RV function andnormally functioning TAVR with a mean gradient of 13.3 mm hg and no PVL. He has NYHA class II symptoms, but mostly limited by knee pain. SBE prophylaxis discussed; the patient is edentulous and does not go to the dentist. He has completely stopped all antiplatelets. I have asked him to resume Aspirin 81 mg daily which he reluctantly said he would do. I have arranged for an apt, in person, with Dr. Domenic Polite at the end of this month.   Chronic combined S/D CHF: most recent echo showed complete normalization of EF and RV. Sounds euvolemic and off all diuretics.   Renal cell carcinoma: this was picked up as an incidental finding on pre TAVR CT and confirmed on follow up MRI. Will arrange for outpatient urology evaluation   Pancreatic lesion: incidental finding on MRI. This could be a small side branch IPMN or postinflammatory cystic lesion. Pancreatic protocol MRI with and without contrast is recommended in 2 years time. If the patient has interval cross-sectional imaging related to follow up from the renal cell carcinoma, then that might be a reasonable substitute for MR imaging. This will be deferred to PCP.   Possible cirrhosis: pre TAVR Ct showed suggestion of a finely irregular liver surface, cannot exclude cirrhosis. Consider hepatic elastography for further liver fibrosis risk stratification, as clinically  warranted. I will defer this to his PCP  COVID-19 Education: The signs and symptoms of COVID-19 were discussed with the patient and how to seek care for testing (follow up with PCP or arrange E-visit).  The importance of social distancing was discussed today.  Time:   Today, I have spent 25 minutes with the patient with telehealth technology discussing the above problems.     Medication Adjustments/Labs and Tests Ordered: Current medicines are reviewed at length with the patient today.  Concerns regarding medicines are outlined above.   Tests Ordered: Orders Placed This Encounter  Procedures  . Ambulatory referral to Urology    Medication Changes: Meds ordered this encounter  Medications  . aspirin EC 81 MG tablet    Sig: Take 1 tablet (81 mg total) by mouth daily. Swallow whole.    Dispense:  90 tablet    Refill:  3     Disposition:  Follow up in 1 year(s)  Signed, Angelena Form, PA-C  11/15/2020 4:01 PM    [facility]

## 2020-11-15 NOTE — Patient Instructions (Addendum)
Medication Instructions:  1) RESTART ASPIRIN 81 mg daily *If you need a refill on your cardiac medications before your next appointment, please call your pharmacy*  Follow-Up: You have been referred to UROLOGY for renal cell carcinoma.   Please follow-up with Dr. Sherrie Sport for pancreatic lesions and liver abnormalities.   You have been scheduled with Dr. Domenic Polite 12/05/20 at 8:20AM to reestablish with Cardiology. This is located at the [facility] office.  You will be called to arrange your 1 year TAVR visits.

## 2020-11-16 ENCOUNTER — Emergency Department (HOSPITAL_COMMUNITY): Payer: Medicare Other

## 2020-11-16 ENCOUNTER — Other Ambulatory Visit: Payer: Self-pay

## 2020-11-16 ENCOUNTER — Inpatient Hospital Stay (HOSPITAL_COMMUNITY)
Admission: EM | Admit: 2020-11-16 | Discharge: 2020-11-26 | DRG: 871 | Disposition: A | Discharge status: Home Health | Payer: Medicare Other | Attending: Internal Medicine | Admitting: Internal Medicine

## 2020-11-16 DIAGNOSIS — E1151 Type 2 diabetes mellitus with diabetic peripheral angiopathy without gangrene: Secondary | ICD-10-CM | POA: Diagnosis present

## 2020-11-16 DIAGNOSIS — E1152 Type 2 diabetes mellitus with diabetic peripheral angiopathy with gangrene: Secondary | ICD-10-CM | POA: Diagnosis present

## 2020-11-16 DIAGNOSIS — E871 Hypo-osmolality and hyponatremia: Secondary | ICD-10-CM | POA: Diagnosis present

## 2020-11-16 DIAGNOSIS — N1832 Chronic kidney disease, stage 3b: Secondary | ICD-10-CM | POA: Diagnosis present

## 2020-11-16 DIAGNOSIS — Z952 Presence of prosthetic heart valve: Secondary | ICD-10-CM | POA: Diagnosis not present

## 2020-11-16 DIAGNOSIS — E1165 Type 2 diabetes mellitus with hyperglycemia: Secondary | ICD-10-CM | POA: Diagnosis present

## 2020-11-16 DIAGNOSIS — N401 Enlarged prostate with lower urinary tract symptoms: Secondary | ICD-10-CM | POA: Diagnosis present

## 2020-11-16 DIAGNOSIS — G9340 Encephalopathy, unspecified: Secondary | ICD-10-CM

## 2020-11-16 DIAGNOSIS — A4101 Sepsis due to Methicillin susceptible Staphylococcus aureus: Secondary | ICD-10-CM | POA: Diagnosis present

## 2020-11-16 DIAGNOSIS — L03115 Cellulitis of right lower limb: Secondary | ICD-10-CM | POA: Diagnosis present

## 2020-11-16 DIAGNOSIS — C642 Malignant neoplasm of left kidney, except renal pelvis: Secondary | ICD-10-CM | POA: Diagnosis present

## 2020-11-16 DIAGNOSIS — E1122 Type 2 diabetes mellitus with diabetic chronic kidney disease: Secondary | ICD-10-CM | POA: Diagnosis present

## 2020-11-16 DIAGNOSIS — N390 Urinary tract infection, site not specified: Secondary | ICD-10-CM

## 2020-11-16 DIAGNOSIS — S065X9S Traumatic subdural hemorrhage with loss of consciousness of unspecified duration, sequela: Secondary | ICD-10-CM

## 2020-11-16 DIAGNOSIS — M79604 Pain in right leg: Secondary | ICD-10-CM

## 2020-11-16 DIAGNOSIS — E872 Acidosis: Secondary | ICD-10-CM | POA: Diagnosis present

## 2020-11-16 DIAGNOSIS — I130 Hypertensive heart and chronic kidney disease with heart failure and stage 1 through stage 4 chronic kidney disease, or unspecified chronic kidney disease: Secondary | ICD-10-CM | POA: Diagnosis present

## 2020-11-16 DIAGNOSIS — R000 Tachycardia, unspecified: Secondary | ICD-10-CM | POA: Diagnosis present

## 2020-11-16 DIAGNOSIS — I350 Nonrheumatic aortic (valve) stenosis: Secondary | ICD-10-CM | POA: Diagnosis present

## 2020-11-16 DIAGNOSIS — L89626 Pressure-induced deep tissue damage of left heel: Secondary | ICD-10-CM | POA: Diagnosis present

## 2020-11-16 DIAGNOSIS — Z7982 Long term (current) use of aspirin: Secondary | ICD-10-CM

## 2020-11-16 DIAGNOSIS — Z6834 Body mass index (BMI) 34.0-34.9, adult: Secondary | ICD-10-CM

## 2020-11-16 DIAGNOSIS — Z8249 Family history of ischemic heart disease and other diseases of the circulatory system: Secondary | ICD-10-CM

## 2020-11-16 DIAGNOSIS — N3000 Acute cystitis without hematuria: Secondary | ICD-10-CM

## 2020-11-16 DIAGNOSIS — M79606 Pain in leg, unspecified: Secondary | ICD-10-CM

## 2020-11-16 DIAGNOSIS — L03116 Cellulitis of left lower limb: Secondary | ICD-10-CM | POA: Diagnosis present

## 2020-11-16 DIAGNOSIS — Z20822 Contact with and (suspected) exposure to covid-19: Secondary | ICD-10-CM | POA: Diagnosis present

## 2020-11-16 DIAGNOSIS — E669 Obesity, unspecified: Secondary | ICD-10-CM | POA: Diagnosis present

## 2020-11-16 DIAGNOSIS — R6521 Severe sepsis with septic shock: Secondary | ICD-10-CM | POA: Diagnosis present

## 2020-11-16 DIAGNOSIS — N136 Pyonephrosis: Secondary | ICD-10-CM | POA: Diagnosis present

## 2020-11-16 DIAGNOSIS — Z66 Do not resuscitate: Secondary | ICD-10-CM | POA: Diagnosis not present

## 2020-11-16 DIAGNOSIS — I739 Peripheral vascular disease, unspecified: Secondary | ICD-10-CM

## 2020-11-16 DIAGNOSIS — R296 Repeated falls: Secondary | ICD-10-CM | POA: Diagnosis present

## 2020-11-16 DIAGNOSIS — K5900 Constipation, unspecified: Secondary | ICD-10-CM | POA: Diagnosis not present

## 2020-11-16 DIAGNOSIS — B9689 Other specified bacterial agents as the cause of diseases classified elsewhere: Secondary | ICD-10-CM | POA: Diagnosis present

## 2020-11-16 DIAGNOSIS — I10 Essential (primary) hypertension: Secondary | ICD-10-CM | POA: Diagnosis present

## 2020-11-16 DIAGNOSIS — G9341 Metabolic encephalopathy: Secondary | ICD-10-CM | POA: Diagnosis present

## 2020-11-16 DIAGNOSIS — A419 Sepsis, unspecified organism: Secondary | ICD-10-CM | POA: Diagnosis present

## 2020-11-16 DIAGNOSIS — E876 Hypokalemia: Secondary | ICD-10-CM | POA: Diagnosis present

## 2020-11-16 DIAGNOSIS — N2889 Other specified disorders of kidney and ureter: Secondary | ICD-10-CM

## 2020-11-16 DIAGNOSIS — I5042 Chronic combined systolic (congestive) and diastolic (congestive) heart failure: Secondary | ICD-10-CM | POA: Diagnosis present

## 2020-11-16 DIAGNOSIS — M79605 Pain in left leg: Secondary | ICD-10-CM

## 2020-11-16 DIAGNOSIS — E11621 Type 2 diabetes mellitus with foot ulcer: Secondary | ICD-10-CM | POA: Diagnosis present

## 2020-11-16 DIAGNOSIS — R338 Other retention of urine: Secondary | ICD-10-CM | POA: Diagnosis present

## 2020-11-16 DIAGNOSIS — R6520 Severe sepsis without septic shock: Secondary | ICD-10-CM

## 2020-11-16 DIAGNOSIS — L89616 Pressure-induced deep tissue damage of right heel: Secondary | ICD-10-CM | POA: Diagnosis present

## 2020-11-16 DIAGNOSIS — M549 Dorsalgia, unspecified: Secondary | ICD-10-CM

## 2020-11-16 DIAGNOSIS — R0600 Dyspnea, unspecified: Secondary | ICD-10-CM

## 2020-11-16 DIAGNOSIS — R0689 Other abnormalities of breathing: Secondary | ICD-10-CM

## 2020-11-16 DIAGNOSIS — N183 Chronic kidney disease, stage 3 unspecified: Secondary | ICD-10-CM | POA: Diagnosis present

## 2020-11-16 LAB — RESP PANEL BY RT-PCR (FLU A&B, COVID) ARPGX2
Influenza A by PCR: NEGATIVE
Influenza B by PCR: NEGATIVE
SARS Coronavirus 2 by RT PCR: NEGATIVE

## 2020-11-16 LAB — CBC WITH DIFFERENTIAL/PLATELET
Abs Immature Granulocytes: 0.14 10*3/uL — ABNORMAL HIGH (ref 0.00–0.07)
Basophils Absolute: 0 10*3/uL (ref 0.0–0.1)
Basophils Relative: 0 %
Eosinophils Absolute: 0 10*3/uL (ref 0.0–0.5)
Eosinophils Relative: 0 %
HCT: 44.6 % (ref 39.0–52.0)
Hemoglobin: 14.2 g/dL (ref 13.0–17.0)
Immature Granulocytes: 1 %
Lymphocytes Relative: 2 %
Lymphs Abs: 0.3 10*3/uL — ABNORMAL LOW (ref 0.7–4.0)
MCH: 25.8 pg — ABNORMAL LOW (ref 26.0–34.0)
MCHC: 31.8 g/dL (ref 30.0–36.0)
MCV: 81.1 fL (ref 80.0–100.0)
Monocytes Absolute: 1.3 10*3/uL — ABNORMAL HIGH (ref 0.1–1.0)
Monocytes Relative: 9 %
Neutro Abs: 12.3 10*3/uL — ABNORMAL HIGH (ref 1.7–7.7)
Neutrophils Relative %: 88 %
Platelets: 123 10*3/uL — ABNORMAL LOW (ref 150–400)
RBC: 5.5 MIL/uL (ref 4.22–5.81)
RDW: 18.4 % — ABNORMAL HIGH (ref 11.5–15.5)
WBC: 14 10*3/uL — ABNORMAL HIGH (ref 4.0–10.5)
nRBC: 0 % (ref 0.0–0.2)

## 2020-11-16 LAB — PROTIME-INR
INR: 1.2 (ref 0.8–1.2)
Prothrombin Time: 14.6 seconds (ref 11.4–15.2)

## 2020-11-16 LAB — URINALYSIS, ROUTINE W REFLEX MICROSCOPIC
Bilirubin Urine: NEGATIVE
Glucose, UA: >=500 mg/dL — AB
Ketones, ur: 5 mg/dL — AB
Nitrite: POSITIVE — AB
Protein, ur: 100 mg/dL — AB
Specific Gravity, Urine: 1.012 (ref 1.005–1.030)
WBC, UA: >50 WBC/hpf — ABNORMAL HIGH (ref 0–5)
pH: 5 (ref 5.0–8.0)

## 2020-11-16 LAB — COMPREHENSIVE METABOLIC PANEL
ALT: 18 U/L (ref 0–44)
AST: 24 U/L (ref 15–41)
Albumin: 3.4 g/dL — ABNORMAL LOW (ref 3.5–5.0)
Alkaline Phosphatase: 65 U/L (ref 38–126)
Anion gap: 12 (ref 5–15)
BUN: 36 mg/dL — ABNORMAL HIGH (ref 8–23)
CO2: 19 mmol/L — ABNORMAL LOW (ref 22–32)
Calcium: 8.9 mg/dL (ref 8.9–10.3)
Chloride: 98 mmol/L (ref 98–111)
Creatinine, Ser: 1.44 mg/dL — ABNORMAL HIGH (ref 0.61–1.24)
GFR, Estimated: 49 mL/min — ABNORMAL LOW (ref >60)
Glucose, Bld: 252 mg/dL — ABNORMAL HIGH (ref 70–99)
Potassium: 4.1 mmol/L (ref 3.5–5.1)
Sodium: 129 mmol/L — ABNORMAL LOW (ref 135–145)
Total Bilirubin: 1.3 mg/dL — ABNORMAL HIGH (ref 0.3–1.2)
Total Protein: 7.7 g/dL (ref 6.5–8.1)

## 2020-11-16 LAB — BASIC METABOLIC PANEL
Anion gap: 9 (ref 5–15)
BUN: 36 mg/dL — ABNORMAL HIGH (ref 8–23)
CO2: 19 mmol/L — ABNORMAL LOW (ref 22–32)
Calcium: 8.2 mg/dL — ABNORMAL LOW (ref 8.9–10.3)
Chloride: 102 mmol/L (ref 98–111)
Creatinine, Ser: 1.58 mg/dL — ABNORMAL HIGH (ref 0.61–1.24)
GFR, Estimated: 44 mL/min — ABNORMAL LOW (ref >60)
Glucose, Bld: 189 mg/dL — ABNORMAL HIGH (ref 70–99)
Potassium: 3.9 mmol/L (ref 3.5–5.1)
Sodium: 130 mmol/L — ABNORMAL LOW (ref 135–145)

## 2020-11-16 LAB — LACTIC ACID, PLASMA
Lactic Acid, Venous: 1.6 mmol/L (ref 0.5–1.9)
Lactic Acid, Venous: 2.9 mmol/L (ref 0.5–1.9)
Lactic Acid, Venous: 1.5 mmol/L (ref 0.5–1.9)
Lactic Acid, Venous: 1.6 mmol/L (ref 0.5–1.9)

## 2020-11-16 LAB — APTT: aPTT: 36 seconds (ref 24–36)

## 2020-11-16 MED ORDER — ACETAMINOPHEN 500 MG PO TABS
1000.0000 mg | ORAL_TABLET | Freq: Once | ORAL | Status: AC
  Administered 2020-11-16: 1000 mg via ORAL
  Filled 2020-11-16: qty 2

## 2020-11-16 MED ORDER — LACTATED RINGERS IV SOLN: INTRAVENOUS | Status: AC

## 2020-11-16 MED ORDER — OXYCODONE-ACETAMINOPHEN 5-325 MG PO TABS
1.0000 | ORAL_TABLET | Freq: Four times a day (QID) | ORAL | Status: DC | PRN
  Administered 2020-11-16 – 2020-11-20 (×11): 1 via ORAL
  Filled 2020-11-16 (×11): qty 1

## 2020-11-16 MED ORDER — VANCOMYCIN HCL IN DEXTROSE 1-5 GM/200ML-% IV SOLN: 1000.0000 mg | Freq: Once | INTRAVENOUS | Status: DC

## 2020-11-16 MED ORDER — ONDANSETRON HCL 4 MG/2ML IJ SOLN: 4.0000 mg | Freq: Once | INTRAMUSCULAR | Status: AC

## 2020-11-16 MED ORDER — LACTATED RINGERS IV BOLUS (SEPSIS)
1000.0000 mL | Freq: Once | INTRAVENOUS | Status: AC
  Administered 2020-11-16: 1000 mL via INTRAVENOUS

## 2020-11-16 MED ORDER — IOHEXOL 300 MG/ML  SOLN
100.0000 mL | Freq: Once | INTRAMUSCULAR | Status: AC | PRN
  Administered 2020-11-16: 100 mL via INTRAVENOUS

## 2020-11-16 MED ORDER — TAMSULOSIN HCL 0.4 MG PO CAPS
0.4000 mg | ORAL_CAPSULE | Freq: Every day | ORAL | Status: DC
  Administered 2020-11-17 – 2020-11-25 (×9): 0.4 mg via ORAL
  Filled 2020-11-16 (×10): qty 1

## 2020-11-16 MED ORDER — SODIUM CHLORIDE 0.9 % IV SOLN
2.0000 g | Freq: Once | INTRAVENOUS | Status: AC
  Administered 2020-11-16: 2 g via INTRAVENOUS
  Filled 2020-11-16: qty 2

## 2020-11-16 MED ORDER — ENOXAPARIN SODIUM 40 MG/0.4ML ~~LOC~~ SOLN
40.0000 mg | SUBCUTANEOUS | Status: DC
  Administered 2020-11-17 – 2020-11-19 (×3): 40 mg via SUBCUTANEOUS
  Filled 2020-11-16 (×4): qty 0.4

## 2020-11-16 MED ORDER — INSULIN ASPART 100 UNIT/ML ~~LOC~~ SOLN
0.0000 [IU] | Freq: Four times a day (QID) | SUBCUTANEOUS | Status: DC
  Administered 2020-11-17 (×2): 3 [IU] via SUBCUTANEOUS
  Administered 2020-11-18: 2 [IU] via SUBCUTANEOUS

## 2020-11-16 MED ORDER — ONDANSETRON HCL 4 MG/2ML IJ SOLN
INTRAMUSCULAR | Status: AC
  Administered 2020-11-16: 4 mg via INTRAVENOUS
  Filled 2020-11-16: qty 2

## 2020-11-16 MED ORDER — SODIUM CHLORIDE 0.9 % IV SOLN
2.0000 g | Freq: Two times a day (BID) | INTRAVENOUS | Status: DC
  Filled 2020-11-16: qty 2

## 2020-11-16 MED ORDER — LACTATED RINGERS IV BOLUS (SEPSIS)
500.0000 mL | Freq: Once | INTRAVENOUS | Status: AC
  Administered 2020-11-16: 500 mL via INTRAVENOUS

## 2020-11-16 MED ORDER — ONDANSETRON HCL 4 MG/2ML IJ SOLN: 4.0000 mg | Freq: Four times a day (QID) | INTRAMUSCULAR | Status: DC | PRN

## 2020-11-16 MED ORDER — SODIUM CHLORIDE 0.9 % IV SOLN
2.0000 g | INTRAVENOUS | Status: DC
  Administered 2020-11-17: 2 g via INTRAVENOUS
  Filled 2020-11-16: qty 20

## 2020-11-16 MED ORDER — SODIUM CHLORIDE 0.9 % IV SOLN: 2.0000 g | INTRAVENOUS | Status: DC

## 2020-11-16 MED ORDER — ONDANSETRON HCL 4 MG PO TABS
4.0000 mg | ORAL_TABLET | Freq: Four times a day (QID) | ORAL | Status: DC | PRN
  Administered 2020-11-19 – 2020-11-23 (×2): 4 mg via ORAL
  Filled 2020-11-16 (×2): qty 1

## 2020-11-16 MED ORDER — HYDROMORPHONE HCL 1 MG/ML IJ SOLN
0.5000 mg | Freq: Once | INTRAMUSCULAR | Status: AC
  Administered 2020-11-16: 0.5 mg via INTRAVENOUS
  Filled 2020-11-16: qty 1

## 2020-11-16 MED ORDER — VANCOMYCIN HCL 1250 MG/250ML IV SOLN: 1250.0000 mg | INTRAVENOUS | Status: DC

## 2020-11-16 MED ORDER — LACTATED RINGERS IV BOLUS
500.0000 mL | Freq: Once | INTRAVENOUS | Status: AC
  Administered 2020-11-16: 500 mL via INTRAVENOUS

## 2020-11-16 MED ORDER — METRONIDAZOLE IN NACL 5-0.79 MG/ML-% IV SOLN
500.0000 mg | Freq: Once | INTRAVENOUS | Status: AC
  Administered 2020-11-16: 500 mg via INTRAVENOUS
  Filled 2020-11-16: qty 100

## 2020-11-16 MED ORDER — VANCOMYCIN HCL 2000 MG/400ML IV SOLN
2000.0000 mg | Freq: Once | INTRAVENOUS | Status: AC
  Administered 2020-11-16: 2000 mg via INTRAVENOUS
  Filled 2020-11-16: qty 400

## 2020-11-16 NOTE — Progress Notes (Signed)
Elink monitoring for sepsis protocol 

## 2020-11-16 NOTE — ED Triage Notes (Signed)
Presents from home for complaints of high fever of 103 and wife reports altered mental status from baseline

## 2020-11-16 NOTE — H&P (Signed)
History and Physical  Brett Williamson UEA:[id number] DOB: [birth year] DOA: 11/16/2020  Referring physician: Dr Rogene Houston, ED physician PCP: Neale Burly, MD  Outpatient Specialists:   Patient Coming From: home  Chief Complaint: fever, confusion  HPI: Brett Williamson is a 80 y.o. 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 which was found incidentally during his last hospitalization approximately 6 weeks ago, history of severe aortic stenosis status post TAVR approximately 6 weeks ago.  Per the patient's daughter, the patient gets frequent UTIs.  Yesterday, the patient developed a fever with shaking chills nausea.  Patient's became more confused with continued fevers this morning.  Patient was brought to the hospital for evaluation.  Fever improved with some Tylenol.  No other palliating or provoking factors.  Denies chest pain, shortness of breath.  Emergency Department Course: CT shows the palpable left renal cell carcinoma.  There appears to be some inflammation surrounding the left kidney.  No other acute finding.  Fever of 103.2, tachycardic, fluid resuscitated.  Blood cultures, urine cultures pending.  Broad-spectrum antibiotic started  Review of Systems:   Pt complains of hesitancy, frequency, incomplete bladder emptying, nocturia x5 or 6.  Pt denies any diarrhea, constipation, abdominal pain, shortness of breath, dyspnea on exertion, orthopnea, cough, wheezing, palpitations, headache, vision changes, lightheadedness, dizziness, melena, rectal bleeding.  Review of systems are otherwise negative  Past Medical History:  Diagnosis Date  . Chronic combined systolic and diastolic CHF (congestive heart failure) ([place])   . Chronic kidney disease (CKD), stage III (moderate) (HCC)   . Diabetes ([place])   . Frequent falls   . HTN (hypertension)   . Morbid obesity ([place])   . RBBB    . Renal cell carcinoma ([place])    dx'd during TAVR work up  . 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 Burt Knack and Dr. Roxy Manns  . Severe aortic stenosis   . Subdural hematoma ([place]) 06/2020   after a mechanical fall   Past Surgical History:  Procedure Laterality Date  . CRANIOTOMY Right 06/19/2020   Procedure: CRANIOTOMY HEMATOMA EVACUATION SUBDURAL;  Surgeon: Erline Levine, MD;  Location: [facility];  Service: Neurosurgery;  Laterality: Right;  . RIGHT/LEFT HEART CATH AND CORONARY ANGIOGRAPHY N/A 09/18/2020   Procedure: RIGHT/LEFT HEART CATH AND CORONARY ANGIOGRAPHY;  Surgeon: Jolaine Artist, MD;  Location: [facility];  Service: Cardiovascular;  Laterality: N/A;  . ROTATOR CUFF REPAIR    . TEE WITHOUT CARDIOVERSION N/A 09/25/2020   Procedure: TRANSESOPHAGEAL ECHOCARDIOGRAM (TEE);  Surgeon: Sherren Mocha, MD;  Location: [facility];  Service: Open Heart Surgery;  Laterality: N/A;  . TRANSCATHETER AORTIC VALVE REPLACEMENT, TRANSFEMORAL N/A 09/25/2020   Procedure: TRANSCATHETER AORTIC VALVE REPLACEMENT, TRANSFEMORAL;  Surgeon: Sherren Mocha, MD;  Location: [facility];  Service: Open Heart Surgery;  Laterality: N/A;   Social History:  reports that he has never smoked. He has never used smokeless tobacco. He reports that he does not drink alcohol and does not use drugs. Patient lives at home  No Known Allergies  Family History  Problem Relation Age of Onset  . Heart attack Father       Prior to Admission medications   Medication Sig Start Date End Date Taking? Authorizing Provider  acetaminophen (TYLENOL) 325 MG tablet Take 2 tablets (650 mg total)  by mouth every 6 (six) hours as needed. Patient taking differently: Take 650 mg by mouth every 6 (six) hours as needed for mild pain. 09/29/20  Yes Barrett, Evelene Croon, PA-C  traMADol (ULTRAM) 50 MG tablet Take 1 tablet (50 mg total) by mouth 2  (two) times daily as needed. Patient taking differently: Take 50 mg by mouth 2 (two) times daily as needed for moderate pain. 09/29/20  Yes Barrett, Evelene Croon, PA-C  aspirin EC 81 MG tablet Take 1 tablet (81 mg total) by mouth daily. Swallow whole. Patient not taking: Reported on 11/16/2020 11/15/20   Eileen Stanford, PA-C    Physical Exam: BP (!) 94/54   Pulse (!) 108   Temp 99 F (37.2 C) (Oral)   Resp (!) 22   Ht 5\' 11"  (1.803 m)   Wt 108.9 kg   SpO2 97%   BMI 33.47 kg/m   . General: Elderly male. Awake and alert and oriented x3. No acute cardiopulmonary distress.  Marland Kitchen HEENT: Normocephalic atraumatic.  Right and left ears normal in appearance.  Pupils equal, round, reactive to light. Extraocular muscles are intact. Sclerae anicteric and noninjected.  Moist mucosal membranes. No mucosal lesions.  . Neck: Neck supple without lymphadenopathy. No carotid bruits. No masses palpated.  . Cardiovascular: Regular rate with normal S1-S2 sounds. No murmurs, rubs, gallops auscultated. No JVD.  Marland Kitchen Respiratory: Good respiratory effort with no wheezes, rales, rhonchi. Lungs clear to auscultation bilaterally.  No accessory muscle use. . Abdomen: Soft, suprapubic tenderness, nondistended. Active bowel sounds. No masses or hepatosplenomegaly  . Skin: No rashes, lesions, or ulcerations.  Dry, warm to touch. 2+ dorsalis pedis and radial pulses. . Musculoskeletal: No calf or leg pain. All major joints not erythematous nontender.  No upper or lower joint deformation.  Good ROM.  No contractures  . Psychiatric: Intact judgment and insight. Pleasant and cooperative. . Neurologic: No focal neurological deficits. Strength is 5/5 and symmetric in upper and lower extremities.  Cranial nerves II through XII are grossly intact.           Labs on Admission: I have personally reviewed following labs and imaging studies  CBC: Recent Labs  Lab 11/16/20 1132  WBC 14.0*  NEUTROABS 12.3*  HGB 14.2  HCT 44.6  MCV  81.1  PLT 782*   Basic Metabolic Panel: Recent Labs  Lab 11/16/20 1132  NA 129*  K 4.1  CL 98  CO2 19*  GLUCOSE 252*  BUN 36*  CREATININE 1.44*  CALCIUM 8.9   GFR: Estimated Creatinine Clearance: 50.5 mL/Williamson (A) (by C-G formula based on SCr of 1.44 mg/dL (H)). Liver Function Tests: Recent Labs  Lab 11/16/20 1132  AST 24  ALT 18  ALKPHOS 65  BILITOT 1.3*  PROT 7.7  ALBUMIN 3.4*   No results for input(s): LIPASE, AMYLASE in the last 168 hours. No results for input(s): AMMONIA in the last 168 hours. Coagulation Profile: Recent Labs  Lab 11/16/20 1132  INR 1.2   Cardiac Enzymes: No results for input(s): CKTOTAL, CKMB, CKMBINDEX, TROPONINI in the last 168 hours. BNP (last 3 results) No results for input(s): PROBNP in the last 8760 hours. HbA1C: No results for input(s): HGBA1C in the last 72 hours. CBG: No results for input(s): GLUCAP in the last 168 hours. Lipid Profile: No results for input(s): CHOL, HDL, LDLCALC, TRIG, CHOLHDL, LDLDIRECT in the last 72 hours. Thyroid Function Tests: No results for input(s): TSH, T4TOTAL, FREET4, T3FREE, THYROIDAB in the last 72 hours. Anemia  Panel: No results for input(s): VITAMINB12, FOLATE, FERRITIN, TIBC, IRON, RETICCTPCT in the last 72 hours. Urine analysis:    Component Value Date/Time   COLORURINE YELLOW 11/16/2020 1235   APPEARANCEUR HAZY (A) 11/16/2020 1235   LABSPEC 1.012 11/16/2020 1235   PHURINE 5.0 11/16/2020 1235   GLUCOSEU >=500 (A) 11/16/2020 1235   HGBUR MODERATE (A) 11/16/2020 1235   BILIRUBINUR NEGATIVE 11/16/2020 1235   KETONESUR 5 (A) 11/16/2020 1235   PROTEINUR 100 (A) 11/16/2020 1235   NITRITE POSITIVE (A) 11/16/2020 1235   LEUKOCYTESUR LARGE (A) 11/16/2020 1235   Sepsis Labs: @LABRCNTIP (procalcitonin:4,lacticidven:4) ) Recent Results (from the past 240 hour(s))  Blood Culture (routine x 2)     Status: None (Preliminary result)   Collection Time: 11/16/20 11:34 AM   Specimen: BLOOD  Result  Value Ref Range Status   Specimen Description BLOOD LEFT ANTECUBITAL  Final   Special Requests   Final    Blood Culture adequate volume BOTTLES DRAWN AEROBIC AND ANAEROBIC Performed at [facility], [address] [place], [place] [address]    Culture PENDING  Incomplete   Report Status PENDING  Incomplete  Blood Culture (routine x 2)     Status: None (Preliminary result)   Collection Time: 11/16/20 11:41 AM   Specimen: BLOOD  Result Value Ref Range Status   Specimen Description BLOOD BLOOD RIGHT ARM  Final   Special Requests   Final    Blood Culture results may not be optimal due to an excessive volume of blood received in culture bottles BOTTLES DRAWN AEROBIC AND ANAEROBIC Performed at [facility], [address], [place], [place] [address]    Culture PENDING  Incomplete   Report Status PENDING  Incomplete  Resp Panel by RT-PCR (Flu A&B, Covid) Nasopharyngeal Swab     Status: None   Collection Time: 11/16/20 11:49 AM   Specimen: Nasopharyngeal Swab; Nasopharyngeal(NP) swabs in vial transport medium  Result Value Ref Range Status   SARS Coronavirus 2 by RT PCR NEGATIVE NEGATIVE Final    Comment: (NOTE) SARS-CoV-2 target nucleic acids are NOT DETECTED.  The SARS-CoV-2 RNA is generally detectable in upper respiratory specimens during the acute phase of infection. The lowest concentration of SARS-CoV-2 viral copies this assay can detect is 138 copies/mL. A negative result does not preclude SARS-Cov-2 infection and should not be used as the sole basis for treatment or other patient management decisions. A negative result may occur with  improper specimen collection/handling, submission of specimen other than nasopharyngeal swab, presence of viral mutation(s) within the areas targeted by this assay, and inadequate number of viral copies(<138 copies/mL). A negative result must be combined with clinical observations, patient history, and epidemiological information. The expected  result is Negative.  Fact Sheet for Patients:  EntrepreneurPulse.com.au  Fact Sheet for Healthcare Providers:  IncredibleEmployment.be  This test is no t yet approved or cleared by the [place] FDA and  has been authorized for detection and/or diagnosis of SARS-CoV-2 by FDA under an Emergency Use Authorization (EUA). This EUA will remain  in effect (meaning this test can be used) for the duration of the COVID-19 declaration under Section 564(b)(1) of the Act, 21 U.S.C.section 360bbb-3(b)(1), unless the authorization is terminated  or revoked sooner.       Influenza A by PCR NEGATIVE NEGATIVE Final   Influenza B by PCR NEGATIVE NEGATIVE Final    Comment: (NOTE) The Xpert Xpress SARS-CoV-2/FLU/RSV plus assay is intended as an aid in the diagnosis of influenza from Nasopharyngeal swab  specimens and should not be used as a sole basis for treatment. Nasal washings and aspirates are unacceptable for Xpert Xpress SARS-CoV-2/FLU/RSV testing.  Fact Sheet for Patients: EntrepreneurPulse.com.au  Fact Sheet for Healthcare Providers: IncredibleEmployment.be  This test is not yet approved or cleared by the [place] FDA and has been authorized for detection and/or diagnosis of SARS-CoV-2 by FDA under an Emergency Use Authorization (EUA). This EUA will remain in effect (meaning this test can be used) for the duration of the COVID-19 declaration under Section 564(b)(1) of the Act, 21 U.S.C. section 360bbb-3(b)(1), unless the authorization is terminated or revoked.  Performed at [facility], [address], [place], [place] [address]      Radiological Exams on Admission: CT Abdomen Pelvis W Contrast  Result Date: 11/16/2020 CLINICAL DATA:  Fever and abdominal pain. History of renal cell carcinoma. EXAM: CT ABDOMEN AND PELVIS WITH CONTRAST TECHNIQUE: Multidetector CT imaging of the abdomen and pelvis was  performed using the standard protocol following bolus administration of intravenous contrast. CONTRAST:  169mL OMNIPAQUE IOHEXOL 300 MG/ML  SOLN COMPARISON:  Abdominal MRI 09/22/2020.  Abdominal CT 09/20/2020 FINDINGS: Lower chest: Normal heart size post TAVR. Mild breathing motion artifact at the lung bases scattered atelectasis. No confluent consolidation or pleural effusion. Hepatobiliary: No focal liver abnormality is seen. Distended gallbladder with layering stones/sludge. No obvious pericholecystic fat stranding inflammation, there is motion artifact through this region. No biliary dilatation. Pancreas: Fatty atrophy. No ductal dilatation or inflammation. Cystic pancreatic tail lesion on MRI is not well appreciated by CT. Spleen: Enlarged spanning approximately 15 mm cranial caudal, no focal splenic abnormality. 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 Normal right adrenal gland. There is mild right hydroureteronephrosis, with the ureter dilated to the bladder insertion. No evidence of stone or obstructing lesion. There is minimal prominence of the left ureter and renal pelvis. There is right greater than left perinephric edema. Areas of left renal cortical scarring. Enhancing lesion arising from the lower left kidney measures 2 cm. Previous right renal cyst is grossly stable. Motion artifact obscures detailed assessment. Urinary bladder is physiologically distended, question of bladder wall thickening Stomach/Bowel: Stomach is within normal limits. Appendix is not confidently visualized, no evidence of appendicitis. No evidence of bowel wall thickening, distention, or inflammatory changes. Moderate colonic stool burden. Vascular/Lymphatic: Aortic atherosclerosis. No aortic aneurysm. Small retroperitoneal lymph nodes are not enlarged by size criteria. Reproductive: Prostatic calcifications. Other: No free air.  No intra-abdominal abscess.  No ascites.  Musculoskeletal: Diffuse degenerative disc disease and facet hypertrophy in the included spine. Moderate degenerative change of both hips. No acute osseous abnormality. IMPRESSION: 1. Mild right hydroureteronephrosis, with the ureter dilated to the bladder insertion. Mild prominence of the left ureter and renal pelvis. No evidence of stone or obstructing lesion. There is right greater than left perinephric edema. Findings are suspicious for urinary tract infection. 2. Enhancing 2 cm lesion arising from the lower left kidney, suspicious for renal cell carcinoma, unchanged in size from recent imaging. 3. Distended gallbladder with layering stones/sludge. No obvious pericholecystic fat stranding inflammation, there is motion artifact through this region. 4. Splenomegaly. 5. 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 6. Cystic pancreatic tail lesion on MRI is not well appreciated by CT. Aortic Atherosclerosis (ICD10-I70.0). Electronically Signed   By: Keith Rake M.D.   On: 11/16/2020 16:47   DG Chest Port 1 View  Result Date: 11/16/2020 CLINICAL DATA:  Fever with altered mental status EXAM: PORTABLE CHEST  1 VIEW COMPARISON:  September 24, 2020 FINDINGS: Lungs are clear. Heart size and pulmonary vascularity are normal. No adenopathy. Patient is status post aortic valve replacement. There is degenerative change in each shoulder. IMPRESSION: No edema or airspace opacity. Heart size within normal limits. Status post aortic valve replacement. Electronically Signed   By: Lowella Grip III M.D.   On: 11/16/2020 11:46    EKG: Independently reviewed.  EKG pending  Assessment/Plan: Active Problems:   Essential hypertension   Acute lower UTI   Controlled type 2 diabetes mellitus with hyperglycemia, without long-term current use of insulin (HCC)   Chronic kidney disease (CKD), stage III (moderate) (HCC)   HTN (hypertension)   Chronic combined systolic and diastolic CHF (congestive heart failure)  (HCC)   S/P TAVR (transcatheter aortic valve replacement)   Sepsis ([place])   Left kidney mass    This patient was discussed with the ED physician, including pertinent vitals, physical exam findings, labs, and imaging.  We also discussed care given by the ED provider.  1. 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 Blood and urine culture pending b. Admit to stepdown c. Rocephin 2 g every 24 d. I discussed the patient with Dr. Claudia Desanctis of [facility].  She recommended that the patient's etiology of recurrent infections will be secondary to BPH and urinary retention.  Recommended starting patient on Flomax and a postvoid residual. e. We also discussed starting the patient on low-dose Keflex 250/day after treatment of acute illness to prevent UTIs f. CBC in the morning g. Check lactic acid level 2. Left kidney mass a. Patient has follow-up with [facility] for assessment 3. Status post TAVR approximately 6 weeks ago a. D/w Dr. Ricard Dillon.  No acute need for patient to be transported to [facility] b. Recommended echocardiogram to assure no vegetation c. Keflex to 50 mg daily for infected valve prevention as patient gets frequent UTIs 4. Stage III chronic kidney disease a. Stable 5. Hypertension 6. Combined systolic and diastolic heart failure a. Currently compensated b. We will watch fluids 7. Diabetes with hyperglycemia a. As patient currently n.p.o., will do sliding scale every 6 hours b. Hemoglobin A1c  DVT prophylaxis: Lovenox Consultants: None Code Status: Full code Family Communication: Patient's girlfriend present during interview and exam Disposition Plan: Pending   Truett Mainland, DO

## 2020-11-16 NOTE — Progress Notes (Signed)
Pharmacy Antibiotic Note  Brett Williamson is a 80 y.o. male admitted on 11/16/2020 with unknown source of infection.  Pharmacy has been consulted for Cefepime and Vancomycin dosing.  Plan: Vancomycin 2000 mg IV x 1 dose. Vancomycin 1250 mg IV every 24 hours. Cefepime 2000 mg IV every 12 hours. Monitor labs, c/s, and vanco level as indicated.  Height: 5\' 11"  (180.3 cm) Weight: 108.9 kg (240 lb) IBW/kg (Calculated) : 75.3  Temp (24hrs), Avg:100.7 F (38.2 C), Min:100.7 F (38.2 C), Max:100.7 F (38.2 C)  Recent Labs  Lab 11/16/20 1132  WBC 14.0*  CREATININE 1.44*  LATICACIDVEN 1.6    Estimated Creatinine Clearance: 50.5 mL/min (A) (by C-G formula based on SCr of 1.44 mg/dL (H)).    No Known Allergies  Antimicrobials this admission: Vanco 4/8 >>  Cefepime 4/8 >>  Flagyl 4/8 >>   Microbiology results: 4/8 BCx: pending 4/8 UCx: pending    Thank you for allowing pharmacy to be a part of this patient's care.  Ramond Craver 11/16/2020 1:43 PM

## 2020-11-16 NOTE — ED Provider Notes (Addendum)
[facility] EMERGENCY DEPARTMENT Provider Note   CSN: [id number] Arrival date & time: 11/16/20  1043     History Chief Complaint  Patient presents with  . Fever  . Altered Mental Status    Brett Williamson is a 80 y.o. male.  Patient brought in by EMS from home complaints of fever and chills yesterday.  And some confusion.  Patient's daughters been staying with him.  Apparently fever was high fever of 103.  Patient's temp upon arrival was 100.7.  Tachycardic.  Did have some confusion.  But was only complaining of low back pain.  Would follow commands.  Oxygen sats on room air were 94%.  Respirations 33 and heart rate was 112.  Patient met sepsis criteria based on the vital signs.  Sepsis protocol was initiated.  Patient denied any abdominal pain just back pain.  But did have some episodes of nausea and vomiting.  Patient denies any dysuria.  Patient's daughter states that last time this happened it was a urinary tract infection.  Patient's known to have some kind of mass on his kidney that may be cancer.  There was some question about his pancreas as well.  Patient recently had repair of his aortic valve.        Past Medical History:  Diagnosis Date  . Chronic combined systolic and diastolic CHF (congestive heart failure) ([place])   . Chronic kidney disease (CKD), stage III (moderate) (HCC)   . Diabetes ([place])   . Frequent falls   . HTN (hypertension)   . Morbid obesity ([place])   . RBBB   . 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 Burt Knack and Dr. Roxy Manns  . Severe aortic stenosis   . Subdural hematoma ([place]) 06/2020   after a mechanical fall    Patient Active Problem List   Diagnosis Date Noted  . 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 Chronic kidney disease (CKD), stage III (moderate) (HCC)   . Severe aortic stenosis    . HTN (hypertension)   . Diabetes ([place])   . Chronic combined systolic and diastolic CHF (congestive heart failure) ([place])   . Morbid obesity ([place])   . RBBB   . Acute on chronic diastolic CHF (congestive heart failure) ([place]) 09/11/2020  . Elevated troponin 09/11/2020  . Physical deconditioning 09/11/2020  . Candidiasis of skin 09/11/2020  . Acute cystitis 09/11/2020  . 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 Chronic renal failure, stage 3b ([place]) 09/11/2020  . AKI (acute kidney injury) ([place])   . History of hypertension   . Seizure prophylaxis   . Controlled type 2 diabetes mellitus with hyperglycemia, without long-term current use of insulin ([place])   . Traumatic subdural hematoma ([place]) 06/25/2020  . Pressure injury of skin 06/21/2020  . History of subdural hematoma 06/19/2020  . Generalized weakness 06/19/2020  . Hyponatremia 06/19/2020  . Essential hypertension 06/19/2020  . Subdural bleeding ([place]) 06/19/2020  . Urinary tract infection in male   . Subdural hematoma ([place]) 06/2020    Past Surgical History:  Procedure Laterality Date  . CRANIOTOMY Right 06/19/2020   Procedure: CRANIOTOMY HEMATOMA EVACUATION SUBDURAL;  Surgeon: Erline Levine, MD;  Location: [facility];  Service: Neurosurgery;  Laterality: Right;  . RIGHT/LEFT HEART CATH AND CORONARY ANGIOGRAPHY N/A 09/18/2020   Procedure: RIGHT/LEFT HEART CATH AND  CORONARY ANGIOGRAPHY;  Surgeon: Jolaine Artist, MD;  Location: [facility];  Service: Cardiovascular;  Laterality: N/A;  . ROTATOR CUFF REPAIR    . TEE WITHOUT CARDIOVERSION N/A 09/25/2020   Procedure: TRANSESOPHAGEAL ECHOCARDIOGRAM (TEE);  Surgeon: Sherren Mocha, MD;  Location: [facility];  Service: Open Heart Surgery;  Laterality: N/A;  . TRANSCATHETER AORTIC VALVE REPLACEMENT, TRANSFEMORAL N/A 09/25/2020   Procedure: TRANSCATHETER AORTIC VALVE REPLACEMENT, TRANSFEMORAL;  Surgeon: Sherren Mocha, MD;  Location: [facility];  Service: Open Heart Surgery;   Laterality: N/A;       Family History  Problem Relation Age of Onset  . Heart attack Father     Social History   Tobacco Use  . Smoking status: Never Smoker  . Smokeless tobacco: Never Used  Vaping Use  . Vaping Use: Never used  Substance Use Topics  . Alcohol use: Never  . Drug use: Never    Home Medications Prior to Admission medications   Medication Sig Start Date End Date Taking? Authorizing Provider  acetaminophen (TYLENOL) 325 MG tablet Take 2 tablets (650 mg total) by mouth every 6 (six) hours as needed. Patient taking differently: Take 650 mg by mouth every 6 (six) hours as needed for mild pain. 09/29/20  Yes Barrett, Evelene Croon, PA-C  traMADol (ULTRAM) 50 MG tablet Take 1 tablet (50 mg total) by mouth 2 (two) times daily as needed. Patient taking differently: Take 50 mg by mouth 2 (two) times daily as needed for moderate pain. 09/29/20  Yes Barrett, Evelene Croon, PA-C  aspirin EC 81 MG tablet Take 1 tablet (81 mg total) by mouth daily. Swallow whole. Patient not taking: Reported on 11/16/2020 11/15/20   Eileen Stanford, PA-C    Allergies    Patient has no known allergies.  Review of Systems   Review of Systems  Constitutional: Positive for chills and fever.  HENT: Negative for rhinorrhea and sore throat.   Eyes: Negative for visual disturbance.  Respiratory: Negative for cough and shortness of breath.   Cardiovascular: Negative for chest pain and leg swelling.  Gastrointestinal: Positive for nausea and vomiting. Negative for abdominal pain and diarrhea.  Genitourinary: Negative for dysuria.  Musculoskeletal: Positive for back pain. Negative for neck pain.  Skin: Negative for rash.  Neurological: Negative for dizziness, light-headedness and headaches.  Hematological: Does not bruise/bleed easily.  Psychiatric/Behavioral: Negative for confusion.    Physical Exam Updated Vital Signs BP 117/61   Pulse (!) 109   Temp (!) 103.2 F (39.6 C) (Oral)   Resp (!) 27    Ht 1.803 m (5' 11" )   Wt 108.9 kg   SpO2 97%   BMI 33.47 kg/m   Physical Exam Vitals and nursing note reviewed.  Constitutional:      General: He is in acute distress.     Appearance: He is well-developed.  HENT:     Head: Normocephalic and atraumatic.  Eyes:     Extraocular Movements: Extraocular movements intact.     Conjunctiva/sclera: Conjunctivae normal.     Pupils: Pupils are equal, round, and reactive to light.  Cardiovascular:     Rate and Rhythm: Regular rhythm. Tachycardia present.     Heart sounds: No murmur heard.   Pulmonary:     Effort: Pulmonary effort is normal. No respiratory distress.     Breath sounds: Normal breath sounds.  Abdominal:     Palpations: Abdomen is soft.     Tenderness: There is no abdominal tenderness.  Musculoskeletal:        General: No tenderness.     Cervical back: Neck supple.     Comments: Back nontender to palpation.  Skin:    General: Skin is warm and dry.     Capillary Refill: Capillary refill takes less than 2 seconds.  Neurological:     General: No focal deficit present.     Mental Status: He is alert and oriented to person, place, and time.     Cranial Nerves: No cranial nerve deficit.     Sensory: No sensory deficit.     ED Results / Procedures / Treatments   Labs (all labs ordered are listed, but only abnormal results are displayed) Labs Reviewed  LACTIC ACID, PLASMA - Abnormal; Notable for the following components:      Result Value   Lactic Acid, Venous 2.9 (*)    All other components within normal limits  COMPREHENSIVE METABOLIC PANEL - Abnormal; Notable for the following components:   Sodium 129 (*)    CO2 19 (*)    Glucose, Bld 252 (*)    BUN 36 (*)    Creatinine, Ser 1.44 (*)    Albumin 3.4 (*)    Total Bilirubin 1.3 (*)    GFR, Estimated 49 (*)    All other components within normal limits  CBC WITH DIFFERENTIAL/PLATELET - Abnormal; Notable for the following components:   WBC 14.0 (*)    MCH 25.8 (*)     RDW 18.4 (*)    Platelets 123 (*)    Neutro Abs 12.3 (*)    Lymphs Abs 0.3 (*)    Monocytes Absolute 1.3 (*)    Abs Immature Granulocytes 0.14 (*)    All other components within normal limits  URINALYSIS, ROUTINE W REFLEX MICROSCOPIC - Abnormal; Notable for the following components:   APPearance HAZY (*)    Glucose, UA >=500 (*)    Hgb urine dipstick MODERATE (*)    Ketones, ur 5 (*)    Protein, ur 100 (*)    Nitrite POSITIVE (*)    Leukocytes,Ua LARGE (*)    WBC, UA >50 (*)    Bacteria, UA MANY (*)    All other components within normal limits  RESP PANEL BY RT-PCR (FLU A&B, COVID) ARPGX2  CULTURE, BLOOD (ROUTINE X 2)  CULTURE, BLOOD (ROUTINE X 2)  URINE CULTURE  LACTIC ACID, PLASMA  PROTIME-INR  APTT    EKG None  Radiology DG Chest Port 1 View  Result Date: 11/16/2020 CLINICAL DATA:  Fever with altered mental status EXAM: PORTABLE CHEST 1 VIEW COMPARISON:  September 24, 2020 FINDINGS: Lungs are clear. Heart size and pulmonary vascularity are normal. No adenopathy. Patient is status post aortic valve replacement. There is degenerative change in each shoulder. IMPRESSION: No edema or airspace opacity. Heart size within normal limits. Status post aortic valve replacement. Electronically Signed   By: Lowella Grip III M.D.   On: 11/16/2020 11:46    Procedures Procedures   CRITICAL CARE Performed by: Fredia Sorrow Total critical care time: 60 minutes Critical care time was exclusive of separately billable procedures and treating other patients. Critical care was necessary to treat or prevent imminent or life-threatening deterioration. Critical care was time spent personally by me on the following activities: development of treatment plan with patient and/or surrogate as well as nursing, discussions with consultants, evaluation of patient's response to treatment, examination of patient, obtaining history from patient or surrogate, ordering and performing treatments and  interventions,  ordering and review of laboratory studies, ordering and review of radiographic studies, pulse oximetry and re-evaluation of patient's condition.   Medications Ordered in ED Medications  lactated ringers infusion ( Intravenous New Bag/Given 11/16/20 1446)  ceFEPIme (MAXIPIME) 2 g in sodium chloride 0.9 % 100 mL IVPB (has no administration in time range)  vancomycin (VANCOREADY) IVPB 1250 mg/250 mL (has no administration in time range)  ceFEPIme (MAXIPIME) 2 g in sodium chloride 0.9 % 100 mL IVPB (0 g Intravenous Stopped 11/16/20 1447)  metroNIDAZOLE (FLAGYL) IVPB 500 mg (0 mg Intravenous Stopped 11/16/20 1416)  lactated ringers bolus 1,000 mL (0 mLs Intravenous Stopped 11/16/20 1447)    And  lactated ringers bolus 1,000 mL (0 mLs Intravenous Stopped 11/16/20 1417)    And  lactated ringers bolus 1,000 mL (0 mLs Intravenous Stopped 11/16/20 1447)    And  lactated ringers bolus 500 mL (0 mLs Intravenous Stopped 11/16/20 1418)  vancomycin (VANCOREADY) IVPB 2000 mg/400 mL (0 mg Intravenous Stopped 11/16/20 1417)  ondansetron (ZOFRAN) injection 4 mg (4 mg Intravenous Given 11/16/20 1414)  HYDROmorphone (DILAUDID) injection 0.5 mg (0.5 mg Intravenous Given 11/16/20 1533)  acetaminophen (TYLENOL) tablet 1,000 mg (1,000 mg Oral Given 11/16/20 1534)  iohexol (OMNIPAQUE) 300 MG/ML solution 100 mL (100 mLs Intravenous Contrast Given 11/16/20 1605)    ED Course  I have reviewed the triage vital signs and the nursing notes.  Pertinent labs & imaging results that were available during my care of the patient were reviewed by me and considered in my medical decision making (see chart for details).    MDM Rules/Calculators/A&P                          Patient met sepsis criteria upon arrival.  Patient did receive the 30 cc/kg fluid challenge.  Patient's initial lactic acid was normal.  But white blood cell count was 14,000.  But repeat lactic acid went up to 2.9.  Urine markedly abnormal.  Patient's daughter  did explain about the renal mass.  And with the back pain patient needs CT scan of abdomen to rule out any kind of obstructive process with the urinary tract system.  Patient started on broad-spectrum antibiotics. Patient also with his aortic valve replacement could possibly have valve infection.  Gust with Dr. Sheran Lawless on-call for hospitalist service.  But CT scan is still pending.  If patient requires some urological intervention may need to be transferred down to [facility].  CT scan of the abdomen more consistent with infection no obstructing stones.  Patient can probably stay here.  Could get echocardiogram tomorrow if needed.   Final Clinical Impression(s) / ED Diagnoses Final diagnoses:  Sepsis, due to unspecified organism, unspecified whether acute organ dysfunction present [facility])  Acute cystitis without hematuria    Rx / DC Orders ED Discharge Orders    None       Fredia Sorrow, MD 11/16/20 1650    Fredia Sorrow, MD 11/16/20 1711

## 2020-11-17 ENCOUNTER — Inpatient Hospital Stay (HOSPITAL_COMMUNITY): Payer: Medicare Other

## 2020-11-17 ENCOUNTER — Encounter (HOSPITAL_COMMUNITY): Payer: Self-pay | Admitting: Family Medicine

## 2020-11-17 DIAGNOSIS — R6520 Severe sepsis without septic shock: Secondary | ICD-10-CM

## 2020-11-17 DIAGNOSIS — R509 Fever, unspecified: Secondary | ICD-10-CM

## 2020-11-17 DIAGNOSIS — A4101 Sepsis due to Methicillin susceptible Staphylococcus aureus: Principal | ICD-10-CM

## 2020-11-17 LAB — BASIC METABOLIC PANEL
Anion gap: 8 (ref 5–15)
BUN: 37 mg/dL — ABNORMAL HIGH (ref 8–23)
CO2: 21 mmol/L — ABNORMAL LOW (ref 22–32)
Calcium: 8.1 mg/dL — ABNORMAL LOW (ref 8.9–10.3)
Chloride: 102 mmol/L (ref 98–111)
Creatinine, Ser: 1.55 mg/dL — ABNORMAL HIGH (ref 0.61–1.24)
GFR, Estimated: 45 mL/min — ABNORMAL LOW (ref >60)
Glucose, Bld: 174 mg/dL — ABNORMAL HIGH (ref 70–99)
Potassium: 3.9 mmol/L (ref 3.5–5.1)
Sodium: 131 mmol/L — ABNORMAL LOW (ref 135–145)

## 2020-11-17 LAB — BLOOD CULTURE ID PANEL (REFLEXED) - BCID2

## 2020-11-17 LAB — CBC
HCT: 37 % — ABNORMAL LOW (ref 39.0–52.0)
Hemoglobin: 11.9 g/dL — ABNORMAL LOW (ref 13.0–17.0)
MCH: 26.3 pg (ref 26.0–34.0)
MCHC: 32.2 g/dL (ref 30.0–36.0)
MCV: 81.9 fL (ref 80.0–100.0)
Platelets: 95 10*3/uL — ABNORMAL LOW (ref 150–400)
RBC: 4.52 MIL/uL (ref 4.22–5.81)
RDW: 18.8 % — ABNORMAL HIGH (ref 11.5–15.5)
WBC: 13.6 10*3/uL — ABNORMAL HIGH (ref 4.0–10.5)
nRBC: 0 % (ref 0.0–0.2)

## 2020-11-17 LAB — GLUCOSE, CAPILLARY
Glucose-Capillary: 186 mg/dL — ABNORMAL HIGH (ref 70–99)
Glucose-Capillary: 175 mg/dL — ABNORMAL HIGH (ref 70–99)
Glucose-Capillary: 146 mg/dL — ABNORMAL HIGH (ref 70–99)
Glucose-Capillary: 115 mg/dL — ABNORMAL HIGH (ref 70–99)
Glucose-Capillary: 113 mg/dL — ABNORMAL HIGH (ref 70–99)

## 2020-11-17 LAB — ECHOCARDIOGRAM COMPLETE
AR max vel: 2.17 cm2
AV Area VTI: 2.17 cm2
AV Area mean vel: 2.24 cm2
AV Mean grad: 13 mmHg
AV Peak grad: 25.2 mmHg
Ao pk vel: 2.51 m/s
Area-P 1/2: 2.03 cm2
Height: 71 in
S' Lateral: 2.9 cm
Weight: 3840 oz

## 2020-11-17 LAB — HEMOGLOBIN A1C
Hgb A1c MFr Bld: 6.9 % — ABNORMAL HIGH (ref 4.8–5.6)
Mean Plasma Glucose: 151.33 mg/dL

## 2020-11-17 LAB — MRSA PCR SCREENING: MRSA by PCR: NEGATIVE

## 2020-11-17 MED ORDER — SODIUM CHLORIDE 0.9 % IV SOLN
250.0000 mL | INTRAVENOUS | Status: DC
  Administered 2020-11-17 – 2020-11-22 (×3): 250 mL via INTRAVENOUS

## 2020-11-17 MED ORDER — CHLORHEXIDINE GLUCONATE CLOTH 2 % EX PADS
6.0000 | MEDICATED_PAD | Freq: Every day | CUTANEOUS | Status: DC
  Administered 2020-11-17 – 2020-11-26 (×9): 6 via TOPICAL

## 2020-11-17 MED ORDER — NOREPINEPHRINE 4 MG/250ML-% IV SOLN: 0.0000 ug/min | INTRAVENOUS | Status: DC

## 2020-11-17 MED ORDER — NOREPINEPHRINE 4 MG/250ML-% IV SOLN
2.0000 ug/min | INTRAVENOUS | Status: DC
  Administered 2020-11-17: 2 ug/min via INTRAVENOUS
  Filled 2020-11-17: qty 250

## 2020-11-17 MED ORDER — CEFAZOLIN SODIUM-DEXTROSE 2-4 GM/100ML-% IV SOLN
2.0000 g | Freq: Three times a day (TID) | INTRAVENOUS | Status: DC
  Administered 2020-11-17 – 2020-11-26 (×27): 2 g via INTRAVENOUS
  Filled 2020-11-17 (×29): qty 100

## 2020-11-17 NOTE — Progress Notes (Signed)
Pharmacy Antibiotic Note  Brett Williamson is a 80 y.o. male admitted on 11/16/2020 with bacteremia.  Pharmacy has been consulted for Cefazolin dosing. BCID with MSSA  Plan: Cefazolin 2gm IV q8h F/U cxs and clinical progress Monitor V/S, and labs  Height: 5\' 11"  (180.3 cm) Weight: 108.9 kg (240 lb) IBW/kg (Calculated) : 75.3  Temp (24hrs), Avg:98.8 F (37.1 C), Min:97.6 F (36.4 C), Max:99.8 F (37.7 C)  Recent Labs  Lab 11/16/20 1132 11/16/20 1333 11/16/20 1739 11/16/20 2030 11/16/20 2300 11/17/20 0507  WBC 14.0*  --   --   --   --  13.6*  CREATININE 1.44*  --   --   --  1.58* 1.55*  LATICACIDVEN 1.6 2.9* 1.5 1.6  --   --     Estimated Creatinine Clearance: 46.9 mL/min (A) (by C-G formula based on SCr of 1.55 mg/dL (H)).    No Known Allergies  Antimicrobials this admission: Vancomycin 4/8 >> 4/8 Cefepime 4/8 >> 4/8 Flagyl 4/8 >>4/8 Ceftriaxone 4/8>>4/9 Cefazolin 4/9>>  Microbiology results: 4/8 BCx: GPC in anaerobic bottles both sets. BCID +MSSA 4/8 UCx: pending  4/8 MRSA PCR is negative  Thank you for allowing pharmacy to be a part of this patient's care.  Cristy Friedlander 11/17/2020 4:16 PM

## 2020-11-17 NOTE — Progress Notes (Signed)
      INFECTIOUS DISEASE ATTENDING ADDENDUM:   Date: 11/17/2020  Patient name: Brett Williamson  Medical record number: [id number]  Date of birth: May 02, 1941   80 year old man with history of stage III chronic kidney disease diabetes mellitus hypertension obesity left kidney mass severe aortic stenosis status post TAVR 6 weeks ago with diabetic foot ulcers and necrotic lesion of left heel suspicious for deep infection and including possible osteomyelitis now admitted with sepsis on pressors with methicillin sensitive Staph aureus growing from blood cultures.  We will narrow to cefazolin for now 2D echocardiogram does not show evidence of endocarditis.  Ideally it would be best to be able to get a transesophageal echocardiogram to rule in or rule out prosthetic valve endocarditis.  He has venous stasis changes but more concerning on necrotic lesion in his left heel which worries me about potential underlying deep infection including osteomyelitis.  Currently he remains on pressors limiting our ability to perform various interventions.  He also is clearly not a candidate for a line holiday at this point in time.  We will continue cefazolin for now.  If his mental status does not improve would switch to nafcillin for superior CNS penetration.  We will repeat blood cultures.  When possible would MRI his left heel and foot      [name] 11/17/2020, 5:47 PM

## 2020-11-17 NOTE — Progress Notes (Signed)
4:43 PM RN called due to patient's BP still being soft with a MAP of 61 despite IV hydration per sepsis protocol and ongoing IV LR at 150 ml/hr.  Temporal peripheral IV Levophed to maintain a MAP of 65 will be started at this time.

## 2020-11-17 NOTE — Progress Notes (Signed)
Patient Demographics:    Brett Williamson, is a 80 y.o. male, DOB - 19-Sep-1940, YOV:[id number]  Admit date - 11/16/2020   Admitting Physician Truett Mainland, DO  Outpatient Primary MD for the patient is Neale Burly, MD  LOS - 1   Chief Complaint  Patient presents with  . Fever  . Altered Mental Status        Subjective:    [name] today has  no emesis,  No chest pain,   -Complains of fatigue weakness, wakes up to answer questions then falls back asleep - Discussed with daughter and grand-daughter, questions answered    Assessment  & Plan :    Principal Problem:   Severe sepsis with acute organ dysfunction and Septic Shock  due to methicillin susceptible Staphylococcus aureus (MSSA) (HCC) Active Problems:   Essential hypertension   Acute lower UTI   Controlled type 2 diabetes mellitus with hyperglycemia, without long-term current use of insulin (HCC)   Chronic kidney disease (CKD), stage III (moderate) (HCC)   HTN (hypertension)   Chronic combined systolic and diastolic CHF (congestive heart failure) (HCC)   S/P TAVR (transcatheter aortic valve replacement)   Left kidney mass   Brief Summary:- 80 y.o. 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 which was found incidentally during his last hospitalization approximately 6 weeks ago, history of severe aortic stenosis status post TAVR with history of recurrent UTIs admitted on 11/16/2020 with severe sepsis with acute metabolic encephalopathy and concerns for left renal cell carcinoma  A/p 1)Severe Staph Aureus Sepsis with Septic Shock--- POA-on admission patient had fevers, tachycardia, tachypnea,  Leukocytosis, as well as elevated lactic acid and altered mentation and hypotension (hemodynamic instability) -Continue IV  Levophed for pressure support -Lactic acidosis resolved with IV fluids -blood cultures from 11/16/2020 with BCID 2/4 of anaerobic cx back GPC Staph Aureus MRSA Neg--- infected foot ulcers with cellulitis noted please see photos in epic -UA is also suspicious for UTI urine cultures pending -Okay to switch from IV Rocephin to Ancef Discussed with Dr. 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--patient will need to follow-up [facility] as [facility]  5)S/p TAVR--- patient had aortic valve replacement on 09/25/2020- ---echo from 11/17/2020 shows functioning aortic valve without significant concerns  6)Chronic diastolic dysfunction CHF--Echo from 11/17/2020 with EF of 60 to 65% and grade 1 diastolic CHF--- -given sepsis pathophysiology with hemodynamic instability patient needs IV fluids but will monitor closely for any evidence of volume overload  7)DM2-A1c 6.9 reflecting excellent diabetic control PTA Use Novolog/Humalog Sliding scale insulin with Accu-Cheks/Fingersticks as ordered   8)Lower extremity pressure ulcers--- with superimposed cellulitis, antibiotics as above #1 -Wound care consult requested -Please see photos in epic -ABIs ordered  CRITICAL CARE Performed by: Roxan Hockey   Total critical care time: 43 minutes  Critical care  time was exclusive of separately billable procedures and treating other patients.  Critical care was necessary to treat or prevent imminent or life-threatening deterioration. -Severe Staph Aureus Sepsis with Septic Shock--- POA-on admission patient had fevers, tachycardia, tachypnea,   Leukocytosis, as well as elevated lactic acid and altered mentation and hypotension (hemodynamic instability) -Continue IV Levophed for pressure support  Critical care was time spent personally by me on the following activities: development of treatment plan with patient and/or surrogate as well as nursing, discussions with consultants, evaluation of patient's response to treatment, examination of patient, obtaining history from patient or surrogate, ordering and performing treatments and interventions, ordering and review of laboratory studies, ordering and review of radiographic studies, pulse oximetry and re-evaluation of patient's condition.   Disposition/Need for in-Hospital Stay- patient unable to be discharged at this time due to severe staph sepsis with septic shock requiring iv pressors  Status is: Inpatient  Remains inpatient appropriate because:please see above  Disposition: The patient is from: Home              Anticipated d/c is to: TBD              Anticipated d/c date is: > 3 days              Patient currently is not medically stable to d/c. Barriers: Not Clinically Stable-   Code Status :   Code Status: Full Code   Family Communication:    Discussed with daughter and grand-daughter, questions answered  Consults  :  ID  DVT Prophylaxis  :   - SCDs  enoxaparin (LOVENOX) injection 40 mg Start: 11/17/20 1000 SCDs Start: 11/16/20 2232  Lab Results  Component Value Date   PLT 95 (L) 11/17/2020    Inpatient Medications  Scheduled Meds: . Chlorhexidine Gluconate Cloth  6 each Topical Daily  . enoxaparin (LOVENOX) injection  40 mg Subcutaneous Q24H  . insulin aspart  0-15 Units Subcutaneous Q6H  . tamsulosin  0.4 mg Oral QPC supper   Continuous Infusions: . sodium chloride 10 mL/hr at 11/17/20 0743  . cefTRIAXone (ROCEPHIN)  IV 2 g (11/17/20 1252)  . norepinephrine (LEVOPHED) Adult infusion 4 mcg/min (11/17/20 0743)   PRN Meds:.ondansetron **OR** ondansetron  (ZOFRAN) IV, oxyCODONE-acetaminophen   Anti-infectives (From admission, onward)   Start     Dose/Rate Route Frequency Ordered Stop   11/17/20 1200  vancomycin (VANCOREADY) IVPB 1250 mg/250 mL  Status:  Discontinued        1,250 mg 166.7 mL/hr over 90 Minutes Intravenous Every 24 hours 11/16/20 1335 11/16/20 2231   11/17/20 1145  cefTRIAXone (ROCEPHIN) 2 g in sodium chloride 0.9 % 100 mL IVPB        2 g 200 mL/hr over 30 Minutes Intravenous Every 24 hours 11/16/20 2234     11/17/20 0000  ceFEPIme (MAXIPIME) 2 g in sodium chloride 0.9 % 100 mL IVPB  Status:  Discontinued        2 g 200 mL/hr over 30 Minutes Intravenous Every 12 hours 11/16/20 1254 11/16/20 2231   11/16/20 2245  cefTRIAXone (ROCEPHIN) 2 g in sodium chloride 0.9 % 100 mL IVPB  Status:  Discontinued        2 g 200 mL/hr over 30 Minutes Intravenous Every 24 hours 11/16/20 2231 11/16/20 2234   11/16/20 1130  ceFEPIme (MAXIPIME) 2 g in sodium chloride 0.9 % 100 mL IVPB        2 g 200 mL/hr over 30 Minutes Intravenous  Once 11/16/20 1116 11/16/20 1447   11/16/20 1130  metroNIDAZOLE (FLAGYL) IVPB 500 mg        500 mg 100 mL/hr over 60 Minutes Intravenous  Once 11/16/20 1116 11/16/20 1416   11/16/20 1130  vancomycin (VANCOCIN) IVPB 1000 mg/200 mL premix  Status:  Discontinued        1,000 mg 200 mL/hr over 60 Minutes Intravenous  Once 11/16/20 1116 11/16/20 1126   11/16/20 1130  vancomycin (VANCOREADY) IVPB 2000 mg/400 mL        2,000 mg 200 mL/hr over 120 Minutes Intravenous  Once 11/16/20 1126 11/16/20 1417        Objective:   Vitals:   11/17/20 0758 11/17/20 0900 11/17/20 1000 11/17/20 1100  BP:  (!) 118/53 (!) 128/51   Pulse:  69 64   Resp:  19 17   Temp: 98.2 F (36.8 C)   97.6 F (36.4 C)  TempSrc: Axillary   Axillary  SpO2:  97% 100%   Weight:      Height:        Wt Readings from Last 3 Encounters:  11/16/20 108.9 kg  09/29/20 109.1 kg  07/03/20 116.1 kg     Intake/Output Summary (Last 24 hours) at  11/17/2020 1547 Last data filed at 11/17/2020 0743 Gross per 24 hour  Intake 3104.06 ml  Output 850 ml  Net 2254.06 ml     Physical Exam  Gen:- Awake, looks tired and ill HEENT:- [place].AT, No sclera icterus Neck-Supple Neck,No JVD,.  Lungs-diminished breath sounds, no wheezing  CV- S1, S2 normal, regular  Abd-  +ve B.Sounds, Abd Soft, No tenderness,    Extremity/Skin:-Please see photos in epic and below Psych-affect is flat, oriented x3 Neuro-generalized weakness, no new focal deficits, no tremors MSK-bilateral venous stasis dermatitis, decubitus ulcers of both heels with superimposed cellulitis and necrotic eschar -Right foot  with necrotic/gangrenous-looking toes  Media Information         Document Information  Photos  [place]   11/17/2020 11:03  Attached To:  Hospital Encounter on 11/16/20   Source Information  Roxan Hockey, MD  [facility]    Media Information         Document Information  Photos  [place]   11/17/2020 11:02  Attached To:  Hospital Encounter on 11/16/20   Source Information  Roxan Hockey, MD  [facility]      Data Review:   Micro Results Recent Results (from the past 240 hour(s))  Blood Culture (routine x 2)     Status: None (Preliminary result)   Collection Time: 11/16/20 11:34 AM   Specimen: BLOOD  Result Value Ref Range Status   Specimen Description   Final    BLOOD LEFT ANTECUBITAL Performed at [facility], [address], [place], [place] [address]    Special Requests   Final    Blood Culture adequate volume BOTTLES DRAWN AEROBIC AND ANAEROBIC Performed at [facility], [address], [place], [place] [address]    Culture  Setup Time   Final    GRAM POSITIVE COCCI ANAEROBIC BOTTLE ONLY Gram Stain Report Called to,Read Back By and Verified With: ELLER @ [id number] ON [id number] BY HENDERSON L. CRITICAL RESULT CALLED TO, READ BACK BY AND VERIFIED WITH: J,MENDENHALL PHARMD @1429  11/17/20 EB Performed at [facility], [address], [place],  [address]    Culture [facility] POSITIVE COCCI  Final   Report Status PENDING  Incomplete  Blood Culture ID Panel (Reflexed)  Status: Abnormal   Collection Time: 11/16/20 11:34 AM  Result Value Ref Range Status   Enterococcus faecalis NOT DETECTED NOT DETECTED Final   Enterococcus Faecium NOT DETECTED NOT DETECTED Final   Listeria monocytogenes NOT DETECTED NOT DETECTED Final   Staphylococcus species DETECTED (A) NOT DETECTED Final    Comment: CRITICAL RESULT CALLED TO, READ BACK BY AND VERIFIED WITH: J,MENDENHALL PHARMD @1429  11/17/20 EB    Staphylococcus aureus (BCID) DETECTED (A) NOT DETECTED Final    Comment: CRITICAL RESULT CALLED TO, READ BACK BY AND VERIFIED WITH: J,MENDENHALL PHARMD @1429  11/17/20 EB    Staphylococcus epidermidis NOT DETECTED NOT DETECTED Final   Staphylococcus lugdunensis NOT DETECTED NOT DETECTED Final   Streptococcus species NOT DETECTED NOT DETECTED Final   Streptococcus agalactiae NOT DETECTED NOT DETECTED Final   Streptococcus pneumoniae NOT DETECTED NOT DETECTED Final   Streptococcus pyogenes NOT DETECTED NOT DETECTED Final   A.calcoaceticus-baumannii NOT DETECTED NOT DETECTED Final   Bacteroides fragilis NOT DETECTED NOT DETECTED Final   Enterobacterales NOT DETECTED NOT DETECTED Final   Enterobacter cloacae complex NOT DETECTED NOT DETECTED Final   Escherichia coli NOT DETECTED NOT DETECTED Final   Klebsiella aerogenes NOT DETECTED NOT DETECTED Final   Klebsiella oxytoca NOT DETECTED NOT DETECTED Final   Klebsiella pneumoniae NOT DETECTED NOT DETECTED Final   Proteus species NOT DETECTED NOT DETECTED Final   Salmonella species NOT DETECTED NOT DETECTED Final   Serratia marcescens NOT DETECTED NOT DETECTED Final   Haemophilus influenzae NOT DETECTED NOT DETECTED Final   Neisseria meningitidis NOT DETECTED NOT DETECTED Final   Pseudomonas aeruginosa NOT DETECTED NOT DETECTED Final   Stenotrophomonas  maltophilia NOT DETECTED NOT DETECTED Final   Candida albicans NOT DETECTED NOT DETECTED Final   Candida auris NOT DETECTED NOT DETECTED Final   Candida glabrata NOT DETECTED NOT DETECTED Final   Candida krusei NOT DETECTED NOT DETECTED Final   Candida parapsilosis NOT DETECTED NOT DETECTED Final   Candida tropicalis NOT DETECTED NOT DETECTED Final   Cryptococcus neoformans/gattii NOT DETECTED NOT DETECTED Final   Meth resistant mecA/C and MREJ NOT DETECTED NOT DETECTED Final    Comment: Performed at [facility], [address], [place], [place] [address]  Blood Culture (routine x 2)     Status: None (Preliminary result)   Collection Time: 11/16/20 11:41 AM   Specimen: BLOOD  Result Value Ref Range Status   Specimen Description   Final    BLOOD BLOOD RIGHT ARM Performed at [facility], [address], [place], [place] [address]    Special Requests   Final    Blood Culture results may not be optimal due to an excessive volume of blood received in culture bottles BOTTLES DRAWN AEROBIC AND ANAEROBIC Performed at [facility], [address], [place], [place] [address]    Culture  Setup Time   Final    GRAM POSITIVE COCCI ANAEROBIC BOTTLE ONLY Gram Stain Report Called to,Read Back By and Verified With: ELLER @ 9735 ON [id number] BY HENDERSON L. Performed at [facility], [address] [place], [place] [address]    Culture [place]  Final   Report Status PENDING  Incomplete  Resp Panel by RT-PCR (Flu A&B, Covid) Nasopharyngeal Swab     Status: None   Collection Time: 11/16/20 11:49 AM   Specimen: Nasopharyngeal Swab; Nasopharyngeal(NP) swabs in vial transport medium  Result Value Ref Range Status   SARS Coronavirus 2 by RT PCR NEGATIVE NEGATIVE Final  Comment: (NOTE) SARS-CoV-2 target nucleic acids are NOT DETECTED.  The SARS-CoV-2 RNA is generally detectable in upper respiratory specimens during the acute phase of infection. The lowest concentration of SARS-CoV-2  viral copies this assay can detect is 138 copies/mL. A negative result does not preclude SARS-Cov-2 infection and should not be used as the sole basis for treatment or other patient management decisions. A negative result may occur with  improper specimen collection/handling, submission of specimen other than nasopharyngeal swab, presence of viral mutation(s) within the areas targeted by this assay, and inadequate number of viral copies(<138 copies/mL). A negative result must be combined with clinical observations, patient history, and epidemiological information. The expected result is Negative.  Fact Sheet for Patients:  EntrepreneurPulse.com.au  Fact Sheet for Healthcare Providers:  IncredibleEmployment.be  This test is no t yet approved or cleared by the [place] FDA and  has been authorized for detection and/or diagnosis of SARS-CoV-2 by FDA under an Emergency Use Authorization (EUA). This EUA will remain  in effect (meaning this test can be used) for the duration of the COVID-19 declaration under Section 564(b)(1) of the Act, 21 U.S.C.section 360bbb-3(b)(1), unless the authorization is terminated  or revoked sooner.       Influenza A by PCR NEGATIVE NEGATIVE Final   Influenza B by PCR NEGATIVE NEGATIVE Final    Comment: (NOTE) The Xpert Xpress SARS-CoV-2/FLU/RSV plus assay is intended as an aid in the diagnosis of influenza from Nasopharyngeal swab specimens and should not be used as a sole basis for treatment. Nasal washings and aspirates are unacceptable for Xpert Xpress SARS-CoV-2/FLU/RSV testing.  Fact Sheet for Patients: EntrepreneurPulse.com.au  Fact Sheet for Healthcare Providers: IncredibleEmployment.be  This test is not yet approved or cleared by the [place] FDA and has been authorized for detection and/or diagnosis of SARS-CoV-2 by FDA under an Emergency Use Authorization  (EUA). This EUA will remain in effect (meaning this test can be used) for the duration of the COVID-19 declaration under Section 564(b)(1) of the Act, 21 U.S.C. section 360bbb-3(b)(1), unless the authorization is terminated or revoked.  Performed at [facility], [address] [place], [place] [address]   MRSA PCR Screening     Status: None   Collection Time: 11/17/20  3:57 AM   Specimen: Nasal Mucosa; Nasopharyngeal  Result Value Ref Range Status   MRSA by PCR NEGATIVE NEGATIVE Final    Comment:        The GeneXpert MRSA Assay (FDA approved for NASAL specimens only), is one component of a comprehensive MRSA colonization surveillance program. It is not intended to diagnose MRSA infection nor to guide or monitor treatment for MRSA infections. Performed at [facility], [address], [place], [place] [address]     Radiology Reports CT Abdomen Pelvis W Contrast  Result Date: 11/16/2020 CLINICAL DATA:  Fever and abdominal pain. History of renal cell carcinoma. EXAM: CT ABDOMEN AND PELVIS WITH CONTRAST TECHNIQUE: Multidetector CT imaging of the abdomen and pelvis was performed using the standard protocol following bolus administration of intravenous contrast. CONTRAST:  133mL OMNIPAQUE IOHEXOL 300 MG/ML  SOLN COMPARISON:  Abdominal MRI 09/22/2020.  Abdominal CT 09/20/2020 FINDINGS: Lower chest: Normal heart size post TAVR. Mild breathing motion artifact at the lung bases scattered atelectasis. No confluent consolidation or pleural effusion. Hepatobiliary: No focal liver abnormality is seen. Distended gallbladder with layering stones/sludge. No obvious pericholecystic fat stranding inflammation, there is motion artifact through this region. No biliary dilatation. Pancreas: Fatty atrophy. No ductal dilatation or inflammation.  Cystic pancreatic tail lesion on MRI is not well appreciated by CT. Spleen: Enlarged spanning approximately 15 mm cranial caudal, no focal splenic abnormality.  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 Normal right adrenal gland. There is mild right hydroureteronephrosis, with the ureter dilated to the bladder insertion. No evidence of stone or obstructing lesion. There is minimal prominence of the left ureter and renal pelvis. There is right greater than left perinephric edema. Areas of left renal cortical scarring. Enhancing lesion arising from the lower left kidney measures 2 cm. Previous right renal cyst is grossly stable. Motion artifact obscures detailed assessment. Urinary bladder is physiologically distended, question of bladder wall thickening Stomach/Bowel: Stomach is within normal limits. Appendix is not confidently visualized, no evidence of appendicitis. No evidence of bowel wall thickening, distention, or inflammatory changes. Moderate colonic stool burden. Vascular/Lymphatic: Aortic atherosclerosis. No aortic aneurysm. Small retroperitoneal lymph nodes are not enlarged by size criteria. Reproductive: Prostatic calcifications. Other: No free air.  No intra-abdominal abscess.  No ascites. Musculoskeletal: Diffuse degenerative disc disease and facet hypertrophy in the included spine. Moderate degenerative change of both hips. No acute osseous abnormality. IMPRESSION: 1. Mild right hydroureteronephrosis, with the ureter dilated to the bladder insertion. Mild prominence of the left ureter and renal pelvis. No evidence of stone or obstructing lesion. There is right greater than left perinephric edema. Findings are suspicious for urinary tract infection. 2. Enhancing 2 cm lesion arising from the lower left kidney, suspicious for renal cell carcinoma, unchanged in size from recent imaging. 3. Distended gallbladder with layering stones/sludge. No obvious pericholecystic fat stranding inflammation, there is motion artifact through this region. 4. Splenomegaly. 5. 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 6. Cystic pancreatic tail lesion on MRI is not well appreciated by CT. Aortic Atherosclerosis (ICD10-I70.0). Electronically Signed   By: Keith Rake M.D.   On: 11/16/2020 16:47   DG Chest Port 1 View  Result Date: 11/16/2020 CLINICAL DATA:  Fever with altered mental status EXAM: PORTABLE CHEST 1 VIEW COMPARISON:  September 24, 2020 FINDINGS: Lungs are clear. Heart size and pulmonary vascularity are normal. No adenopathy. Patient is status post aortic valve replacement. There is degenerative change in each shoulder. IMPRESSION: No edema or airspace opacity. Heart size within normal limits. Status post aortic valve replacement. Electronically Signed   By: Lowella Grip III M.D.   On: 11/16/2020 11:46   ECHOCARDIOGRAM COMPLETE  Result Date: 11/17/2020    ECHOCARDIOGRAM REPORT   Patient Name:   ZETH BUDAY Date of Exam: 11/17/2020 Medical Rec #:  [id number]    Height:       71.0 in Accession #:    [id number]   Weight:       240.0 lb Date of Birth:  01-13-1941     BSA:          2.278 m Patient Age:    65 years     BP:           128/51 mmHg Patient Gender: M            HR:           64 bpm. Exam Location:  [facility] Procedure: 2D Echo, Cardiac Doppler and Color Doppler Indications:    Fever  History:        Patient has prior history of Echocardiogram examinations, most                 recent 11/14/2020. CHF, Aortic Valve  Disease, Arrythmias:RBBB;                 Risk Factors:Hypertension, Diabetes and morbid obesity. Renal                 cell carcinoma, CKD.  Sonographer:    Dustin Flock RDCS Referring Phys: [place]  1. Left ventricular ejection fraction, by estimation, is 60 to 65%. The left ventricle has normal function. The left ventricle has no regional wall motion abnormalities. There is mild concentric left ventricular hypertrophy. Left ventricular diastolic parameters are consistent with Grade I diastolic dysfunction (impaired relaxation). Elevated left atrial  pressure.  2. Right ventricular systolic function is normal. The right ventricular size is normal. Tricuspid regurgitation signal is inadequate for assessing PA pressure.  3. Left atrial size was moderately dilated.  4. The mitral valve is degenerative. Mild mitral valve regurgitation. Moderate mitral annular calcification.  5. The aortic valve has been repaired/replaced. Procedure Date: 09/25/2020. A 58mm Edwards Sapien TAVR valve is present and appears well seated with normal function by Doppler interrogation. Mean gradient 55mmHg, DOI 0.44. Trivial central AI without evidence of paravalvular leak. Comparison(s): Compared to prior TTE on 11/14/20, there is no significant change. AoV mean gradient remains at 13.3. Conclusion(s)/Recommendation(s): No evidence of valvular vegetations on this transthoracic echocardiogram. Would recommend a transesophageal echocardiogram to exclude infective endocarditis if clinically indicated. FINDINGS  Left Ventricle: Left ventricular ejection fraction, by estimation, is 60 to 65%. The left ventricle has normal function. The left ventricle has no regional wall motion abnormalities. The left ventricular internal cavity size was normal in size. There is  mild concentric left ventricular hypertrophy. Left ventricular diastolic parameters are consistent with Grade I diastolic dysfunction (impaired relaxation). Elevated left atrial pressure. Right Ventricle: The right ventricular size is normal. No increase in right ventricular wall thickness. Right ventricular systolic function is normal. Tricuspid regurgitation signal is inadequate for assessing PA pressure. Left Atrium: Left atrial size was moderately dilated. Right Atrium: Right atrial size was normal in size. Pericardium: There is no evidence of pericardial effusion. Mitral Valve: The mitral valve is degenerative in appearance. There is moderate thickening of the mitral valve leaflet(s). There is moderate calcification of the mitral  valve leaflet(s). Moderate mitral annular calcification. Mild mitral valve regurgitation. Tricuspid Valve: The tricuspid valve is normal in structure. Tricuspid valve regurgitation is trivial. Aortic Valve: A 75mm Edwards Sapien TAVR valve is present and appears well seated with normal function by Doppler interrogation. Mean gradient 36mmHg, DOI 0.44. Trivial central AI without evidence of paravalvular leak. The aortic valve has been repaired/replaced. Aortic valve regurgitation is trivial. Aortic valve mean gradient measures 13.0 mmHg. Aortic valve peak gradient measures 25.2 mmHg. Aortic valve area, by VTI measures 2.17 cm. There is a 29 mm Sapien prosthetic, stented (TAVR) valve present in the aortic position. Pulmonic Valve: The pulmonic valve was normal in structure. Pulmonic valve regurgitation is not visualized. Aorta: The aortic root and ascending aorta are structurally normal, with no evidence of dilitation. IAS/Shunts: No atrial level shunt detected by color flow Doppler.  LEFT VENTRICLE PLAX 2D LVIDd:         4.78 cm  Diastology LVIDs:         2.90 cm  LV e' medial:    5.00 cm/s LV PW:         1.35 cm  LV E/e' medial:  22.2 LV IVS:        1.39 cm  LV e' lateral:  7.40 cm/s LVOT diam:     2.50 cm  LV E/e' lateral: 15.0 LV SV:         110 LV SV Index:   48 LVOT Area:     4.91 cm  RIGHT VENTRICLE RV Basal diam:  4.74 cm RV S prime:     10.60 cm/s TAPSE (M-mode): 2.4 cm LEFT ATRIUM              Index       RIGHT ATRIUM           Index LA diam:        4.30 cm  1.89 cm/m  RA Area:     17.30 cm LA Vol (A2C):   110.0 ml 48.28 ml/m RA Volume:   51.30 ml  22.52 ml/m LA Vol (A4C):   93.9 ml  41.21 ml/m LA Biplane Vol: 103.0 ml 45.21 ml/m  AORTIC VALVE AV Area (Vmax):    2.17 cm AV Area (Vmean):   2.24 cm AV Area (VTI):     2.17 cm AV Vmax:           251.00 cm/s AV Vmean:          164.000 cm/s AV VTI:            0.507 m AV Peak Grad:      25.2 mmHg AV Mean Grad:      13.0 mmHg LVOT Vmax:         111.00  cm/s LVOT Vmean:        75.000 cm/s LVOT VTI:          0.224 m LVOT/AV VTI ratio: 0.44  AORTA Ao Root diam: 2.90 cm MITRAL VALVE MV Area (PHT): 2.03 cm     SHUNTS MV Decel Time: 373 msec     Systemic VTI:  0.22 m MV E velocity: 111.00 cm/s  Systemic Diam: 2.50 cm MV A velocity: 115.00 cm/s MV E/A ratio:  0.97 Gwyndolyn Kaufman MD Electronically signed by Gwyndolyn Kaufman MD Signature Date/Time: 11/17/2020/12:03:55 PM    Final    ECHOCARDIOGRAM COMPLETE  Result Date: 11/14/2020    ECHOCARDIOGRAM REPORT   Patient Name:   DAMEK ENDE Date of Exam: 11/14/2020 Medical Rec #:  [id number]    Height:       71.0 in Accession #:    [id number]   Weight:       240.5 lb Date of Birth:  [birth year]     BSA:          2.281 m Patient Age:    24 years     BP:           150/69 mmHg Patient Gender: M            HR:           71 bpm. 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 CHF, Arrythmias:RBBB; Risk                 Factors:Hypertension and Diabetes. Sleep Apnea. CKD. Aortic  Valve: 29 mm Edwards Sapien prosthetic, stented (TAVR) valve is                 present in the aortic position. Procedure Date:09/25/2020.                 Morbid obesity.  Sonographer:    Alvino Chapel RCS Referring Phys: [id number] [place]  1. Left ventricular ejection fraction, by estimation, is 65 to 70%. The left ventricle has normal function. The left ventricle has no regional wall motion abnormalities. There is moderate left ventricular hypertrophy. Left ventricular diastolic parameters are consistent with Grade I diastolic dysfunction (impaired relaxation). Elevated left atrial pressure.  2. Right ventricular systolic function is normal. The right ventricular size is  normal.  3. Left atrial size was mildly dilated.  4. The mitral valve is normal in structure. Trivial mitral valve regurgitation. No evidence of mitral stenosis. Moderate mitral annular calcification.  5. Edwards Sapien 3 THV size 29 mm valve is in the AV position. . The aortic valve has been repaired/replaced. Aortic valve regurgitation is not visualized. No aortic stenosis is present. FINDINGS  Left Ventricle: Left ventricular ejection fraction, by estimation, is 65 to 70%. The left ventricle has normal function. The left ventricle has no regional wall motion abnormalities. The left ventricular internal cavity size was normal in size. There is  moderate left ventricular hypertrophy. Left ventricular diastolic parameters are consistent with Grade I diastolic dysfunction (impaired relaxation). Elevated left atrial pressure. Right Ventricle: The right ventricular size is normal. No increase in right ventricular wall thickness. Right ventricular systolic function is normal. Left Atrium: Left atrial size was mildly dilated. Right Atrium: Right atrial size was normal in size. Pericardium: There is no evidence of pericardial effusion. Mitral Valve: The mitral valve is normal in structure. Moderate mitral annular calcification. Trivial mitral valve regurgitation. No evidence of mitral valve stenosis. MV peak gradient, 7.4 mmHg. The mean mitral valve gradient is 3.0 mmHg. Tricuspid Valve: The tricuspid valve is normal in structure. Tricuspid valve regurgitation is not demonstrated. No evidence of tricuspid stenosis. Aortic Valve: Edwards Sapien 3 THV size 29 mm valve is in the AV position. The aortic valve has been repaired/replaced. Aortic valve regurgitation is not visualized. No aortic stenosis is present. Aortic valve mean gradient measures 13.3 mmHg. Aortic valve peak gradient measures 26.2 mmHg. Aortic valve area, by VTI measures 1.40 cm. Pulmonic Valve: The pulmonic valve was not well visualized. Pulmonic valve  regurgitation is mild. No evidence of pulmonic stenosis. Aorta: The aortic root is normal in size and structure. Pulmonary Artery: Indeterminant PASP, inadequate TR jet. Venous: The inferior vena cava was not well visualized. IAS/Shunts: No atrial level shunt detected by color flow Doppler.  LEFT VENTRICLE PLAX 2D LVIDd:         4.80 cm  Diastology LVIDs:         3.10 cm  LV e' medial:    5.11 cm/s LV PW:         1.40 cm  LV E/e' medial:  19.2 LV IVS:        1.40 cm  LV e' lateral:   6.31 cm/s LVOT diam:     1.90 cm  LV E/e' lateral: 15.5 LV SV:         73 LV SV Index:   32 LVOT Area:     2.84 cm  RIGHT VENTRICLE RV S prime:     12.30 cm/s TAPSE (M-mode): 1.9 cm LEFT ATRIUM  Index       RIGHT ATRIUM           Index LA diam:        4.80 cm 2.10 cm/m  RA Area:     17.30 cm LA Vol (A2C):   97.2 ml 42.62 ml/m RA Volume:   47.90 ml  21.00 ml/m LA Vol (A4C):   62.0 ml 27.19 ml/m LA Biplane Vol: 80.2 ml 35.17 ml/m  AORTIC VALVE AV Area (Vmax):    1.27 cm AV Area (Vmean):   1.32 cm AV Area (VTI):     1.40 cm AV Vmax:           256.00 cm/s AV Vmean:          162.333 cm/s AV VTI:            0.524 m AV Peak Grad:      26.2 mmHg AV Mean Grad:      13.3 mmHg LVOT Vmax:         115.00 cm/s LVOT Vmean:        75.300 cm/s LVOT VTI:          0.259 m LVOT/AV VTI ratio: 0.49  AORTA Ao Root diam: 3.30 cm MITRAL VALVE MV Area (PHT): 1.78 cm     SHUNTS MV Area VTI:   1.26 cm     Systemic VTI:  0.26 m MV Peak grad:  7.4 mmHg     Systemic Diam: 1.90 cm MV Mean grad:  3.0 mmHg MV Vmax:       1.36 m/s MV Vmean:      71.8 cm/s MV Decel Time: 427 msec MV E velocity: 98.00 cm/s MV A velocity: 129.00 cm/s MV E/A ratio:  0.76 Carlyle Dolly MD Electronically signed by Carlyle Dolly MD Signature Date/Time: 11/14/2020/4:43:28 PM    Final      CBC Recent Labs  Lab 11/16/20 1132 11/17/20 0507  WBC 14.0* 13.6*  HGB 14.2 11.9*  HCT 44.6 37.0*  PLT 123* 95*  MCV 81.1 81.9  MCH 25.8* 26.3  MCHC 31.8 32.2  RDW 18.4*  18.8*  LYMPHSABS 0.3*  --   MONOABS 1.3*  --   EOSABS 0.0  --   BASOSABS 0.0  --     Chemistries  Recent Labs  Lab 11/16/20 1132 11/16/20 2300 11/17/20 0507  NA 129* 130* 131*  K 4.1 3.9 3.9  CL 98 102 102  CO2 19* 19* 21*  GLUCOSE 252* 189* 174*  BUN 36* 36* 37*  CREATININE 1.44* 1.58* 1.55*  CALCIUM 8.9 8.2* 8.1*  AST 24  --   --   ALT 18  --   --   ALKPHOS 65  --   --   BILITOT 1.3*  --   --    ------------------------------------------------------------------------------------------------------------------ No results for input(s): CHOL, HDL, LDLCALC, TRIG, CHOLHDL, LDLDIRECT in the last 72 hours.  Lab Results  Component Value Date   HGBA1C 6.9 (H) 11/16/2020   ------------------------------------------------------------------------------------------------------------------ No results for input(s): TSH, T4TOTAL, T3FREE, THYROIDAB in the last 72 hours.  Invalid input(s): FREET3 ------------------------------------------------------------------------------------------------------------------ No results for input(s): VITAMINB12, FOLATE, FERRITIN, TIBC, IRON, RETICCTPCT in the last 72 hours.  Coagulation profile Recent Labs  Lab 11/16/20 1132  INR 1.2    No results for input(s): DDIMER in the last 72 hours.  Cardiac Enzymes No results for input(s): CKMB, TROPONINI, MYOGLOBIN in the last 168 hours.  Invalid input(s): CK ------------------------------------------------------------------------------------------------------------------    Component Value Date/Time   BNP 1,561.0 (  H) 09/12/2020 1561     Roxan Hockey M.D on 11/17/2020 at 3:47 PM  Go to [email or web address] - for contact info  [facility] - Office  [phone]

## 2020-11-17 NOTE — Plan of Care (Signed)
  Problem: Health Behavior/Discharge Planning: Goal: Ability to manage health-related needs will improve Outcome: Not Progressing   Problem: Clinical Measurements: Goal: Ability to maintain clinical measurements within normal limits will improve Outcome: Not Progressing Goal: Will remain free from infection Outcome: Not Progressing Goal: Diagnostic test results will improve Outcome: Not Progressing Goal: Respiratory complications will improve Outcome: Not Progressing Goal: Cardiovascular complication will be avoided Outcome: Not Progressing   Problem: Activity: Goal: Risk for activity intolerance will decrease Outcome: Not Progressing   Problem: Nutrition: Goal: Adequate nutrition will be maintained Outcome: Not Progressing

## 2020-11-17 NOTE — Progress Notes (Signed)
  Echocardiogram 2D Echocardiogram has been performed.  Brett Williamson 11/17/2020, 10:51 AM

## 2020-11-18 ENCOUNTER — Inpatient Hospital Stay (HOSPITAL_COMMUNITY): Payer: Medicare Other

## 2020-11-18 LAB — CBC
HCT: 37.6 % — ABNORMAL LOW (ref 39.0–52.0)
Hemoglobin: 11.9 g/dL — ABNORMAL LOW (ref 13.0–17.0)
MCH: 25.9 pg — ABNORMAL LOW (ref 26.0–34.0)
MCHC: 31.6 g/dL (ref 30.0–36.0)
MCV: 81.7 fL (ref 80.0–100.0)
Platelets: 80 10*3/uL — ABNORMAL LOW (ref 150–400)
RBC: 4.6 MIL/uL (ref 4.22–5.81)
RDW: 19 % — ABNORMAL HIGH (ref 11.5–15.5)
WBC: 7.7 10*3/uL (ref 4.0–10.5)
nRBC: 0 % (ref 0.0–0.2)

## 2020-11-18 LAB — GLUCOSE, CAPILLARY
Glucose-Capillary: 126 mg/dL — ABNORMAL HIGH (ref 70–99)
Glucose-Capillary: 111 mg/dL — ABNORMAL HIGH (ref 70–99)
Glucose-Capillary: 106 mg/dL — ABNORMAL HIGH (ref 70–99)
Glucose-Capillary: 160 mg/dL — ABNORMAL HIGH (ref 70–99)
Glucose-Capillary: 156 mg/dL — ABNORMAL HIGH (ref 70–99)

## 2020-11-18 LAB — BASIC METABOLIC PANEL
Anion gap: 8 (ref 5–15)
BUN: 36 mg/dL — ABNORMAL HIGH (ref 8–23)
CO2: 21 mmol/L — ABNORMAL LOW (ref 22–32)
Calcium: 7.8 mg/dL — ABNORMAL LOW (ref 8.9–10.3)
Chloride: 101 mmol/L (ref 98–111)
Creatinine, Ser: 1.26 mg/dL — ABNORMAL HIGH (ref 0.61–1.24)
GFR, Estimated: 57 mL/min — ABNORMAL LOW (ref >60)
Glucose, Bld: 106 mg/dL — ABNORMAL HIGH (ref 70–99)
Potassium: 3.7 mmol/L (ref 3.5–5.1)
Sodium: 130 mmol/L — ABNORMAL LOW (ref 135–145)

## 2020-11-18 MED ORDER — INSULIN ASPART 100 UNIT/ML ~~LOC~~ SOLN
0.0000 [IU] | Freq: Three times a day (TID) | SUBCUTANEOUS | Status: DC
  Administered 2020-11-18 (×2): 3 [IU] via SUBCUTANEOUS
  Administered 2020-11-19: 2 [IU] via SUBCUTANEOUS
  Administered 2020-11-19 (×3): 3 [IU] via SUBCUTANEOUS
  Administered 2020-11-20: 2 [IU] via SUBCUTANEOUS
  Administered 2020-11-20: 3 [IU] via SUBCUTANEOUS
  Administered 2020-11-20 (×2): 2 [IU] via SUBCUTANEOUS
  Administered 2020-11-21 (×3): 3 [IU] via SUBCUTANEOUS
  Administered 2020-11-21 – 2020-11-22 (×3): 2 [IU] via SUBCUTANEOUS
  Administered 2020-11-22 – 2020-11-23 (×3): 3 [IU] via SUBCUTANEOUS
  Administered 2020-11-23 (×2): 2 [IU] via SUBCUTANEOUS
  Administered 2020-11-24: 3 [IU] via SUBCUTANEOUS
  Administered 2020-11-24 – 2020-11-25 (×6): 2 [IU] via SUBCUTANEOUS
  Administered 2020-11-25: 3 [IU] via SUBCUTANEOUS

## 2020-11-18 MED ORDER — HYDROCERIN EX CREA
TOPICAL_CREAM | Freq: Two times a day (BID) | CUTANEOUS | Status: DC
  Filled 2020-11-18 (×2): qty 113

## 2020-11-18 NOTE — Consult Note (Addendum)
WOC Nurse Consult Note: Reason for Consult: Bilateral LE with partial thickness skin loss, bilateral heels with DTPI L>R, right posterior LE at Achilles area with eschar (stable, intact). Ischemia areas on toes of right foot.  Wound type: vascular insufficiency, pressure Pressure Injury POA: Yes Measurement: Bedside RN has documented measurements for bilateral pretibial areas.  Photo documentation taken in the ED and uploaded to EHR.  Bedside RN requested to measure bilateral heels and left Achilles wound and document on Nursing Flow Sheet. Right pretibial:  32cm x 8cm ecchymosis, edema Left Pretibial: 29cm x 8cm, ecchymosis,  Edema Left Achilles area, dry, stable eschar with surrounding erythema and warmth.  Seen yesterday by ID. Dr. Tommy Medal is monitoring this area and has noted that an MRI of the left heel and foot is indicated with possible. Left heel:  DTPI, deeply hued red/maroon, unblanchable Right heel: DTPI in evolution, healing, skin peeling and dry, discolored deeply red/maroon area in center Right foot, digits 2-4 with infarcts, dry, stable eschar. Wound bed:As noted above Drainage (amount, consistency, odor) None Periwound:As noted above Dressing procedure/placement/frequency: Guidance provided for Nursing for the care of the integumentary system:  Eucerin Cream to LEs twice daily followed by placement of ABD pad to pretibial areas, replacing twice daily. Conservative care orders are placed for bilateral feet:  feet are to be placed into pressure redistribution heel boots after placing silicone bordered foam dressings. Turning side to side in place.   POC developemnt is augmented by photos taken yesterday and uploaded to the EMR.  Vascular or Orthopedics involvement may be indicated depending on outcome of ID's continued observations.  We defer to those physician specialists and have provided only conservative topical care guidance for nursing.    [facility] nursing team will not follow, but  will remain available to this patient, the nursing and medical teams.  Please re-consult if needed. Thanks, [name], MSN, RN, [name], [name]  Pager# [phone]

## 2020-11-18 NOTE — Progress Notes (Signed)
      INFECTIOUS DISEASE ATTENDING ADDENDUM:   Date: 11/18/2020  Patient name: Brett Williamson  Medical record number: [id number]  Date of birth: [birth year]   80 year old man with history of stage III chronic kidney disease diabetes mellitus hypertension obesity left kidney mass severe aortic stenosis status post TAVR 6 weeks ago with diabetic foot ulcers and necrotic lesion of left heel suspicious for deep infection and including possible osteomyelitis now admitted with sepsis on pressors with methicillin sensitive Staph aureus growing from blood cultures.  We have narrowed to cefazolin  Ideally it would be best to be able to get a transesophageal echocardiogram to rule in or rule out prosthetic valve endocarditis.  He has venous stasis changes but more concerning on necrotic lesion in his left heel which worries me about potential underlying deep infection including osteomyelitis.  Marland Kitchen  He also is clearly not a candidate for a line holiday at this point in time.  We will continue cefazolin for now.  If his mental status does not improve would switch to nafcillin for superior CNS penetration.  We will repeat blood cultures.  When possible would MRI his left heel and foot  Continue to follow this patient remotely.  Please reach out to me to discuss as new developments occur.  When I last heard about and he was still on pressors     [name] 11/18/2020, 4:57 PM

## 2020-11-18 NOTE — Progress Notes (Signed)
Patient Demographics:    Brett Williamson, is a 80 y.o. male, DOB - March 30, 1941, EXN:[id number]  Admit date - 11/16/2020   Admitting Physician Truett Mainland, DO  Outpatient Primary MD for the patient is Neale Burly, MD  LOS - 2   Chief Complaint  Patient presents with  . Fever  . Altered Mental Status        Subjective:    Brett Williamson today has  no emesis,  No chest pain,    -Remains lethargic but overall more conversational -No fevers, no emesis,     Assessment  & Plan :    Principal Problem:   Severe sepsis with acute organ dysfunction and Septic Shock  due to methicillin susceptible Staphylococcus aureus (MSSA) (HCC) Active Problems:   Essential hypertension   Acute lower UTI   Controlled type 2 diabetes mellitus with hyperglycemia, without long-term current use of insulin (HCC)   Chronic kidney disease (CKD), stage III (moderate) (HCC)   HTN (hypertension)   Chronic combined systolic and diastolic CHF (congestive heart failure) (HCC)   S/P TAVR (transcatheter aortic valve replacement)   Left kidney mass   Brief Summary:- 80 y.o. 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 which was found incidentally during his last hospitalization approximately 6 weeks ago, history of severe aortic stenosis status post TAVR with history of recurrent UTIs admitted on 11/16/2020 with severe sepsis with acute metabolic encephalopathy and concerns for left renal cell carcinoma  A/p 1)Severe Staph Aureus Sepsis with Septic Shock--- POA-on admission patient had fevers, tachycardia, tachypnea,  Leukocytosis, as well as elevated lactic acid and altered mentation and hypotension (hemodynamic instability) -Weaned off  IV Levophed on 11/17/20  -Leukocytosis resolved -Lactic acidosis resolved  with IV fluids -blood cultures from 11/16/2020 with BCID 2/4 of anaerobic cx back GPC Staph Aureus MRSA Neg--- infected foot ulcers with cellulitis noted please see photos in epic -Repeat blood cultures from 11/17/2020 NGTD -switched from IV Rocephin to Ancef on 11/17/20 Discussed with Dr. Tommy Medal (Infectious Disease) -TTE demonstrates TAVR without evidence of vegetations or endocarditis -May need TEE due to staph bacteremia with recent prosthetic valve -Antimicrobials this admission: Vancomycin 4/8>>4/8 Cefepime 4/8>>4/8 Flagyl 4/8 >>4/8 Ceftriaxone 4/8>>4/9 Cefazolin 4/9>>  Microbiology results: 4/8BCx: GPC in anaerobic bottles both sets. 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--patient will need to [facility]  5)S/p TAVR--- patient had aortic valve replacement on 09/25/2020- ---echo from 11/17/2020 shows functioning aortic valve without significant concerns -May need TEE as above #1  6)Chronic diastolic dysfunction CHF--Echo from 11/17/2020 with EF of 60 to 65% and grade 1 diastolic CHF--- -given sepsis pathophysiology with hemodynamic instability patient needs IV fluids but will monitor closely for any evidence of volume overload  7)DM2-A1c 6.9 reflecting excellent diabetic control PTA Use Novolog/Humalog Sliding scale  insulin with Accu-Cheks/Fingersticks as ordered   8)Lower extremity pressure ulcers--- with superimposed cellulitis, antibiotics as above #1 -Wound care consult noted -Please see photos in epic -May need MRI to rule out osteo- -ABIs ordered   Disposition/Need for in-Hospital Stay- patient  unable to be discharged at this time due to severe staph sepsis with septic shock requiring iv pressors  Status is: Inpatient  Remains inpatient appropriate because:please see above  Disposition: The patient is from: Home              Anticipated d/c is to: TBD              Anticipated d/c date is: > 3 days              Patient currently is not medically stable to d/c. Barriers: Not Clinically Stable-   Code Status :   Code Status: Full Code   Family Communication:    Discussed with daughter and grand-daughter, questions answered  Consults  :  ID  DVT Prophylaxis  :   - SCDs  enoxaparin (LOVENOX) injection 40 mg Start: 11/17/20 1000 SCDs Start: 11/16/20 2232  Lab Results  Component Value Date   PLT 80 (L) 11/18/2020    Inpatient Medications  Scheduled Meds: . Chlorhexidine Gluconate Cloth  6 each Topical Daily  . enoxaparin (LOVENOX) injection  40 mg Subcutaneous Q24H  . hydrocerin   Topical BID  . insulin aspart  0-15 Units Subcutaneous TID AC & HS  . tamsulosin  0.4 mg Oral QPC supper   Continuous Infusions: . sodium chloride 10 mL/hr at 11/18/20 0714  .  ceFAZolin (ANCEF) IV 2 g (11/18/20 1657)  . norepinephrine (LEVOPHED) Adult infusion Stopped (11/17/20 1247)   PRN Meds:.ondansetron **OR** ondansetron (ZOFRAN) IV, oxyCODONE-acetaminophen   Anti-infectives (From admission, onward)   Start     Dose/Rate Route Frequency Ordered Stop   11/17/20 1715  ceFAZolin (ANCEF) IVPB 2g/100 mL premix        2 g 200 mL/hr over 30 Minutes Intravenous Every 8 hours 11/17/20 1616     11/17/20 1200  vancomycin (VANCOREADY) IVPB 1250 mg/250 mL  Status:  Discontinued        1,250 mg 166.7 mL/hr over 90 Minutes Intravenous Every 24 hours 11/16/20 1335 11/16/20 2231   11/17/20 1145  cefTRIAXone (ROCEPHIN) 2 g in sodium chloride 0.9 % 100 mL IVPB  Status:  Discontinued        2 g 200 mL/hr over 30 Minutes Intravenous Every 24 hours 11/16/20 2234 11/17/20 1559   11/17/20 0000   ceFEPIme (MAXIPIME) 2 g in sodium chloride 0.9 % 100 mL IVPB  Status:  Discontinued        2 g 200 mL/hr over 30 Minutes Intravenous Every 12 hours 11/16/20 1254 11/16/20 2231   11/16/20 2245  cefTRIAXone (ROCEPHIN) 2 g in sodium chloride 0.9 % 100 mL IVPB  Status:  Discontinued        2 g 200 mL/hr over 30 Minutes Intravenous Every 24 hours 11/16/20 2231 11/16/20 2234   11/16/20 1130  ceFEPIme (MAXIPIME) 2 g in sodium chloride 0.9 % 100 mL IVPB        2 g 200 mL/hr over 30 Minutes Intravenous  Once 11/16/20 1116 11/16/20 1447   11/16/20 1130  metroNIDAZOLE (FLAGYL) IVPB 500 mg        500 mg 100 mL/hr over 60 Minutes Intravenous  Once 11/16/20 1116 11/16/20 1416   11/16/20 1130  vancomycin (VANCOCIN) IVPB 1000  mg/200 mL premix  Status:  Discontinued        1,000 mg 200 mL/hr over 60 Minutes Intravenous  Once 11/16/20 1116 11/16/20 1126   11/16/20 1130  vancomycin (VANCOREADY) IVPB 2000 mg/400 mL        2,000 mg 200 mL/hr over 120 Minutes Intravenous  Once 11/16/20 1126 11/16/20 1417        Objective:   Vitals:   11/18/20 0736 11/18/20 0800 11/18/20 1137 11/18/20 1734  BP:  (!) 115/44    Pulse: 80 77 63   Resp: 11 15 (!) 9 20  Temp: 97.6 F (36.4 C)  (!) 97.3 F (36.3 C) (!) 97.5 F (36.4 C)  TempSrc: Oral  Oral Oral  SpO2: 97% 93% 99%   Weight:      Height:        Wt Readings from Last 3 Encounters:  11/18/20 115.7 kg  09/29/20 109.1 kg  07/03/20 116.1 kg     Intake/Output Summary (Last 24 hours) at 11/18/2020 1811 Last data filed at 11/18/2020 4132 Gross per 24 hour  Intake 574.25 ml  Output 650 ml  Net -75.75 ml     Physical Exam  Gen:- Awake, more awake, HEENT:- [place].AT, No sclera icterus Neck-Supple Neck,No JVD,.  Lungs-diminished breath sounds, no wheezing  CV- S1, S2 normal, regular  Abd-  +ve B.Sounds, Abd Soft, No tenderness,    Extremity/Skin:-Please see photos in epic and below Psych-affect is flat, oriented x3 Neuro-generalized weakness, no new  focal deficits, no tremors MSK-bilateral venous stasis dermatitis, decubitus ulcers of both heels with superimposed cellulitis and necrotic eschar -Right foot  with necrotic/gangrenous-looking toes  Media Information         Document Information  Photos  [place]   11/17/2020 11:03  Attached To:  Hospital Encounter on 11/16/20   Source Information  Roxan Hockey, MD  [facility]    Media Information         Document Information  Photos  [place]   11/17/2020 11:02  Attached To:  Hospital Encounter on 11/16/20   Source Information  Roxan Hockey, MD  [facility]      Data Review:   Micro Results Recent Results (from the past 240 hour(s))  Blood Culture (routine x 2)     Status: Abnormal (Preliminary result)   Collection Time: 11/16/20 11:34 AM   Specimen: BLOOD  Result Value Ref Range Status   Specimen Description   Final    BLOOD LEFT ANTECUBITAL Performed at [facility], [address], [place], [place] [address]    Special Requests   Final    Blood Culture adequate volume BOTTLES DRAWN AEROBIC AND ANAEROBIC Performed at [facility], [address], [place], [place] [address]    Culture  Setup Time   Final    GRAM POSITIVE COCCI ANAEROBIC BOTTLE ONLY Gram Stain Report Called to,Read Back By and Verified With: ELLER @ [id number] ON [id number] BY HENDERSON L. CRITICAL RESULT CALLED TO, READ BACK BY AND VERIFIED WITH: J,MENDENHALL PHARMD @1429  11/17/20 EB Performed at [facility], [place] [address], [place], [place] [address]    Culture STAPHYLOCOCCUS AUREUS (A)  Final   Report Status PENDING  Incomplete  Blood Culture ID Panel (Reflexed)     Status: Abnormal   Collection Time: 11/16/20 11:34 AM  Result Value Ref Range Status   Enterococcus faecalis NOT DETECTED NOT DETECTED Final   Enterococcus Faecium NOT DETECTED NOT DETECTED Final   Listeria monocytogenes NOT DETECTED  NOT DETECTED Final   Staphylococcus species DETECTED (A) NOT  DETECTED Final    Comment: CRITICAL RESULT CALLED TO, READ BACK BY AND VERIFIED WITH: J,MENDENHALL PHARMD @1429  11/17/20 EB    Staphylococcus aureus (BCID) DETECTED (A) NOT DETECTED Final    Comment: CRITICAL RESULT CALLED TO, READ BACK BY AND VERIFIED WITH: J,MENDENHALL PHARMD @1429  11/17/20 EB    Staphylococcus epidermidis NOT DETECTED NOT DETECTED Final   Staphylococcus lugdunensis NOT DETECTED NOT DETECTED Final   Streptococcus species NOT DETECTED NOT DETECTED Final   Streptococcus agalactiae NOT DETECTED NOT DETECTED Final   Streptococcus pneumoniae NOT DETECTED NOT DETECTED Final   Streptococcus pyogenes NOT DETECTED NOT DETECTED Final   A.calcoaceticus-baumannii NOT DETECTED NOT DETECTED Final   Bacteroides fragilis NOT DETECTED NOT DETECTED Final   Enterobacterales NOT DETECTED NOT DETECTED Final   Enterobacter cloacae complex NOT DETECTED NOT DETECTED Final   Escherichia coli NOT DETECTED NOT DETECTED Final   Klebsiella aerogenes NOT DETECTED NOT DETECTED Final   Klebsiella oxytoca NOT DETECTED NOT DETECTED Final   Klebsiella pneumoniae NOT DETECTED NOT DETECTED Final   Proteus species NOT DETECTED NOT DETECTED Final   Salmonella species NOT DETECTED NOT DETECTED Final   Serratia marcescens NOT DETECTED NOT DETECTED Final   Haemophilus influenzae NOT DETECTED NOT DETECTED Final   Neisseria meningitidis NOT DETECTED NOT DETECTED Final   Pseudomonas aeruginosa NOT DETECTED NOT DETECTED Final   Stenotrophomonas maltophilia NOT DETECTED NOT DETECTED Final   Candida albicans NOT DETECTED NOT DETECTED Final   Candida auris NOT DETECTED NOT DETECTED Final   Candida glabrata NOT DETECTED NOT DETECTED Final   Candida krusei NOT DETECTED NOT DETECTED Final   Candida parapsilosis NOT DETECTED NOT DETECTED Final   Candida tropicalis NOT DETECTED NOT DETECTED Final   Cryptococcus neoformans/gattii NOT DETECTED NOT DETECTED Final   Meth resistant mecA/C and MREJ NOT DETECTED NOT DETECTED  Final    Comment: Performed at [facility], [address] [place], [place] [address]  Blood Culture (routine x 2)     Status: Abnormal (Preliminary result)   Collection Time: 11/16/20 11:41 AM   Specimen: BLOOD  Result Value Ref Range Status   Specimen Description   Final    BLOOD BLOOD RIGHT ARM Performed at [facility], [address], [place], [place] [address]    Special Requests   Final    Blood Culture results may not be optimal due to an excessive volume of blood received in culture bottles BOTTLES DRAWN AEROBIC AND ANAEROBIC Performed at [facility], [address], [place], [place] [address]    Culture  Setup Time   Final    GRAM POSITIVE COCCI ANAEROBIC BOTTLE ONLY Gram Stain Report Called to,Read Back By and Verified With: ELLER @ [id number] ON [id number] BY HENDERSON L. Performed at [facility], [address], [place], [place] [address]    Culture STAPHYLOCOCCUS AUREUS (A)  Final   Report Status PENDING  Incomplete  Resp Panel by RT-PCR (Flu A&B, Covid) Nasopharyngeal Swab     Status: None   Collection Time: 11/16/20 11:49 AM   Specimen: Nasopharyngeal Swab; Nasopharyngeal(NP) swabs in vial transport medium  Result Value Ref Range Status   SARS Coronavirus 2 by RT PCR NEGATIVE NEGATIVE Final    Comment: (NOTE) SARS-CoV-2 target nucleic acids are NOT DETECTED.  The SARS-CoV-2 RNA is generally detectable in upper respiratory specimens during the acute phase of infection. The lowest concentration of SARS-CoV-2 viral copies this assay can detect  is 138 copies/mL. A negative result does not preclude SARS-Cov-2 infection and should not be used as the sole basis for treatment or other patient management decisions. A negative result may occur with  improper specimen collection/handling, submission of specimen other than nasopharyngeal swab, presence of viral mutation(s) within the areas targeted by this assay, and inadequate number of viral copies(<138 copies/mL). A negative  result must be combined with clinical observations, patient history, and epidemiological information. The expected result is Negative.  Fact Sheet for Patients:  EntrepreneurPulse.com.au  Fact Sheet for Healthcare Providers:  IncredibleEmployment.be  This test is no t yet approved or cleared by the [place] FDA and  has been authorized for detection and/or diagnosis of SARS-CoV-2 by FDA under an Emergency Use Authorization (EUA). This EUA will remain  in effect (meaning this test can be used) for the duration of the COVID-19 declaration under Section 564(b)(1) of the Act, 21 U.S.C.section 360bbb-3(b)(1), unless the authorization is terminated  or revoked sooner.       Influenza A by PCR NEGATIVE NEGATIVE Final   Influenza B by PCR NEGATIVE NEGATIVE Final    Comment: (NOTE) The Xpert Xpress SARS-CoV-2/FLU/RSV plus assay is intended as an aid in the diagnosis of influenza from Nasopharyngeal swab specimens and should not be used as a sole basis for treatment. Nasal washings and aspirates are unacceptable for Xpert Xpress SARS-CoV-2/FLU/RSV testing.  Fact Sheet for Patients: EntrepreneurPulse.com.au  Fact Sheet for Healthcare Providers: IncredibleEmployment.be  This test is not yet approved or cleared by the [place] FDA and has been authorized for detection and/or diagnosis of SARS-CoV-2 by FDA under an Emergency Use Authorization (EUA). This EUA will remain in effect (meaning this test can be used) for the duration of the COVID-19 declaration under Section 564(b)(1) of the Act, 21 U.S.C. section 360bbb-3(b)(1), unless the authorization is terminated or revoked.  Performed at [facility], [address], [place], [place] [address]   Urine culture     Status: Abnormal (Preliminary result)   Collection Time: 11/16/20 12:36 PM   Specimen: Urine, Catheterized  Result Value Ref Range Status    Specimen Description   Final    URINE, CATHETERIZED Performed at [facility], [address] [place], [place] [address]    Special Requests   Final    NONE Performed at [facility], [address] [place], [place] [address]    Culture (A)  Final    >=100,000 COLONIES/mL GRAM NEGATIVE RODS IDENTIFICATION AND SUSCEPTIBILITIES TO FOLLOW Performed at [facility], [place] [address], [place], [place] [address]    Report Status PENDING  Incomplete  MRSA PCR Screening     Status: None   Collection Time: 11/17/20  3:57 AM   Specimen: Nasal Mucosa; Nasopharyngeal  Result Value Ref Range Status   MRSA by PCR NEGATIVE NEGATIVE Final    Comment:        The GeneXpert MRSA Assay (FDA approved for NASAL specimens only), is one component of a comprehensive MRSA colonization surveillance program. It is not intended to diagnose MRSA infection nor to guide or monitor treatment for MRSA infections. Performed at [facility], [address], [place], [place] [address]   Culture, blood (Routine X 2) w Reflex to ID Panel     Status: None (Preliminary result)   Collection Time: 11/17/20  7:37 PM   Specimen: Left Antecubital; Blood  Result Value Ref Range Status   Specimen Description LEFT ANTECUBITAL  Final   Special Requests  Final    Blood Culture adequate volume BOTTLES DRAWN AEROBIC AND ANAEROBIC Performed at [facility], [address], [place], [place] [address]    Culture PENDING  Incomplete   Report Status PENDING  Incomplete  Culture, blood (Routine X 2) w Reflex to ID Panel     Status: None (Preliminary result)   Collection Time: 11/17/20  7:42 PM   Specimen: BLOOD RIGHT HAND  Result Value Ref Range Status   Specimen Description BLOOD RIGHT HAND  Final   Special Requests   Final    Blood Culture results may not be optimal due to an inadequate volume of blood received in culture bottles BOTTLES DRAWN AEROBIC AND ANAEROBIC Performed at [facility], [address]  [place], [place] [address]    Culture PENDING  Incomplete   Report Status PENDING  Incomplete    Radiology Reports CT Abdomen Pelvis W Contrast  Result Date: 11/16/2020 CLINICAL DATA:  Fever and abdominal pain. History of renal cell carcinoma. EXAM: CT ABDOMEN AND PELVIS WITH CONTRAST TECHNIQUE: Multidetector CT imaging of the abdomen and pelvis was performed using the standard protocol following bolus administration of intravenous contrast. CONTRAST:  132mL OMNIPAQUE IOHEXOL 300 MG/ML  SOLN COMPARISON:  Abdominal MRI 09/22/2020.  Abdominal CT 09/20/2020 FINDINGS: Lower chest: Normal heart size post TAVR. Mild breathing motion artifact at the lung bases scattered atelectasis. No confluent consolidation or pleural effusion. Hepatobiliary: No focal liver abnormality is seen. Distended gallbladder with layering stones/sludge. No obvious pericholecystic fat stranding inflammation, there is motion artifact through this region. No biliary dilatation. Pancreas: Fatty atrophy. No ductal dilatation or inflammation. Cystic pancreatic tail lesion on MRI is not well appreciated by CT. Spleen: Enlarged spanning approximately 15 mm cranial caudal, no focal splenic abnormality. 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 Normal right adrenal gland. There is mild right hydroureteronephrosis, with the ureter dilated to the bladder insertion. No evidence of stone or obstructing lesion. There is minimal prominence of the left ureter and renal pelvis. There is right greater than left perinephric edema. Areas of left renal cortical scarring. Enhancing lesion arising from the lower left kidney measures 2 cm. Previous right renal cyst is grossly stable. Motion artifact obscures detailed assessment. Urinary bladder is physiologically distended, question of bladder wall thickening Stomach/Bowel: Stomach is within normal limits. Appendix is not confidently visualized, no evidence of  appendicitis. No evidence of bowel wall thickening, distention, or inflammatory changes. Moderate colonic stool burden. Vascular/Lymphatic: Aortic atherosclerosis. No aortic aneurysm. Small retroperitoneal lymph nodes are not enlarged by size criteria. Reproductive: Prostatic calcifications. Other: No free air.  No intra-abdominal abscess.  No ascites. Musculoskeletal: Diffuse degenerative disc disease and facet hypertrophy in the included spine. Moderate degenerative change of both hips. No acute osseous abnormality. IMPRESSION: 1. Mild right hydroureteronephrosis, with the ureter dilated to the bladder insertion. Mild prominence of the left ureter and renal pelvis. No evidence of stone or obstructing lesion. There is right greater than left perinephric edema. Findings are suspicious for urinary tract infection. 2. Enhancing 2 cm lesion arising from the lower left kidney, suspicious for renal cell carcinoma, unchanged in size from recent imaging. 3. Distended gallbladder with layering stones/sludge. No obvious pericholecystic fat stranding inflammation, there is motion artifact through this region. 4. Splenomegaly. 5. 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 6. Cystic pancreatic tail lesion on MRI is not well appreciated by CT. Aortic Atherosclerosis (ICD10-I70.0). Electronically Signed   By: Keith Rake M.D.   On:  11/16/2020 16:47   US ARTERIAL ABI (SCREENING LOWER EXTREMITY)  Result Date: 11/18/2020 CLINICAL DATA:  80 year old male with bilateral lower extremity wounds. EXAM: NONINVASIVE PHYSIOLOGIC VASCULAR STUDY OF BILATERAL LOWER EXTREMITIES TECHNIQUE: Evaluation of both lower extremities were performed at rest, including calculation of ankle-brachial indices with single level Doppler, pressure and pulse volume recording. COMPARISON:  None. FINDINGS: Right ABI:  1.76 Left ABI:  1.67 Right Lower Extremity: Monophasic, dampened waveforms at the level of the ankle. Left Lower Extremity:  Monophasic, dampened waveforms at the level of the ankle. IMPRESSION: Nondiagnostic ankle-brachial indices secondary to noncompressible vessels. Consider lower extremity arterial duplex for further characterization and evaluation for flow-limiting stenoses. Ruthann Cancer, MD [facility] Electronically Signed   By: Ruthann Cancer MD   On: 11/18/2020 09:41   DG Chest Port 1 View  Result Date: 11/16/2020 CLINICAL DATA:  Fever with altered mental status EXAM: PORTABLE CHEST 1 VIEW COMPARISON:  September 24, 2020 FINDINGS: Lungs are clear. Heart size and pulmonary vascularity are normal. No adenopathy. Patient is status post aortic valve replacement. There is degenerative change in each shoulder. IMPRESSION: No edema or airspace opacity. Heart size within normal limits. Status post aortic valve replacement. Electronically Signed   By: Lowella Grip III M.D.   On: 11/16/2020 11:46   ECHOCARDIOGRAM COMPLETE  Result Date: 11/17/2020    ECHOCARDIOGRAM REPORT   Patient Name:   DELONTA YOHANNES Date of Exam: 11/17/2020 Medical Rec #:  [id number]    Height:       71.0 in Accession #:    [id number]   Weight:       240.0 lb Date of Birth:  1940-10-07     BSA:          2.278 m Patient Age:    28 years     BP:           128/51 mmHg Patient Gender: M            HR:           64 bpm. Exam Location:  [facility] Procedure: 2D Echo, Cardiac Doppler and Color Doppler Indications:    Fever  History:        Patient has prior history of Echocardiogram examinations, most                 recent 11/14/2020. CHF, Aortic Valve Disease, Arrythmias:RBBB;                 Risk Factors:Hypertension, Diabetes and morbid obesity. Renal                 cell carcinoma, CKD.  Sonographer:    Dustin Flock RDCS Referring Phys: [place]  1. Left ventricular ejection fraction, by estimation, is 60 to 65%. The left ventricle has normal function. The left ventricle has no  regional wall motion abnormalities. There is mild concentric left ventricular hypertrophy. Left ventricular diastolic parameters are consistent with Grade I diastolic dysfunction (impaired relaxation). Elevated left atrial pressure.  2. Right ventricular systolic function is normal. The right ventricular size is normal. Tricuspid regurgitation signal is inadequate for assessing PA pressure.  3. Left atrial size was moderately dilated.  4. The mitral valve is degenerative. Mild mitral valve regurgitation. Moderate mitral annular calcification.  5. The aortic valve has been repaired/replaced. Procedure Date: 09/25/2020. A 93mm Edwards Sapien TAVR valve is present and appears well seated with normal function by Doppler interrogation. Mean  gradient 86mmHg, DOI 0.44. Trivial central AI without evidence of paravalvular leak. Comparison(s): Compared to prior TTE on 11/14/20, there is no significant change. AoV mean gradient remains at 13.3. Conclusion(s)/Recommendation(s): No evidence of valvular vegetations on this transthoracic echocardiogram. Would recommend a transesophageal echocardiogram to exclude infective endocarditis if clinically indicated. FINDINGS  Left Ventricle: Left ventricular ejection fraction, by estimation, is 60 to 65%. The left ventricle has normal function. The left ventricle has no regional wall motion abnormalities. The left ventricular internal cavity size was normal in size. There is  mild concentric left ventricular hypertrophy. Left ventricular diastolic parameters are consistent with Grade I diastolic dysfunction (impaired relaxation). Elevated left atrial pressure. Right Ventricle: The right ventricular size is normal. No increase in right ventricular wall thickness. Right ventricular systolic function is normal. Tricuspid regurgitation signal is inadequate for assessing PA pressure. Left Atrium: Left atrial size was moderately dilated. Right Atrium: Right atrial size was normal in size.  Pericardium: There is no evidence of pericardial effusion. Mitral Valve: The mitral valve is degenerative in appearance. There is moderate thickening of the mitral valve leaflet(s). There is moderate calcification of the mitral valve leaflet(s). Moderate mitral annular calcification. Mild mitral valve regurgitation. Tricuspid Valve: The tricuspid valve is normal in structure. Tricuspid valve regurgitation is trivial. Aortic Valve: A 67mm Edwards Sapien TAVR valve is present and appears well seated with normal function by Doppler interrogation. Mean gradient 18mmHg, DOI 0.44. Trivial central AI without evidence of paravalvular leak. The aortic valve has been repaired/replaced. Aortic valve regurgitation is trivial. Aortic valve mean gradient measures 13.0 mmHg. Aortic valve peak gradient measures 25.2 mmHg. Aortic valve area, by VTI measures 2.17 cm. There is a 29 mm Sapien prosthetic, stented (TAVR) valve present in the aortic position. Pulmonic Valve: The pulmonic valve was normal in structure. Pulmonic valve regurgitation is not visualized. Aorta: The aortic root and ascending aorta are structurally normal, with no evidence of dilitation. IAS/Shunts: No atrial level shunt detected by color flow Doppler.  LEFT VENTRICLE PLAX 2D LVIDd:         4.78 cm  Diastology LVIDs:         2.90 cm  LV e' medial:    5.00 cm/s LV PW:         1.35 cm  LV E/e' medial:  22.2 LV IVS:        1.39 cm  LV e' lateral:   7.40 cm/s LVOT diam:     2.50 cm  LV E/e' lateral: 15.0 LV SV:         110 LV SV Index:   48 LVOT Area:     4.91 cm  RIGHT VENTRICLE RV Basal diam:  4.74 cm RV S prime:     10.60 cm/s TAPSE (M-mode): 2.4 cm LEFT ATRIUM              Index       RIGHT ATRIUM           Index LA diam:        4.30 cm  1.89 cm/m  RA Area:     17.30 cm LA Vol (A2C):   110.0 ml 48.28 ml/m RA Volume:   51.30 ml  22.52 ml/m LA Vol (A4C):   93.9 ml  41.21 ml/m LA Biplane Vol: 103.0 ml 45.21 ml/m  AORTIC VALVE AV Area (Vmax):    2.17 cm AV  Area (Vmean):   2.24 cm AV Area (VTI):     2.17 cm AV Vmax:  251.00 cm/s AV Vmean:          164.000 cm/s AV VTI:            0.507 m AV Peak Grad:      25.2 mmHg AV Mean Grad:      13.0 mmHg LVOT Vmax:         111.00 cm/s LVOT Vmean:        75.000 cm/s LVOT VTI:          0.224 m LVOT/AV VTI ratio: 0.44  AORTA Ao Root diam: 2.90 cm MITRAL VALVE MV Area (PHT): 2.03 cm     SHUNTS MV Decel Time: 373 msec     Systemic VTI:  0.22 m MV E velocity: 111.00 cm/s  Systemic Diam: 2.50 cm MV A velocity: 115.00 cm/s MV E/A ratio:  0.97 Gwyndolyn Kaufman MD Electronically signed by Gwyndolyn Kaufman MD Signature Date/Time: 11/17/2020/12:03:55 PM    Final    ECHOCARDIOGRAM COMPLETE  Result Date: 11/14/2020    ECHOCARDIOGRAM REPORT   Patient Name:   ARISTOTLE LIEB Date of Exam: 11/14/2020 Medical Rec #:  [id number]    Height:       71.0 in Accession #:    [id number]   Weight:       240.5 lb Date of Birth:  March 06, 1941     BSA:          2.281 m Patient Age:    23 years     BP:           150/69 mmHg Patient Gender: M            HR:           71 bpm. 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 CHF, Arrythmias:RBBB; Risk                 Factors:Hypertension and Diabetes. Sleep Apnea. CKD. Aortic                 Valve: 29 mm Edwards Sapien prosthetic, stented (TAVR) valve is                 present in the aortic position. Procedure Date:09/25/2020.                 Morbid obesity.  Sonographer:    Alvino Chapel RCS Referring Phys: [id number] [place]  1. Left ventricular ejection fraction, by estimation, is 65 to 70%. The left ventricle has normal function. The left ventricle has no regional wall motion abnormalities. There is moderate left ventricular hypertrophy.  Left ventricular diastolic parameters are consistent with Grade I diastolic dysfunction (impaired relaxation). Elevated left atrial pressure.  2. Right ventricular systolic function is normal. The right ventricular size is normal.  3. Left atrial size was mildly dilated.  4. The mitral valve is normal in structure. Trivial mitral valve regurgitation. No evidence of mitral stenosis. Moderate mitral annular calcification.  5. Edwards Sapien 3 THV size 29 mm valve is in the AV position. . The aortic valve has been repaired/replaced. Aortic valve regurgitation is not visualized.  No aortic stenosis is present. FINDINGS  Left Ventricle: Left ventricular ejection fraction, by estimation, is 65 to 70%. The left ventricle has normal function. The left ventricle has no regional wall motion abnormalities. The left ventricular internal cavity size was normal in size. There is  moderate left ventricular hypertrophy. Left ventricular diastolic parameters are consistent with Grade I diastolic dysfunction (impaired relaxation). Elevated left atrial pressure. Right Ventricle: The right ventricular size is normal. No increase in right ventricular wall thickness. Right ventricular systolic function is normal. Left Atrium: Left atrial size was mildly dilated. Right Atrium: Right atrial size was normal in size. Pericardium: There is no evidence of pericardial effusion. Mitral Valve: The mitral valve is normal in structure. Moderate mitral annular calcification. Trivial mitral valve regurgitation. No evidence of mitral valve stenosis. MV peak gradient, 7.4 mmHg. The mean mitral valve gradient is 3.0 mmHg. Tricuspid Valve: The tricuspid valve is normal in structure. Tricuspid valve regurgitation is not demonstrated. No evidence of tricuspid stenosis. Aortic Valve: Edwards Sapien 3 THV size 29 mm valve is in the AV position. The aortic valve has been repaired/replaced. Aortic valve regurgitation is not visualized. No aortic stenosis is  present. Aortic valve mean gradient measures 13.3 mmHg. Aortic valve peak gradient measures 26.2 mmHg. Aortic valve area, by VTI measures 1.40 cm. Pulmonic Valve: The pulmonic valve was not well visualized. Pulmonic valve regurgitation is mild. No evidence of pulmonic stenosis. Aorta: The aortic root is normal in size and structure. Pulmonary Artery: Indeterminant PASP, inadequate TR jet. Venous: The inferior vena cava was not well visualized. IAS/Shunts: No atrial level shunt detected by color flow Doppler.  LEFT VENTRICLE PLAX 2D LVIDd:         4.80 cm  Diastology LVIDs:         3.10 cm  LV e' medial:    5.11 cm/s LV PW:         1.40 cm  LV E/e' medial:  19.2 LV IVS:        1.40 cm  LV e' lateral:   6.31 cm/s LVOT diam:     1.90 cm  LV E/e' lateral: 15.5 LV SV:         73 LV SV Index:   32 LVOT Area:     2.84 cm  RIGHT VENTRICLE RV S prime:     12.30 cm/s TAPSE (M-mode): 1.9 cm LEFT ATRIUM             Index       RIGHT ATRIUM           Index LA diam:        4.80 cm 2.10 cm/m  RA Area:     17.30 cm LA Vol (A2C):   97.2 ml 42.62 ml/m RA Volume:   47.90 ml  21.00 ml/m LA Vol (A4C):   62.0 ml 27.19 ml/m LA Biplane Vol: 80.2 ml 35.17 ml/m  AORTIC VALVE AV Area (Vmax):    1.27 cm AV Area (Vmean):   1.32 cm AV Area (VTI):     1.40 cm AV Vmax:           256.00 cm/s AV Vmean:          162.333 cm/s AV VTI:            0.524 m AV Peak Grad:      26.2 mmHg AV Mean Grad:      13.3 mmHg LVOT Vmax:         115.00 cm/s  LVOT Vmean:        75.300 cm/s LVOT VTI:          0.259 m LVOT/AV VTI ratio: 0.49  AORTA Ao Root diam: 3.30 cm MITRAL VALVE MV Area (PHT): 1.78 cm     SHUNTS MV Area VTI:   1.26 cm     Systemic VTI:  0.26 m MV Peak grad:  7.4 mmHg     Systemic Diam: 1.90 cm MV Mean grad:  3.0 mmHg MV Vmax:       1.36 m/s MV Vmean:      71.8 cm/s MV Decel Time: 427 msec MV E velocity: 98.00 cm/s MV A velocity: 129.00 cm/s MV E/A ratio:  0.76 Carlyle Dolly MD Electronically signed by Carlyle Dolly MD Signature  Date/Time: 11/14/2020/4:43:28 PM    Final      CBC Recent Labs  Lab 11/16/20 1132 11/17/20 0507 11/18/20 0608  WBC 14.0* 13.6* 7.7  HGB 14.2 11.9* 11.9*  HCT 44.6 37.0* 37.6*  PLT 123* 95* 80*  MCV 81.1 81.9 81.7  MCH 25.8* 26.3 25.9*  MCHC 31.8 32.2 31.6  RDW 18.4* 18.8* 19.0*  LYMPHSABS 0.3*  --   --   MONOABS 1.3*  --   --   EOSABS 0.0  --   --   BASOSABS 0.0  --   --     Chemistries  Recent Labs  Lab 11/16/20 1132 11/16/20 2300 11/17/20 0507 11/18/20 0608  NA 129* 130* 131* 130*  K 4.1 3.9 3.9 3.7  CL 98 102 102 101  CO2 19* 19* 21* 21*  GLUCOSE 252* 189* 174* 106*  BUN 36* 36* 37* 36*  CREATININE 1.44* 1.58* 1.55* 1.26*  CALCIUM 8.9 8.2* 8.1* 7.8*  AST 24  --   --   --   ALT 18  --   --   --   ALKPHOS 65  --   --   --   BILITOT 1.3*  --   --   --    ------------------------------------------------------------------------------------------------------------------ No results for input(s): CHOL, HDL, LDLCALC, TRIG, CHOLHDL, LDLDIRECT in the last 72 hours.  Lab Results  Component Value Date   HGBA1C 6.9 (H) 11/16/2020   ------------------------------------------------------------------------------------------------------------------ No results for input(s): TSH, T4TOTAL, T3FREE, THYROIDAB in the last 72 hours.  Invalid input(s): FREET3 ------------------------------------------------------------------------------------------------------------------ No results for input(s): VITAMINB12, FOLATE, FERRITIN, TIBC, IRON, RETICCTPCT in the last 72 hours.  Coagulation profile Recent Labs  Lab 11/16/20 1132  INR 1.2    No results for input(s): DDIMER in the last 72 hours.  Cardiac Enzymes No results for input(s): CKMB, TROPONINI, MYOGLOBIN in the last 168 hours.  Invalid input(s): CK ------------------------------------------------------------------------------------------------------------------    Component Value Date/Time   BNP 1,561.0 (H) 09/12/2020  6144     Roxan Hockey M.D on 11/18/2020 at 6:11 PM  Go to [email or web address] - for contact info  [facility] - Office  [phone]

## 2020-11-19 DIAGNOSIS — Z515 Encounter for palliative care: Secondary | ICD-10-CM

## 2020-11-19 DIAGNOSIS — Z66 Do not resuscitate: Secondary | ICD-10-CM

## 2020-11-19 DIAGNOSIS — Z7189 Other specified counseling: Secondary | ICD-10-CM

## 2020-11-19 LAB — GLUCOSE, CAPILLARY
Glucose-Capillary: 161 mg/dL — ABNORMAL HIGH (ref 70–99)
Glucose-Capillary: 162 mg/dL — ABNORMAL HIGH (ref 70–99)
Glucose-Capillary: 179 mg/dL — ABNORMAL HIGH (ref 70–99)
Glucose-Capillary: 138 mg/dL — ABNORMAL HIGH (ref 70–99)
Glucose-Capillary: 142 mg/dL — ABNORMAL HIGH (ref 70–99)

## 2020-11-19 LAB — CULTURE, BLOOD (ROUTINE X 2): Special Requests: ADEQUATE

## 2020-11-19 LAB — URINE CULTURE: Culture: >=100000 — AB

## 2020-11-19 MED ORDER — HYDRALAZINE HCL 20 MG/ML IJ SOLN: 10.0000 mg | Freq: Four times a day (QID) | INTRAMUSCULAR | Status: DC | PRN

## 2020-11-19 MED ORDER — CARVEDILOL 3.125 MG PO TABS
6.2500 mg | ORAL_TABLET | Freq: Two times a day (BID) | ORAL | Status: DC
  Administered 2020-11-19 – 2020-11-21 (×5): 6.25 mg via ORAL
  Filled 2020-11-19 (×9): qty 2

## 2020-11-19 NOTE — Plan of Care (Signed)
  Problem: Acute Rehab OT Goals (only OT should resolve) Goal: Pt. Will Perform Grooming Flowsheets (Taken 11/19/2020 1124) Pt Will Perform Grooming:  with modified independence  bed level  sitting Goal: Pt. Will Perform Upper Body Dressing Flowsheets (Taken 11/19/2020 1124) Pt Will Perform Upper Body Dressing:  with modified independence  sitting  bed level Goal: Pt. Will Perform Lower Body Dressing Flowsheets (Taken 11/19/2020 1124) Pt Will Perform Lower Body Dressing:  with min assist  with adaptive equipment  sitting/lateral leans  bed level Goal: Pt. Will Transfer To Toilet Flowsheets (Taken 11/19/2020 1124) Pt Will Transfer to Toilet:  with mod assist  with min assist  stand pivot transfer  grab bars Goal: Pt/Caregiver Will Perform Home Exercise Program Flowsheets (Taken 11/19/2020 1124) Pt/caregiver will Perform Home Exercise Program:  Increased ROM  Increased strength  Both right and left upper extremity  With Supervision  Dreya Buhrman OT, MOT

## 2020-11-19 NOTE — Progress Notes (Signed)
Patient Demographics:    Gustavus Haskin, is a 80 y.o. male, DOB - 1940/10/11, YDX:[id number]  Admit date - 11/16/2020   Admitting Physician Truett Mainland, DO  Outpatient Primary MD for the patient is Neale Burly, MD  LOS - 3   Chief Complaint  Patient presents with  . Fever  . Altered Mental Status        Subjective:    Vinson Tietze today has  no emesis,  No chest pain,    -More awake, more coherent, more interactive    Assessment  & Plan :    Principal Problem:   Severe sepsis with acute organ dysfunction and Septic Shock  due to methicillin susceptible Staphylococcus aureus (MSSA) (HCC) Active Problems:   Essential hypertension   Acute lower UTI   Controlled type 2 diabetes mellitus with hyperglycemia, without long-term current use of insulin (HCC)   Chronic kidney disease (CKD), stage III (moderate) (HCC)   HTN (hypertension)   Chronic combined systolic and diastolic CHF (congestive heart failure) (HCC)   S/P TAVR (transcatheter aortic valve replacement)   Left kidney mass   Brief Summary:- 80 y.o. 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 which was found incidentally during his last hospitalization approximately 6 weeks ago, history of severe aortic stenosis status post TAVR with history of recurrent UTIs admitted on 11/16/2020 with severe sepsis with acute metabolic encephalopathy and concerns for left renal cell carcinoma  A/p 1)Severe Staph Aureus Sepsis with Septic Shock--- POA-on admission patient had fevers, tachycardia, tachypnea,  Leukocytosis, as well as elevated lactic acid and altered mentation and hypotension (hemodynamic instability) -Weaned off  IV Levophed on 11/17/20  -Sepsis pathophysiology resolved -Lactic acidosis resolved with IV fluids -blood  cultures from 11/16/2020 with BCID 2/4 of anaerobic cx back GPC Staph Aureus MRSA Neg--- infected foot ulcers with cellulitis noted please see photos in epic -Repeat blood cultures from 11/17/2020 NGTD -switched from IV Rocephin to Ancef on 11/17/20 Discussed with Dr. Tommy Medal (Infectious Disease) -TTE demonstrates TAVR without evidence of vegetations or endocarditis -Plan is for TEE on 11/20/2020 due to staph bacteremia with recent prosthetic valve -Antimicrobials this admission: Vancomycin 4/8>>4/8 Cefepime 4/8>>4/8 Flagyl 4/8 >>4/8 Ceftriaxone 4/8>>4/9 Cefazolin 4/9>>  Microbiology results: 4/8BCx: GPC in anaerobic bottles both sets. 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--patient will need to [facility]  5)S/p TAVR--- patient had aortic valve replacement on 09/25/2020- ---echo from 11/17/2020 shows functioning aortic valve without significant concerns -May need TEE as above #1  6)Chronic diastolic dysfunction CHF--Echo from 11/17/2020 with EF of 60 to 65% and grade 1 diastolic CHF--- -given sepsis pathophysiology with hemodynamic instability patient needs IV fluids but will monitor closely for any evidence of volume overload  7)DM2-A1c 6.9 reflecting excellent diabetic control PTA Use Novolog/Humalog Sliding scale insulin  with Accu-Cheks/Fingersticks as ordered   8)Lower extremity pressure ulcers--- with superimposed cellulitis, antibiotics as above #1 -Wound care consult noted -Please see photos in epic -May need MRI to rule out osteo- -ABIs ordered  9)social/ethics--palliative care consult appreciated,  patient is a DNR  Disposition/Need for in-Hospital Stay- patient unable to be discharged at this time due to severe staph sepsis with septic shock requiring iv pressors, TEE on 11/20/2020  Status is: Inpatient  Remains inpatient appropriate because:please see above  Disposition: The patient is from: Home              Anticipated d/c is to: TBD              Anticipated d/c date is: > 3 days              Patient currently is not medically stable to d/c. Barriers: Not Clinically Stable-   Code Status :   Code Status: DNR   Family Communication:    Discussed with daughter and grand-daughter, questions answered  Consults  :  ID  DVT Prophylaxis  :   - SCDs  enoxaparin (LOVENOX) injection 40 mg Start: 11/17/20 1000 SCDs Start: 11/16/20 2232  Lab Results  Component Value Date   PLT 80 (L) 11/18/2020    Inpatient Medications  Scheduled Meds: . carvedilol  6.25 mg Oral BID WC  . Chlorhexidine Gluconate Cloth  6 each Topical Daily  . enoxaparin (LOVENOX) injection  40 mg Subcutaneous Q24H  . hydrocerin   Topical BID  . insulin aspart  0-15 Units Subcutaneous TID AC & HS  . tamsulosin  0.4 mg Oral QPC supper   Continuous Infusions: . sodium chloride 10 mL/hr at 11/19/20 0955  .  ceFAZolin (ANCEF) IV 2 g (11/19/20 1326)   PRN Meds:.hydrALAZINE, ondansetron **OR** ondansetron (ZOFRAN) IV, oxyCODONE-acetaminophen   Anti-infectives (From admission, onward)   Start     Dose/Rate Route Frequency Ordered Stop   11/17/20 1715  ceFAZolin (ANCEF) IVPB 2g/100 mL premix        2 g 200 mL/hr over 30 Minutes Intravenous Every 8 hours 11/17/20 1616     11/17/20 1200  vancomycin (VANCOREADY) IVPB 1250 mg/250 mL  Status:  Discontinued        1,250 mg 166.7 mL/hr over 90 Minutes Intravenous Every 24 hours 11/16/20 1335 11/16/20 2231   11/17/20 1145  cefTRIAXone (ROCEPHIN) 2 g in sodium chloride 0.9 % 100 mL IVPB  Status:  Discontinued        2 g 200 mL/hr over 30 Minutes Intravenous Every 24  hours 11/16/20 2234 11/17/20 1559   11/17/20 0000  ceFEPIme (MAXIPIME) 2 g in sodium chloride 0.9 % 100 mL IVPB  Status:  Discontinued        2 g 200 mL/hr over 30 Minutes Intravenous Every 12 hours 11/16/20 1254 11/16/20 2231   11/16/20 2245  cefTRIAXone (ROCEPHIN) 2 g in sodium chloride 0.9 % 100 mL IVPB  Status:  Discontinued        2 g 200 mL/hr over 30 Minutes Intravenous Every 24 hours 11/16/20 2231 11/16/20 2234   11/16/20 1130  ceFEPIme (MAXIPIME) 2 g in sodium chloride 0.9 % 100 mL IVPB        2 g 200 mL/hr over 30 Minutes Intravenous  Once 11/16/20 1116 11/16/20 1447   11/16/20 1130  metroNIDAZOLE (FLAGYL) IVPB 500 mg        500 mg 100 mL/hr over 60 Minutes Intravenous  Once 11/16/20 1116 11/16/20  1416   11/16/20 1130  vancomycin (VANCOCIN) IVPB 1000 mg/200 mL premix  Status:  Discontinued        1,000 mg 200 mL/hr over 60 Minutes Intravenous  Once 11/16/20 1116 11/16/20 1126   11/16/20 1130  vancomycin (VANCOREADY) IVPB 2000 mg/400 mL        2,000 mg 200 mL/hr over 120 Minutes Intravenous  Once 11/16/20 1126 11/16/20 1417        Objective:   Vitals:   11/19/20 1400 11/19/20 1500 11/19/20 1600 11/19/20 1611  BP: (!) 138/59 (!) 143/74 (!) 152/60   Pulse: 63 63 66   Resp: (!) 7 13 19    Temp:    97.8 F (36.6 C)  TempSrc:    Oral  SpO2: 97% 98% 97%   Weight:      Height:        Wt Readings from Last 3 Encounters:  11/19/20 114.5 kg  09/29/20 109.1 kg  07/03/20 116.1 kg     Intake/Output Summary (Last 24 hours) at 11/19/2020 1729 Last data filed at 11/19/2020 0955 Gross per 24 hour  Intake 506.38 ml  Output 950 ml  Net -443.62 ml     Physical Exam  Gen:- Awake, more awake, HEENT:- [place].AT, No sclera icterus Neck-Supple Neck,No JVD,.  Lungs-diminished breath sounds, no wheezing  CV- S1, S2 normal, regular  Abd-  +ve B.Sounds, Abd Soft, No tenderness,    Extremity/Skin:-Please see photos in epic and below Psych-affect is appropriate, oriented  x3 Neuro-generalized weakness, no new focal deficits, no tremors MSK-bilateral venous stasis dermatitis, decubitus ulcers of both heels with superimposed cellulitis and necrotic eschar -Right foot  with necrotic/gangrenous-looking toes  Media Information         Document Information  Photos  [place]   11/17/2020 11:03  Attached To:  Hospital Encounter on 11/16/20   Source Information  Roxan Hockey, MD  [facility]    Media Information         Document Information  Photos  [place]   11/17/2020 11:02  Attached To:  Hospital Encounter on 11/16/20   Source Information  Roxan Hockey, MD  [facility]      Data Review:   Micro Results Recent Results (from the past 240 hour(s))  Blood Culture (routine x 2)     Status: Abnormal   Collection Time: 11/16/20 11:34 AM   Specimen: BLOOD  Result Value Ref Range Status   Specimen Description   Final    BLOOD LEFT ANTECUBITAL Performed at [facility], [address] [place], [place] [address]    Special Requests   Final    Blood Culture adequate volume BOTTLES DRAWN AEROBIC AND ANAEROBIC Performed at [facility], [address], [place], [place] [address]    Culture  Setup Time   Final    GRAM POSITIVE COCCI ANAEROBIC BOTTLE ONLY Gram Stain Report Called to,Read Back By and Verified With: ELLER @ [id number] ON [id number] BY HENDERSON L. CRITICAL RESULT CALLED TO, READ BACK BY AND VERIFIED WITH: J,MENDENHALL PHARMD @1429  11/17/20 EB Performed at [facility], [place] [address] [place], [place] [address]    Culture STAPHYLOCOCCUS AUREUS (A)  Final   Report Status 11/19/2020 FINAL  Final   Organism ID, Bacteria STAPHYLOCOCCUS AUREUS  Final      Susceptibility   Staphylococcus aureus - MIC*    CIPROFLOXACIN >=8 RESISTANT Resistant     ERYTHROMYCIN <=0.25 SENSITIVE Sensitive     GENTAMICIN <=0.5 SENSITIVE Sensitive  OXACILLIN 0.5 SENSITIVE Sensitive     TETRACYCLINE <=1 SENSITIVE Sensitive      VANCOMYCIN <=0.5 SENSITIVE Sensitive     TRIMETH/SULFA <=10 SENSITIVE Sensitive     CLINDAMYCIN <=0.25 SENSITIVE Sensitive     RIFAMPIN <=0.5 SENSITIVE Sensitive     Inducible Clindamycin NEGATIVE Sensitive     * STAPHYLOCOCCUS AUREUS  Blood Culture ID Panel (Reflexed)     Status: Abnormal   Collection Time: 11/16/20 11:34 AM  Result Value Ref Range Status   Enterococcus faecalis NOT DETECTED NOT DETECTED Final   Enterococcus Faecium NOT DETECTED NOT DETECTED Final   Listeria monocytogenes NOT DETECTED NOT DETECTED Final   Staphylococcus species DETECTED (A) NOT DETECTED Final    Comment: CRITICAL RESULT CALLED TO, READ BACK BY AND VERIFIED WITH: J,MENDENHALL PHARMD @1429  11/17/20 EB    Staphylococcus aureus (BCID) DETECTED (A) NOT DETECTED Final    Comment: CRITICAL RESULT CALLED TO, READ BACK BY AND VERIFIED WITH: J,MENDENHALL PHARMD @1429  11/17/20 EB    Staphylococcus epidermidis NOT DETECTED NOT DETECTED Final   Staphylococcus lugdunensis NOT DETECTED NOT DETECTED Final   Streptococcus species NOT DETECTED NOT DETECTED Final   Streptococcus agalactiae NOT DETECTED NOT DETECTED Final   Streptococcus pneumoniae NOT DETECTED NOT DETECTED Final   Streptococcus pyogenes NOT DETECTED NOT DETECTED Final   A.calcoaceticus-baumannii NOT DETECTED NOT DETECTED Final   Bacteroides fragilis NOT DETECTED NOT DETECTED Final   Enterobacterales NOT DETECTED NOT DETECTED Final   Enterobacter cloacae complex NOT DETECTED NOT DETECTED Final   Escherichia coli NOT DETECTED NOT DETECTED Final   Klebsiella aerogenes NOT DETECTED NOT DETECTED Final   Klebsiella oxytoca NOT DETECTED NOT DETECTED Final   Klebsiella pneumoniae NOT DETECTED NOT DETECTED Final   Proteus species NOT DETECTED NOT DETECTED Final   Salmonella species NOT DETECTED NOT DETECTED Final   Serratia marcescens NOT DETECTED NOT DETECTED Final   Haemophilus influenzae NOT DETECTED NOT DETECTED Final   Neisseria meningitidis NOT DETECTED  NOT DETECTED Final   Pseudomonas aeruginosa NOT DETECTED NOT DETECTED Final   Stenotrophomonas maltophilia NOT DETECTED NOT DETECTED Final   Candida albicans NOT DETECTED NOT DETECTED Final   Candida auris NOT DETECTED NOT DETECTED Final   Candida glabrata NOT DETECTED NOT DETECTED Final   Candida krusei NOT DETECTED NOT DETECTED Final   Candida parapsilosis NOT DETECTED NOT DETECTED Final   Candida tropicalis NOT DETECTED NOT DETECTED Final   Cryptococcus neoformans/gattii NOT DETECTED NOT DETECTED Final   Meth resistant mecA/C and MREJ NOT DETECTED NOT DETECTED Final    Comment: Performed at [facility], [address] [place], [place] [address]  Blood Culture (routine x 2)     Status: Abnormal   Collection Time: 11/16/20 11:41 AM   Specimen: BLOOD  Result Value Ref Range Status   Specimen Description   Final    BLOOD BLOOD RIGHT ARM Performed at [facility], [address], [place], [place] [address]    Special Requests   Final    Blood Culture results may not be optimal due to an excessive volume of blood received in culture bottles BOTTLES DRAWN AEROBIC AND ANAEROBIC Performed at [facility], [address] [place], [place] [address]    Culture  Setup Time   Final    GRAM POSITIVE COCCI ANAEROBIC BOTTLE ONLY Gram Stain Report Called to,Read Back By and Verified With: ELLER @ [phone] ON [id number] BY HENDERSON L. Performed at [facility], [address] [place],  [address]  Culture (A)  Final    STAPHYLOCOCCUS AUREUS SUSCEPTIBILITIES PERFORMED ON PREVIOUS CULTURE WITHIN THE LAST 5 DAYS. Performed at [facility], [place] [address] [place], [place] [address]    Report Status 11/19/2020 FINAL  Final  Resp Panel by RT-PCR (Flu A&B, Covid) Nasopharyngeal Swab     Status: None   Collection Time: 11/16/20 11:49 AM   Specimen: Nasopharyngeal Swab; Nasopharyngeal(NP) swabs in vial transport medium  Result Value Ref Range Status   SARS Coronavirus 2 by RT PCR  NEGATIVE NEGATIVE Final    Comment: (NOTE) SARS-CoV-2 target nucleic acids are NOT DETECTED.  The SARS-CoV-2 RNA is generally detectable in upper respiratory specimens during the acute phase of infection. The lowest concentration of SARS-CoV-2 viral copies this assay can detect is 138 copies/mL. A negative result does not preclude SARS-Cov-2 infection and should not be used as the sole basis for treatment or other patient management decisions. A negative result may occur with  improper specimen collection/handling, submission of specimen other than nasopharyngeal swab, presence of viral mutation(s) within the areas targeted by this assay, and inadequate number of viral copies(<138 copies/mL). A negative result must be combined with clinical observations, patient history, and epidemiological information. The expected result is Negative.  Fact Sheet for Patients:  EntrepreneurPulse.com.au  Fact Sheet for Healthcare Providers:  IncredibleEmployment.be  This test is no t yet approved or cleared by the [place] FDA and  has been authorized for detection and/or diagnosis of SARS-CoV-2 by FDA under an Emergency Use Authorization (EUA). This EUA will remain  in effect (meaning this test can be used) for the duration of the COVID-19 declaration under Section 564(b)(1) of the Act, 21 U.S.C.section 360bbb-3(b)(1), unless the authorization is terminated  or revoked sooner.       Influenza A by PCR NEGATIVE NEGATIVE Final   Influenza B by PCR NEGATIVE NEGATIVE Final    Comment: (NOTE) The Xpert Xpress SARS-CoV-2/FLU/RSV plus assay is intended as an aid in the diagnosis of influenza from Nasopharyngeal swab specimens and should not be used as a sole basis for treatment. Nasal washings and aspirates are unacceptable for Xpert Xpress SARS-CoV-2/FLU/RSV testing.  Fact Sheet for Patients: EntrepreneurPulse.com.au  Fact Sheet for  Healthcare Providers: IncredibleEmployment.be  This test is not yet approved or cleared by the [place] FDA and has been authorized for detection and/or diagnosis of SARS-CoV-2 by FDA under an Emergency Use Authorization (EUA). This EUA will remain in effect (meaning this test can be used) for the duration of the COVID-19 declaration under Section 564(b)(1) of the Act, 21 U.S.C. section 360bbb-3(b)(1), unless the authorization is terminated or revoked.  Performed at [facility], [address], [place], [place] [address]   Urine culture     Status: Abnormal   Collection Time: 11/16/20 12:36 PM   Specimen: Urine, Catheterized  Result Value Ref Range Status   Specimen Description   Final    URINE, CATHETERIZED Performed at [facility], [address] [place], [place] [address]    Special Requests   Final    NONE Performed at [facility], [address] [place], [place] [address]    Culture >=100,000 COLONIES/mL CITROBACTER FREUNDII (A)  Final   Report Status 11/19/2020 FINAL  Final   Organism ID, Bacteria CITROBACTER FREUNDII (A)  Final      Susceptibility   Citrobacter freundii - MIC*    CEFAZOLIN >=64 RESISTANT Resistant     CEFEPIME 0.25 SENSITIVE Sensitive     CEFTRIAXONE >=64  RESISTANT Resistant     CIPROFLOXACIN <=0.25 SENSITIVE Sensitive     GENTAMICIN <=1 SENSITIVE Sensitive     IMIPENEM <=0.25 SENSITIVE Sensitive     NITROFURANTOIN <=16 SENSITIVE Sensitive     TRIMETH/SULFA <=20 SENSITIVE Sensitive     PIP/TAZO >=128 RESISTANT Resistant     * >=100,000 COLONIES/mL CITROBACTER FREUNDII  MRSA PCR Screening     Status: None   Collection Time: 11/17/20  3:57 AM   Specimen: Nasal Mucosa; Nasopharyngeal  Result Value Ref Range Status   MRSA by PCR NEGATIVE NEGATIVE Final    Comment:        The GeneXpert MRSA Assay (FDA approved for NASAL specimens only), is one component of a comprehensive MRSA colonization surveillance program. It is  not intended to diagnose MRSA infection nor to guide or monitor treatment for MRSA infections. Performed at [facility], [address], [place], [place] [address]   Culture, blood (Routine X 2) w Reflex to ID Panel     Status: None (Preliminary result)   Collection Time: 11/17/20  7:37 PM   Specimen: Left Antecubital; Blood  Result Value Ref Range Status   Specimen Description LEFT ANTECUBITAL  Final   Special Requests   Final    Blood Culture adequate volume BOTTLES DRAWN AEROBIC AND ANAEROBIC   Culture   Final    NO GROWTH 2 DAYS Performed at [facility], [address], [place], [place] [address]    Report Status PENDING  Incomplete  Culture, blood (Routine X 2) w Reflex to ID Panel     Status: None (Preliminary result)   Collection Time: 11/17/20  7:42 PM   Specimen: BLOOD RIGHT HAND  Result Value Ref Range Status   Specimen Description BLOOD RIGHT HAND  Final   Special Requests   Final    Blood Culture results may not be optimal due to an inadequate volume of blood received in culture bottles BOTTLES DRAWN AEROBIC AND ANAEROBIC   Culture   Final    NO GROWTH 2 DAYS Performed at [facility], [address], [place],  [address]    Report Status PENDING  Incomplete    Radiology Reports CT Abdomen Pelvis W Contrast  Result Date: 11/16/2020 CLINICAL DATA:  Fever and abdominal pain. History of renal cell carcinoma. EXAM: CT ABDOMEN AND PELVIS WITH CONTRAST TECHNIQUE: Multidetector CT imaging of the abdomen and pelvis was performed using the standard protocol following bolus administration of intravenous contrast. CONTRAST:  168mL OMNIPAQUE IOHEXOL 300 MG/ML  SOLN COMPARISON:  Abdominal MRI 09/22/2020.  Abdominal CT 09/20/2020 FINDINGS: Lower chest: Normal heart size post TAVR. Mild breathing motion artifact at the lung bases scattered atelectasis. No confluent consolidation or pleural effusion. Hepatobiliary: No focal liver abnormality is seen. Distended gallbladder with  layering stones/sludge. No obvious pericholecystic fat stranding inflammation, there is motion artifact through this region. No biliary dilatation. Pancreas: Fatty atrophy. No ductal dilatation or inflammation. Cystic pancreatic tail lesion on MRI is not well appreciated by CT. Spleen: Enlarged spanning approximately 15 mm cranial caudal, no focal splenic abnormality. 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 Normal right adrenal gland. There is mild right hydroureteronephrosis, with the ureter dilated to the bladder insertion. No evidence of stone or obstructing lesion. There is minimal prominence of the left ureter and renal pelvis. There is right greater than left perinephric edema. Areas of left renal cortical scarring. Enhancing lesion arising from the lower left kidney measures 2 cm. Previous right  renal cyst is grossly stable. Motion artifact obscures detailed assessment. Urinary bladder is physiologically distended, question of bladder wall thickening Stomach/Bowel: Stomach is within normal limits. Appendix is not confidently visualized, no evidence of appendicitis. No evidence of bowel wall thickening, distention, or inflammatory changes. Moderate colonic stool burden. Vascular/Lymphatic: Aortic atherosclerosis. No aortic aneurysm. Small retroperitoneal lymph nodes are not enlarged by size criteria. Reproductive: Prostatic calcifications. Other: No free air.  No intra-abdominal abscess.  No ascites. Musculoskeletal: Diffuse degenerative disc disease and facet hypertrophy in the included spine. Moderate degenerative change of both hips. No acute osseous abnormality. IMPRESSION: 1. Mild right hydroureteronephrosis, with the ureter dilated to the bladder insertion. Mild prominence of the left ureter and renal pelvis. No evidence of stone or obstructing lesion. There is right greater than left perinephric edema. Findings are suspicious for urinary tract  infection. 2. Enhancing 2 cm lesion arising from the lower left kidney, suspicious for renal cell carcinoma, unchanged in size from recent imaging. 3. Distended gallbladder with layering stones/sludge. No obvious pericholecystic fat stranding inflammation, there is motion artifact through this region. 4. Splenomegaly. 5. 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 6. Cystic pancreatic tail lesion on MRI is not well appreciated by CT. Aortic Atherosclerosis (ICD10-I70.0). Electronically Signed   By: Keith Rake M.D.   On: 11/16/2020 16:47   US ARTERIAL ABI (SCREENING LOWER EXTREMITY)  Result Date: 11/18/2020 CLINICAL DATA:  80 year old male with bilateral lower extremity wounds. EXAM: NONINVASIVE PHYSIOLOGIC VASCULAR STUDY OF BILATERAL LOWER EXTREMITIES TECHNIQUE: Evaluation of both lower extremities were performed at rest, including calculation of ankle-brachial indices with single level Doppler, pressure and pulse volume recording. COMPARISON:  None. FINDINGS: Right ABI:  1.76 Left ABI:  1.67 Right Lower Extremity: Monophasic, dampened waveforms at the level of the ankle. Left Lower Extremity: Monophasic, dampened waveforms at the level of the ankle. IMPRESSION: Nondiagnostic ankle-brachial indices secondary to noncompressible vessels. Consider lower extremity arterial duplex for further characterization and evaluation for flow-limiting stenoses. Ruthann Cancer, MD [facility] Electronically Signed   By: Ruthann Cancer MD   On: 11/18/2020 09:41   DG Chest Port 1 View  Result Date: 11/16/2020 CLINICAL DATA:  Fever with altered mental status EXAM: PORTABLE CHEST 1 VIEW COMPARISON:  September 24, 2020 FINDINGS: Lungs are clear. Heart size and pulmonary vascularity are normal. No adenopathy. Patient is status post aortic valve replacement. There is degenerative change in each shoulder. IMPRESSION: No edema or airspace opacity. Heart size  within normal limits. Status post aortic valve replacement. Electronically Signed   By: Lowella Grip III M.D.   On: 11/16/2020 11:46   ECHOCARDIOGRAM COMPLETE  Result Date: 11/17/2020    ECHOCARDIOGRAM REPORT   Patient Name:   CHRISTPOHER SIEVERS Date of Exam: 11/17/2020 Medical Rec #:  [id number]    Height:       71.0 in Accession #:    [id number]   Weight:       240.0 lb Date of Birth:  Jun 25, 1941     BSA:          2.278 m Patient Age:    28 years     BP:           128/51 mmHg Patient Gender: M            HR:           64 bpm. Exam Location:  [facility] Procedure: 2D Echo, Cardiac Doppler and Color Doppler Indications:    Fever  History:        Patient has prior history of Echocardiogram examinations, most                 recent 11/14/2020. CHF, Aortic Valve Disease, Arrythmias:RBBB;                 Risk Factors:Hypertension, Diabetes and morbid obesity. Renal                 cell carcinoma, CKD.  Sonographer:    Dustin Flock RDCS Referring Phys: [place]  1. Left ventricular ejection fraction, by estimation, is 60 to 65%. The left ventricle has normal function. The left ventricle has no regional wall motion abnormalities. There is mild concentric left ventricular hypertrophy. Left ventricular diastolic parameters are consistent with Grade I diastolic dysfunction (impaired relaxation). Elevated left atrial pressure.  2. Right ventricular systolic function is normal. The right ventricular size is normal. Tricuspid regurgitation signal is inadequate for assessing PA pressure.  3. Left atrial size was moderately dilated.  4. The mitral valve is degenerative. Mild mitral valve regurgitation. Moderate mitral annular calcification.  5. The aortic valve has been repaired/replaced. Procedure Date: 09/25/2020. A 11mm Edwards Sapien TAVR valve is present and appears well seated with normal function by Doppler interrogation. Mean gradient 66mmHg, DOI 0.44. Trivial central AI without evidence of  paravalvular leak. Comparison(s): Compared to prior TTE on 11/14/20, there is no significant change. AoV mean gradient remains at 13.3. Conclusion(s)/Recommendation(s): No evidence of valvular vegetations on this transthoracic echocardiogram. Would recommend a transesophageal echocardiogram to exclude infective endocarditis if clinically indicated. FINDINGS  Left Ventricle: Left ventricular ejection fraction, by estimation, is 60 to 65%. The left ventricle has normal function. The left ventricle has no regional wall motion abnormalities. The left ventricular internal cavity size was normal in size. There is  mild concentric left ventricular hypertrophy. Left ventricular diastolic parameters are consistent with Grade I diastolic dysfunction (impaired relaxation). Elevated left atrial pressure. Right Ventricle: The right ventricular size is normal. No increase in right ventricular wall thickness. Right ventricular systolic function is normal. Tricuspid regurgitation signal is inadequate for assessing PA pressure. Left Atrium: Left atrial size was moderately dilated. Right Atrium: Right atrial size was normal in size. Pericardium: There is no evidence of pericardial effusion. Mitral Valve: The mitral valve is degenerative in appearance. There is moderate thickening of the mitral valve leaflet(s). There is moderate calcification of the mitral valve leaflet(s). Moderate mitral annular calcification. Mild mitral valve regurgitation. Tricuspid Valve: The tricuspid valve is normal in structure. Tricuspid valve regurgitation is trivial. Aortic Valve: A 66mm Edwards Sapien TAVR valve is present and appears well seated with normal function by Doppler interrogation. Mean gradient 45mmHg, DOI 0.44. Trivial central AI without evidence of paravalvular leak. The aortic valve has been repaired/replaced. Aortic valve regurgitation is trivial. Aortic valve mean gradient measures 13.0 mmHg. Aortic valve peak gradient measures 25.2  mmHg. Aortic valve area, by VTI measures 2.17 cm. There is a 29 mm Sapien prosthetic, stented (TAVR) valve present in the aortic position. Pulmonic Valve: The pulmonic valve was normal in structure. Pulmonic valve regurgitation is not visualized. Aorta: The aortic root and ascending aorta are structurally normal, with no evidence of dilitation. IAS/Shunts: No atrial level shunt detected by color flow Doppler.  LEFT VENTRICLE PLAX 2D LVIDd:         4.78 cm  Diastology LVIDs:         2.90 cm  LV e' medial:  5.00 cm/s LV PW:         1.35 cm  LV E/e' medial:  22.2 LV IVS:        1.39 cm  LV e' lateral:   7.40 cm/s LVOT diam:     2.50 cm  LV E/e' lateral: 15.0 LV SV:         110 LV SV Index:   48 LVOT Area:     4.91 cm  RIGHT VENTRICLE RV Basal diam:  4.74 cm RV S prime:     10.60 cm/s TAPSE (M-mode): 2.4 cm LEFT ATRIUM              Index       RIGHT ATRIUM           Index LA diam:        4.30 cm  1.89 cm/m  RA Area:     17.30 cm LA Vol (A2C):   110.0 ml 48.28 ml/m RA Volume:   51.30 ml  22.52 ml/m LA Vol (A4C):   93.9 ml  41.21 ml/m LA Biplane Vol: 103.0 ml 45.21 ml/m  AORTIC VALVE AV Area (Vmax):    2.17 cm AV Area (Vmean):   2.24 cm AV Area (VTI):     2.17 cm AV Vmax:           251.00 cm/s AV Vmean:          164.000 cm/s AV VTI:            0.507 m AV Peak Grad:      25.2 mmHg AV Mean Grad:      13.0 mmHg LVOT Vmax:         111.00 cm/s LVOT Vmean:        75.000 cm/s LVOT VTI:          0.224 m LVOT/AV VTI ratio: 0.44  AORTA Ao Root diam: 2.90 cm MITRAL VALVE MV Area (PHT): 2.03 cm     SHUNTS MV Decel Time: 373 msec     Systemic VTI:  0.22 m MV E velocity: 111.00 cm/s  Systemic Diam: 2.50 cm MV A velocity: 115.00 cm/s MV E/A ratio:  0.97 Gwyndolyn Kaufman MD Electronically signed by Gwyndolyn Kaufman MD Signature Date/Time: 11/17/2020/12:03:55 PM    Final    ECHOCARDIOGRAM COMPLETE  Result Date: 11/14/2020    ECHOCARDIOGRAM REPORT   Patient Name:   MELQUIADES KOVAR Date of Exam: 11/14/2020 Medical Rec #:   [id number]    Height:       71.0 in Accession #:    [id number]   Weight:       240.5 lb Date of Birth:  [birth year]     BSA:          2.281 m Patient Age:    45 years     BP:           150/69 mmHg Patient Gender: M            HR:           71 bpm. Exam Location:  [facility] Procedure: 2D Echo, Cardiac Doppler and Color Doppler Indications:    Z95.2 (ICD-10-CM) - S/P TAVR (transcatheter aortic valve                 replacement)  History:        Patient has prior history of Echocardiogram examinations, most                 recent  09/26/2020. CHF, Arrythmias:RBBB; Risk                 Factors:Hypertension and Diabetes. Sleep Apnea. CKD. Aortic                 Valve: 29 mm Edwards Sapien prosthetic, stented (TAVR) valve is                 present in the aortic position. Procedure Date:09/25/2020.                 Morbid obesity.  Sonographer:    Alvino Chapel RCS Referring Phys: [id number] [place]  1. Left ventricular ejection fraction, by estimation, is 65 to 70%. The left ventricle has normal function. The left ventricle has no regional wall motion abnormalities. There is moderate left ventricular hypertrophy. Left ventricular diastolic parameters are consistent with Grade I diastolic dysfunction (impaired relaxation). Elevated left atrial pressure.  2. Right ventricular systolic function is normal. The right ventricular size is normal.  3. Left atrial size was mildly dilated.  4. The mitral valve is normal in structure. Trivial mitral valve regurgitation. No evidence of mitral stenosis. Moderate mitral annular calcification.  5. Edwards Sapien 3 THV size 29 mm valve is in the AV position. . The aortic valve has been repaired/replaced. Aortic valve regurgitation is not visualized. No aortic stenosis is present. FINDINGS  Left Ventricle: Left ventricular ejection fraction, by estimation, is 65 to 70%. The left ventricle has normal function. The left ventricle has no regional wall motion abnormalities.  The left ventricular internal cavity size was normal in size. There is  moderate left ventricular hypertrophy. Left ventricular diastolic parameters are consistent with Grade I diastolic dysfunction (impaired relaxation). Elevated left atrial pressure. Right Ventricle: The right ventricular size is normal. No increase in right ventricular wall thickness. Right ventricular systolic function is normal. Left Atrium: Left atrial size was mildly dilated. Right Atrium: Right atrial size was normal in size. Pericardium: There is no evidence of pericardial effusion. Mitral Valve: The mitral valve is normal in structure. Moderate mitral annular calcification. Trivial mitral valve regurgitation. No evidence of mitral valve stenosis. MV peak gradient, 7.4 mmHg. The mean mitral valve gradient is 3.0 mmHg. Tricuspid Valve: The tricuspid valve is normal in structure. Tricuspid valve regurgitation is not demonstrated. No evidence of tricuspid stenosis. Aortic Valve: Edwards Sapien 3 THV size 29 mm valve is in the AV position. The aortic valve has been repaired/replaced. Aortic valve regurgitation is not visualized. No aortic stenosis is present. Aortic valve mean gradient measures 13.3 mmHg. Aortic valve peak gradient measures 26.2 mmHg. Aortic valve area, by VTI measures 1.40 cm. Pulmonic Valve: The pulmonic valve was not well visualized. Pulmonic valve regurgitation is mild. No evidence of pulmonic stenosis. Aorta: The aortic root is normal in size and structure. Pulmonary Artery: Indeterminant PASP, inadequate TR jet. Venous: The inferior vena cava was not well visualized. IAS/Shunts: No atrial level shunt detected by color flow Doppler.  LEFT VENTRICLE PLAX 2D LVIDd:         4.80 cm  Diastology LVIDs:         3.10 cm  LV e' medial:    5.11 cm/s LV PW:         1.40 cm  LV E/e' medial:  19.2 LV IVS:        1.40 cm  LV e' lateral:   6.31 cm/s LVOT diam:     1.90 cm  LV E/e' lateral: 15.5 LV  SV:         73 LV SV Index:   32 LVOT  Area:     2.84 cm  RIGHT VENTRICLE RV S prime:     12.30 cm/s TAPSE (M-mode): 1.9 cm LEFT ATRIUM             Index       RIGHT ATRIUM           Index LA diam:        4.80 cm 2.10 cm/m  RA Area:     17.30 cm LA Vol (A2C):   97.2 ml 42.62 ml/m RA Volume:   47.90 ml  21.00 ml/m LA Vol (A4C):   62.0 ml 27.19 ml/m LA Biplane Vol: 80.2 ml 35.17 ml/m  AORTIC VALVE AV Area (Vmax):    1.27 cm AV Area (Vmean):   1.32 cm AV Area (VTI):     1.40 cm AV Vmax:           256.00 cm/s AV Vmean:          162.333 cm/s AV VTI:            0.524 m AV Peak Grad:      26.2 mmHg AV Mean Grad:      13.3 mmHg LVOT Vmax:         115.00 cm/s LVOT Vmean:        75.300 cm/s LVOT VTI:          0.259 m LVOT/AV VTI ratio: 0.49  AORTA Ao Root diam: 3.30 cm MITRAL VALVE MV Area (PHT): 1.78 cm     SHUNTS MV Area VTI:   1.26 cm     Systemic VTI:  0.26 m MV Peak grad:  7.4 mmHg     Systemic Diam: 1.90 cm MV Mean grad:  3.0 mmHg MV Vmax:       1.36 m/s MV Vmean:      71.8 cm/s MV Decel Time: 427 msec MV E velocity: 98.00 cm/s MV A velocity: 129.00 cm/s MV E/A ratio:  0.76 Carlyle Dolly MD Electronically signed by Carlyle Dolly MD Signature Date/Time: 11/14/2020/4:43:28 PM    Final      CBC Recent Labs  Lab 11/16/20 1132 11/17/20 0507 11/18/20 0608  WBC 14.0* 13.6* 7.7  HGB 14.2 11.9* 11.9*  HCT 44.6 37.0* 37.6*  PLT 123* 95* 80*  MCV 81.1 81.9 81.7  MCH 25.8* 26.3 25.9*  MCHC 31.8 32.2 31.6  RDW 18.4* 18.8* 19.0*  LYMPHSABS 0.3*  --   --   MONOABS 1.3*  --   --   EOSABS 0.0  --   --   BASOSABS 0.0  --   --     Chemistries  Recent Labs  Lab 11/16/20 1132 11/16/20 2300 11/17/20 0507 11/18/20 0608  NA 129* 130* 131* 130*  K 4.1 3.9 3.9 3.7  CL 98 102 102 101  CO2 19* 19* 21* 21*  GLUCOSE 252* 189* 174* 106*  BUN 36* 36* 37* 36*  CREATININE 1.44* 1.58* 1.55* 1.26*  CALCIUM 8.9 8.2* 8.1* 7.8*  AST 24  --   --   --   ALT 18  --   --   --   ALKPHOS 65  --   --   --   BILITOT 1.3*  --   --   --     ------------------------------------------------------------------------------------------------------------------ No results for input(s): CHOL, HDL, LDLCALC, TRIG, CHOLHDL, LDLDIRECT in the last 72 hours.  Lab Results  Component  Value Date   HGBA1C 6.9 (H) 11/16/2020   ------------------------------------------------------------------------------------------------------------------ No results for input(s): TSH, T4TOTAL, T3FREE, THYROIDAB in the last 72 hours.  Invalid input(s): FREET3 ------------------------------------------------------------------------------------------------------------------ No results for input(s): VITAMINB12, FOLATE, FERRITIN, TIBC, IRON, RETICCTPCT in the last 72 hours.  Coagulation profile Recent Labs  Lab 11/16/20 1132  INR 1.2    No results for input(s): DDIMER in the last 72 hours.  Cardiac Enzymes No results for input(s): CKMB, TROPONINI, MYOGLOBIN in the last 168 hours.  Invalid input(s): CK ------------------------------------------------------------------------------------------------------------------    Component Value Date/Time   BNP 1,561.0 (H) 09/12/2020 7494     Roxan Hockey M.D on 11/19/2020 at 5:29 PM  Go to [email or web address] - for contact info  [facility] - Office  [phone]

## 2020-11-19 NOTE — Evaluation (Signed)
Physical Therapy Evaluation Patient Details Name: Brett Williamson MRN: [id number] DOB: 12/15/1940 Today's Date: 11/19/2020   History of Present Illness  80 year old man with history of stage III chronic kidney disease diabetes mellitus hypertension obesity left kidney mass severe aortic stenosis status post TAVR 6 weeks ago with diabetic foot ulcers and necrotic lesion of left heel suspicious for deep infection and including possible osteomyelitis now admitted with sepsis on pressors with methicillin sensitive Staph aureus growing from blood cultures.  Clinical Impression  Present for PT with nurse in room giving medication. Patient refused all PT mobility, including rolling, transitions to sitting, sitting edge of bed, and standing secondary to complaints of pain or potential pain in back. Discussed importance of mobility to be able to leave the hospital, but patient continued to refuse mobility. Patient took medication from RN with [facility] elevated and verbalized and demonstrated increased wincing and pain with HOB elevation. Completed exercises detailed below, educating patient that to increase strength in extremities will help improve mobility and can decrease pain. RN notified of patient's exercises vs mobility and patient left in bed. Patient will benefit from continued physical therapy in hospital and recommended venue below to increase strength, balance, endurance for safe ADLs and gait.     Follow Up Recommendations SNF    Equipment Recommendations       Recommendations for Other Services       Precautions / Restrictions Precautions Precautions: Fall Restrictions Weight Bearing Restrictions: No Other Position/Activity Restrictions: pressure ulcers on feet, in boots      Mobility  Bed Mobility Overal bed mobility: Needs Assistance             General bed mobility comments: patient refused to complete bed mobility.    Transfers Overall transfer level: Needs assistance                General transfer comment: patient refused to complete transfers and transitions but reports needs assistance to complete  Ambulation/Gait                Stairs            Wheelchair Mobility    Modified Rankin (Stroke Patients Only)       Balance                                             Pertinent Vitals/Pain Pain Assessment: 0-10 Pain Score: 1  Pain Location: back. reports when moves it goes up to 8-9/10 even with pain meds Pain Intervention(s): RN gave pain meds during session;Limited activity within patient's tolerance    Home Living Family/patient expects to be discharged to:: Private residence Living Arrangements: Children Available Help at Discharge: Available PRN/intermittently;Family;Friend(s) Type of Home: Apartment Home Access: Level entry     Home Layout: One level Home Equipment: Grab bars - toilet;Grab bars - tub/shower;Adaptive equipment;Walker - 4 wheels;Tub bench;Walker - 2 wheels;Cane - single point;Shower seat      Prior Function Level of Independence: Needs assistance   Gait / Transfers Assistance Needed: pt reports use of rollator  ADL's / Homemaking Assistance Needed: assistance needed        Hand Dominance        Extremity/Trunk Assessment   Upper Extremity Assessment Upper Extremity Assessment: Generalized weakness    Lower Extremity Assessment Lower Extremity Assessment: Generalized weakness       Communication  Communication: No difficulties  Cognition Arousal/Alertness: Awake/alert Behavior During Therapy: WFL for tasks assessed/performed Overall Cognitive Status: Within Functional Limits for tasks assessed                                        General Comments      Exercises General Exercises - Upper Extremity Elbow Flexion: AROM;Strengthening;Both;10 reps;Supine Elbow Extension: AROM;Strengthening;Both;10 reps;Supine General Exercises - Lower  Extremity Ankle Circles/Pumps: AROM;Strengthening;Both;10 reps;Supine Quad Sets: AROM;Strengthening;Both;10 reps;Supine Gluteal Sets: AROM;Strengthening;Both;10 reps;Supine   Assessment/Plan    PT Assessment Patient needs continued PT services  PT Problem List Decreased strength;Decreased range of motion;Decreased knowledge of use of DME;Decreased activity tolerance;Decreased safety awareness;Decreased skin integrity;Decreased balance;Decreased mobility;Decreased coordination       PT Treatment Interventions DME instruction;Balance training;Gait training;Neuromuscular re-education;Functional mobility training;Patient/family education;Therapeutic activities;Therapeutic exercise    PT Goals (Current goals can be found in the Care Plan section)  Acute Rehab PT Goals Patient Stated Goal: return home PT Goal Formulation: With patient Time For Goal Achievement: 12/03/20 Potential to Achieve Goals: Fair    Frequency Min 3X/week   Barriers to discharge        Co-evaluation               AM-PAC PT "6 Clicks" Mobility  Outcome Measure Help needed turning from your back to your side while in a flat bed without using bedrails?: A Lot Help needed moving from lying on your back to sitting on the side of a flat bed without using bedrails?: A Lot Help needed moving to and from a bed to a chair (including a wheelchair)?: A Lot Help needed standing up from a chair using your arms (e.g., wheelchair or bedside chair)?: A Lot Help needed to walk in hospital room?: A Lot Help needed climbing 3-5 steps with a railing? : Total 6 Click Score: 11    End of Session   Activity Tolerance: Patient limited by pain;Other (comment) (patient refusal secondary to fear of pain) Patient left: in bed;with call bell/phone within reach Nurse Communication: Mobility status PT Visit Diagnosis: Muscle weakness (generalized) (M62.81);Pain;Unsteadiness on feet (R26.81)    Time: 1610-9604 PT Time Calculation  (min) (ACUTE ONLY): 12 min   Charges:   PT Evaluation $PT Eval Low Complexity: 1 Low          9:14 AM,11/19/20 Domenic Moras, PT, DPT Physical Therapist at [facility]

## 2020-11-19 NOTE — Progress Notes (Signed)
Patient verbalized understanding and agreement for procedure. Consent obtained for TEE on 4/12 at 0930 with Dr. Harl Bowie.

## 2020-11-19 NOTE — Evaluation (Signed)
Occupational Therapy Evaluation Patient Details Name: Brett Williamson MRN: [id number] DOB: [birth year] Today's Date: 11/19/2020    History of Present Illness 80 year old man with history of stage III chronic kidney disease diabetes mellitus hypertension obesity left kidney mass severe aortic stenosis status post TAVR 6 weeks ago with diabetic foot ulcers and necrotic lesion of left heel suspicious for deep infection and including possible osteomyelitis now admitted with sepsis on pressors with methicillin sensitive Staph aureus growing from blood cultures.   Clinical Impression   Pt refused to leave supine position in bed for OT evaluation this date. Pt able to provide history and demonstrate A/ROM of B UE and tolerate P/ROM of B shoulders. Pt demonstrates limited A/ROM in R shoulder at ~90* but increased to ~110* for P/ROM. L shoulder appears more limited at ~90* for A/ROM and P/ROM. 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  Pt reports mild assist needed for bathing and dressing. Pt will benefit from continued OT in the hospital and recommended venue below to increase strength, balance, ROM, and endurance for safe ADL's.     Follow Up Recommendations  SNF;Supervision - Intermittent    Equipment Recommendations  None recommended by OT           Precautions / Restrictions Precautions Precautions: Fall Precaution Comments: History of L rotator cuff repair; R rotator cuff tear w/o repair. Restrictions Weight Bearing Restrictions: No Other Position/Activity Restrictions: pressure ulcers on feet, in boots      Mobility Bed Mobility Overal bed mobility: Needs Assistance             General bed mobility comments: patient refused to complete bed mobility.    Transfers Overall transfer level: Needs assistance               General transfer comment: patient refused to complete transfers and transitions but reports needs  assistance to complete (per document review)    Balance Overall balance assessment:  (Not assessed due pt refusal to leave bed.)                                         ADL either performed or assessed with clinical judgement   ADL Overall ADL's : Needs assistance/impaired                                       General ADL Comments: Pt refused to participate in bedside ADL's due to back pain. Clinical judgement indicates assist needed due to pain limitations.     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 LUE Deficits / Details: L shoulder flexion limited A/ROM ~90*; P/ROM ~90* as well. Reported past L rotator cuff repair.   Lower Extremity Assessment Lower Extremity Assessment: Defer to PT evaluation  Communication Communication Communication: No difficulties   Cognition Arousal/Alertness: Awake/alert Behavior During Therapy: WFL for tasks assessed/performed Overall Cognitive Status: Within Functional Limits for tasks assessed                                     General Comments       Exercises General Exercises - Upper Extremity Elbow Flexion: AROM;Strengthening;Both;10 reps;Supine Elbow Extension: AROM;Strengthening;Both;10 reps;Supine General Exercises - Lower Extremity Ankle Circles/Pumps: AROM;Strengthening;Both;10 reps;Supine Quad Sets: AROM;Strengthening;Both;10 reps;Supine Gluteal Sets:  AROM;Strengthening;Both;10 reps;Supine   Shoulder Instructions      Home Living Family/patient expects to be discharged to:: Private residence Living Arrangements: Children Available Help at Discharge: Available PRN/intermittently;Family;Friend(s) Type of Home: Apartment Home Access: Level entry     Home Layout: One level     Bathroom Shower/Tub: Teacher, early years/pre: Handicapped height Bathroom Accessibility: Yes   Home Equipment: Grab bars - toilet;Grab bars - tub/shower;Adaptive equipment;Walker - 4 wheels;Tub bench;Walker - 2 wheels;Cane - single point;Shower seat          Prior Functioning/Environment Level of Independence: Needs assistance  Gait / Transfers Assistance Needed: pt reports use of rollator ADL's / Homemaking Assistance Needed: assistance needed            OT Problem List: Decreased strength;Decreased range of motion;Decreased activity tolerance;Impaired balance (sitting and/or standing);Pain;Impaired UE functional use      OT Treatment/Interventions: Self-care/ADL training;Therapeutic exercise;Energy conservation;Therapeutic activities;Patient/family education;Balance training    OT Goals(Current goals can be found in the care plan section) Acute Rehab OT Goals Patient Stated Goal: return home OT Goal Formulation: With patient Time For Goal Achievement: 12/03/20 Potential to Achieve Goals: Fair  OT Frequency: Min 2X/week    End of Session Nurse Communication: Other (comment) (Nurse informed pt refused to leave bed due to pain.)  Activity Tolerance: Patient limited by pain Patient left: in bed;with call bell/phone within reach  OT Visit Diagnosis: Unsteadiness on feet (R26.81);Other abnormalities of gait and mobility (R26.89);Muscle weakness (generalized) (M62.81);Pain Pain - part of body:  (low back)                Time: 7616-0737 OT Time Calculation (min): 10 min Charges:  OT General Charges $OT Visit: 1 Visit OT  Evaluation $OT Eval Moderate Complexity: 1 Mod  Mahala Rommel OT, MOT   Larey Seat 11/19/2020, 10:21 AM

## 2020-11-19 NOTE — Progress Notes (Signed)
    [facility] has been requested to perform a transesophageal echocardiogram on Brett Williamson for SBE.  After careful review of history and examination, the risks and benefits of transesophageal echocardiogram have been explained including risks of esophageal damage, perforation (1:10,000 risk), bleeding, pharyngeal hematoma as well as other potential complications associated with conscious sedation including aspiration, arrhythmia, respiratory failure and death. Alternatives to treatment were discussed, questions were answered. Patient is willing to proceed.  We will work on getting this scheduled for 4/12 w/ Dr. Harl Bowie.  Murray Hodgkins, NP  11/19/2020 10:16 AM

## 2020-11-19 NOTE — Anesthesia Preprocedure Evaluation (Addendum)
Anesthesia Evaluation  Patient identified by MRN, date of birth, ID band Patient awake    Reviewed: Allergy & Precautions, NPO status , Patient's Chart, lab work & pertinent test results  Airway Mallampati: II  TM Distance: >3 FB Neck ROM: Full    Dental  (+) Upper Dentures, Edentulous Lower   Pulmonary    Pulmonary exam normal breath sounds clear to auscultation       Cardiovascular Exercise Tolerance: Good hypertension, Pt. on medications +CHF  Normal cardiovascular exam+ dysrhythmias + Valvular Problems/Murmurs (TAVR 09/25/20) AS  Rhythm:Regular Rate:Normal     Neuro/Psych Craniotomy for subdural hematoma CVA (Craniotomy for subdural hematoma)    GI/Hepatic negative GI ROS, Neg liver ROS,   Endo/Other  diabetes, Well Controlled, Type 2  Renal/GU Renal InsufficiencyRenal disease     Musculoskeletal negative musculoskeletal ROS (+)   Abdominal   Peds  Hematology negative hematology ROS (+)   Anesthesia Other Findings Sepsis   Reproductive/Obstetrics negative OB ROS                             Anesthesia Physical Anesthesia Plan  ASA: III  Anesthesia Plan: General   Post-op Pain Management:    Induction: Intravenous  PONV Risk Score and Plan: Propofol infusion  Airway Management Planned: Nasal Cannula and Natural Airway  Additional Equipment:   Intra-op Plan:   Post-operative Plan:   Informed Consent: I have reviewed the patients History and Physical, chart, labs and discussed the procedure including the risks, benefits and alternatives for the proposed anesthesia with the patient or authorized representative who has indicated his/her understanding and acceptance.    Discussed DNR with patient and Suspend DNR.     Plan Discussed with: CRNA and Surgeon  Anesthesia Plan Comments:        Anesthesia Quick Evaluation

## 2020-11-19 NOTE — TOC Initial Note (Signed)
Transition of Care [facility]) - Initial/Assessment Note    Patient Details  Name: Brett Williamson MRN: [id number] Date of Birth: 19-Jan-1941  Transition of Care [facility]) CM/SW Contact:    Shade Flood, LCSW Phone Number: 11/19/2020, 1:51 PM  Clinical Narrative:                  Pt admitted from home. PT/OT recommending SNF. Reviewed pt's record. Pt sleeping at the time of TOC attempted visit. Called pt's daughter to assess. Daughter states that she has been staying with pt in his apartment the last couple months. Pt was at [facility] for [facility] days end of February. Pt only has Medicare A and no supplement so he would be in co-pay days if he goes to SNF again. Daughter states that he cannot afford this. Dtr states that Reyne Dumas at [place] is working on [employer] application for pt. If the Medicaid comes through, dtr states they would like [facility], [facility], or return to [facility] for SNF rehab.   Message left for Reyne Dumas at DSS to inquire on Medicaid application status.  TOC will follow.  Expected Discharge Plan: [place] Barriers to Discharge: Continued Medical Work up   Patient Goals and CMS Choice        Expected Discharge Plan and Services Expected Discharge Plan: [place] In-house Referral: Clinical Social Work     Living arrangements for the past 2 months: Apartment                                      Prior Living Arrangements/Services Living arrangements for the past 2 months: Apartment Lives with:: Adult Children Patient language and need for interpreter reviewed:: Yes Do you feel safe going back to the place where you live?: Yes      Need for Family Participation in Patient Care: Yes (Comment) Care giver support system in place?: Yes (comment) Current home services: DME Criminal Activity/Legal Involvement Pertinent to Current Situation/Hospitalization: No - Comment as needed  Activities of Daily Living Home  Assistive Devices/Equipment: None ADL Screening (condition at time of admission) Patient's cognitive ability adequate to safely complete daily activities?: No Is the patient deaf or have difficulty hearing?: No Does the patient have difficulty seeing, even when wearing glasses/contacts?: No Does the patient have difficulty concentrating, remembering, or making decisions?: Yes Patient able to express need for assistance with ADLs?: Yes Does the patient have difficulty dressing or bathing?: Yes Independently performs ADLs?: No Communication: Independent Dressing (OT): Dependent Is this a change from baseline?: Change from baseline, expected to last >3 days Grooming: Dependent Is this a change from baseline?: Change from baseline, expected to last >3 days Feeding: Needs assistance Is this a change from baseline?: Change from baseline, expected to last >3 days Bathing: Dependent Is this a change from baseline?: Change from baseline, expected to last >3 days Toileting: Dependent Is this a change from baseline?: Change from baseline, expected to last >3days In/Out Bed: Dependent Is this a change from baseline?: Change from baseline, expected to last >3 days Walks in Home: Dependent Is this a change from baseline?: Change from baseline, expected to last >3 days Does the patient have difficulty walking or climbing stairs?: Yes Weakness of Legs: Both Weakness of Arms/Hands: Both  Permission Sought/Granted  Emotional Assessment       Orientation: : Oriented to Self,Oriented to Place,Oriented to  Time,Oriented to Situation Alcohol / Substance Use: Not Applicable Psych Involvement: No (comment)  Admission diagnosis:  Acute cystitis without hematuria [N30.00] Sepsis ([place]) [A41.9] Sepsis, due to unspecified organism, unspecified whether acute organ dysfunction present [facility]) [A41.9] Patient Active Problem List   Diagnosis Date Noted  . Severe sepsis with acute organ  dysfunction and Septic Shock  due to methicillin susceptible Staphylococcus aureus (MSSA) ([place]) 11/17/2020  . Sepsis ([place]) 11/16/2020  . Left kidney mass 11/16/2020  . 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 Chronic kidney disease (CKD), stage III (moderate) (HCC)   . Severe aortic stenosis   . HTN (hypertension)   . Diabetes ([place])   . Chronic combined systolic and diastolic CHF (congestive heart failure) ([place])   . Morbid obesity ([place])   . RBBB   . Acute on chronic diastolic CHF (congestive heart failure) ([place]) 09/11/2020  . Elevated troponin 09/11/2020  . Physical deconditioning 09/11/2020  . Candidiasis of skin 09/11/2020  . Acute cystitis 09/11/2020  . 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 Chronic renal failure, stage 3b ([place]) 09/11/2020  . AKI (acute kidney injury) ([place])   . History of hypertension   . Seizure prophylaxis   . Controlled type 2 diabetes mellitus with hyperglycemia, without long-term current use of insulin ([place])   . Traumatic subdural hematoma ([place]) 06/25/2020  . Pressure injury of skin 06/21/2020  . History of subdural hematoma 06/19/2020  . Generalized weakness 06/19/2020  . Hyponatremia 06/19/2020  . Essential hypertension 06/19/2020  . Subdural bleeding ([place]) 06/19/2020  . Acute lower UTI   . Subdural hematoma ([place]) 06/2020   PCP:  Neale Burly, MD Pharmacy:   [place] [place] - [address] AT [address] [place] [address] Phone: [phone] Fax: [phone]     Social Determinants of Health (SDOH) Interventions    Readmission Risk Interventions Readmission Risk Prevention Plan 11/19/2020  Transportation Screening Complete  HRI or [place] Complete  Social Work Consult for [place] Planning/Counseling Complete  Medication Review Press photographer) Complete

## 2020-11-19 NOTE — Consult Note (Signed)
Consultation Note Date: 11/19/2020   Patient Name: Brett Williamson  DOB: Feb 24, 1941  MRN: [id number]  Age / Sex: 80 y.o., male  PCP: Brett Burly, Williamson Referring Physician: Roxan Hockey, Williamson  Reason for Consultation: Establishing goals of care  HPI/Patient Profile: 80 y.o. 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 Concern for lower extremity pressure ulcers with cellulitis undergoing further work up. TEE planned for 11/20/20 to rule out endocarditis.   Clinical Assessment and Goals of Care: I met today at Brett Williamson's bedside. No family but his girlfriend came to bedside at the very end of our conversation. Brett Williamson has good understanding of his health situation. He understands plan for TEE tomorrow. He reviews his multiple health issues in recent history with fall and brain injury and surgery, TAVR heart procedure, and now with multiple infection burden. I reviewed current situation with him. We also discussed if he had considered his wishes given his multiple health issues. He tells me that he has spent time thinking about this and has spoken with his family about this but very vaguely. I discussed with him code status and he tells me that he would not want to be intubated or resuscitated - "I am 80 years old - what would be the point?" Good understanding that his functional status and quality of life is suffering and he would have very limited benefit from resuscitation attempts.   I discussed both with Brett Williamson and his daughter, Brett Williamson, independently and they both report  poor functional status. Brett Williamson clarifies that he does utilize Photographer to get into bathroom (short distance) and uses wheelchair to get to dining room and other parts of home farther away. Appetite is good and he feeds himself and he can fix meal like sandwich or microwave food if needed. Brett Williamson shares that he is mostly never alone at home except when she runs to the grocery store of somewhere close and quick. Brett Williamson also reports incontinence of bladder and stool.   I spoke with Brett Williamson who clarifies functional status above. She shares that code status was addressed on a past hospitalization but he did not want to make a decision at that time saying he wanted her to make that decision (she does not want to make this decision for him). Brett Williamson supports his decision for DNR and is happy he is able to make this decision himself. Brett Williamson also shares concern with his renal mass and concern for what is going on in his liver and pancreas. Brett Williamson reports that they desire work up but Brett Williamson has expressed that he may not want any cancer treatment.   All questions/concerns addressed. Emotional support provided.   Primary Decision Maker PATIENT  SUMMARY OF RECOMMENDATIONS   - DNR decided - Continue treatment of infections - Will need further cancer work up  Code Status/Advance Care Planning:  DNR   Symptom Management:   Per attending, cardiology.  Low left back deep achy pain and radiation across abd with movement: Good relief from prn Percocet.   Palliative Prophylaxis:   Aspiration, Bowel Regimen, Delirium Protocol, Frequent Pain Assessment and Turn Reposition  Prognosis:   Unable to determine  Discharge Planning: Desire to return home.      Primary Diagnoses: Present on Admission: . Chronic combined systolic and diastolic CHF (congestive heart failure) ([place]) . Chronic kidney disease (CKD), stage III (moderate) (HCC) . Controlled type 2 diabetes  mellitus with hyperglycemia, without long-term current use of insulin ([place]) . HTN (hypertension) . Essential hypertension . Left kidney mass . Severe sepsis with acute organ dysfunction and Septic Shock  due to methicillin susceptible Staphylococcus aureus (MSSA) ([place])   I have reviewed the medical record, interviewed the patient and family, and examined the patient. The following aspects are pertinent.  Past Medical History:  Diagnosis Date  . Chronic combined systolic and diastolic CHF (congestive heart failure) ([place])   . Chronic kidney disease (CKD), stage III (moderate) (HCC)   . Diabetes ([place])   . Frequent falls   . HTN (hypertension)   . Morbid obesity ([place])   . RBBB   . 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 Burt Williamson and Dr. Roxy Williamson  . Severe aortic stenosis   . Subdural hematoma ([place]) 06/2020   after a mechanical fall   Social History   Socioeconomic History  . Marital status: Widowed    Spouse name: Not on file  . Number of children: Not on file  . Years of education: Not on file  . Highest education level: Not on file  Occupational History  . Not on file  Tobacco Use  . Smoking status: Never Smoker  . Smokeless tobacco: Never Used  Vaping Use  . Vaping Use: Never used  Substance and Sexual Activity  . Alcohol use: Never  . Drug use: Never  . Sexual activity: Not Currently  Other Topics Concern  . Not on file  Social History Narrative  . Not on file   Social Determinants of Health   Financial Resource Strain: Not on file  Food Insecurity: Not on file  Transportation Needs: Not on file  Physical Activity: Not on file  Stress: Not on file  Social Connections: Not on file   Family History  Problem Relation Age of Onset  . Heart attack Father    Scheduled Meds: . carvedilol  6.25 mg Oral BID WC  .  Chlorhexidine Gluconate Cloth  6 each Topical Daily  . enoxaparin (LOVENOX) injection  40 mg Subcutaneous Q24H  . hydrocerin   Topical BID  . insulin aspart  0-15 Units Subcutaneous TID AC & HS  . tamsulosin  0.4 mg Oral QPC supper   Continuous Infusions: . sodium chloride 10 mL/hr at 11/19/20 0955  .  ceFAZolin (ANCEF) IV Stopped (11/19/20 0530)   PRN Meds:.hydrALAZINE, ondansetron **OR** ondansetron (ZOFRAN) IV, oxyCODONE-acetaminophen No Known Allergies Review of Systems  Physical Exam Vitals and nursing note reviewed.  Constitutional:      General: He is not in acute distress.    Appearance: He  is ill-appearing.  Cardiovascular:     Rate and Rhythm: Normal rate.  Pulmonary:     Effort: No tachypnea, accessory muscle usage or respiratory distress.  Abdominal:     Palpations: Abdomen is soft.  Neurological:     Mental Status: He is alert and oriented to person, place, and time.     Vital Signs: BP (!) 127/49   Pulse 66   Temp 97.7 F (36.5 C) (Oral)   Resp 10   Ht 5' 11"  (1.803 m)   Wt 114.5 kg   SpO2 99%   BMI 35.21 kg/m  Pain Scale: 0-10 POSS *See Group Information*: 1-Acceptable,Awake and alert Pain Score: 1    SpO2: SpO2: 99 % O2 Device:SpO2: 99 % O2 Flow Rate: .O2 Flow Rate (L/min): 2 L/min  IO: Intake/output summary:   Intake/Output Summary (Last 24 hours) at 11/19/2020 1206 Last data filed at 11/19/2020 0955 Gross per 24 hour  Intake 506.38 ml  Output 950 ml  Net -443.62 ml    LBM: Last BM Date: 11/17/20 Baseline Weight: Weight: 108.9 kg Most recent weight: Weight: 114.5 kg     Palliative Assessment/Data:     Time In: 1400 Time Out: 1515 Time Total: 75 min Greater than 50%  of this time was spent counseling and coordinating care related to the above assessment and plan.  Signed by: Vinie Sill, NP Palliative Medicine Team Pager # [phone] (M-F 8a-5p) Team Phone # [phone] (Nights/Weekends)

## 2020-11-19 NOTE — Plan of Care (Signed)
  Problem: Acute Rehab PT Goals(only PT should resolve) Goal: Pt will Roll Supine to Side 11/19/2020 0916 by Suzzanne Cloud, PT Outcome: Progressing 11/19/2020 0916 by Suzzanne Cloud, PT Outcome: Progressing Flowsheets (Taken 11/19/2020 0916) Pt will Roll Supine to Side: with min assist 11/19/2020 0916 by Suzzanne Cloud, PT Outcome: Progressing Goal: Pt Will Go Supine/Side To Sit 11/19/2020 0916 by Suzzanne Cloud, PT Outcome: Progressing Flowsheets (Taken 11/19/2020 0916) Pt will go Supine/Side to Sit: with moderate assist 11/19/2020 0916 by Suzzanne Cloud, PT Outcome: Progressing 11/19/2020 0916 by Suzzanne Cloud, PT Outcome: Progressing Goal: Patient Will Transfer Sit To/From Stand 11/19/2020 0916 by Suzzanne Cloud, PT Outcome: Progressing Flowsheets (Taken 11/19/2020 [phone]) Patient will transfer sit to/from stand: with min guard assist 11/19/2020 0916 by Suzzanne Cloud, PT Outcome: Progressing 11/19/2020 0916 by Suzzanne Cloud, PT Outcome: Progressing Goal: Pt Will Transfer Bed To Chair/Chair To Bed 11/19/2020 0916 by Suzzanne Cloud, PT Outcome: Progressing Flowsheets (Taken 11/19/2020 0916) Pt will Transfer Bed to Chair/Chair to Bed: with min assist 11/19/2020 0916 by Suzzanne Cloud, PT Outcome: Progressing 11/19/2020 0916 by Suzzanne Cloud, PT Outcome: Progressing Goal: Pt Will Ambulate 11/19/2020 0916 by Suzzanne Cloud, PT Outcome: Progressing Flowsheets (Taken 11/19/2020 0916) Pt will Ambulate:  15 feet  with least restrictive assistive device  with minimal assist 11/19/2020 0916 by Suzzanne Cloud, PT Outcome: Progressing 11/19/2020 0916 by Suzzanne Cloud, PT Outcome: Progressing  9:17 AM,11/19/20 Domenic Moras, PT, DPT Physical Therapist at [facility]

## 2020-11-20 ENCOUNTER — Inpatient Hospital Stay (HOSPITAL_COMMUNITY): Payer: Medicare Other

## 2020-11-20 ENCOUNTER — Encounter (HOSPITAL_COMMUNITY): Payer: Self-pay | Admitting: Family Medicine

## 2020-11-20 ENCOUNTER — Inpatient Hospital Stay (HOSPITAL_COMMUNITY): Payer: Medicare Other | Admitting: Anesthesiology

## 2020-11-20 ENCOUNTER — Encounter (HOSPITAL_COMMUNITY)
Admission: EM | Disposition: A | Discharge status: Home Health | Payer: Self-pay | Source: Home / Self Care | Attending: Family Medicine

## 2020-11-20 DIAGNOSIS — R7881 Bacteremia: Secondary | ICD-10-CM

## 2020-11-20 DIAGNOSIS — M545 Low back pain, unspecified: Secondary | ICD-10-CM

## 2020-11-20 HISTORY — PX: TEE WITHOUT CARDIOVERSION: SHX5443

## 2020-11-20 LAB — CBC
HCT: 36 % — ABNORMAL LOW (ref 39.0–52.0)
Hemoglobin: 11.5 g/dL — ABNORMAL LOW (ref 13.0–17.0)
MCH: 26.1 pg (ref 26.0–34.0)
MCHC: 31.9 g/dL (ref 30.0–36.0)
MCV: 81.8 fL (ref 80.0–100.0)
Platelets: 112 10*3/uL — ABNORMAL LOW (ref 150–400)
RBC: 4.4 MIL/uL (ref 4.22–5.81)
RDW: 18.6 % — ABNORMAL HIGH (ref 11.5–15.5)
WBC: 12 10*3/uL — ABNORMAL HIGH (ref 4.0–10.5)
nRBC: 0 % (ref 0.0–0.2)

## 2020-11-20 LAB — GLUCOSE, CAPILLARY
Glucose-Capillary: 143 mg/dL — ABNORMAL HIGH (ref 70–99)
Glucose-Capillary: 101 mg/dL — ABNORMAL HIGH (ref 70–99)
Glucose-Capillary: 133 mg/dL — ABNORMAL HIGH (ref 70–99)
Glucose-Capillary: 132 mg/dL — ABNORMAL HIGH (ref 70–99)
Glucose-Capillary: 169 mg/dL — ABNORMAL HIGH (ref 70–99)

## 2020-11-20 LAB — RENAL FUNCTION PANEL
Albumin: 2.5 g/dL — ABNORMAL LOW (ref 3.5–5.0)
Anion gap: 9 (ref 5–15)
BUN: 29 mg/dL — ABNORMAL HIGH (ref 8–23)
CO2: 24 mmol/L (ref 22–32)
Calcium: 8 mg/dL — ABNORMAL LOW (ref 8.9–10.3)
Chloride: 98 mmol/L (ref 98–111)
Creatinine, Ser: 1.18 mg/dL (ref 0.61–1.24)
GFR, Estimated: >60 mL/min (ref >60)
Glucose, Bld: 131 mg/dL — ABNORMAL HIGH (ref 70–99)
Phosphorus: 2.5 mg/dL (ref 2.5–4.6)
Potassium: 3.5 mmol/L (ref 3.5–5.1)
Sodium: 131 mmol/L — ABNORMAL LOW (ref 135–145)

## 2020-11-20 SURGERY — ECHOCARDIOGRAM, TRANSESOPHAGEAL
Anesthesia: General

## 2020-11-20 MED ORDER — LIDOCAINE VISCOUS HCL 2 % MT SOLN
OROMUCOSAL | Status: AC
  Administered 2020-11-20: 15 mL via OROMUCOSAL
  Filled 2020-11-20: qty 15

## 2020-11-20 MED ORDER — SENNA 8.6 MG PO TABS
1.0000 | ORAL_TABLET | Freq: Every day | ORAL | Status: DC
  Administered 2020-11-20: 8.6 mg via ORAL
  Filled 2020-11-20: qty 1

## 2020-11-20 MED ORDER — PROPOFOL 10 MG/ML IV BOLUS
INTRAVENOUS | Status: DC | PRN
  Administered 2020-11-20: 30 mg via INTRAVENOUS
  Administered 2020-11-20 (×2): 20 mg via INTRAVENOUS
  Administered 2020-11-20: 50 mg via INTRAVENOUS

## 2020-11-20 MED ORDER — ENOXAPARIN SODIUM 60 MG/0.6ML ~~LOC~~ SOLN
55.0000 mg | SUBCUTANEOUS | Status: DC
  Administered 2020-11-20 – 2020-11-26 (×7): 55 mg via SUBCUTANEOUS
  Filled 2020-11-20 (×7): qty 0.6

## 2020-11-20 MED ORDER — OXYCODONE-ACETAMINOPHEN 5-325 MG PO TABS
1.0000 | ORAL_TABLET | ORAL | Status: DC | PRN
  Administered 2020-11-20 – 2020-11-26 (×18): 1 via ORAL
  Filled 2020-11-20 (×19): qty 1

## 2020-11-20 MED ORDER — LACTATED RINGERS IV SOLN: INTRAVENOUS | Status: DC | PRN

## 2020-11-20 MED ORDER — SODIUM CHLORIDE FLUSH 0.9 % IV SOLN
INTRAVENOUS | Status: AC
  Filled 2020-11-20: qty 10

## 2020-11-20 MED ORDER — SENNOSIDES-DOCUSATE SODIUM 8.6-50 MG PO TABS
2.0000 | ORAL_TABLET | Freq: Every day | ORAL | Status: DC
  Administered 2020-11-20 – 2020-11-25 (×6): 2 via ORAL
  Filled 2020-11-20 (×6): qty 2

## 2020-11-20 MED ORDER — METHOCARBAMOL 500 MG PO TABS
500.0000 mg | ORAL_TABLET | Freq: Three times a day (TID) | ORAL | Status: DC
  Administered 2020-11-20 – 2020-11-26 (×18): 500 mg via ORAL
  Filled 2020-11-20 (×17): qty 1

## 2020-11-20 MED ORDER — LIDOCAINE VISCOUS HCL 2 % MT SOLN
OROMUCOSAL | Status: AC
  Filled 2020-11-20: qty 15

## 2020-11-20 MED ORDER — LIDOCAINE HCL (CARDIAC) PF 100 MG/5ML IV SOSY
PREFILLED_SYRINGE | INTRAVENOUS | Status: DC | PRN
  Administered 2020-11-20: 60 mg via INTRATRACHEAL

## 2020-11-20 NOTE — Progress Notes (Signed)
      INFECTIOUS DISEASE ATTENDING ADDENDUM:   Date: 11/20/2020  Patient name: Brett Williamson  Medical record number: [id number]  Date of birth: Jul 26, 1941   80 year old man with history of stage III chronic kidney disease diabetes mellitus hypertension obesity left kidney mass severe aortic stenosis status post TAVR 6 weeks ago with diabetic foot ulcers and necrotic lesion of left heel suspicious for deep infection and including possible osteomyelitis now admitted with sepsis on pressors with methicillin sensitive Staph aureus growing from blood cultures.  We have narrowed to cefazolin  Ideally it would be best to be able to get a transesophageal echocardiogram to rule in or rule out prosthetic valve endocarditis.  She has venous stasis changes but more concerning on necrotic lesion in his left heel which worries me about potential underlying deep infection including osteomyelitis.  TEE negative which is reassuring and we will not have to add gentamicin.  Might consider rifampin after blood cultures finalize if no growth from blood and no forms of drug drug interaction.  I do think we will need an MRI of his left heel to evaluate there is osteomyelitis present     [place] 11/20/2020, 4:00 PM

## 2020-11-20 NOTE — Progress Notes (Signed)
Palliative:  HPI: 80 y.o. 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 Concern for lower extremity pressure ulcers with cellulitis undergoing further work up. TEE planned for 11/20/20 to rule out endocarditis.   I met today with Mr. Marcussen. He has just returned from TEE and negative for endocarditis which he is happy to hear. His friend is at bedside. He is having worse pain in L low back/flank. This is worsened by activity and movement to and from TEE has made this pain worse for him now. He has had good relief from oxycodone so far but he reports this does not last him long enough.   I discussed with Mr. Dowdy as well concern for need for weeks of antibiotics and if needing IV antibiotics this would be difficult to provide at home. Also concern as his daughter (main caregiver) is out of town until Saturday evening. I encouraged Mr. Bednarczyk to consider short stay at SNF until his caregiver situation is improved. He tells me that he is not sure that he is willing to do this and I encourage him to just think about it for now.   I did attempt to reach daughter but unsuccessful but I did leave her voicemail letting her know TEE went well and that her father is stable.   All questions/concerns addressed. Emotional support provided. Discussed with Dr. Denton Brick new low back/L flank pain that is consistent but new.   Exam: Alert, oriented. Grimacing with pain. Breathing regular, unlabored. VSS. Abd large but soft. LLE wrapped and clean, dry, intact.   Plan: - DNR previously established.  - Increased oxycodone to every 4 hours as needed.  - Added senokot daily to ensure  constipation is not adding to pain. LBM questionable as patient report, RN report, and documented LBM are all different.   Rosser, NP Palliative Medicine Team Pager [phone] (Please see [email or web address] for schedule) Team Phone [phone]    Greater than 50%  of this time was spent counseling and coordinating care related to the above assessment and plan

## 2020-11-20 NOTE — Anesthesia Postprocedure Evaluation (Signed)
Anesthesia Post Note  Patient: Brett Williamson  Procedure(s) Performed: TRANSESOPHAGEAL ECHOCARDIOGRAM (TEE) WITH PROPOFOL (N/A )  Patient location during evaluation: PACU Anesthesia Type: General Level of consciousness: awake and alert and oriented Pain management: pain level controlled Vital Signs Assessment: post-procedure vital signs reviewed and stable Respiratory status: spontaneous breathing and respiratory function stable Cardiovascular status: blood pressure returned to baseline and stable Postop Assessment: no apparent nausea or vomiting Anesthetic complications: no   No complications documented.   Last Vitals:  Vitals:   11/20/20 1109 11/20/20 1117  BP: (!) 167/65   Pulse: (!) 57   Resp: 14   Temp:  36.8 C  SpO2: 100%     Last Pain:  Vitals:   11/20/20 1117  TempSrc: Oral  PainSc: 6                  Maliha Outten C Chez Bulnes

## 2020-11-20 NOTE — CV Procedure (Signed)
CV Procedure Note  Procedure: TEE Physician: Dr Carlyle Dolly MD Indication: Bacteremia, prosthetic heart valve   Patient was brought to the procedure suite after appropriate consent was obtained. The posterior oropharhynx was anesthesized with 2% viscous lidocaine. He was placed in the lateral decubitus position and bite lock placed. Sedation achieved with the assistance of anesthesiology, for details please see there note. TEE probe was intubated into the esophagus and several views obtained. Cardiopulmonary monitoring was performed throughout the procedure, he tolerated well without complications   No evidence of endocarditis. Normal functioning prosthetic aortic valve   Carlyle Dolly MD

## 2020-11-20 NOTE — H&P (Signed)
Patient has been referred for TEE in the setting of MSSA bacteremia with an artificial aortic valve. Will plan for TEE today with asssitance from anesthesiology. For full details of his medical history please see the references H&P below  Carlyle Dolly MD     Referring physician: Dr Rogene Houston, ED physician PCP: Neale Burly, MD  Outpatient Specialists:   Patient Coming From: home  Chief Complaint: fever, confusion  HPI: Brett Williamson is a 80 y.o. 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 which was found incidentally during his last hospitalization approximately 6 weeks ago, history of severe aortic stenosis status post TAVR approximately 6 weeks ago.  Per the patient's daughter, the patient gets frequent UTIs.  Yesterday, the patient developed a fever with shaking chills nausea.  Patient's became more confused with continued fevers this morning.  Patient was brought to the hospital for evaluation.  Fever improved with some Tylenol.  No other palliating or provoking factors.  Denies chest pain, shortness of breath.  Emergency Department Course: CT shows the palpable left renal cell carcinoma.  There appears to be some inflammation surrounding the left kidney.  No other acute finding.  Fever of 103.2, tachycardic, fluid resuscitated.  Blood cultures, urine cultures pending.  Broad-spectrum antibiotic started  Review of Systems:   Pt complains of hesitancy, frequency, incomplete bladder emptying, nocturia x5 or 6.  Pt denies any diarrhea, constipation, abdominal pain, shortness of breath, dyspnea on exertion, orthopnea, cough, wheezing, palpitations, headache, vision changes, lightheadedness, dizziness, melena, rectal bleeding.  Review of systems are otherwise negative      Past Medical History:  Diagnosis Date  . Chronic combined systolic and diastolic  CHF (congestive heart failure) ([place])   . Chronic kidney disease (CKD), stage III (moderate) (HCC)   . Diabetes ([place])   . Frequent falls   . HTN (hypertension)   . Morbid obesity ([place])   . RBBB   . Renal cell carcinoma ([place])    dx'd during TAVR work up  . 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 Burt Knack and Dr. Roxy Manns  . Severe aortic stenosis   . Subdural hematoma ([place]) 06/2020   after a mechanical fall        Past Surgical History:  Procedure Laterality Date  . CRANIOTOMY Right 06/19/2020   Procedure: CRANIOTOMY HEMATOMA EVACUATION SUBDURAL;  Surgeon: Erline Levine, MD;  Location: [facility];  Service: Neurosurgery;  Laterality: Right;  . RIGHT/LEFT HEART CATH AND CORONARY ANGIOGRAPHY N/A 09/18/2020   Procedure: RIGHT/LEFT HEART CATH AND CORONARY ANGIOGRAPHY;  Surgeon: Jolaine Artist, MD;  Location: [facility];  Service: Cardiovascular;  Laterality: N/A;  . ROTATOR CUFF REPAIR    . TEE WITHOUT CARDIOVERSION N/A 09/25/2020   Procedure: TRANSESOPHAGEAL ECHOCARDIOGRAM (TEE);  Surgeon: Sherren Mocha, MD;  Location: [facility];  Service: Open Heart Surgery;  Laterality: N/A;  . TRANSCATHETER AORTIC VALVE REPLACEMENT, TRANSFEMORAL N/A 09/25/2020   Procedure: TRANSCATHETER AORTIC VALVE REPLACEMENT, TRANSFEMORAL;  Surgeon: Sherren Mocha, MD;  Location: [facility];  Service: Open Heart Surgery;  Laterality: N/A;   Social History:  reports that he has never smoked. He has never used smokeless tobacco. He reports that he does not drink alcohol and does not use drugs. Patient lives at home  No Known Allergies  Family History  Problem Relation Age of Onset  . Heart attack Father              Prior to Admission medications   Medication Sig Start Date End Date Taking? Authorizing Provider  acetaminophen (TYLENOL) 325 MG tablet Take 2 tablets (650 mg total)  by mouth every 6 (six) hours as needed. Patient taking differently: Take 650 mg by mouth every 6 (six) hours as needed for mild pain. 09/29/20  Yes Barrett, Evelene Croon, PA-C  traMADol (ULTRAM) 50 MG tablet Take 1 tablet (50 mg total) by mouth 2 (two) times daily as needed. Patient taking differently: Take 50 mg by mouth 2 (two) times daily as needed for moderate pain. 09/29/20  Yes Barrett, Evelene Croon, PA-C  aspirin EC 81 MG tablet Take 1 tablet (81 mg total) by mouth daily. Swallow whole. Patient not taking: Reported on 11/16/2020 11/15/20   Eileen Stanford, PA-C    Physical Exam: BP (!) 94/54   Pulse (!) 108   Temp 99 F (37.2 C) (Oral)   Resp (!) 22   Ht 5\' 11"  (1.803 m)   Wt 108.9 kg   SpO2 97%   BMI 33.47 kg/m    General: Elderly male. Awake and alert and oriented x3. No acute cardiopulmonary distress.   HEENT: Normocephalic atraumatic.  Right and left ears normal in appearance.  Pupils equal, round, reactive to light. Extraocular muscles are intact. Sclerae anicteric and noninjected.  Moist mucosal membranes. No mucosal lesions.   Neck: Neck supple without lymphadenopathy. No carotid bruits. No masses palpated.   Cardiovascular: Regular rate with normal S1-S2 sounds. No murmurs, rubs, gallops auscultated. No JVD.   Respiratory: Good respiratory effort with no wheezes, rales, rhonchi. Lungs clear to auscultation bilaterally.  No accessory muscle use.  Abdomen: Soft, suprapubic tenderness, nondistended. Active bowel sounds. No masses or hepatosplenomegaly   Skin: No rashes, lesions, or ulcerations.  Dry, warm to touch. 2+ dorsalis pedis and radial pulses.  Musculoskeletal: No calf or leg pain. All major joints not erythematous nontender.  No upper or lower joint deformation.  Good ROM.  No contractures   Psychiatric: Intact judgment and insight. Pleasant and cooperative.  Neurologic: No focal neurological deficits. Strength is 5/5 and symmetric in upper and lower  extremities.  Cranial nerves II through XII are grossly intact.           Labs on Admission: I have personally reviewed following labs and imaging studies  CBC: Last Labs      Recent Labs  Lab 11/16/20 1132  WBC 14.0*  NEUTROABS 12.3*  HGB 14.2  HCT 44.6  MCV 81.1  PLT 123*     Basic Metabolic Panel: Last Labs      Recent Labs  Lab 11/16/20 1132  NA 129*  K 4.1  CL 98  CO2 19*  GLUCOSE 252*  BUN 36*  CREATININE 1.44*  CALCIUM 8.9     GFR: Estimated Creatinine Clearance: 50.5 mL/min (A) (by C-G formula based on SCr of 1.44 mg/dL (H)). Liver Function Tests: Last Labs      Recent Labs  Lab 11/16/20 1132  AST 24  ALT 18  ALKPHOS 65  BILITOT 1.3*  PROT 7.7  ALBUMIN 3.4*     Last Labs   No results for input(s): LIPASE, AMYLASE in the last 168 hours.   Last Labs   No results for input(s): AMMONIA in the last 168 hours.   Coagulation Profile: Last Labs  Recent Labs  Lab 11/16/20 1132  INR 1.2     Cardiac Enzymes: Last Labs   No results for input(s): CKTOTAL, CKMB, CKMBINDEX, TROPONINI in the last 168 hours.   BNP (last 3 results) Recent Labs (within last 365 days)  No results for input(s): PROBNP in the last 8760 hours.   HbA1C: Recent Labs (last 2 labs)   No results for input(s): HGBA1C in the last 72 hours.   CBG: Last Labs   No results for input(s): GLUCAP in the last 168 hours.   Lipid Profile: Recent Labs (last 2 labs)   No results for input(s): CHOL, HDL, LDLCALC, TRIG, CHOLHDL, LDLDIRECT in the last 72 hours.   Thyroid Function Tests: Recent Labs (last 2 labs)   No results for input(s): TSH, T4TOTAL, FREET4, T3FREE, THYROIDAB in the last 72 hours.   Anemia Panel: Recent Labs (last 2 labs)   No results for input(s): VITAMINB12, FOLATE, FERRITIN, TIBC, IRON, RETICCTPCT in the last 72 hours.   Urine analysis: Labs (Brief)          Component Value Date/Time   COLORURINE YELLOW 11/16/2020 1235    APPEARANCEUR HAZY (A) 11/16/2020 1235   LABSPEC 1.012 11/16/2020 1235   PHURINE 5.0 11/16/2020 1235   GLUCOSEU >=500 (A) 11/16/2020 1235   HGBUR MODERATE (A) 11/16/2020 1235   BILIRUBINUR NEGATIVE 11/16/2020 1235   KETONESUR 5 (A) 11/16/2020 1235   PROTEINUR 100 (A) 11/16/2020 1235   NITRITE POSITIVE (A) 11/16/2020 1235   LEUKOCYTESUR LARGE (A) 11/16/2020 1235     Sepsis Labs: @LABRCNTIP (procalcitonin:4,lacticidven:4) )        Recent Results (from the past 240 hour(s))  Blood Culture (routine x 2)     Status: None (Preliminary result)   Collection Time: 11/16/20 11:34 AM   Specimen: BLOOD  Result Value Ref Range Status   Specimen Description BLOOD LEFT ANTECUBITAL  Final   Special Requests   Final    Blood Culture adequate volume BOTTLES DRAWN AEROBIC AND ANAEROBIC Performed at [facility], [address], [place], [place] [address]    Culture PENDING  Incomplete   Report Status PENDING  Incomplete  Blood Culture (routine x 2)     Status: None (Preliminary result)   Collection Time: 11/16/20 11:41 AM   Specimen: BLOOD  Result Value Ref Range Status   Specimen Description BLOOD BLOOD RIGHT ARM  Final   Special Requests   Final    Blood Culture results may not be optimal due to an excessive volume of blood received in culture bottles BOTTLES DRAWN AEROBIC AND ANAEROBIC Performed at [facility], [address] [place], [place] [address]    Culture PENDING  Incomplete   Report Status PENDING  Incomplete  Resp Panel by RT-PCR (Flu A&B, Covid) Nasopharyngeal Swab     Status: None   Collection Time: 11/16/20 11:49 AM   Specimen: Nasopharyngeal Swab; Nasopharyngeal(NP) swabs in vial transport medium  Result Value Ref Range Status   SARS Coronavirus 2 by RT PCR NEGATIVE NEGATIVE Final    Comment: (NOTE) SARS-CoV-2 target nucleic acids are NOT DETECTED.  The SARS-CoV-2 RNA is generally detectable in upper respiratory specimens during  the acute phase of infection. The lowest concentration of SARS-CoV-2 viral copies this assay can detect is 138 copies/mL. A negative result does not preclude SARS-Cov-2 infection and should not be used as the sole basis for treatment or other patient management decisions. A negative result may occur with  improper specimen collection/handling, submission of specimen other  than nasopharyngeal swab, presence of viral mutation(s) within the areas targeted by this assay, and inadequate number of viral copies(<138 copies/mL). A negative result must be combined with clinical observations, patient history, and epidemiological information. The expected result is Negative.  Fact Sheet for Patients:  EntrepreneurPulse.com.au  Fact Sheet for Healthcare Providers:  IncredibleEmployment.be  This test is no t yet approved or cleared by the [place] FDA and  has been authorized for detection and/or diagnosis of SARS-CoV-2 by FDA under an Emergency Use Authorization (EUA). This EUA will remain  in effect (meaning this test can be used) for the duration of the COVID-19 declaration under Section 564(b)(1) of the Act, 21 U.S.C.section 360bbb-3(b)(1), unless the authorization is terminated  or revoked sooner.       Influenza A by PCR NEGATIVE NEGATIVE Final   Influenza B by PCR NEGATIVE NEGATIVE Final    Comment: (NOTE) The Xpert Xpress SARS-CoV-2/FLU/RSV plus assay is intended as an aid in the diagnosis of influenza from Nasopharyngeal swab specimens and should not be used as a sole basis for treatment. Nasal washings and aspirates are unacceptable for Xpert Xpress SARS-CoV-2/FLU/RSV testing.  Fact Sheet for Patients: EntrepreneurPulse.com.au  Fact Sheet for Healthcare Providers: IncredibleEmployment.be  This test is not yet approved or cleared by the [place] FDA and has been authorized for  detection and/or diagnosis of SARS-CoV-2 by FDA under an Emergency Use Authorization (EUA). This EUA will remain in effect (meaning this test can be used) for the duration of the COVID-19 declaration under Section 564(b)(1) of the Act, 21 U.S.C. section 360bbb-3(b)(1), unless the authorization is terminated or revoked.  Performed at [facility], [address], [place], [place] [address]      Radiological Exams on Admission:  Imaging Results (Last 48 hours)  CT Abdomen Pelvis W Contrast  Result Date: 11/16/2020 CLINICAL DATA:  Fever and abdominal pain. History of renal cell carcinoma. EXAM: CT ABDOMEN AND PELVIS WITH CONTRAST TECHNIQUE: Multidetector CT imaging of the abdomen and pelvis was performed using the standard protocol following bolus administration of intravenous contrast. CONTRAST:  162mL OMNIPAQUE IOHEXOL 300 MG/ML  SOLN COMPARISON:  Abdominal MRI 09/22/2020.  Abdominal CT 09/20/2020 FINDINGS: Lower chest: Normal heart size post TAVR. Mild breathing motion artifact at the lung bases scattered atelectasis. No confluent consolidation or pleural effusion. Hepatobiliary: No focal liver abnormality is seen. Distended gallbladder with layering stones/sludge. No obvious pericholecystic fat stranding inflammation, there is motion artifact through this region. No biliary dilatation. Pancreas: Fatty atrophy. No ductal dilatation or inflammation. Cystic pancreatic tail lesion on MRI is not well appreciated by CT. Spleen: Enlarged spanning approximately 15 mm cranial caudal, no focal splenic abnormality. 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 Normal right adrenal gland. There is mild right hydroureteronephrosis, with the ureter dilated to the bladder insertion. No evidence of stone or obstructing lesion. There is minimal prominence of the left ureter and renal pelvis. There is right greater than left perinephric edema. Areas of left renal  cortical scarring. Enhancing lesion arising from the lower left kidney measures 2 cm. Previous right renal cyst is grossly stable. Motion artifact obscures detailed assessment. Urinary bladder is physiologically distended, question of bladder wall thickening Stomach/Bowel: Stomach is within normal limits. Appendix is not confidently visualized, no evidence of appendicitis. No evidence of bowel wall thickening, distention, or inflammatory changes. Moderate colonic stool burden. Vascular/Lymphatic: Aortic atherosclerosis. No aortic aneurysm. Small retroperitoneal lymph nodes are not enlarged by size criteria. Reproductive: Prostatic calcifications.  Other: No free air.  No intra-abdominal abscess.  No ascites. Musculoskeletal: Diffuse degenerative disc disease and facet hypertrophy in the included spine. Moderate degenerative change of both hips. No acute osseous abnormality. IMPRESSION: 1. Mild right hydroureteronephrosis, with the ureter dilated to the bladder insertion. Mild prominence of the left ureter and renal pelvis. No evidence of stone or obstructing lesion. There is right greater than left perinephric edema. Findings are suspicious for urinary tract infection. 2. Enhancing 2 cm lesion arising from the lower left kidney, suspicious for renal cell carcinoma, unchanged in size from recent imaging. 3. Distended gallbladder with layering stones/sludge. No obvious pericholecystic fat stranding inflammation, there is motion artifact through this region. 4. Splenomegaly. 5. 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 6. Cystic pancreatic tail lesion on MRI is not well appreciated by CT. Aortic Atherosclerosis (ICD10-I70.0). Electronically Signed   By: Keith Rake M.D.   On: 11/16/2020 16:47   DG Chest Port 1 View  Result Date: 11/16/2020 CLINICAL DATA:  Fever with altered mental status EXAM: PORTABLE CHEST 1 VIEW COMPARISON:  September 24, 2020 FINDINGS: Lungs are clear. Heart size and pulmonary  vascularity are normal. No adenopathy. Patient is status post aortic valve replacement. There is degenerative change in each shoulder. IMPRESSION: No edema or airspace opacity. Heart size within normal limits. Status post aortic valve replacement. Electronically Signed   By: Lowella Grip III M.D.   On: 11/16/2020 11:46     EKG: Independently reviewed.  EKG pending  Assessment/Plan: Active Problems:   Essential hypertension   Acute lower UTI   Controlled type 2 diabetes mellitus with hyperglycemia, without long-term current use of insulin (HCC)   Chronic kidney disease (CKD), stage III (moderate) (HCC)   HTN (hypertension)   Chronic combined systolic and diastolic CHF (congestive heart failure) (HCC)   S/P TAVR (transcatheter aortic valve replacement)   Sepsis ([place])   Left kidney mass    This patient was discussed with the ED physician, including pertinent vitals, physical exam findings, labs, and imaging.  We also discussed care given by the ED provider.  1. 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 Blood and urine culture pending b. Admit to stepdown c. Rocephin 2 g every 24 d. I discussed the patient with Dr. Claudia Desanctis of [facility].  She recommended that the patient's etiology of recurrent infections will be secondary to BPH and urinary retention.  Recommended starting patient on Flomax and a postvoid residual. e. We also discussed starting the patient on low-dose Keflex 250/day after treatment of acute illness to prevent UTIs f. CBC in the morning g. Check lactic acid level 2. Left kidney mass a. Patient has follow-up with [facility] for assessment 3. Status post TAVR approximately 6 weeks ago a. D/w Dr. Ricard Dillon.  No acute need for patient to be transported to [facility] b. Recommended echocardiogram to assure no vegetation c. Keflex to 50 mg daily for infected valve prevention as patient gets frequent UTIs 4. Stage III  chronic kidney disease a. Stable 5. Hypertension 6. Combined systolic and diastolic heart failure a. Currently compensated b. We will watch fluids 7. Diabetes with hyperglycemia a. As patient currently n.p.o., will do sliding scale every 6 hours b. Hemoglobin A1c  DVT prophylaxis: Lovenox Consultants: None Code Status: Full code Family Communication: Patient's girlfriend present during interview and exam Disposition Plan: Pending   Truett Mainland, DO

## 2020-11-20 NOTE — Transfer of Care (Signed)
Immediate Anesthesia Transfer of Care Note  Patient: Brett Williamson  Procedure(s) Performed: TRANSESOPHAGEAL ECHOCARDIOGRAM (TEE) WITH PROPOFOL (N/A )  Patient Location: PACU  Anesthesia Type:General  Level of Consciousness: drowsy  Airway & Oxygen Therapy: Patient Spontanous Breathing and Patient connected to nasal cannula oxygen  Post-op Assessment: Report given to RN and Post -op Vital signs reviewed and stable  Post vital signs: Reviewed and stable  Last Vitals:  Vitals Value Taken Time  BP 120/59   Temp 98.6   Pulse 61   Resp 21   SpO2 95     Last Pain:  Vitals:   11/20/20 0855  TempSrc: Axillary  PainSc: 0-No pain      Patients Stated Pain Goal: 2 (11/91/47 8295)  Complications: No complications documented.

## 2020-11-20 NOTE — Progress Notes (Signed)
*  PRELIMINARY RESULTS* Echocardiogram Echocardiogram Transesophageal has been performed.  [name] 11/20/2020, 10:42 AM

## 2020-11-20 NOTE — Progress Notes (Signed)
Pharmacy Antibiotic Note  Brett Williamson is a 80 y.o. male admitted on 11/16/2020 with bacteremia.  Pharmacy has been consulted for Cefazolin dosing. BCID with MSSA  Plan: Continue cefazolin 2gm IV q8h F/U cxs and clinical progress Monitor V/S, and labs  Height: 5\' 11"  (180.3 cm) Weight: 114.2 kg (251 lb 12.3 oz) IBW/kg (Calculated) : 75.3  Temp (24hrs), Avg:98 F (36.7 C), Min:97.7 F (36.5 C), Max:98.4 F (36.9 C)  Recent Labs  Lab 11/16/20 1132 11/16/20 1333 11/16/20 1739 11/16/20 2030 11/16/20 2300 11/17/20 0507 11/18/20 0608 11/20/20 0406  WBC 14.0*  --   --   --   --  13.6* 7.7 12.0*  CREATININE 1.44*  --   --   --  1.58* 1.55* 1.26* 1.18  LATICACIDVEN 1.6 2.9* 1.5 1.6  --   --   --   --     Estimated Creatinine Clearance: 63.1 mL/min (by C-G formula based on SCr of 1.18 mg/dL).    No Known Allergies  Antimicrobials this admission: Vancomycin 4/8 >> 4/8 Cefepime 4/8 >> 4/8 Flagyl 4/8 >>4/8 Ceftriaxone 4/8>>4/9 Cefazolin 4/9>>  Microbiology results: 4/8 BCx: GPC in anaerobic bottles both sets=> BCID +MSSA 4/9 BCX: ngtd 4/8 UCx: citrobacter  Thank you for allowing pharmacy to be a part of this patient's care.  Margot Ables, PharmD Clinical Pharmacist 11/20/2020 8:26 AM

## 2020-11-20 NOTE — Progress Notes (Signed)
Patient Demographics:    Brett Williamson, is a 80 y.o. male, DOB - Jan 25, 1941, IEP:[id number]  Admit date - 11/16/2020   Admitting Physician Truett Mainland, DO  Outpatient Primary MD for the patient is Neale Burly, MD  LOS - 4   Chief Complaint  Patient presents with  . Fever  . Altered Mental Status        Subjective:    Brett Williamson today has  no emesis,  No chest pain,    -Complains of back pain, x-rays without fracture, cannot rule out discitis in the setting of MSSA bacteremia  No evidence of endocarditis. Normal functioning prosthetic aortic valve    Assessment  & Plan :    Principal Problem:   Severe sepsis with acute organ dysfunction and Septic Shock  due to methicillin susceptible Staphylococcus aureus (MSSA) (HCC) Active Problems:   Essential hypertension   Acute lower UTI   Controlled type 2 diabetes mellitus with hyperglycemia, without long-term current use of insulin (HCC)   Chronic kidney disease (CKD), stage III (moderate) (HCC)   HTN (hypertension)   Chronic combined systolic and diastolic CHF (congestive heart failure) (HCC)   S/P TAVR (transcatheter aortic valve replacement)   Left kidney mass   Brief Summary:- 80 y.o. 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 which was found incidentally during his last hospitalization approximately 6 weeks ago, history of severe aortic stenosis status post TAVR with history of recurrent UTIs admitted on 11/16/2020 with severe sepsis with acute metabolic encephalopathy and concerns for left renal cell carcinoma  A/p 1)Severe Staph Aureus Sepsis with Septic Shock--- POA-on admission patient had fevers, tachycardia, tachypnea,  Leukocytosis, as well as elevated lactic acid and altered mentation and hypotension  (hemodynamic instability) -Weaned off  IV Levophed on 11/17/20  -Sepsis pathophysiology resolved -Lactic acidosis resolved with IV fluids -blood cultures from 11/16/2020 with BCID 2/4 of anaerobic cx back GPC Staph Aureus MRSA Neg--- infected foot ulcers with cellulitis noted please see photos in epic -Repeat blood cultures from 11/17/2020 NGTD -switched from IV Rocephin to Ancef on 11/17/20 Discussed with Dr. Tommy Medal (Infectious Disease) -TTE demonstrates TAVR without evidence of vegetations or endocarditis TEE on 11/20/20 --No evidence of endocarditis. Normal functioning prosthetic aortic valve -Antimicrobials this admission: Vancomycin 4/8>>4/8 Cefepime 4/8>>4/8 Flagyl 4/8 >>4/8 Ceftriaxone 4/8>>4/9 Cefazolin 4/9>>  Microbiology results: 4/8BCx: GPC in anaerobic bottles both sets. 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--patient will need to [facility]  5)S/p TAVR--- patient had aortic valve replacement on 09/25/2020- ---echo from 11/17/2020 shows functioning aortic valve without significant concerns -May need TEE as above #1  6)Chronic diastolic dysfunction CHF--Echo from 11/17/2020 with EF of 60 to 65% and grade 1 diastolic CHF--- -given sepsis pathophysiology with hemodynamic instability patient needs IV fluids but will monitor closely  for any evidence of volume overload  7)DM2-A1c 6.9 reflecting excellent diabetic control PTA Use Novolog/Humalog Sliding scale insulin with Accu-Cheks/Fingersticks as ordered   8)Lower extremity pressure ulcers--- with superimposed cellulitis, antibiotics as above #1 -Wound care  consult noted -Please see photos in epic -May need MRI to rule out osteo- -ABIs ordered  9)social/ethics--palliative care consult appreciated, patient is a DNR  10)LBP---  cannot rule out discitis in the setting of MSSA bacteremia -Lumbar x-rays without fractures, 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  11) generalized weakness and deconditioning/low back pain--physical therapist recommends SNF--- patient is not sure he wants to go to SNF he would prefer to go home with home health  12) antibiotic therapy----patient with MSSA bacteremia, no endocarditis, however patient has a prosthetic valve, there is a concern for possible osteomyelitis and possible discitis--- anticipate IV antibiotics for 6 weeks  Disposition/Need for in-Hospital Stay- patient unable to be discharged at this time due to severe staph sepsis/bacteremia requiring IV antibiotics  Status is: Inpatient  Remains inpatient appropriate because:please see above  Disposition: The patient is from: Home              Anticipated d/c is to: TBD              Anticipated d/c date is: > 3 days              Patient currently is not medically stable to d/c. Barriers: Not Clinically Stable-   Code Status :   Code Status: DNR   Family Communication:    Discussed with daughter and grand-daughter, questions answered  Consults  :  ID  DVT Prophylaxis  :   - SCDs  SCDs Start: 11/16/20 2232  Lab Results  Component Value Date   PLT 112 (L) 11/20/2020    Inpatient Medications  Scheduled Meds: . carvedilol  6.25 mg Oral BID WC  . Chlorhexidine Gluconate Cloth  6 each Topical Daily  . enoxaparin (LOVENOX) injection  55 mg Subcutaneous Q24H  . hydrocerin   Topical BID  . insulin aspart  0-15 Units Subcutaneous TID AC & HS  . methocarbamol  500 mg Oral TID  . senna-docusate  2 tablet Oral QHS  . tamsulosin  0.4 mg Oral QPC supper   Continuous Infusions: . sodium chloride 10  mL/hr at 11/20/20 0720  .  ceFAZolin (ANCEF) IV 2 g (11/20/20 1240)   PRN Meds:.hydrALAZINE, ondansetron **OR** ondansetron (ZOFRAN) IV, oxyCODONE-acetaminophen   Anti-infectives (From admission, onward)   Start     Dose/Rate Route Frequency Ordered Stop   11/17/20 1715  ceFAZolin (ANCEF) IVPB 2g/100 mL premix        2 g 200 mL/hr over 30 Minutes Intravenous Every 8 hours 11/17/20 1616     11/17/20 1200  vancomycin (VANCOREADY) IVPB 1250 mg/250 mL  Status:  Discontinued        1,250 mg 166.7 mL/hr over 90 Minutes Intravenous Every 24 hours 11/16/20 1335 11/16/20 2231   11/17/20 1145  cefTRIAXone (ROCEPHIN) 2 g in sodium chloride 0.9 % 100 mL IVPB  Status:  Discontinued        2 g 200 mL/hr over 30 Minutes Intravenous Every 24 hours 11/16/20 2234 11/17/20 1559   11/17/20 0000  ceFEPIme (MAXIPIME) 2 g in sodium chloride 0.9 % 100 mL IVPB  Status:  Discontinued        2 g 200 mL/hr over 30 Minutes Intravenous Every 12 hours 11/16/20 1254  11/16/20 2231   11/16/20 2245  cefTRIAXone (ROCEPHIN) 2 g in sodium chloride 0.9 % 100 mL IVPB  Status:  Discontinued        2 g 200 mL/hr over 30 Minutes Intravenous Every 24 hours 11/16/20 2231 11/16/20 2234   11/16/20 1130  ceFEPIme (MAXIPIME) 2 g in sodium chloride 0.9 % 100 mL IVPB        2 g 200 mL/hr over 30 Minutes Intravenous  Once 11/16/20 1116 11/16/20 1447   11/16/20 1130  metroNIDAZOLE (FLAGYL) IVPB 500 mg        500 mg 100 mL/hr over 60 Minutes Intravenous  Once 11/16/20 1116 11/16/20 1416   11/16/20 1130  vancomycin (VANCOCIN) IVPB 1000 mg/200 mL premix  Status:  Discontinued        1,000 mg 200 mL/hr over 60 Minutes Intravenous  Once 11/16/20 1116 11/16/20 1126   11/16/20 1130  vancomycin (VANCOREADY) IVPB 2000 mg/400 mL        2,000 mg 200 mL/hr over 120 Minutes Intravenous  Once 11/16/20 1126 11/16/20 1417        Objective:   Vitals:   11/20/20 1030 11/20/20 1037 11/20/20 1109 11/20/20 1117  BP: (!) 141/60 136/61 (!) 167/65    Pulse: (!) 53 (!) 57 (!) 57   Resp: 17 16 14    Temp: 98 F (36.7 C)   98.3 F (36.8 C)  TempSrc:    Oral  SpO2: 96%  100%   Weight:      Height:        Wt Readings from Last 3 Encounters:  11/20/20 114.2 kg  09/29/20 109.1 kg  07/03/20 116.1 kg     Intake/Output Summary (Last 24 hours) at 11/20/2020 1309 Last data filed at 11/20/2020 1008 Gross per 24 hour  Intake 927 ml  Output 3900 ml  Net -2973 ml     Physical Exam  Gen:- Awake, more awake, HEENT:- [place].AT, No sclera icterus Neck-Supple Neck,No JVD,.  Lungs-diminished breath sounds, no wheezing  CV- S1, S2 normal, regular  Abd-  +ve B.Sounds, Abd Soft, No tenderness,    Extremity/Skin:-Please see photos in epic and below Psych-affect is appropriate, oriented x3 Neuro-generalized weakness, no new focal deficits, no tremors MSK-bilateral venous stasis dermatitis, decubitus ulcers of both heels with superimposed cellulitis and necrotic eschar -Right foot  with necrotic/gangrenous-looking toes  Media Information         Document Information  Photos  [place]   11/17/2020 11:03  Attached To:  Hospital Encounter on 11/16/20   Source Information  Roxan Hockey, MD  [facility]    Media Information         Document Information  Photos  [place]   11/17/2020 11:02  Attached To:  Hospital Encounter on 11/16/20   Source Information  Roxan Hockey, MD  [facility]      Data Review:   Micro Results Recent Results (from the past 240 hour(s))  Blood Culture (routine x 2)     Status: Abnormal   Collection Time: 11/16/20 11:34 AM   Specimen: BLOOD  Result Value Ref Range Status   Specimen Description   Final    BLOOD LEFT ANTECUBITAL Performed at [facility] [address], [place], [place] [address]    Special Requests   Final    Blood Culture adequate volume BOTTLES DRAWN AEROBIC AND ANAEROBIC Performed at [facility], [address], [place], [place] [address]     Culture  Setup Time   Final  GRAM POSITIVE COCCI ANAEROBIC BOTTLE ONLY Gram Stain Report Called to,Read Back By and Verified With: ELLER @ [id number] ON [id number] BY HENDERSON L. CRITICAL RESULT CALLED TO, READ BACK BY AND VERIFIED WITH: J,MENDENHALL PHARMD @1429  11/17/20 EB Performed at [facility], [place] [address] [place], [place] [address]    Culture STAPHYLOCOCCUS AUREUS (A)  Final   Report Status 11/19/2020 FINAL  Final   Organism ID, Bacteria STAPHYLOCOCCUS AUREUS  Final      Susceptibility   Staphylococcus aureus - MIC*    CIPROFLOXACIN >=8 RESISTANT Resistant     ERYTHROMYCIN <=0.25 SENSITIVE Sensitive     GENTAMICIN <=0.5 SENSITIVE Sensitive     OXACILLIN 0.5 SENSITIVE Sensitive     TETRACYCLINE <=1 SENSITIVE Sensitive     VANCOMYCIN <=0.5 SENSITIVE Sensitive     TRIMETH/SULFA <=10 SENSITIVE Sensitive     CLINDAMYCIN <=0.25 SENSITIVE Sensitive     RIFAMPIN <=0.5 SENSITIVE Sensitive     Inducible Clindamycin NEGATIVE Sensitive     * STAPHYLOCOCCUS AUREUS  Blood Culture ID Panel (Reflexed)     Status: Abnormal   Collection Time: 11/16/20 11:34 AM  Result Value Ref Range Status   Enterococcus faecalis NOT DETECTED NOT DETECTED Final   Enterococcus Faecium NOT DETECTED NOT DETECTED Final   Listeria monocytogenes NOT DETECTED NOT DETECTED Final   Staphylococcus species DETECTED (A) NOT DETECTED Final    Comment: CRITICAL RESULT CALLED TO, READ BACK BY AND VERIFIED WITH: J,MENDENHALL PHARMD @1429  11/17/20 EB    Staphylococcus aureus (BCID) DETECTED (A) NOT DETECTED Final    Comment: CRITICAL RESULT CALLED TO, READ BACK BY AND VERIFIED WITH: J,MENDENHALL PHARMD @1429  11/17/20 EB    Staphylococcus epidermidis NOT DETECTED NOT DETECTED Final   Staphylococcus lugdunensis NOT DETECTED NOT DETECTED Final   Streptococcus species NOT DETECTED NOT DETECTED Final   Streptococcus agalactiae NOT DETECTED NOT DETECTED Final   Streptococcus pneumoniae NOT DETECTED NOT DETECTED Final    Streptococcus pyogenes NOT DETECTED NOT DETECTED Final   A.calcoaceticus-baumannii NOT DETECTED NOT DETECTED Final   Bacteroides fragilis NOT DETECTED NOT DETECTED Final   Enterobacterales NOT DETECTED NOT DETECTED Final   Enterobacter cloacae complex NOT DETECTED NOT DETECTED Final   Escherichia coli NOT DETECTED NOT DETECTED Final   Klebsiella aerogenes NOT DETECTED NOT DETECTED Final   Klebsiella oxytoca NOT DETECTED NOT DETECTED Final   Klebsiella pneumoniae NOT DETECTED NOT DETECTED Final   Proteus species NOT DETECTED NOT DETECTED Final   Salmonella species NOT DETECTED NOT DETECTED Final   Serratia marcescens NOT DETECTED NOT DETECTED Final   Haemophilus influenzae NOT DETECTED NOT DETECTED Final   Neisseria meningitidis NOT DETECTED NOT DETECTED Final   Pseudomonas aeruginosa NOT DETECTED NOT DETECTED Final   Stenotrophomonas maltophilia NOT DETECTED NOT DETECTED Final   Candida albicans NOT DETECTED NOT DETECTED Final   Candida auris NOT DETECTED NOT DETECTED Final   Candida glabrata NOT DETECTED NOT DETECTED Final   Candida krusei NOT DETECTED NOT DETECTED Final   Candida parapsilosis NOT DETECTED NOT DETECTED Final   Candida tropicalis NOT DETECTED NOT DETECTED Final   Cryptococcus neoformans/gattii NOT DETECTED NOT DETECTED Final   Meth resistant mecA/C and MREJ NOT DETECTED NOT DETECTED Final    Comment: Performed at [facility], [place]. [address] [place], [place] [address]  Blood Culture (routine x 2)     Status: Abnormal   Collection Time: 11/16/20 11:41 AM   Specimen: BLOOD  Result Value Ref Range Status   Specimen  Description   Final    BLOOD BLOOD RIGHT ARM Performed at [facility], [address], [place], [place] [address]    Special Requests   Final    Blood Culture results may not be optimal due to an excessive volume of blood received in culture bottles BOTTLES DRAWN AEROBIC AND ANAEROBIC Performed at [facility], [address] [place], [place]  [address]    Culture  Setup Time   Final    GRAM POSITIVE COCCI ANAEROBIC BOTTLE ONLY Gram Stain Report Called to,Read Back By and Verified With: ELLER @ [id number] ON [id number] BY HENDERSON L. Performed at [facility], [address] [place], [place] [address]    Culture (A)  Final    STAPHYLOCOCCUS AUREUS SUSCEPTIBILITIES PERFORMED ON PREVIOUS CULTURE WITHIN THE LAST 5 DAYS. Performed at [facility], [place] [address], [place], [place] [address]    Report Status 11/19/2020 FINAL  Final  Resp Panel by RT-PCR (Flu A&B, Covid) Nasopharyngeal Swab     Status: None   Collection Time: 11/16/20 11:49 AM   Specimen: Nasopharyngeal Swab; Nasopharyngeal(NP) swabs in vial transport medium  Result Value Ref Range Status   SARS Coronavirus 2 by RT PCR NEGATIVE NEGATIVE Final    Comment: (NOTE) SARS-CoV-2 target nucleic acids are NOT DETECTED.  The SARS-CoV-2 RNA is generally detectable in upper respiratory specimens during the acute phase of infection. The lowest concentration of SARS-CoV-2 viral copies this assay can detect is 138 copies/mL. A negative result does not preclude SARS-Cov-2 infection and should not be used as the sole basis for treatment or other patient management decisions. A negative result may occur with  improper specimen collection/handling, submission of specimen other than nasopharyngeal swab, presence of viral mutation(s) within the areas targeted by this assay, and inadequate number of viral copies(<138 copies/mL). A negative result must be combined with clinical observations, patient history, and epidemiological information. The expected result is Negative.  Fact Sheet for Patients:  EntrepreneurPulse.com.au  Fact Sheet for Healthcare Providers:  IncredibleEmployment.be  This test is no t yet approved or cleared by the [place] FDA and  has been authorized for detection and/or diagnosis of SARS-CoV-2 by FDA under an Emergency  Use Authorization (EUA). This EUA will remain  in effect (meaning this test can be used) for the duration of the COVID-19 declaration under Section 564(b)(1) of the Act, 21 U.S.C.section 360bbb-3(b)(1), unless the authorization is terminated  or revoked sooner.       Influenza A by PCR NEGATIVE NEGATIVE Final   Influenza B by PCR NEGATIVE NEGATIVE Final    Comment: (NOTE) The Xpert Xpress SARS-CoV-2/FLU/RSV plus assay is intended as an aid in the diagnosis of influenza from Nasopharyngeal swab specimens and should not be used as a sole basis for treatment. Nasal washings and aspirates are unacceptable for Xpert Xpress SARS-CoV-2/FLU/RSV testing.  Fact Sheet for Patients: EntrepreneurPulse.com.au  Fact Sheet for Healthcare Providers: IncredibleEmployment.be  This test is not yet approved or cleared by the [place] FDA and has been authorized for detection and/or diagnosis of SARS-CoV-2 by FDA under an Emergency Use Authorization (EUA). This EUA will remain in effect (meaning this test can be used) for the duration of the COVID-19 declaration under Section 564(b)(1) of the Act, 21 U.S.C. section 360bbb-3(b)(1), unless the authorization is terminated or revoked.  Performed at [facility], [address], [place], [place] [address]   Urine culture     Status: Abnormal   Collection Time: 11/16/20 12:36 PM  Specimen: Urine, Catheterized  Result Value Ref Range Status   Specimen Description   Final    URINE, CATHETERIZED Performed at [facility], [address] [place], [place] [address]    Special Requests   Final    NONE Performed at [facility], [address] [place], [place] [address]    Culture >=100,000 COLONIES/mL CITROBACTER FREUNDII (A)  Final   Report Status 11/19/2020 FINAL  Final   Organism ID, Bacteria CITROBACTER FREUNDII (A)  Final      Susceptibility   Citrobacter freundii - MIC*    CEFAZOLIN >=64 RESISTANT  Resistant     CEFEPIME 0.25 SENSITIVE Sensitive     CEFTRIAXONE >=64 RESISTANT Resistant     CIPROFLOXACIN <=0.25 SENSITIVE Sensitive     GENTAMICIN <=1 SENSITIVE Sensitive     IMIPENEM <=0.25 SENSITIVE Sensitive     NITROFURANTOIN <=16 SENSITIVE Sensitive     TRIMETH/SULFA <=20 SENSITIVE Sensitive     PIP/TAZO >=128 RESISTANT Resistant     * >=100,000 COLONIES/mL CITROBACTER FREUNDII  MRSA PCR Screening     Status: None   Collection Time: 11/17/20  3:57 AM   Specimen: Nasal Mucosa; Nasopharyngeal  Result Value Ref Range Status   MRSA by PCR NEGATIVE NEGATIVE Final    Comment:        The GeneXpert MRSA Assay (FDA approved for NASAL specimens only), is one component of a comprehensive MRSA colonization surveillance program. It is not intended to diagnose MRSA infection nor to guide or monitor treatment for MRSA infections. Performed at [facility], [address], [place], [place] [address]   Culture, blood (Routine X 2) w Reflex to ID Panel     Status: None (Preliminary result)   Collection Time: 11/17/20  7:37 PM   Specimen: Left Antecubital; Blood  Result Value Ref Range Status   Specimen Description LEFT ANTECUBITAL  Final   Special Requests   Final    Blood Culture adequate volume BOTTLES DRAWN AEROBIC AND ANAEROBIC   Culture   Final    NO GROWTH 3 DAYS Performed at [facility], [address], [place], [place] [address]    Report Status PENDING  Incomplete  Culture, blood (Routine X 2) w Reflex to ID Panel     Status: None (Preliminary result)   Collection Time: 11/17/20  7:42 PM   Specimen: BLOOD RIGHT HAND  Result Value Ref Range Status   Specimen Description BLOOD RIGHT HAND  Final   Special Requests   Final    Blood Culture results may not be optimal due to an inadequate volume of blood received in culture bottles BOTTLES DRAWN AEROBIC AND ANAEROBIC   Culture   Final    NO GROWTH 3 DAYS Performed at [facility], [address], [place], [place] [address]     Report Status PENDING  Incomplete    Radiology Reports CT Abdomen Pelvis W Contrast  Result Date: 11/16/2020 CLINICAL DATA:  Fever and abdominal pain. History of renal cell carcinoma. EXAM: CT ABDOMEN AND PELVIS WITH CONTRAST TECHNIQUE: Multidetector CT imaging of the abdomen and pelvis was performed using the standard protocol following bolus administration of intravenous contrast. CONTRAST:  176mL OMNIPAQUE IOHEXOL 300 MG/ML  SOLN COMPARISON:  Abdominal MRI 09/22/2020.  Abdominal CT 09/20/2020 FINDINGS: Lower chest: Normal heart size post TAVR. Mild breathing motion artifact at the lung bases scattered atelectasis. No confluent consolidation or pleural effusion. Hepatobiliary: No focal liver abnormality is seen. Distended gallbladder with layering stones/sludge. No obvious pericholecystic fat stranding  inflammation, there is motion artifact through this region. No biliary dilatation. Pancreas: Fatty atrophy. No ductal dilatation or inflammation. Cystic pancreatic tail lesion on MRI is not well appreciated by CT. Spleen: Enlarged spanning approximately 15 mm cranial caudal, no focal splenic abnormality. 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 Normal right adrenal gland. There is mild right hydroureteronephrosis, with the ureter dilated to the bladder insertion. No evidence of stone or obstructing lesion. There is minimal prominence of the left ureter and renal pelvis. There is right greater than left perinephric edema. Areas of left renal cortical scarring. Enhancing lesion arising from the lower left kidney measures 2 cm. Previous right renal cyst is grossly stable. Motion artifact obscures detailed assessment. Urinary bladder is physiologically distended, question of bladder wall thickening Stomach/Bowel: Stomach is within normal limits. Appendix is not confidently visualized, no evidence of appendicitis. No evidence of bowel wall thickening,  distention, or inflammatory changes. Moderate colonic stool burden. Vascular/Lymphatic: Aortic atherosclerosis. No aortic aneurysm. Small retroperitoneal lymph nodes are not enlarged by size criteria. Reproductive: Prostatic calcifications. Other: No free air.  No intra-abdominal abscess.  No ascites. Musculoskeletal: Diffuse degenerative disc disease and facet hypertrophy in the included spine. Moderate degenerative change of both hips. No acute osseous abnormality. IMPRESSION: 1. Mild right hydroureteronephrosis, with the ureter dilated to the bladder insertion. Mild prominence of the left ureter and renal pelvis. No evidence of stone or obstructing lesion. There is right greater than left perinephric edema. Findings are suspicious for urinary tract infection. 2. Enhancing 2 cm lesion arising from the lower left kidney, suspicious for renal cell carcinoma, unchanged in size from recent imaging. 3. Distended gallbladder with layering stones/sludge. No obvious pericholecystic fat stranding inflammation, there is motion artifact through this region. 4. Splenomegaly. 5. 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 6. Cystic pancreatic tail lesion on MRI is not well appreciated by CT. Aortic Atherosclerosis (ICD10-I70.0). Electronically Signed   By: Keith Rake M.D.   On: 11/16/2020 16:47   US ARTERIAL ABI (SCREENING LOWER EXTREMITY)  Result Date: 11/18/2020 CLINICAL DATA:  80 year old male with bilateral lower extremity wounds. EXAM: NONINVASIVE PHYSIOLOGIC VASCULAR STUDY OF BILATERAL LOWER EXTREMITIES TECHNIQUE: Evaluation of both lower extremities were performed at rest, including calculation of ankle-brachial indices with single level Doppler, pressure and pulse volume recording. COMPARISON:  None. FINDINGS: Right ABI:  1.76 Left ABI:  1.67 Right Lower Extremity: Monophasic, dampened waveforms at the level of the ankle. Left Lower Extremity: Monophasic, dampened waveforms at the level of the  ankle. IMPRESSION: Nondiagnostic ankle-brachial indices secondary to noncompressible vessels. Consider lower extremity arterial duplex for further characterization and evaluation for flow-limiting stenoses. Ruthann Cancer, MD [facility] Electronically Signed   By: Ruthann Cancer MD   On: 11/18/2020 09:41   DG CHEST PORT 1 VIEW  Result Date: 11/20/2020 CLINICAL DATA:  Shortness of breath EXAM: PORTABLE CHEST 1 VIEW COMPARISON:  11/16/2020 FINDINGS: Cardiac shadow is within normal limits. Status post TAVR. Lungs are well aerated bilaterally. Mild increased central vascular congestion is noted without significant parenchymal edema. No focal infiltrate is seen. No acute bony abnormality is noted. IMPRESSION: Mild central vascular congestion is noted. Electronically Signed   By: Inez Catalina M.D.   On: 11/20/2020 08:10   DG Chest Port 1 View  Result Date: 11/16/2020 CLINICAL DATA:  Fever with altered mental status EXAM: PORTABLE CHEST 1 VIEW COMPARISON:  September 24, 2020 FINDINGS: Lungs are clear. Heart size and  pulmonary vascularity are normal. No adenopathy. Patient is status post aortic valve replacement. There is degenerative change in each shoulder. IMPRESSION: No edema or airspace opacity. Heart size within normal limits. Status post aortic valve replacement. Electronically Signed   By: Lowella Grip III M.D.   On: 11/16/2020 11:46   ECHOCARDIOGRAM COMPLETE  Result Date: 11/17/2020    ECHOCARDIOGRAM REPORT   Patient Name:   RENAN DANESE Date of Exam: 11/17/2020 Medical Rec #:  [id number]    Height:       71.0 in Accession #:    [id number]   Weight:       240.0 lb Date of Birth:  [birth year]     BSA:          2.278 m Patient Age:    31 years     BP:           128/51 mmHg Patient Gender: M            HR:           64 bpm. Exam Location:  [facility] Procedure: 2D Echo, Cardiac Doppler and Color Doppler Indications:    Fever  History:        Patient has  prior history of Echocardiogram examinations, most                 recent 11/14/2020. CHF, Aortic Valve Disease, Arrythmias:RBBB;                 Risk Factors:Hypertension, Diabetes and morbid obesity. Renal                 cell carcinoma, CKD.  Sonographer:    Dustin Flock RDCS Referring Phys: [place]  1. Left ventricular ejection fraction, by estimation, is 60 to 65%. The left ventricle has normal function. The left ventricle has no regional wall motion abnormalities. There is mild concentric left ventricular hypertrophy. Left ventricular diastolic parameters are consistent with Grade I diastolic dysfunction (impaired relaxation). Elevated left atrial pressure.  2. Right ventricular systolic function is normal. The right ventricular size is normal. Tricuspid regurgitation signal is inadequate for assessing PA pressure.  3. Left atrial size was moderately dilated.  4. The mitral valve is degenerative. Mild mitral valve regurgitation. Moderate mitral annular calcification.  5. The aortic valve has been repaired/replaced. Procedure Date: 09/25/2020. A 55mm Edwards Sapien TAVR valve is present and appears well seated with normal function by Doppler interrogation. Mean gradient 50mmHg, DOI 0.44. Trivial central AI without evidence of paravalvular leak. Comparison(s): Compared to prior TTE on 11/14/20, there is no significant change. AoV mean gradient remains at 13.3. Conclusion(s)/Recommendation(s): No evidence of valvular vegetations on this transthoracic echocardiogram. Would recommend a transesophageal echocardiogram to exclude infective endocarditis if clinically indicated. FINDINGS  Left Ventricle: Left ventricular ejection fraction, by estimation, is 60 to 65%. The left ventricle has normal function. The left ventricle has no regional wall motion abnormalities. The left ventricular internal cavity size was normal in size. There is  mild concentric left ventricular hypertrophy. Left  ventricular diastolic parameters are consistent with Grade I diastolic dysfunction (impaired relaxation). Elevated left atrial pressure. Right Ventricle: The right ventricular size is normal. No increase in right ventricular wall thickness. Right ventricular systolic function is normal. Tricuspid regurgitation signal is inadequate for assessing PA pressure. Left Atrium: Left atrial size was moderately dilated. Right Atrium: Right atrial size was normal in size. Pericardium: There is no evidence of pericardial effusion. Mitral Valve: The  mitral valve is degenerative in appearance. There is moderate thickening of the mitral valve leaflet(s). There is moderate calcification of the mitral valve leaflet(s). Moderate mitral annular calcification. Mild mitral valve regurgitation. Tricuspid Valve: The tricuspid valve is normal in structure. Tricuspid valve regurgitation is trivial. Aortic Valve: A 73mm Edwards Sapien TAVR valve is present and appears well seated with normal function by Doppler interrogation. Mean gradient 8mmHg, DOI 0.44. Trivial central AI without evidence of paravalvular leak. The aortic valve has been repaired/replaced. Aortic valve regurgitation is trivial. Aortic valve mean gradient measures 13.0 mmHg. Aortic valve peak gradient measures 25.2 mmHg. Aortic valve area, by VTI measures 2.17 cm. There is a 29 mm Sapien prosthetic, stented (TAVR) valve present in the aortic position. Pulmonic Valve: The pulmonic valve was normal in structure. Pulmonic valve regurgitation is not visualized. Aorta: The aortic root and ascending aorta are structurally normal, with no evidence of dilitation. IAS/Shunts: No atrial level shunt detected by color flow Doppler.  LEFT VENTRICLE PLAX 2D LVIDd:         4.78 cm  Diastology LVIDs:         2.90 cm  LV e' medial:    5.00 cm/s LV PW:         1.35 cm  LV E/e' medial:  22.2 LV IVS:        1.39 cm  LV e' lateral:   7.40 cm/s LVOT diam:     2.50 cm  LV E/e' lateral: 15.0 LV  SV:         110 LV SV Index:   48 LVOT Area:     4.91 cm  RIGHT VENTRICLE RV Basal diam:  4.74 cm RV S prime:     10.60 cm/s TAPSE (M-mode): 2.4 cm LEFT ATRIUM              Index       RIGHT ATRIUM           Index LA diam:        4.30 cm  1.89 cm/m  RA Area:     17.30 cm LA Vol (A2C):   110.0 ml 48.28 ml/m RA Volume:   51.30 ml  22.52 ml/m LA Vol (A4C):   93.9 ml  41.21 ml/m LA Biplane Vol: 103.0 ml 45.21 ml/m  AORTIC VALVE AV Area (Vmax):    2.17 cm AV Area (Vmean):   2.24 cm AV Area (VTI):     2.17 cm AV Vmax:           251.00 cm/s AV Vmean:          164.000 cm/s AV VTI:            0.507 m AV Peak Grad:      25.2 mmHg AV Mean Grad:      13.0 mmHg LVOT Vmax:         111.00 cm/s LVOT Vmean:        75.000 cm/s LVOT VTI:          0.224 m LVOT/AV VTI ratio: 0.44  AORTA Ao Root diam: 2.90 cm MITRAL VALVE MV Area (PHT): 2.03 cm     SHUNTS MV Decel Time: 373 msec     Systemic VTI:  0.22 m MV E velocity: 111.00 cm/s  Systemic Diam: 2.50 cm MV A velocity: 115.00 cm/s MV E/A ratio:  0.97 Gwyndolyn Kaufman MD Electronically signed by Gwyndolyn Kaufman MD Signature Date/Time: 11/17/2020/12:03:55 PM    Final    ECHOCARDIOGRAM COMPLETE  Result Date: 11/14/2020    ECHOCARDIOGRAM REPORT   Patient Name:   LAKIN ROMER Date of Exam: 11/14/2020 Medical Rec #:  [id number]    Height:       71.0 in Accession #:    [id number]   Weight:       240.5 lb Date of Birth:  October 28, 1940     BSA:          2.281 m Patient Age:    22 years     BP:           150/69 mmHg Patient Gender: M            HR:           71 bpm. 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 CHF, Arrythmias:RBBB; Risk                 Factors:Hypertension and Diabetes. Sleep Apnea. CKD. Aortic                 Valve: 29 mm  Edwards Sapien prosthetic, stented (TAVR) valve is                 present in the aortic position. Procedure Date:09/25/2020.                 Morbid obesity.  Sonographer:    Alvino Chapel RCS Referring Phys: [id number] [place]  1. Left ventricular ejection fraction, by estimation, is 65 to 70%. The left ventricle has normal function. The left ventricle has no regional wall motion abnormalities. There is moderate left ventricular hypertrophy. Left ventricular diastolic parameters are consistent with Grade I diastolic dysfunction (impaired relaxation). Elevated left atrial pressure.  2. Right ventricular systolic function is normal. The right ventricular size is normal.  3. Left atrial size was mildly dilated.  4. The mitral valve is normal in structure. Trivial mitral valve regurgitation. No evidence of mitral stenosis. Moderate mitral annular calcification.  5. Edwards Sapien 3 THV size 29 mm valve is in the AV position. . The aortic valve has been repaired/replaced. Aortic valve regurgitation is not visualized. No aortic stenosis is present. FINDINGS  Left Ventricle: Left ventricular ejection fraction, by estimation, is 65 to 70%. The left ventricle has normal function. The left ventricle has no regional wall motion abnormalities. The left ventricular internal cavity size was normal in size. There is  moderate left ventricular hypertrophy. Left ventricular diastolic parameters are consistent with Grade I diastolic dysfunction (impaired relaxation). Elevated left atrial pressure. Right Ventricle: The right ventricular size is normal. No increase in right ventricular wall thickness. Right ventricular systolic function is normal. Left Atrium: Left atrial size was mildly dilated. Right Atrium: Right atrial size was normal in size. Pericardium: There is no evidence of pericardial effusion. Mitral Valve: The mitral valve is normal in structure. Moderate mitral annular calcification. Trivial mitral  valve regurgitation. No evidence of mitral valve stenosis. MV peak gradient, 7.4 mmHg. The mean mitral valve gradient is 3.0 mmHg. Tricuspid Valve: The tricuspid valve is normal in structure. Tricuspid valve regurgitation is not demonstrated.  No evidence of tricuspid stenosis. Aortic Valve: Edwards Sapien 3 THV size 29 mm valve is in the AV position. The aortic valve has been repaired/replaced. Aortic valve regurgitation is not visualized. No aortic stenosis is present. Aortic valve mean gradient measures 13.3 mmHg. Aortic valve peak gradient measures 26.2 mmHg. Aortic valve area, by VTI measures 1.40 cm. Pulmonic Valve: The pulmonic valve was not well visualized. Pulmonic valve regurgitation is mild. No evidence of pulmonic stenosis. Aorta: The aortic root is normal in size and structure. Pulmonary Artery: Indeterminant PASP, inadequate TR jet. Venous: The inferior vena cava was not well visualized. IAS/Shunts: No atrial level shunt detected by color flow Doppler.  LEFT VENTRICLE PLAX 2D LVIDd:         4.80 cm  Diastology LVIDs:         3.10 cm  LV e' medial:    5.11 cm/s LV PW:         1.40 cm  LV E/e' medial:  19.2 LV IVS:        1.40 cm  LV e' lateral:   6.31 cm/s LVOT diam:     1.90 cm  LV E/e' lateral: 15.5 LV SV:         73 LV SV Index:   32 LVOT Area:     2.84 cm  RIGHT VENTRICLE RV S prime:     12.30 cm/s TAPSE (M-mode): 1.9 cm LEFT ATRIUM             Index       RIGHT ATRIUM           Index LA diam:        4.80 cm 2.10 cm/m  RA Area:     17.30 cm LA Vol (A2C):   97.2 ml 42.62 ml/m RA Volume:   47.90 ml  21.00 ml/m LA Vol (A4C):   62.0 ml 27.19 ml/m LA Biplane Vol: 80.2 ml 35.17 ml/m  AORTIC VALVE AV Area (Vmax):    1.27 cm AV Area (Vmean):   1.32 cm AV Area (VTI):     1.40 cm AV Vmax:           256.00 cm/s AV Vmean:          162.333 cm/s AV VTI:            0.524 m AV Peak Grad:      26.2 mmHg AV Mean Grad:      13.3 mmHg LVOT Vmax:         115.00 cm/s LVOT Vmean:        75.300 cm/s LVOT VTI:           0.259 m LVOT/AV VTI ratio: 0.49  AORTA Ao Root diam: 3.30 cm MITRAL VALVE MV Area (PHT): 1.78 cm     SHUNTS MV Area VTI:   1.26 cm     Systemic VTI:  0.26 m MV Peak grad:  7.4 mmHg     Systemic Diam: 1.90 cm MV Mean grad:  3.0 mmHg MV Vmax:       1.36 m/s MV Vmean:      71.8 cm/s MV Decel Time: 427 msec MV E velocity: 98.00 cm/s MV A velocity: 129.00 cm/s MV E/A ratio:  0.76 Carlyle Dolly MD Electronically signed by Carlyle Dolly MD Signature Date/Time: 11/14/2020/4:43:28 PM    Final    ECHO TEE  Result Date: 11/20/2020    TRANSESOPHOGEAL ECHO REPORT   Patient Name:   Joyce Leonor Date of Exam:  11/20/2020 Medical Rec #:  [id number]    Height:       71.0 in Accession #:    [id number]   Weight:       251.8 lb Date of Birth:  [birth year]     BSA:          2.325 m Patient Age:    26 years     BP:           141/60 mmHg Patient Gender: M            HR:           53 bpm. Exam Location:  [facility] Procedure: Transesophageal Echo Indications:    Bacteremia 790.7 / R78.81  History:        Patient has prior history of Echocardiogram examinations, most                 recent 11/17/2020. Risk Factors:Non-Smoker, Diabetes and                 Hypertension. RBBB, TAVR wtih a 29 mm Edwards S/P TAVR                 (transcatheter aortic valve replacement.  Sonographer:    [name] RDCS (AE) Referring Phys: [place]: The transesophogeal probe was passed without difficulty through the esophogus of the patient. Sedation performed by performing physician. The patient developed no complications during the procedure. IMPRESSIONS  1. Left ventricular ejection fraction, by estimation, is 60 to 65%. The left ventricle has normal function.  2. Right ventricular systolic function is normal. The right ventricular size is normal.  3. Left atrial size was mildly dilated. No left atrial/left atrial appendage thrombus was detected. The LAA emptying velocity was 50 cm/s.  4. Right atrial size was mildly  dilated.  5. The mitral valve is normal in structure. Trivial mitral valve regurgitation. No evidence of mitral stenosis.  6. A 45mm Edwards Sapien TAVR valve is in the AV position with normal function. . The aortic valve has been repaired/replaced. Aortic valve regurgitation is not visualized. No aortic stenosis is present. Conclusion(s)/Recommendation(s): No evidence of vegetation/infective endocarditis on this transesophageal echocardiogram. FINDINGS  Left Ventricle: Left ventricular ejection fraction, by estimation, is 60 to 65%. The left ventricle has normal function. The left ventricular internal cavity size was normal in size. Right Ventricle: The right ventricular size is normal. No increase in right ventricular wall thickness. Right ventricular systolic function is normal. Left Atrium: Left atrial size was mildly dilated. No left atrial/left atrial appendage thrombus was detected. The LAA emptying velocity was 50 cm/s. Right Atrium: Right atrial size was mildly dilated. Pericardium: There is no evidence of pericardial effusion. Mitral Valve: The mitral valve is normal in structure. Trivial mitral valve regurgitation. No evidence of mitral valve stenosis. Tricuspid Valve: The tricuspid valve is normal in structure. Tricuspid valve regurgitation is not demonstrated. No evidence of tricuspid stenosis. Aortic Valve: A 63mm Edwards Sapien TAVR valve is in the AV position with normal function. The aortic valve has been repaired/replaced. Aortic valve regurgitation is not visualized. No aortic stenosis is present. Pulmonic Valve: The pulmonic valve was not well visualized. Pulmonic valve regurgitation is not visualized. No evidence of pulmonic stenosis. Aorta: The aortic root is normal in size and structure. IAS/Shunts: No atrial level shunt detected by color flow Doppler. Carlyle Dolly MD Electronically signed by Carlyle Dolly MD Signature Date/Time: 11/20/2020/11:36:39 AM    Final  CBC Recent Labs   Lab 11/16/20 1132 11/17/20 0507 11/18/20 0608 11/20/20 0406  WBC 14.0* 13.6* 7.7 12.0*  HGB 14.2 11.9* 11.9* 11.5*  HCT 44.6 37.0* 37.6* 36.0*  PLT 123* 95* 80* 112*  MCV 81.1 81.9 81.7 81.8  MCH 25.8* 26.3 25.9* 26.1  MCHC 31.8 32.2 31.6 31.9  RDW 18.4* 18.8* 19.0* 18.6*  LYMPHSABS 0.3*  --   --   --   MONOABS 1.3*  --   --   --   EOSABS 0.0  --   --   --   BASOSABS 0.0  --   --   --     Chemistries  Recent Labs  Lab 11/16/20 1132 11/16/20 2300 11/17/20 0507 11/18/20 0608 11/20/20 0406  NA 129* 130* 131* 130* 131*  K 4.1 3.9 3.9 3.7 3.5  CL 98 102 102 101 98  CO2 19* 19* 21* 21* 24  GLUCOSE 252* 189* 174* 106* 131*  BUN 36* 36* 37* 36* 29*  CREATININE 1.44* 1.58* 1.55* 1.26* 1.18  CALCIUM 8.9 8.2* 8.1* 7.8* 8.0*  AST 24  --   --   --   --   ALT 18  --   --   --   --   ALKPHOS 65  --   --   --   --   BILITOT 1.3*  --   --   --   --    ------------------------------------------------------------------------------------------------------------------ No results for input(s): CHOL, HDL, LDLCALC, TRIG, CHOLHDL, LDLDIRECT in the last 72 hours.  Lab Results  Component Value Date   HGBA1C 6.9 (H) 11/16/2020   ------------------------------------------------------------------------------------------------------------------ No results for input(s): TSH, T4TOTAL, T3FREE, THYROIDAB in the last 72 hours.  Invalid input(s): FREET3 ------------------------------------------------------------------------------------------------------------------ No results for input(s): VITAMINB12, FOLATE, FERRITIN, TIBC, IRON, RETICCTPCT in the last 72 hours.  Coagulation profile Recent Labs  Lab 11/16/20 1132  INR 1.2    No results for input(s): DDIMER in the last 72 hours.  Cardiac Enzymes No results for input(s): CKMB, TROPONINI, MYOGLOBIN in the last 168 hours.  Invalid input(s):  CK ------------------------------------------------------------------------------------------------------------------    Component Value Date/Time   BNP 1,561.0 (H) 09/12/2020 3500     Roxan Hockey M.D on 11/20/2020 at 1:09 PM  Go to [email or web address] - for contact info  [facility] - Office  [phone]

## 2020-11-21 ENCOUNTER — Inpatient Hospital Stay (HOSPITAL_COMMUNITY): Payer: Medicare Other

## 2020-11-21 ENCOUNTER — Encounter (HOSPITAL_COMMUNITY): Payer: Self-pay | Admitting: Cardiology

## 2020-11-21 LAB — CULTURE, BLOOD (ROUTINE X 2)

## 2020-11-21 LAB — GLUCOSE, CAPILLARY
Glucose-Capillary: 147 mg/dL — ABNORMAL HIGH (ref 70–99)
Glucose-Capillary: 169 mg/dL — ABNORMAL HIGH (ref 70–99)
Glucose-Capillary: 189 mg/dL — ABNORMAL HIGH (ref 70–99)
Glucose-Capillary: 157 mg/dL — ABNORMAL HIGH (ref 70–99)

## 2020-11-21 MED ORDER — OXYCODONE HCL 5 MG PO TABS
5.0000 mg | ORAL_TABLET | Freq: Once | ORAL | Status: AC
Start: 2020-11-21 — End: 2020-11-21
  Administered 2020-11-21: 5 mg via ORAL
  Filled 2020-11-21: qty 1

## 2020-11-21 MED ORDER — FENTANYL CITRATE (PF) 100 MCG/2ML IJ SOLN
25.0000 ug | Freq: Once | INTRAMUSCULAR | Status: AC
  Administered 2020-11-21: 25 ug via INTRAVENOUS
  Filled 2020-11-21: qty 2

## 2020-11-21 NOTE — Progress Notes (Signed)
      INFECTIOUS DISEASE ATTENDING ADDENDUM:   Date: 11/21/2020  Patient name: Brett Williamson  Medical record number: [id number]  Date of birth: [birth year]   80 year old man with history of stage III chronic kidney disease diabetes mellitus hypertension obesity left kidney mass severe aortic stenosis status post TAVR 6 weeks ago with diabetic foot ulcers and necrotic lesion of left heel suspicious for deep infection and including possible osteomyelitis now admitted with sepsis on pressors with methicillin sensitive Staph aureus growing from blood cultures.  We have narrowed to cefazolin  TEE negative which is reassuring and we will not have to add gentamicin.  Might consider rifampin after blood cultures finalize if no growth from blood and no forms of drug drug interaction.  Apparently is having back pain and appreciate Dr. Denton Brick having image his spine with an MRI as well as his left heel.  Port is still pending.       [name] 11/21/2020, 4:23 PM

## 2020-11-21 NOTE — TOC Progression Note (Signed)
Transition of Care [facility]) - Progression Note    Patient Details  Name: Brett Williamson MRN: [id number] Date of Birth: [birth year]  [facility]) CM/SW Contact  Natasha Bence, LCSW Phone Number: 11/21/2020, 3:39 PM  Clinical Narrative:    CSW notified of patient's and patient's family request for [facility] due SNF copay. CS[facility] to [facility]. [facility] agreeable to provide [facility] to patient. TOC to follow.    Expected Discharge Plan: [place] Barriers to Discharge: Continued Medical Work up  Expected Discharge Plan and Services Expected Discharge Plan: [place] In-house Referral: Clinical Social Work     Living arrangements for the past 2 months: Apartment                                       Social Determinants of Health (SDOH) Interventions    Readmission Risk Interventions Readmission Risk Prevention Plan 11/19/2020  Transportation Screening Complete  HRI or [place] Complete  Social Work Consult for [place] Planning/Counseling Complete  Medication Review [employer]) Complete

## 2020-11-21 NOTE — Progress Notes (Signed)
Patient Demographics:    Brett Williamson, is a 80 y.o. male, DOB - August 24, 1940, WRU:[id number]  Admit date - 11/16/2020   Admitting Physician No admitting provider for patient encounter.  Outpatient Primary MD for the patient is Neale Burly, MD  LOS - 5   Chief Complaint  Patient presents with  . Fever  . Altered Mental Status        Subjective:    Brett Williamson today has no fevers, no emesis,  No chest pain,   -Back pain persist--will get MRI of lumbar spine and left heel to rule out MSSA discitis and osteomyelitis respectively -Voiding okay  Assessment  & Plan :    Principal Problem:   Severe sepsis with acute organ dysfunction and Septic Shock  due to methicillin susceptible Staphylococcus aureus (MSSA) (Brett Williamson) Active Problems:   Essential hypertension   Acute lower UTI   Controlled type 2 diabetes mellitus with hyperglycemia, without long-term current use of insulin (HCC)   Chronic kidney disease (CKD), stage III (moderate) (HCC)   HTN (hypertension)   Chronic combined systolic and diastolic CHF (congestive heart failure) (HCC)   S/P TAVR (transcatheter aortic valve replacement)   Left kidney mass   Brief Summary:- 80 y.o.malewith a history of stage III chronic kidney disease, diabetes currently diet-controlled, hypertension, obesity, 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 which was found incidentally during his last hospitalization approximately 6 weeks ago, history of severe aortic stenosis status post TAVR with history of recurrent UTIs admitted on 11/16/2020 with severe sepsis with acute metabolic encephalopathy and concerns for left renal cell carcinoma  A/p 1)Severe Staph Aureus Sepsis with Septic Shock--- POA-on admission patient had fevers, tachycardia, tachypnea,  Leukocytosis, as well as elevated lactic acid and altered mentation and hypotension (hemodynamic  instability) -Weaned off  IV Levophed on 11/17/20  -Sepsis pathophysiology resolved -Lactic acidosis resolved with IV fluids -blood cultures from 11/16/2020 with BCID 2/4 of anaerobic cx back GPC Staph Aureus MRSA Neg--- infected foot ulcers with cellulitis noted please see photos in epic -Repeat blood cultures from 11/17/2020 NGTD -switched from IV Rocephin to Ancef on 11/17/20 Discussed with Dr. Tommy Medal (Infectious Disease) -TTE demonstrates TAVR without evidence of vegetations or endocarditis TEE on 11/20/20 --No evidence of endocarditis. 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--patient will need to [facility]  5)S/p TAVR--- patient had aortic valve replacement on 09/25/2020- ---echo from 11/17/2020 shows functioning aortic valve without significant concerns  TEE as above #1 with normal functioning valve  6)Chronic diastolic dysfunction CHF--Echo from 11/17/2020 with EF of 60 to 65% and grade 1 diastolic CHF--- = Appears euvolemic, no evidence of CHF exacerbation at this time  7)DM2-A1c 6.9 reflecting excellent diabetic control PTA Use Novolog/Humalog Sliding  scale insulin with Accu-Cheks/Fingersticks as ordered   8)Lower extremity pressure ulcers--- with superimposed cellulitis, antibiotics as above #1 -Wound care consult noted -Please see photos in epic -The recommends MRI  of left heel to rule out osteo- -ABIs ordered  9)social/ethics--palliative care consult appreciated, patient is a DNR  10)LBP---  cannot rule out discitis in the setting of MSSA bacteremia -Lumbar x-rays without fractures, 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 -Lumbar MRI to rule out discitis  11) generalized weakness and deconditioning/low back pain--physical therapist recommends SNF--- patient is not sure he wants to go to SNF he would prefer to go home with home health  12) antibiotic therapy----patient with MSSA bacteremia, no endocarditis, however patient has a prosthetic valve, there is a concern for possible osteomyelitis and possible discitis--- anticipate IV antibiotics for 6 weeks  Disposition/Need for in-Hospital Stay- patient unable to be discharged at this time due to severe staph sepsis/bacteremia requiring IV antibiotics  Status is: Inpatient  Remains inpatient appropriate because:please see above  Disposition: The patient is from: Home  Anticipated d/c is to: TBD--possibly home with home health  Anticipated d/c date is: 2  Patient currently is not medically stable to d/c. Barriers: Not Clinically Stable-   Code Status :   Code Status: DNR   Family Communication:    Discussed with daughter and grand-daughter, questions answered  Consults  :  ID  DVT Prophylaxis  :   - SCDs  SCDs Start: 11/16/20 2232/Lovenox    Lab Results  Component Value Date   PLT 112 (L) 11/20/2020    Inpatient Medications  Scheduled Meds: . carvedilol  6.25 mg Oral BID WC  . Chlorhexidine Gluconate Cloth  6 each Topical Daily  . enoxaparin (LOVENOX) injection  55 mg Subcutaneous Q24H  . hydrocerin   Topical BID  . insulin aspart  0-15 Units Subcutaneous TID AC & HS  . methocarbamol  500 mg Oral TID  . senna-docusate  2 tablet Oral QHS  . tamsulosin  0.4 mg Oral QPC supper    Continuous Infusions: . sodium chloride 10 mL/hr at 11/20/20 0720  .  ceFAZolin (ANCEF) IV Stopped (11/21/20 0706)   PRN Meds:.hydrALAZINE, ondansetron **OR** ondansetron (ZOFRAN) IV, oxyCODONE-acetaminophen    Anti-infectives (From admission, onward)   Start     Dose/Rate Route Frequency Ordered Stop   11/17/20 1715  ceFAZolin (ANCEF) IVPB 2g/100 mL premix        2 g 200 mL/hr over 30 Minutes Intravenous Every 8 hours 11/17/20 1616     11/17/20 1200  vancomycin (VANCOREADY) IVPB 1250 mg/250 mL  Status:  Discontinued        1,250 mg 166.7 mL/hr over 90 Minutes Intravenous Every 24 hours 11/16/20 1335 11/16/20 2231   11/17/20 1145  cefTRIAXone (ROCEPHIN) 2 g in sodium chloride 0.9 % 100 mL IVPB  Status:  Discontinued        2 g 200 mL/hr over 30 Minutes Intravenous Every 24 hours 11/16/20 2234 11/17/20 1559   11/17/20 0000  ceFEPIme (MAXIPIME) 2 g in sodium chloride 0.9 % 100 mL IVPB  Status:  Discontinued        2 g 200 mL/hr over 30 Minutes Intravenous Every 12 hours 11/16/20 1254 11/16/20 2231   11/16/20 2245  cefTRIAXone (ROCEPHIN) 2 g in sodium chloride 0.9 % 100 mL IVPB  Status:  Discontinued        2 g 200 mL/hr over 30 Minutes Intravenous Every 24  hours 11/16/20 2231 11/16/20 2234   11/16/20 1130  ceFEPIme (MAXIPIME) 2 g in sodium chloride 0.9 % 100 mL IVPB        2 g 200 mL/hr over 30 Minutes Intravenous  Once 11/16/20 1116 11/16/20 1447   11/16/20 1130  metroNIDAZOLE (FLAGYL) IVPB 500 mg        500 mg 100 mL/hr over 60 Minutes Intravenous  Once 11/16/20 1116 11/16/20 1416   11/16/20 1130  vancomycin (VANCOCIN) IVPB 1000 mg/200 mL premix  Status:  Discontinued        1,000 mg 200 mL/hr over 60 Minutes Intravenous  Once 11/16/20 1116 11/16/20 1126   11/16/20 1130  vancomycin (VANCOREADY) IVPB 2000 mg/400 mL        2,000 mg 200 mL/hr over 120 Minutes Intravenous  Once 11/16/20 1126 11/16/20 1417        Objective:   Vitals:   11/21/20 0900 11/21/20 1059 11/21/20  1100 11/21/20 1200  BP: (!) 147/50  (!) 111/48 (!) 129/56  Pulse: 70 (!) 57 (!) 57 (!) 56  Resp: 14 17 15 13   Temp:  98.2 F (36.8 C)    TempSrc:  Oral    SpO2: 98% 97% 100% (!) 88%  Weight:      Height:        Wt Readings from Last 3 Encounters:  11/21/20 112.3 kg  09/29/20 109.1 kg  07/03/20 116.1 kg     Intake/Output Summary (Last 24 hours) at 11/21/2020 1209 Last data filed at 11/21/2020 0959 Gross per 24 hour  Intake 562.98 ml  Output 2800 ml  Net -2237.02 ml     Physical Exam  Gen:- Awake, more awake, HEENT:- [place].AT, No sclera icterus Neck-Supple Neck,No JVD,.  Lungs-improving air movement, no wheezing  CV- S1, S2 normal, regular  Abd-  +ve B.Sounds, Abd Soft, No tenderness,    Extremity/Skin:-Please see photos in epic and below Psych-affect is appropriate, oriented x3 Neuro-generalized weakness, no new focal deficits, no tremors MSK-bilateral venous stasis dermatitis, decubitus ulcers of both heels with superimposed cellulitis and necrotic eschar -Right foot  with necrotic/gangrenous-looking toes   Data Review:   Micro Results Recent Results (from the past 240 hour(s))  Blood Culture (routine x 2)     Status: Abnormal   Collection Time: 11/16/20 11:34 AM   Specimen: BLOOD  Result Value Ref Range Status   Specimen Description   Final    BLOOD LEFT ANTECUBITAL Performed at [facility], [address] [place], [place] [address]    Special Requests   Final    Blood Culture adequate volume BOTTLES DRAWN AEROBIC AND ANAEROBIC Performed at [facility], [address] [place], [place] [address]    Culture  Setup Time   Final    GRAM POSITIVE COCCI ANAEROBIC BOTTLE ONLY Gram Stain Report Called to,Read Back By and Verified With: ELLER @ 0924 ON [id number] BY HENDERSON L. CRITICAL RESULT CALLED TO, READ BACK BY AND VERIFIED WITH: J,MENDENHALL PHARMD @1429  11/17/20 EB Performed at [facility], [facility] [place] [address], [place],  [address]    Culture  STAPHYLOCOCCUS AUREUS (A)  Final   Report Status 11/19/2020 FINAL  Final   Organism ID, Bacteria STAPHYLOCOCCUS AUREUS  Final      Susceptibility   Staphylococcus aureus - MIC*    CIPROFLOXACIN >=8 RESISTANT Resistant     ERYTHROMYCIN <=0.25 SENSITIVE Sensitive     GENTAMICIN <=0.5 SENSITIVE Sensitive     OXACILLIN 0.5 SENSITIVE Sensitive  TETRACYCLINE <=1 SENSITIVE Sensitive     VANCOMYCIN <=0.5 SENSITIVE Sensitive     TRIMETH/SULFA <=10 SENSITIVE Sensitive     CLINDAMYCIN <=0.25 SENSITIVE Sensitive     RIFAMPIN <=0.5 SENSITIVE Sensitive     Inducible Clindamycin NEGATIVE Sensitive     * STAPHYLOCOCCUS AUREUS  Blood Culture ID Panel (Reflexed)     Status: Abnormal   Collection Time: 11/16/20 11:34 AM  Result Value Ref Range Status   Enterococcus faecalis NOT DETECTED NOT DETECTED Final   Enterococcus Faecium NOT DETECTED NOT DETECTED Final   Listeria monocytogenes NOT DETECTED NOT DETECTED Final   Staphylococcus species DETECTED (A) NOT DETECTED Final    Comment: CRITICAL RESULT CALLED TO, READ BACK BY AND VERIFIED WITH: J,MENDENHALL PHARMD @1429  11/17/20 EB    Staphylococcus aureus (BCID) DETECTED (A) NOT DETECTED Final    Comment: CRITICAL RESULT CALLED TO, READ BACK BY AND VERIFIED WITH: J,MENDENHALL PHARMD @1429  11/17/20 EB    Staphylococcus epidermidis NOT DETECTED NOT DETECTED Final   Staphylococcus lugdunensis NOT DETECTED NOT DETECTED Final   Streptococcus species NOT DETECTED NOT DETECTED Final   Streptococcus agalactiae NOT DETECTED NOT DETECTED Final   Streptococcus pneumoniae NOT DETECTED NOT DETECTED Final   Streptococcus pyogenes NOT DETECTED NOT DETECTED Final   A.calcoaceticus-baumannii NOT DETECTED NOT DETECTED Final   Bacteroides fragilis NOT DETECTED NOT DETECTED Final   Enterobacterales NOT DETECTED NOT DETECTED Final   Enterobacter cloacae complex NOT DETECTED NOT DETECTED Final   Escherichia coli NOT DETECTED NOT DETECTED Final   Klebsiella aerogenes NOT  DETECTED NOT DETECTED Final   Klebsiella oxytoca NOT DETECTED NOT DETECTED Final   Klebsiella pneumoniae NOT DETECTED NOT DETECTED Final   Proteus species NOT DETECTED NOT DETECTED Final   Salmonella species NOT DETECTED NOT DETECTED Final   Serratia marcescens NOT DETECTED NOT DETECTED Final   Haemophilus influenzae NOT DETECTED NOT DETECTED Final   Neisseria meningitidis NOT DETECTED NOT DETECTED Final   Pseudomonas aeruginosa NOT DETECTED NOT DETECTED Final   Stenotrophomonas maltophilia NOT DETECTED NOT DETECTED Final   Candida albicans NOT DETECTED NOT DETECTED Final   Candida auris NOT DETECTED NOT DETECTED Final   Candida glabrata NOT DETECTED NOT DETECTED Final   Candida krusei NOT DETECTED NOT DETECTED Final   Candida parapsilosis NOT DETECTED NOT DETECTED Final   Candida tropicalis NOT DETECTED NOT DETECTED Final   Cryptococcus neoformans/gattii NOT DETECTED NOT DETECTED Final   Meth resistant mecA/C and MREJ NOT DETECTED NOT DETECTED Final    Comment: Performed at [facility], [address], [place], [place] [address]  Blood Culture (routine x 2)     Status: Abnormal   Collection Time: 11/16/20 11:41 AM   Specimen: BLOOD  Result Value Ref Range Status   Specimen Description   Final    BLOOD BLOOD RIGHT ARM Performed at [facility], [address], [place], [place] [address]    Special Requests   Final    Blood Culture results may not be optimal due to an excessive volume of blood received in culture bottles Opal Performed at [facility], [address], [place], [place] [address]    Culture  Setup Time   Final    GRAM POSITIVE COCCI ANAEROBIC BOTTLE ONLY Gram Stain Report Called to,Read Back By and Verified With: ELLER @ 2841 ON [id number] BY HENDERSON L. aerobic bottle  GRAM POSITIVE COCCI Performed at [facility], [address], [place], [place] [address]    Culture (  A)  Final    STAPHYLOCOCCUS AUREUS SUSCEPTIBILITIES PERFORMED  ON PREVIOUS CULTURE WITHIN THE LAST 5 DAYS. Performed at [facility], [place] [address] [place], [place] [address]    Report Status 11/19/2020 FINAL  Final  Resp Panel by RT-PCR (Flu A&B, Covid) Nasopharyngeal Swab     Status: None   Collection Time: 11/16/20 11:49 AM   Specimen: Nasopharyngeal Swab; Nasopharyngeal(NP) swabs in vial transport medium  Result Value Ref Range Status   SARS Coronavirus 2 by RT PCR NEGATIVE NEGATIVE Final    Comment: (NOTE) SARS-CoV-2 target nucleic acids are NOT DETECTED.  The SARS-CoV-2 RNA is generally detectable in upper respiratory specimens during the acute phase of infection. The lowest concentration of SARS-CoV-2 viral copies this assay can detect is 138 copies/mL. A negative result does not preclude SARS-Cov-2 infection and should not be used as the sole basis for treatment or other patient management decisions. A negative result may occur with  improper specimen collection/handling, submission of specimen other than nasopharyngeal swab, presence of viral mutation(s) within the areas targeted by this assay, and inadequate number of viral copies(<138 copies/mL). A negative result must be combined with clinical observations, patient history, and epidemiological information. The expected result is Negative.  Fact Sheet for Patients:  EntrepreneurPulse.com.au  Fact Sheet for Healthcare Providers:  IncredibleEmployment.be  This test is no t yet approved or cleared by the [place] FDA and  has been authorized for detection and/or diagnosis of SARS-CoV-2 by FDA under an Emergency Use Authorization (EUA). This EUA will remain  in effect (meaning this test can be used) for the duration of the COVID-19 declaration under Section 564(b)(1) of the Act, 21 U.S.C.section 360bbb-3(b)(1), unless the authorization is terminated  or revoked sooner.       Influenza A by PCR NEGATIVE NEGATIVE Final   Influenza B  by PCR NEGATIVE NEGATIVE Final    Comment: (NOTE) The Xpert Xpress SARS-CoV-2/FLU/RSV plus assay is intended as an aid in the diagnosis of influenza from Nasopharyngeal swab specimens and should not be used as a sole basis for treatment. Nasal washings and aspirates are unacceptable for Xpert Xpress SARS-CoV-2/FLU/RSV testing.  Fact Sheet for Patients: EntrepreneurPulse.com.au  Fact Sheet for Healthcare Providers: IncredibleEmployment.be  This test is not yet approved or cleared by the [place] FDA and has been authorized for detection and/or diagnosis of SARS-CoV-2 by FDA under an Emergency Use Authorization (EUA). This EUA will remain in effect (meaning this test can be used) for the duration of the COVID-19 declaration under Section 564(b)(1) of the Act, 21 U.S.C. section 360bbb-3(b)(1), unless the authorization is terminated or revoked.  Performed at [facility], [address] [place], [place] [address]   Urine culture     Status: Abnormal   Collection Time: 11/16/20 12:36 PM   Specimen: Urine, Catheterized  Result Value Ref Range Status   Specimen Description   Final    URINE, CATHETERIZED Performed at [facility], [address], [place], [place] [address]    Special Requests   Final    NONE Performed at [facility], [address], [place], [place] [address]    Culture >=100,000 COLONIES/mL CITROBACTER FREUNDII (A)  Final   Report Status 11/19/2020 FINAL  Final   Organism ID, Bacteria CITROBACTER FREUNDII (A)  Final      Susceptibility   Citrobacter freundii - MIC*    CEFAZOLIN >=64 RESISTANT Resistant     CEFEPIME 0.25 SENSITIVE Sensitive     CEFTRIAXONE >=64 RESISTANT  Resistant     CIPROFLOXACIN <=0.25 SENSITIVE Sensitive     GENTAMICIN <=1 SENSITIVE Sensitive     IMIPENEM <=0.25 SENSITIVE Sensitive     NITROFURANTOIN <=16 SENSITIVE Sensitive     TRIMETH/SULFA <=20 SENSITIVE Sensitive     PIP/TAZO >=128 RESISTANT  Resistant     * >=100,000 COLONIES/mL CITROBACTER FREUNDII  MRSA PCR Screening     Status: None   Collection Time: 11/17/20  3:57 AM   Specimen: Nasal Mucosa; Nasopharyngeal  Result Value Ref Range Status   MRSA by PCR NEGATIVE NEGATIVE Final    Comment:        The GeneXpert MRSA Assay (FDA approved for NASAL specimens only), is one component of a comprehensive MRSA colonization surveillance program. It is not intended to diagnose MRSA infection nor to guide or monitor treatment for MRSA infections. Performed at [facility], [address], [place], [place] [address]   Culture, blood (Routine X 2) w Reflex to ID Panel     Status: None (Preliminary result)   Collection Time: 11/17/20  7:37 PM   Specimen: Left Antecubital; Blood  Result Value Ref Range Status   Specimen Description LEFT ANTECUBITAL  Final   Special Requests   Final    Blood Culture adequate volume BOTTLES DRAWN AEROBIC AND ANAEROBIC   Culture   Final    NO GROWTH 4 DAYS Performed at [facility], [address], [place], [place] [address]    Report Status PENDING  Incomplete  Culture, blood (Routine X 2) w Reflex to ID Panel     Status: None (Preliminary result)   Collection Time: 11/17/20  7:42 PM   Specimen: BLOOD RIGHT HAND  Result Value Ref Range Status   Specimen Description BLOOD RIGHT HAND  Final   Special Requests   Final    Blood Culture results may not be optimal due to an inadequate volume of blood received in culture bottles BOTTLES DRAWN AEROBIC AND ANAEROBIC   Culture   Final    NO GROWTH 4 DAYS Performed at [facility], [address], [place], [place] [address]    Report Status PENDING  Incomplete    Radiology Reports DG Lumbar Spine 2-3 Views  Result Date: 11/20/2020 CLINICAL DATA:  Low back pain.  No known injury. EXAM: LUMBAR SPINE - 2-3 VIEW COMPARISON:  None. FINDINGS: There are 5 non rib-bearing lumbar type vertebral bodies. Mild scoliotic curvature of the thoracolumbar spine with  dominant caudal component convex the right measuring approximately 7 degrees (as measured from the superior endplate of L1 to the inferior endplate of L4). There is straightening of the expected lumbar lordosis. No anterolisthesis or retrolisthesis. Lumbar vertebral body heights appear preserved. Moderate to severe multilevel lumbar spine DDD, worse at L2-L3, L3-L4 and L4-L5 with disc space height loss, endplate irregularity and small posteriorly directed disc osteophyte complexes at these locations. Limited visualization of the bilateral SI joints is normal. Large colonic stool burden without evidence of enteric obstruction. IMPRESSION: Moderate to severe multilevel lumbar spine DDD, worse at L2-L3, L3-L4 and L4-L5. Electronically Signed   By: Sandi Mariscal M.D.   On: 11/20/2020 15:39   CT Abdomen Pelvis W Contrast  Result Date: 11/16/2020 CLINICAL DATA:  Fever and abdominal pain. History of renal cell carcinoma. EXAM: CT ABDOMEN AND PELVIS WITH CONTRAST TECHNIQUE: Multidetector CT imaging of the abdomen and pelvis was performed using the standard protocol following bolus administration of intravenous contrast. CONTRAST:  139mL OMNIPAQUE IOHEXOL 300 MG/ML  SOLN COMPARISON:  Abdominal MRI 09/22/2020.  Abdominal CT 09/20/2020 FINDINGS: Lower chest: Normal heart size post TAVR. Mild breathing motion artifact at the lung bases scattered atelectasis. No confluent consolidation or pleural effusion. Hepatobiliary: No focal liver abnormality is seen. Distended gallbladder with layering stones/sludge. No obvious pericholecystic fat stranding inflammation, there is motion artifact through this region. No biliary dilatation. Pancreas: Fatty atrophy. No ductal dilatation or inflammation. Cystic pancreatic tail lesion on MRI is not well appreciated by CT. Spleen: Enlarged spanning approximately 15 mm cranial caudal, no focal splenic abnormality. 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 Normal right adrenal gland. There is mild right hydroureteronephrosis, with the ureter dilated to the bladder insertion. No evidence of stone or obstructing lesion. There is minimal prominence of the left ureter and renal pelvis. There is right greater than left perinephric edema. Areas of left renal cortical scarring. Enhancing lesion arising from the lower left kidney measures 2 cm. Previous right renal cyst is grossly stable. Motion artifact obscures detailed assessment. Urinary bladder is physiologically distended, question of bladder wall thickening Stomach/Bowel: Stomach is within normal limits. Appendix is not confidently visualized, no evidence of appendicitis. No evidence of bowel wall thickening, distention, or inflammatory changes. Moderate colonic stool burden. Vascular/Lymphatic: Aortic atherosclerosis. No aortic aneurysm. Small retroperitoneal lymph nodes are not enlarged by size criteria. Reproductive: Prostatic calcifications. Other: No free air.  No intra-abdominal abscess.  No ascites. Musculoskeletal: Diffuse degenerative disc disease and facet hypertrophy in the included spine. Moderate degenerative change of both hips. No acute osseous abnormality. IMPRESSION: 1. Mild right hydroureteronephrosis, with the ureter dilated to the bladder insertion. Mild prominence of the left ureter and renal pelvis. No evidence of stone or obstructing lesion. There is right greater than left perinephric edema. Findings are suspicious for urinary tract infection. 2. Enhancing 2 cm lesion arising from the lower left kidney, suspicious for renal cell carcinoma, unchanged in size from recent imaging. 3. Distended gallbladder with layering stones/sludge. No obvious pericholecystic fat stranding inflammation, there is motion artifact through this region. 4. Splenomegaly. 5. 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 6. Cystic pancreatic tail lesion on MRI is not well appreciated by CT. Aortic  Atherosclerosis (ICD10-I70.0). Electronically Signed   By: Keith Rake M.D.   On: 11/16/2020 16:47   US ARTERIAL ABI (SCREENING LOWER EXTREMITY)  Result Date: 11/18/2020 CLINICAL DATA:  80 year old male with bilateral lower extremity wounds. EXAM: NONINVASIVE PHYSIOLOGIC VASCULAR STUDY OF BILATERAL LOWER EXTREMITIES TECHNIQUE: Evaluation of both lower extremities were performed at rest, including calculation of ankle-brachial indices with single level Doppler, pressure and pulse volume recording. COMPARISON:  None. FINDINGS: Right ABI:  1.76 Left ABI:  1.67 Right Lower Extremity: Monophasic, dampened waveforms at the level of the ankle. Left Lower Extremity: Monophasic, dampened waveforms at the level of the ankle. IMPRESSION: Nondiagnostic ankle-brachial indices secondary to noncompressible vessels. Consider lower extremity arterial duplex for further characterization and evaluation for flow-limiting stenoses. Ruthann Cancer, MD [facility] Radiology Electronically Signed   By: Ruthann Cancer MD   On: 11/18/2020 09:41   DG CHEST PORT 1 VIEW  Result Date: 11/20/2020 CLINICAL DATA:  Shortness of breath EXAM: PORTABLE CHEST 1 VIEW COMPARISON:  11/16/2020 FINDINGS: Cardiac shadow is within normal limits. Status post TAVR. Lungs are well aerated bilaterally. Mild increased central vascular congestion is noted without significant parenchymal edema. No focal infiltrate is seen. No acute bony abnormality is noted. IMPRESSION: Mild central vascular congestion is noted. Electronically Signed  By: Inez Catalina M.D.   On: 11/20/2020 08:10   DG Chest Port 1 View  Result Date: 11/16/2020 CLINICAL DATA:  Fever with altered mental status EXAM: PORTABLE CHEST 1 VIEW COMPARISON:  September 24, 2020 FINDINGS: Lungs are clear. Heart size and pulmonary vascularity are normal. No adenopathy. Patient is status post aortic valve replacement. There is degenerative change in each  shoulder. IMPRESSION: No edema or airspace opacity. Heart size within normal limits. Status post aortic valve replacement. Electronically Signed   By: Lowella Grip III M.D.   On: 11/16/2020 11:46   ECHOCARDIOGRAM COMPLETE  Result Date: 11/17/2020    ECHOCARDIOGRAM REPORT   Patient Name:   NEHEMYAH FOUSHEE Date of Exam: 11/17/2020 Medical Rec #:  [id number]    Height:       71.0 in Accession #:    [id number]   Weight:       240.0 lb Date of Birth:  1941/01/21     BSA:          2.278 m Patient Age:    57 years     BP:           128/51 mmHg Patient Gender: M            HR:           64 bpm. Exam Location:  [facility] Procedure: 2D Echo, Cardiac Doppler and Color Doppler Indications:    Fever  History:        Patient has prior history of Echocardiogram examinations, most                 recent 11/14/2020. CHF, Aortic Valve Disease, Arrythmias:RBBB;                 Risk Factors:Hypertension, Diabetes and morbid obesity. Renal                 cell carcinoma, CKD.  Sonographer:    Dustin Flock RDCS Referring Phys: [place]  1. Left ventricular ejection fraction, by estimation, is 60 to 65%. The left ventricle has normal function. The left ventricle has no regional wall motion abnormalities. There is mild concentric left ventricular hypertrophy. Left ventricular diastolic parameters are consistent with Grade I diastolic dysfunction (impaired relaxation). Elevated left atrial pressure.  2. Right ventricular systolic function is normal. The right ventricular size is normal. Tricuspid regurgitation signal is inadequate for assessing PA pressure.  3. Left atrial size was moderately dilated.  4. The mitral valve is degenerative. Mild mitral valve regurgitation. Moderate mitral annular calcification.  5. The aortic valve has been repaired/replaced. Procedure Date: 09/25/2020. A 10mm Edwards Sapien TAVR valve is present and appears well seated with normal function by Doppler interrogation. Mean gradient  65mmHg, DOI 0.44. Trivial central AI without evidence of paravalvular leak. Comparison(s): Compared to prior TTE on 11/14/20, there is no significant change. AoV mean gradient remains at 13.3. Conclusion(s)/Recommendation(s): No evidence of valvular vegetations on this transthoracic echocardiogram. Would recommend a transesophageal echocardiogram to exclude infective endocarditis if clinically indicated. FINDINGS  Left Ventricle: Left ventricular ejection fraction, by estimation, is 60 to 65%. The left ventricle has normal function. The left ventricle has no regional wall motion abnormalities. The left ventricular internal cavity size was normal in size. There is  mild concentric left ventricular hypertrophy. Left ventricular diastolic parameters are consistent with Grade I diastolic dysfunction (impaired relaxation). Elevated left atrial pressure. Right Ventricle: The right ventricular size is normal. No increase in right  ventricular wall thickness. Right ventricular systolic function is normal. Tricuspid regurgitation signal is inadequate for assessing PA pressure. Left Atrium: Left atrial size was moderately dilated. Right Atrium: Right atrial size was normal in size. Pericardium: There is no evidence of pericardial effusion. Mitral Valve: The mitral valve is degenerative in appearance. There is moderate thickening of the mitral valve leaflet(s). There is moderate calcification of the mitral valve leaflet(s). Moderate mitral annular calcification. Mild mitral valve regurgitation. Tricuspid Valve: The tricuspid valve is normal in structure. Tricuspid valve regurgitation is trivial. Aortic Valve: A 46mm Edwards Sapien TAVR valve is present and appears well seated with normal function by Doppler interrogation. Mean gradient 74mmHg, DOI 0.44. Trivial central AI without evidence of paravalvular leak. The aortic valve has been repaired/replaced. Aortic valve regurgitation is trivial. Aortic valve mean gradient measures  13.0 mmHg. Aortic valve peak gradient measures 25.2 mmHg. Aortic valve area, by VTI measures 2.17 cm. There is a 29 mm Sapien prosthetic, stented (TAVR) valve present in the aortic position. Pulmonic Valve: The pulmonic valve was normal in structure. Pulmonic valve regurgitation is not visualized. Aorta: The aortic root and ascending aorta are structurally normal, with no evidence of dilitation. IAS/Shunts: No atrial level shunt detected by color flow Doppler.  LEFT VENTRICLE PLAX 2D LVIDd:         4.78 cm  Diastology LVIDs:         2.90 cm  LV e' medial:    5.00 cm/s LV PW:         1.35 cm  LV E/e' medial:  22.2 LV IVS:        1.39 cm  LV e' lateral:   7.40 cm/s LVOT diam:     2.50 cm  LV E/e' lateral: 15.0 LV SV:         110 LV SV Index:   48 LVOT Area:     4.91 cm  RIGHT VENTRICLE RV Basal diam:  4.74 cm RV S prime:     10.60 cm/s TAPSE (M-mode): 2.4 cm LEFT ATRIUM              Index       RIGHT ATRIUM           Index LA diam:        4.30 cm  1.89 cm/m  RA Area:     17.30 cm LA Vol (A2C):   110.0 ml 48.28 ml/m RA Volume:   51.30 ml  22.52 ml/m LA Vol (A4C):   93.9 ml  41.21 ml/m LA Biplane Vol: 103.0 ml 45.21 ml/m  AORTIC VALVE AV Area (Vmax):    2.17 cm AV Area (Vmean):   2.24 cm AV Area (VTI):     2.17 cm AV Vmax:           251.00 cm/s AV Vmean:          164.000 cm/s AV VTI:            0.507 m AV Peak Grad:      25.2 mmHg AV Mean Grad:      13.0 mmHg LVOT Vmax:         111.00 cm/s LVOT Vmean:        75.000 cm/s LVOT VTI:          0.224 m LVOT/AV VTI ratio: 0.44  AORTA Ao Root diam: 2.90 cm MITRAL VALVE MV Area (PHT): 2.03 cm     SHUNTS MV Decel Time: 373 msec     Systemic  VTI:  0.22 m MV E velocity: 111.00 cm/s  Systemic Diam: 2.50 cm MV A velocity: 115.00 cm/s MV E/A ratio:  0.97 Gwyndolyn Kaufman MD Electronically signed by Gwyndolyn Kaufman MD Signature Date/Time: 11/17/2020/12:03:55 PM    Final    ECHOCARDIOGRAM COMPLETE  Result Date: 11/14/2020    ECHOCARDIOGRAM REPORT   Patient Name:   SERAJ DUNNAM Date of Exam: 11/14/2020 Medical Rec #:  [id number]    Height:       71.0 in Accession #:    [id number]   Weight:       240.5 lb Date of Birth:  [birth year]     BSA:          2.281 m Patient Age:    31 years     BP:           150/69 mmHg Patient Gender: M            HR:           71 bpm. 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 CHF, Arrythmias:RBBB; Risk                 Factors:Hypertension and Diabetes. Sleep Apnea. CKD. Aortic                 Valve: 29 mm Edwards Sapien prosthetic, stented (TAVR) valve is                 present in the aortic position. Procedure Date:09/25/2020.                 Morbid obesity.  Sonographer:    Alvino Chapel RCS Referring Phys: [id number] [place]  1. Left ventricular ejection fraction, by estimation, is 65 to 70%. The left ventricle has normal function. The left ventricle has no regional wall motion abnormalities. There is moderate left ventricular hypertrophy. Left ventricular diastolic parameters are consistent with Grade I diastolic dysfunction (impaired relaxation). Elevated left atrial pressure.  2. Right ventricular systolic function is normal. The right ventricular size is normal.  3. Left atrial size was mildly dilated.  4. The mitral valve is normal in structure. Trivial mitral valve regurgitation. No evidence of mitral stenosis. Moderate mitral annular calcification.  5. Edwards Sapien 3 THV size 29 mm valve is in the AV position. . The aortic valve has been repaired/replaced. Aortic valve regurgitation is not visualized. No aortic stenosis is present. FINDINGS  Left Ventricle: Left ventricular ejection fraction, by estimation, is 65 to 70%. The left ventricle has normal function. The left ventricle  has no regional wall motion abnormalities. The left ventricular internal cavity size was normal in size. There is  moderate left ventricular hypertrophy. Left ventricular diastolic parameters are consistent with Grade I diastolic dysfunction (impaired relaxation). Elevated left atrial pressure. Right Ventricle: The right ventricular size is normal. No increase in right ventricular wall thickness. Right ventricular systolic function is normal. Left Atrium: Left atrial size was mildly dilated. Right Atrium: Right atrial size was normal in size. Pericardium: There is no evidence of pericardial effusion. Mitral Valve: The mitral  valve is normal in structure. Moderate mitral annular calcification. Trivial mitral valve regurgitation. No evidence of mitral valve stenosis. MV peak gradient, 7.4 mmHg. The mean mitral valve gradient is 3.0 mmHg. Tricuspid Valve: The tricuspid valve is normal in structure. Tricuspid valve regurgitation is not demonstrated. No evidence of tricuspid stenosis. Aortic Valve: Edwards Sapien 3 THV size 29 mm valve is in the AV position. The aortic valve has been repaired/replaced. Aortic valve regurgitation is not visualized. No aortic stenosis is present. Aortic valve mean gradient measures 13.3 mmHg. Aortic valve peak gradient measures 26.2 mmHg. Aortic valve area, by VTI measures 1.40 cm. Pulmonic Valve: The pulmonic valve was not well visualized. Pulmonic valve regurgitation is mild. No evidence of pulmonic stenosis. Aorta: The aortic root is normal in size and structure. Pulmonary Artery: Indeterminant PASP, inadequate TR jet. Venous: The inferior vena cava was not well visualized. IAS/Shunts: No atrial level shunt detected by color flow Doppler.  LEFT VENTRICLE PLAX 2D LVIDd:         4.80 cm  Diastology LVIDs:         3.10 cm  LV e' medial:    5.11 cm/s LV PW:         1.40 cm  LV E/e' medial:  19.2 LV IVS:        1.40 cm  LV e' lateral:   6.31 cm/s LVOT diam:     1.90 cm  LV E/e' lateral:  15.5 LV SV:         73 LV SV Index:   32 LVOT Area:     2.84 cm  RIGHT VENTRICLE RV S prime:     12.30 cm/s TAPSE (M-mode): 1.9 cm LEFT ATRIUM             Index       RIGHT ATRIUM           Index LA diam:        4.80 cm 2.10 cm/m  RA Area:     17.30 cm LA Vol (A2C):   97.2 ml 42.62 ml/m RA Volume:   47.90 ml  21.00 ml/m LA Vol (A4C):   62.0 ml 27.19 ml/m LA Biplane Vol: 80.2 ml 35.17 ml/m  AORTIC VALVE AV Area (Vmax):    1.27 cm AV Area (Vmean):   1.32 cm AV Area (VTI):     1.40 cm AV Vmax:           256.00 cm/s AV Vmean:          162.333 cm/s AV VTI:            0.524 m AV Peak Grad:      26.2 mmHg AV Mean Grad:      13.3 mmHg LVOT Vmax:         115.00 cm/s LVOT Vmean:        75.300 cm/s LVOT VTI:          0.259 m LVOT/AV VTI ratio: 0.49  AORTA Ao Root diam: 3.30 cm MITRAL VALVE MV Area (PHT): 1.78 cm     SHUNTS MV Area VTI:   1.26 cm     Systemic VTI:  0.26 m MV Peak grad:  7.4 mmHg     Systemic Diam: 1.90 cm MV Mean grad:  3.0 mmHg MV Vmax:       1.36 m/s MV Vmean:      71.8 cm/s MV Decel Time: 427 msec MV E velocity: 98.00 cm/s MV A velocity: 129.00 cm/s MV  E/A ratio:  0.76 Carlyle Dolly MD Electronically signed by Carlyle Dolly MD Signature Date/Time: 11/14/2020/4:43:28 PM    Final    ECHO TEE  Result Date: 11/20/2020    TRANSESOPHOGEAL ECHO REPORT   Patient Name:   SANTHOSH GULINO Date of Exam: 11/20/2020 Medical Rec #:  [id number]    Height:       71.0 in Accession #:    [id number]   Weight:       251.8 lb Date of Birth:  [birth year]     BSA:          2.325 m Patient Age:    48 years     BP:           141/60 mmHg Patient Gender: M            HR:           53 bpm. Exam Location:  [facility] Procedure: Transesophageal Echo Indications:    Bacteremia 790.7 / R78.81  History:        Patient has prior history of Echocardiogram examinations, most                 recent 11/17/2020. Risk Factors:Non-Smoker, Diabetes and                 Hypertension. RBBB, TAVR wtih a 29 mm Edwards S/P TAVR                  (transcatheter aortic valve replacement.  Sonographer:    [name] RDCS (AE) Referring Phys: [place]: The transesophogeal probe was passed without difficulty through the esophogus of the patient. Sedation performed by performing physician. The patient developed no complications during the procedure. IMPRESSIONS  1. Left ventricular ejection fraction, by estimation, is 60 to 65%. The left ventricle has normal function.  2. Right ventricular systolic function is normal. The right ventricular size is normal.  3. Left atrial size was mildly dilated. No left atrial/left atrial appendage thrombus was detected. The LAA emptying velocity was 50 cm/s.  4. Right atrial size was mildly dilated.  5. The mitral valve is normal in structure. Trivial mitral valve regurgitation. No evidence of mitral stenosis.  6. A 25mm Edwards Sapien TAVR valve is in the AV position with normal function. . The aortic valve has been repaired/replaced. Aortic valve regurgitation is not visualized. No aortic stenosis is present. Conclusion(s)/Recommendation(s): No evidence of vegetation/infective endocarditis on this transesophageal echocardiogram. FINDINGS  Left Ventricle: Left ventricular ejection fraction, by estimation, is 60 to 65%. The left ventricle has normal function. The left ventricular internal cavity size was normal in size. Right Ventricle: The right ventricular size is normal. No increase in right ventricular wall thickness. Right ventricular systolic function is normal. Left Atrium: Left atrial size was mildly dilated. No left atrial/left atrial appendage thrombus was detected. The LAA emptying velocity was 50 cm/s. Right Atrium: Right atrial size was mildly dilated. Pericardium: There is no evidence of pericardial effusion. Mitral Valve: The mitral valve is normal in structure. Trivial mitral valve regurgitation. No evidence of mitral valve stenosis. Tricuspid Valve: The tricuspid valve is  normal in structure. Tricuspid valve regurgitation is not demonstrated. No evidence of tricuspid stenosis. Aortic Valve: A 25mm Edwards Sapien TAVR valve is in the AV position with normal function. The aortic valve has been repaired/replaced. Aortic valve regurgitation is not visualized. No aortic stenosis is present. Pulmonic Valve: The pulmonic valve was not well visualized. Pulmonic valve regurgitation is  not visualized. No evidence of pulmonic stenosis. Aorta: The aortic root is normal in size and structure. IAS/Shunts: No atrial level shunt detected by color flow Doppler. Carlyle Dolly MD Electronically signed by Carlyle Dolly MD Signature Date/Time: 11/20/2020/11:36:39 AM    Final      CBC Recent Labs  Lab 11/16/20 1132 11/17/20 0507 11/18/20 0608 11/20/20 0406  WBC 14.0* 13.6* 7.7 12.0*  HGB 14.2 11.9* 11.9* 11.5*  HCT 44.6 37.0* 37.6* 36.0*  PLT 123* 95* 80* 112*  MCV 81.1 81.9 81.7 81.8  MCH 25.8* 26.3 25.9* 26.1  MCHC 31.8 32.2 31.6 31.9  RDW 18.4* 18.8* 19.0* 18.6*  LYMPHSABS 0.3*  --   --   --   MONOABS 1.3*  --   --   --   EOSABS 0.0  --   --   --   BASOSABS 0.0  --   --   --     Chemistries  Recent Labs  Lab 11/16/20 1132 11/16/20 2300 11/17/20 0507 11/18/20 0608 11/20/20 0406  NA 129* 130* 131* 130* 131*  K 4.1 3.9 3.9 3.7 3.5  CL 98 102 102 101 98  CO2 19* 19* 21* 21* 24  GLUCOSE 252* 189* 174* 106* 131*  BUN 36* 36* 37* 36* 29*  CREATININE 1.44* 1.58* 1.55* 1.26* 1.18  CALCIUM 8.9 8.2* 8.1* 7.8* 8.0*  AST 24  --   --   --   --   ALT 18  --   --   --   --   ALKPHOS 65  --   --   --   --   BILITOT 1.3*  --   --   --   --    ------------------------------------------------------------------------------------------------------------------ No results for input(s): CHOL, HDL, LDLCALC, TRIG, CHOLHDL, LDLDIRECT in the last 72 hours.  Lab Results  Component Value Date   HGBA1C 6.9 (H) 11/16/2020    ------------------------------------------------------------------------------------------------------------------ No results for input(s): TSH, T4TOTAL, T3FREE, THYROIDAB in the last 72 hours.  Invalid input(s): FREET3 ------------------------------------------------------------------------------------------------------------------ No results for input(s): VITAMINB12, FOLATE, FERRITIN, TIBC, IRON, RETICCTPCT in the last 72 hours.  Coagulation profile Recent Labs  Lab 11/16/20 1132  INR 1.2    No results for input(s): DDIMER in the last 72 hours.  Cardiac Enzymes No results for input(s): CKMB, TROPONINI, MYOGLOBIN in the last 168 hours.  Invalid input(s): CK ------------------------------------------------------------------------------------------------------------------    Component Value Date/Time   BNP 1,561.0 (H) 09/12/2020 5621     Roxan Hockey M.D on 11/21/2020 at 12:09 PM  Go to [email or web address] - for contact info  [facility] - Office  [phone]

## 2020-11-22 LAB — BASIC METABOLIC PANEL
Anion gap: 9 (ref 5–15)
BUN: 29 mg/dL — ABNORMAL HIGH (ref 8–23)
CO2: 26 mmol/L (ref 22–32)
Calcium: 8.2 mg/dL — ABNORMAL LOW (ref 8.9–10.3)
Chloride: 95 mmol/L — ABNORMAL LOW (ref 98–111)
Creatinine, Ser: 1.17 mg/dL (ref 0.61–1.24)
GFR, Estimated: >60 mL/min (ref >60)
Glucose, Bld: 113 mg/dL — ABNORMAL HIGH (ref 70–99)
Potassium: 3.4 mmol/L — ABNORMAL LOW (ref 3.5–5.1)
Sodium: 130 mmol/L — ABNORMAL LOW (ref 135–145)

## 2020-11-22 LAB — CBC
HCT: 39.5 % (ref 39.0–52.0)
Hemoglobin: 12.4 g/dL — ABNORMAL LOW (ref 13.0–17.0)
MCH: 25.8 pg — ABNORMAL LOW (ref 26.0–34.0)
MCHC: 31.4 g/dL (ref 30.0–36.0)
MCV: 82.1 fL (ref 80.0–100.0)
Platelets: 161 10*3/uL (ref 150–400)
RBC: 4.81 MIL/uL (ref 4.22–5.81)
RDW: 18.2 % — ABNORMAL HIGH (ref 11.5–15.5)
WBC: 14.4 10*3/uL — ABNORMAL HIGH (ref 4.0–10.5)
nRBC: 0 % (ref 0.0–0.2)

## 2020-11-22 LAB — CULTURE, BLOOD (ROUTINE X 2)
Culture: NO GROWTH
Culture: NO GROWTH
Special Requests: ADEQUATE

## 2020-11-22 LAB — GLUCOSE, CAPILLARY
Glucose-Capillary: 133 mg/dL — ABNORMAL HIGH (ref 70–99)
Glucose-Capillary: 165 mg/dL — ABNORMAL HIGH (ref 70–99)
Glucose-Capillary: 150 mg/dL — ABNORMAL HIGH (ref 70–99)
Glucose-Capillary: 167 mg/dL — ABNORMAL HIGH (ref 70–99)

## 2020-11-22 MED ORDER — POTASSIUM CHLORIDE CRYS ER 20 MEQ PO TBCR
40.0000 meq | EXTENDED_RELEASE_TABLET | ORAL | Status: AC
  Administered 2020-11-22 (×2): 40 meq via ORAL
  Filled 2020-11-22 (×2): qty 2

## 2020-11-22 MED ORDER — AMLODIPINE BESYLATE 5 MG PO TABS
2.5000 mg | ORAL_TABLET | Freq: Every day | ORAL | Status: DC
  Administered 2020-11-23 – 2020-11-26 (×4): 2.5 mg via ORAL
  Filled 2020-11-22 (×4): qty 1

## 2020-11-22 MED ORDER — CARVEDILOL 3.125 MG PO TABS
3.1250 mg | ORAL_TABLET | Freq: Two times a day (BID) | ORAL | Status: DC
  Administered 2020-11-23 – 2020-11-26 (×7): 3.125 mg via ORAL
  Filled 2020-11-22 (×7): qty 1

## 2020-11-22 NOTE — Progress Notes (Addendum)
      INFECTIOUS DISEASE ATTENDING ADDENDUM:   Date: 11/22/2020  Patient name: Brett Williamson  Medical record number: [id number]  Date of birth: 1940/08/27   80 year old man with history of stage III chronic kidney disease diabetes mellitus hypertension obesity left kidney mass severe aortic stenosis status post TAVR 6 weeks ago with diabetic foot ulcers and necrotic lesion of left heel suspicious for deep infection and including possible osteomyelitis now admitted with sepsis on pressors with methicillin sensitive Staph aureus growing from blood cultures.  We have narrowed to cefazolin  TEE negative which is reassuring and we will not have to add gentamicin.  Might consider rifampin after blood cultures finalize if no growth from blood and no forms of drug drug interaction.  MRI of his lumbar spine is not conclusive for ruling out discitis  Similarly MRI of the foot while favored not to represent infection suggest the possibility that he could have calcaneal osteomyelitis  I would like him to have at least 6 weeks of IV antibiotics with follow-up with me.  Diagnosis: MSSA bacteremia inpatient with TAVR rule out discitis rule out osteomyelitis/discitis  Culture Result: MSSA  No Known Allergies  OPAT Orders   Cefazolin    Duration:  6 weeks End Date:  12/27/2020  [facility] Care Per Protocol:   Labs  weekly while on IV antibiotics: _x_ CBC with differential x_ BMP w GFR/CMP _x_ CRP _x_ ESR   __ Please pull PIC at completion of IV antibiotics _x_ Please leave PIC in place until doctor has seen patient or been notified  Fax weekly labs to [phone]  Clinic Follow Up Appt:   Brett Williamson has an appointment on 12/17/2020 with Dr Brett Williamson at 2 pm   The [facility] for Infectious Disease is located in the [facility] at  [address] in [place].  [address], which is located to the left of the elevators.  Phone: [phone]  Fax:  [phone]  [email or web address]  He should arrive 15 minutes prior to his appointment.  I will sign off for now please call further questions.       Brett Williamson 11/22/2020, 11:27 AM

## 2020-11-22 NOTE — Progress Notes (Addendum)
PHARMACY CONSULT NOTE FOR:  OUTPATIENT  PARENTERAL ANTIBIOTIC THERAPY (OPAT)  Indication: MSSA bacteremia Regimen: Cefazolin 2000 mg IV every 8 hours. End date: Last day of therapy 12/27/2020  IV antibiotic discharge orders are pended. To discharging provider:  please sign these orders via discharge navigator,  Select New Orders & click on the button choice - Manage This Unsigned Work.     Thank you for allowing pharmacy to be a part of this patient's care.  Ramond Craver 11/22/2020, 8:39 AM

## 2020-11-22 NOTE — Progress Notes (Signed)
OT Cancellation Note  Patient Details Name: Brett Williamson MRN: [id number] DOB: 10-10-1940   Cancelled Treatment:    Reason Eval/Treat Not Completed: Patient declined, no reason specified. Upon arrival to room pt reported that he was not willing to get out of bed today because he did too much yesterday. Pt reported he sat in the chair yesterday and was uncomfortable the entire time. Will attempt to see pt later as time permits.   Rivkah Wolz OT, MOT   Larey Seat 11/22/2020, 10:10 AM

## 2020-11-22 NOTE — Progress Notes (Signed)
Patient Demographics:    Brett Williamson, is a 80 y.o. male, DOB - 07/01/1941, ZWC:[id number]  Admit date - 11/16/2020   Admitting Physician Jwan Hornbaker Denton Brick, MD  Outpatient Primary MD for the patient is Neale Burly, MD  LOS - 6   Chief Complaint  Patient presents with  . Fever  . Altered Mental Status        Subjective:    Brett Williamson today has no fevers, no emesis,  No chest pain,   -eating ok -   Assessment  & Plan :    Principal Problem:   Severe sepsis with acute organ dysfunction and Septic Shock  due to methicillin susceptible Staphylococcus aureus (MSSA) ([place]) Active Problems:   Essential hypertension   Acute lower UTI   Controlled type 2 diabetes mellitus with hyperglycemia, without long-term current use of insulin (HCC)   Chronic kidney disease (CKD), stage III (moderate) (HCC)   HTN (hypertension)   Chronic combined systolic and diastolic CHF (congestive heart failure) (HCC)   S/P TAVR (transcatheter aortic valve replacement)   Left kidney mass   Brief Summary:- 80 y.o.malewith a history of stage III chronic kidney disease, diabetes currently diet-controlled, hypertension, obesity, 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 which was found incidentally during his last hospitalization approximately 6 weeks ago, history of severe aortic stenosis status post TAVR with history of recurrent UTIs admitted on 11/16/2020 with severe sepsis with acute metabolic encephalopathy and concerns for left renal cell carcinoma  A/p 1)Severe Staph Aureus Sepsis with Septic Shock--- POA-on admission patient had fevers, tachycardia, tachypnea,  Leukocytosis, as well as elevated lactic acid and altered mentation and hypotension (hemodynamic instability) -Weaned off  IV Levophed on 11/17/20  -Sepsis pathophysiology resolved -Lactic acidosis resolved with IV fluids -blood cultures from 11/16/2020  with BCID 2/4 of anaerobic cx back GPC Staph Aureus MRSA Neg--- infected foot ulcers with cellulitis noted please see photos in epic -Repeat blood cultures from 11/17/2020 NGTD -switched from IV Rocephin to Ancef on 11/17/20 Discussed with Dr. Tommy Medal (Infectious Disease) -TTE demonstrates TAVR without evidence of vegetations or endocarditis TEE on 11/20/20 --No evidence of endocarditis. Normal functioning prosthetic aortic valve -Antimicrobials this admission: Vancomycin4/8>>4/8 Cefepime 4/8>>4/8 Flagyl 4/8 >>4/8 Ceftriaxone 4/8>>4/9 Cefazolin 4/9>> -Appointment on 12/17/2020 with [name] at 2 pm  Microbiology results: 4/8BCx: GPC in anaerobic bottles both sets.BCID +MSSA -Repeat blood cultures from 11/17/2020 NGTD 4/8UCx: GNR 4/8 MRSA PCR is negative  2) CKD stage -3A     -Creatinine is down to 1.1 from 1.58 --renally adjust medications, avoid nephrotoxic agents / dehydration  / hypotension  3)H/o Rt sided subdural hematoma-in November 2021--- ICH was due to Trauma -status post decompression  4)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--patient will need to [facility]  5)S/p TAVR--- patient had aortic valve replacement on 09/25/2020- ---echo from 11/17/2020 shows functioning aortic valve without significant concerns  TEE as above #1 with normal functioning valve  6)Chronic diastolic dysfunction CHF--Echo from 11/17/2020 with EF of 60 to 65% and grade 1 diastolic CHF--- = Appears euvolemic, no evidence of CHF exacerbation at this time  7)DM2-A1c 6.9 reflecting excellent diabetic control PTA Use Novolog/Humalog Sliding scale insulin with Accu-Cheks/Fingersticks as ordered   8)Lower extremity  pressure ulcers--- with superimposed cellulitis, antibiotics as above #1 -Wound care consult noted -Please see photos in epic  MRI of left heel without definite evidence of osteo- -ABIs from 11/18/20 were nondiagnostic, we will get arterial duplex for  evaluation of possible flow-limiting stenosis  9)social/ethics--palliative care consult appreciated, patient is a DNR  10)LBP---  cannot rule out discitis in the setting of MSSA bacteremia -Lumbar x-rays without fractures, 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 -Lumbar MRI without definite evidence of discitis  11) generalized weakness and deconditioning/low back pain--physical therapist recommends SNF--- patient is not sure he wants to go to SNF he would prefer to go home with home health  12) antibiotic therapy----patient with MSSA bacteremia, no endocarditis, however patient has a prosthetic valve, there is a concern for possible osteomyelitis and possible discitis--- anticipate IV antibiotics for 6 weeks   NB!! -Patient will need IV antibiotics at home through home health agency however he does not have for members at home who are able to help him with this at this time--- -his daughter and granddaughter are currently in [place] and  they will not be back here until Sunday 11/25/20--- PT OT team is working on alternative plan to try to discharge patient home however without a family member willing or able to do IV therapy at home this will be difficult  Disposition/Need for in-Hospital Stay- patient unable to be discharged at this time due to severe staph sepsis/bacteremia requiring IV antibiotics  Status is: Inpatient  Remains inpatient appropriate because:please see above  Disposition: The patient is from: Home  Anticipated d/c is to: TBD--possibly home with home health  Anticipated d/c date is: 2  Patient currently is not medically stable to d/c. Barriers: Not Clinically Stable-   Code Status :   Code Status: DNR   Family Communication:    Discussed with daughter and grand-daughter, questions answered  Consults  :  ID  DVT Prophylaxis  :   - SCDs  SCDs Start: 11/16/20  2232/Lovenox    Lab Results  Component Value Date   PLT 161 11/22/2020    Inpatient Medications  Scheduled Meds: . carvedilol  6.25 mg Oral BID WC  . Chlorhexidine Gluconate Cloth  6 each Topical Daily  . enoxaparin (LOVENOX) injection  55 mg Subcutaneous Q24H  . hydrocerin   Topical BID  . insulin aspart  0-15 Units Subcutaneous TID AC & HS  . methocarbamol  500 mg Oral TID  . senna-docusate  2 tablet Oral QHS  . tamsulosin  0.4 mg Oral QPC supper   Continuous Infusions: . sodium chloride Stopped (11/22/20 1645)  .  ceFAZolin (ANCEF) IV Stopped (11/22/20 1410)   PRN Meds:.hydrALAZINE, ondansetron **OR** ondansetron (ZOFRAN) IV, oxyCODONE-acetaminophen    Anti-infectives (From admission, onward)   Start     Dose/Rate Route Frequency Ordered Stop   11/17/20 1715  ceFAZolin (ANCEF) IVPB 2g/100 mL premix        2 g 200 mL/hr over 30 Minutes Intravenous Every 8 hours 11/17/20 1616     04 /09/22 1200  vancomycin (VANCOREADY) IVPB 1250 mg/250 mL  Status:  Discontinued        1,250 mg 166.7 mL/hr over 90 Minutes Intravenous Every 24 hours 11/16/20 1335 11/16/20 2231   11/17/20 1145  cefTRIAXone (ROCEPHIN) 2 g in sodium chloride 0.9 % 100 mL IVPB  Status:  Discontinued        2 g 200 mL/hr over 30 Minutes Intravenous Every  24 hours 11/16/20 2234 11/17/20 1559   11/17/20 0000  ceFEPIme (MAXIPIME) 2 g in sodium chloride 0.9 % 100 mL IVPB  Status:  Discontinued        2 g 200 mL/hr over 30 Minutes Intravenous Every 12 hours 11/16/20 1254 11/16/20 2231   11/16/20 2245  cefTRIAXone (ROCEPHIN) 2 g in sodium chloride 0.9 % 100 mL IVPB  Status:  Discontinued        2 g 200 mL/hr over 30 Minutes Intravenous Every 24 hours 11/16/20 2231 11/16/20 2234   11/16/20 1130  ceFEPIme (MAXIPIME) 2 g in sodium chloride 0.9 % 100 mL IVPB        2 g 200 mL/hr over 30 Minutes Intravenous  Once 11/16/20 1116 11/16/20 1447   11/16/20 1130  metroNIDAZOLE (FLAGYL) IVPB 500 mg        500 mg 100 mL/hr  over 60 Minutes Intravenous  Once 11/16/20 1116 11/16/20 1416   11/16/20 1130  vancomycin (VANCOCIN) IVPB 1000 mg/200 mL premix  Status:  Discontinued        1,000 mg 200 mL/hr over 60 Minutes Intravenous  Once 11/16/20 1116 11/16/20 1126   11/16/20 1130  vancomycin (VANCOREADY) IVPB 2000 mg/400 mL        2,000 mg 200 mL/hr over 120 Minutes Intravenous  Once 11/16/20 1126 11/16/20 1417        Objective:   Vitals:   11/22/20 0610 11/22/20 0824 11/22/20 1417 11/22/20 1638  BP: (!) 132/53 135/63 (!) 125/58 (!) 153/63  Pulse: (!) 59 (!) 53 (!) 52 (!) 51  Resp: 19     Temp: (!) 97.3 F (36.3 C)  98.6 F (37 C)   TempSrc:   Oral   SpO2: 95% 95% 98% 100%  Weight:      Height:        Wt Readings from Last 3 Encounters:  11/21/20 112.3 kg  09/29/20 109.1 kg  07/03/20 116.1 kg     Intake/Output Summary (Last 24 hours) at 11/22/2020 1755 Last data filed at 11/22/2020 1732 Gross per 24 hour  Intake 1195.88 ml  Output 1500 ml  Net -304.12 ml     Physical Exam  Gen:- Awake, more awake, HEENT:- [place].AT, No sclera icterus Neck-Supple Neck,No JVD,.  Lungs-improving air movement, no wheezing  CV- S1, S2 normal, regular  Abd-  +ve B.Sounds, Abd Soft, No tenderness,    Extremity/Skin:-Please see photos in epic and below Psych-affect is appropriate, oriented x3 Neuro-generalized weakness, no new focal deficits, no tremors MSK-bilateral venous stasis dermatitis, decubitus ulcers of both heels with superimposed cellulitis and necrotic eschar -Right foot  with necrotic/gangrenous-looking toes   Data Review:   Micro Results Recent Results (from the past 240 hour(s))  Blood Culture (routine x 2)     Status: Abnormal   Collection Time: 11/16/20 11:34 AM   Specimen: BLOOD  Result Value Ref Range Status   Specimen Description   Final    BLOOD LEFT ANTECUBITAL Performed at [facility], [address], [place], [place] [address]    Special Requests   Final    Blood Culture adequate  volume BOTTLES DRAWN AEROBIC AND ANAEROBIC Performed at [facility], [address], [place], [place] [address]    Culture  Setup Time   Final    GRAM POSITIVE COCCI ANAEROBIC BOTTLE ONLY Gram Stain Report Called to,Read Back By and Verified With: ELLER @ 0924 ON [id number] BY HENDERSON L. CRITICAL RESULT CALLED TO, READ BACK BY AND VERIFIED WITH:  J,MENDENHALL PHARMD @1429  11/17/20 EB Performed at [facility] [address], [place], [place] [address]    Culture STAPHYLOCOCCUS AUREUS (A)  Final   Report Status 11/19/2020 FINAL  Final   Organism ID, Bacteria STAPHYLOCOCCUS AUREUS  Final      Susceptibility   Staphylococcus aureus - MIC*    CIPROFLOXACIN >=8 RESISTANT Resistant     ERYTHROMYCIN <=0.25 SENSITIVE Sensitive     GENTAMICIN <=0.5 SENSITIVE Sensitive     OXACILLIN 0.5 SENSITIVE Sensitive     TETRACYCLINE <=1 SENSITIVE Sensitive     VANCOMYCIN <=0.5 SENSITIVE Sensitive     TRIMETH/SULFA <=10 SENSITIVE Sensitive     CLINDAMYCIN <=0.25 SENSITIVE Sensitive     RIFAMPIN <=0.5 SENSITIVE Sensitive     Inducible Clindamycin NEGATIVE Sensitive     * STAPHYLOCOCCUS AUREUS  Blood Culture ID Panel (Reflexed)     Status: Abnormal   Collection Time: 11/16/20 11:34 AM  Result Value Ref Range Status   Enterococcus faecalis NOT DETECTED NOT DETECTED Final   Enterococcus Faecium NOT DETECTED NOT DETECTED Final   Listeria monocytogenes NOT DETECTED NOT DETECTED Final   Staphylococcus species DETECTED (A) NOT DETECTED Final    Comment: CRITICAL RESULT CALLED TO, READ BACK BY AND VERIFIED WITH: J,MENDENHALL PHARMD @1429  11/17/20 EB    Staphylococcus aureus (BCID) DETECTED (A) NOT DETECTED Final    Comment: CRITICAL RESULT CALLED TO, READ BACK BY AND VERIFIED WITH: J,MENDENHALL PHARMD @1429  11/17/20 EB    Staphylococcus epidermidis NOT DETECTED NOT DETECTED Final   Staphylococcus lugdunensis NOT DETECTED NOT DETECTED Final   Streptococcus species NOT DETECTED NOT DETECTED Final    Streptococcus agalactiae NOT DETECTED NOT DETECTED Final   Streptococcus pneumoniae NOT DETECTED NOT DETECTED Final   Streptococcus pyogenes NOT DETECTED NOT DETECTED Final   A.calcoaceticus-baumannii NOT DETECTED NOT DETECTED Final   Bacteroides fragilis NOT DETECTED NOT DETECTED Final   Enterobacterales NOT DETECTED NOT DETECTED Final   Enterobacter cloacae complex NOT DETECTED NOT DETECTED Final   Escherichia coli NOT DETECTED NOT DETECTED Final   Klebsiella aerogenes NOT DETECTED NOT DETECTED Final   Klebsiella oxytoca NOT DETECTED NOT DETECTED Final   Klebsiella pneumoniae NOT DETECTED NOT DETECTED Final   Proteus species NOT DETECTED NOT DETECTED Final   Salmonella species NOT DETECTED NOT DETECTED Final   Serratia marcescens NOT DETECTED NOT DETECTED Final   Haemophilus influenzae NOT DETECTED NOT DETECTED Final   Neisseria meningitidis NOT DETECTED NOT DETECTED Final   Pseudomonas aeruginosa NOT DETECTED NOT DETECTED Final   Stenotrophomonas maltophilia NOT DETECTED NOT DETECTED Final   Candida albicans NOT DETECTED NOT DETECTED Final   Candida auris NOT DETECTED NOT DETECTED Final   Candida glabrata NOT DETECTED NOT DETECTED Final   Candida krusei NOT DETECTED NOT DETECTED Final   Candida parapsilosis NOT DETECTED NOT DETECTED Final   Candida tropicalis NOT DETECTED NOT DETECTED Final   Cryptococcus neoformans/gattii NOT DETECTED NOT DETECTED Final   Meth resistant mecA/C and MREJ NOT DETECTED NOT DETECTED Final    Comment: Performed at [facility], [place]. [address], [place], [place] [address]  Blood Culture (routine x 2)     Status: Abnormal   Collection Time: 11/16/20 11:41 AM   Specimen: BLOOD  Result Value Ref Range Status   Specimen Description   Final    BLOOD BLOOD RIGHT ARM Performed at [facility], [address] [place], [place] [address]    Special Requests   Final    Blood  Culture results may not be optimal due to an excessive volume of blood received in  culture bottles BOTTLES DRAWN AEROBIC AND ANAEROBIC Performed at [facility], [address], [place], [place] [address]    Culture  Setup Time   Final    GRAM POSITIVE COCCI ANAEROBIC BOTTLE ONLY Gram Stain Report Called to,Read Back By and Verified With: ELLER @ 8242 ON [id number] BY HENDERSON L. aerobic bottle  GRAM POSITIVE COCCI Performed at [facility], [address], [place], [place] [address]    Culture (A)  Final    STAPHYLOCOCCUS AUREUS SUSCEPTIBILITIES PERFORMED ON PREVIOUS CULTURE WITHIN THE LAST 5 DAYS. Performed at [facility], [place] [address] [place], [place] [address]    Report Status 11/19/2020 FINAL  Final  Resp Panel by RT-PCR (Flu A&B, Covid) Nasopharyngeal Swab     Status: None   Collection Time: 11/16/20 11:49 AM   Specimen: Nasopharyngeal Swab; Nasopharyngeal(NP) swabs in vial transport medium  Result Value Ref Range Status   SARS Coronavirus 2 by RT PCR NEGATIVE NEGATIVE Final    Comment: (NOTE) SARS-CoV-2 target nucleic acids are NOT DETECTED.  The SARS-CoV-2 RNA is generally detectable in upper respiratory specimens during the acute phase of infection. The lowest concentration of SARS-CoV-2 viral copies this assay can detect is 138 copies/mL. A negative result does not preclude SARS-Cov-2 infection and should not be used as the sole basis for treatment or other patient management decisions. A negative result may occur with  improper specimen collection/handling, submission of specimen other than nasopharyngeal swab, presence of viral mutation(s) within the areas targeted by this assay, and inadequate number of viral copies(<138 copies/mL). A negative result must be combined with clinical observations, patient history, and epidemiological information. The expected result is Negative.  Fact Sheet for Patients:  EntrepreneurPulse.com.au  Fact Sheet for Healthcare Providers:  IncredibleEmployment.be  This test is  no t yet approved or cleared by the [place] FDA and  has been authorized for detection and/or diagnosis of SARS-CoV-2 by FDA under an Emergency Use Authorization (EUA). This EUA will remain  in effect (meaning this test can be used) for the duration of the COVID-19 declaration under Section 564(b)(1) of the Act, 21 U.S.C.section 360bbb-3(b)(1), unless the authorization is terminated  or revoked sooner.       Influenza A by PCR NEGATIVE NEGATIVE Final   Influenza B by PCR NEGATIVE NEGATIVE Final    Comment: (NOTE) The Xpert Xpress SARS-CoV-2/FLU/RSV plus assay is intended as an aid in the diagnosis of influenza from Nasopharyngeal swab specimens and should not be used as a sole basis for treatment. Nasal washings and aspirates are unacceptable for Xpert Xpress SARS-CoV-2/FLU/RSV testing.  Fact Sheet for Patients: EntrepreneurPulse.com.au  Fact Sheet for Healthcare Providers: IncredibleEmployment.be  This test is not yet approved or cleared by the [place] FDA and has been authorized for detection and/or diagnosis of SARS-CoV-2 by FDA under an Emergency Use Authorization (EUA). This EUA will remain in effect (meaning this test can be used) for the duration of the COVID-19 declaration under Section 564(b)(1) of the Act, 21 U.S.C. section 360bbb-3(b)(1), unless the authorization is terminated or revoked.  Performed at [facility], [address], [place], [place] [address]   Urine culture     Status: Abnormal   Collection Time: 11/16/20 12:36 PM   Specimen: Urine, Catheterized  Result Value Ref Range Status   Specimen Description   Final    URINE, CATHETERIZED Performed at [facility], [address] [place], [place] [address]    Special Requests   Final    NONE Performed at [facility], [address], [place], [place] [address]    Culture >=100,000 COLONIES/mL CITROBACTER FREUNDII (A)  Final   Report Status 11/19/2020 FINAL   Final   Organism ID, Bacteria CITROBACTER FREUNDII (A)  Final      Susceptibility   Citrobacter freundii - MIC*    CEFAZOLIN >=64 RESISTANT Resistant     CEFEPIME 0.25 SENSITIVE Sensitive     CEFTRIAXONE >=64 RESISTANT Resistant     CIPROFLOXACIN <=0.25 SENSITIVE Sensitive     GENTAMICIN <=1 SENSITIVE Sensitive     IMIPENEM <=0.25 SENSITIVE Sensitive     NITROFURANTOIN <=16 SENSITIVE Sensitive     TRIMETH/SULFA <=20 SENSITIVE Sensitive     PIP/TAZO >=128 RESISTANT Resistant     * >=100,000 COLONIES/mL CITROBACTER FREUNDII  MRSA PCR Screening     Status: None   Collection Time: 11/17/20  3:57 AM   Specimen: Nasal Mucosa; Nasopharyngeal  Result Value Ref Range Status   MRSA by PCR NEGATIVE NEGATIVE Final    Comment:        The GeneXpert MRSA Assay (FDA approved for NASAL specimens only), is one component of a comprehensive MRSA colonization surveillance program. It is not intended to diagnose MRSA infection nor to guide or monitor treatment for MRSA infections. Performed at [facility], [address] [place], [place] [address]   Culture, blood (Routine X 2) w Reflex to ID Panel     Status: None   Collection Time: 11/17/20  7:37 PM   Specimen: Left Antecubital; Blood  Result Value Ref Range Status   Specimen Description LEFT ANTECUBITAL  Final   Special Requests   Final    Blood Culture adequate volume BOTTLES DRAWN AEROBIC AND ANAEROBIC   Culture   Final    NO GROWTH 5 DAYS Performed at [facility], [address] [place], [place] [address]    Report Status 11/22/2020 FINAL  Final  Culture, blood (Routine X 2) w Reflex to ID Panel     Status: None   Collection Time: 11/17/20  7:42 PM   Specimen: BLOOD RIGHT HAND  Result Value Ref Range Status   Specimen Description BLOOD RIGHT HAND  Final   Special Requests   Final    Blood Culture results may not be optimal due to an inadequate volume of blood received in culture bottles BOTTLES DRAWN AEROBIC AND ANAEROBIC    Culture   Final    NO GROWTH 5 DAYS Performed at [facility], [address], [place],  [address]    Report Status 11/22/2020 FINAL  Final    Radiology Reports DG Lumbar Spine 2-3 Views  Result Date: 11/20/2020 CLINICAL DATA:  Low back pain.  No known injury. EXAM: LUMBAR SPINE - 2-3 VIEW COMPARISON:  None. FINDINGS: There are 5 non rib-bearing lumbar type vertebral bodies. Mild scoliotic curvature of the thoracolumbar spine with dominant caudal component convex the right measuring approximately 7 degrees (as measured from the superior endplate of L1 to the inferior endplate of L4). There is straightening of the expected lumbar lordosis. No anterolisthesis or retrolisthesis. Lumbar vertebral body heights appear preserved. Moderate to severe multilevel lumbar spine DDD, worse at L2-L3, L3-L4 and L4-L5 with disc space height loss, endplate irregularity and small posteriorly directed disc osteophyte complexes at these locations. Limited visualization of the bilateral SI joints is normal. Large colonic stool burden without evidence of enteric obstruction. IMPRESSION:  Moderate to severe multilevel lumbar spine DDD, worse at L2-L3, L3-L4 and L4-L5. Electronically Signed   By: Sandi Mariscal M.D.   On: 11/20/2020 15:39   MR LUMBAR SPINE WO CONTRAST  Result Date: 11/21/2020 CLINICAL DATA:  Low back pain.  Infection suspected. EXAM: MRI LUMBAR SPINE WITHOUT CONTRAST TECHNIQUE: Multiplanar, multisequence MR imaging of the lumbar spine was performed. No intravenous contrast was administered. COMPARISON:  None. FINDINGS: Segmentation: Standard segmentation is assumed. The inferior-most fully formed intervertebral disc is labeled L5-S1. Alignment: No substantial sagittal subluxation. Broad dextrocurvature. Vertebrae: There is mild edema involving the L1-L2, L2-L3, L3-L4, and L4-L5 intervertebral discs; however, this is favored degenerative in etiology given there is minimal bone marrow edema and surrounding  endplate signal changes appear characteristic of degenerative/Modic change. Diffuse T1 hypointensity of the marrow. Conus medullaris and cauda equina: Conus extends to the level. Conus and cauda equina appear normal. Paraspinal and other soft tissues: Mild STIR hyperintense paraspinous edema in the upper lumbar/lower thoracic spine. Disc levels: T12-L1: Disc bulging and mild facet hypertrophy without significant canal or foraminal stenosis. L1-L2: Broad disc bulge with superimposed central inferiorly dissecting disc protrusion. Moderate bilateral facet hypertrophy and ligamentum flavum thickening. Prominent dorsal epidural fat. Resulting mild to moderate canal stenosis. No significant foraminal stenosis. L2-L3: Disc height loss and desiccation. Broad disc bulge with moderate bilateral facet hypertrophy and ligamentum flavum thickening. Prominent dorsal epidural fat. Resulting moderate canal stenosis and mild to moderate bilateral foraminal stenosis. L3-L4: Disc height loss and desiccation. Broad disc bulge with superimposed left subarticular disc protrusion. Resulting moderate to severe canal stenosis and severe left subarticular recess stenosis. Moderate left and mild-to-moderate right foraminal stenosis. L4-L5: Disc height loss and desiccation. Posterior disc bulge, severe bilateral facet hypertrophy. Resulting mild to moderate central canal stenosis with suspected bilateral subarticular recess narrowing. Evaluation is limited by motion at this level. Moderate to severe right greater than left foraminal stenosis. L5-S1: Broad disc bulge. Severe right and moderate left facet hypertrophy. Mild bilateral foraminal stenosis. No significant canal stenosis. IMPRESSION: 1. Mild edema involving the L1-L2, L2-L3, L3-L4, and L4-L5 intervertebral discs and mild upper lumbar/lower thoracic paraspinal soft tissue edema, which is favored degenerative in etiology given severe multilevel degenerative change and given endplate  signal changes appear more characteristic of degenerative/Modic change. Early discitis is not completely excluded and a short interval follow-up MRI (with contrast if possible) could ensure stability if clinically indicated. 2. Moderate to severe canal and severe left subarticular recess stenosis at L3-L4. Moderate canal stenosis at L2-L3 and mild to moderate canal stenosis at L1-L2 and L4-L5. Suspected bilateral subarticular recess narrowing at L4-L5. 3. Moderate to severe right greater than left foraminal stenosis at L4-L5. Moderate left foraminal stenosis at L3-L4. Mild to moderate foraminal stenosis on the right at L3-L4 and bilaterally at L2-L3. 4. Diffuse T1 hypointensity of the marrow, which is nonspecific but could relate to chronic anemia, chronic hypoxia (such as in smokers) or underlying lymphoproliferative disorder). Electronically Signed   By: Margaretha Sheffield MD   On: 11/21/2020 16:24   MR HEEL LEFT WO CONTRAST  Result Date: 11/21/2020 CLINICAL DATA:  Osteomyelitis of the foot EXAM: MR OF THE LEFT HEEL WITHOUT CONTRAST TECHNIQUE: Multiplanar, multisequence MR imaging of the ankle was performed. No intravenous contrast was administered. COMPARISON:  None. FINDINGS: TENDONS Peroneal: Longitudinal tearing of the peroneus longus tendon extending along a nearly 5 cm segment distal to the peroneal tubercle. Posteromedial: Small type 1 accessory navicular. Mild distal tibialis posterior tendinopathy, correlate  clinically in assessing for tibialis posterior dysfunction. Mild flexor hallucis longus tenosynovitis just proximal to the knot of Henry. Anterior: Unremarkable Achilles: Mild distal Achilles tendinopathy with linear accentuated signal in the distal tendon. Plantar Fascia: Prominently thickened medial band of the plantar fascia likely with internal ossifications and with abnormal internal signal indicating plantar fasciitis. Mild thickening of the proximal lateral band of the plantar fascia.  Plantar and Achilles calcaneal spurs noted. LIGAMENTS Lateral: Mildly thickened anterior inferior tibiofibular ligament without discontinuity. Edema along the anterior talofibular ligament but the ATFL appears intact. Medial: Unremarkable CARTILAGE Ankle Joint: 1.5 by 0.8 cm osteochondral lesion of the medial talar dome with mild cortical irregularity and underlying subcortical marrow edema, along with a focal chondral defect. Subtalar Joints/Sinus Tarsi: Spurring with mild associated subcortical marrow edema posteriorly along the posterior subtalar facet. Edema in the sinus tarsi but without ligamentous discontinuity to suggest sinus tarsi syndrome. Bones: Subcortical marrow edema along portions of the midfoot and Lisfranc joint although these appear generally associated with underlying substantial spurring and potential erosive arthropathy versus subcortical cysts, and accordingly I favor these as being related to arthropathy rather than osteomyelitis. Posterolaterally along the heel on images 15-20 of series 4, there appears to be a cutaneous ulceration or soft tissue defect overlying the distal lateral Achilles tendon. Subtle endosteal an adjacent marrow edema in the proximal calcaneus is most likely reactive, less likely to be due to early osteomyelitis. Other: Subcutaneous edema circumferentially around the ankle. IMPRESSION: 1. Cutaneous ulceration along the posterolateral ankle, with adjacent Achilles tendinopathy and some subtle marrow edema in the adjacent calcaneus which is more likely to be reactive and less likely to represent very early osteomyelitis. 2. Longitudinal tearing of the peroneus longus tendon. 3. Distal tibialis posterior tendinopathy, correlate clinically in assessing for tibialis posterior dysfunction. 4. Mild distal Achilles tendinopathy. 5. Proximal plantar fasciitis. 6. Edema along the anterior talofibular ligament but the ATFL does not appear torn. Mildly thickened anterior inferior  tibiofibular ligament without discontinuity. 7. Osteochondral lesion of the medial talar dome with focal chondral thinning and slight cortical irregularity along with subcortical marrow edema. 8. Considerable spurring and degenerative arthropathy in the midfoot and Lisfranc joint. Electronically Signed   By: Van Clines M.D.   On: 11/21/2020 17:23   CT Abdomen Pelvis W Contrast  Result Date: 11/16/2020 CLINICAL DATA:  Fever and abdominal pain. History of renal cell carcinoma. EXAM: CT ABDOMEN AND PELVIS WITH CONTRAST TECHNIQUE: Multidetector CT imaging of the abdomen and pelvis was performed using the standard protocol following bolus administration of intravenous contrast. CONTRAST:  132mL OMNIPAQUE IOHEXOL 300 MG/ML  SOLN COMPARISON:  Abdominal MRI 09/22/2020.  Abdominal CT 09/20/2020 FINDINGS: Lower chest: Normal heart size post TAVR. Mild breathing motion artifact at the lung bases scattered atelectasis. No confluent consolidation or pleural effusion. Hepatobiliary: No focal liver abnormality is seen. Distended gallbladder with layering stones/sludge. No obvious pericholecystic fat stranding inflammation, there is motion artifact through this region. No biliary dilatation. Pancreas: Fatty atrophy. No ductal dilatation or inflammation. Cystic pancreatic tail lesion on MRI is not well appreciated by CT. Spleen: Enlarged spanning approximately 15 mm cranial caudal, no focal splenic abnormality. 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 Normal right adrenal gland. There is mild right hydroureteronephrosis, with the ureter dilated to the bladder insertion. No evidence of stone or obstructing lesion. There is minimal prominence of the left ureter and renal pelvis. There is right greater than left perinephric edema. Areas of left  renal cortical scarring. Enhancing lesion arising from the lower left kidney measures 2 cm. Previous right renal cyst is  grossly stable. Motion artifact obscures detailed assessment. Urinary bladder is physiologically distended, question of bladder wall thickening Stomach/Bowel: Stomach is within normal limits. Appendix is not confidently visualized, no evidence of appendicitis. No evidence of bowel wall thickening, distention, or inflammatory changes. Moderate colonic stool burden. Vascular/Lymphatic: Aortic atherosclerosis. No aortic aneurysm. Small retroperitoneal lymph nodes are not enlarged by size criteria. Reproductive: Prostatic calcifications. Other: No free air.  No intra-abdominal abscess.  No ascites. Musculoskeletal: Diffuse degenerative disc disease and facet hypertrophy in the included spine. Moderate degenerative change of both hips. No acute osseous abnormality. IMPRESSION: 1. Mild right hydroureteronephrosis, with the ureter dilated to the bladder insertion. Mild prominence of the left ureter and renal pelvis. No evidence of stone or obstructing lesion. There is right greater than left perinephric edema. Findings are suspicious for urinary tract infection. 2. Enhancing 2 cm lesion arising from the lower left kidney, suspicious for renal cell carcinoma, unchanged in size from recent imaging. 3. Distended gallbladder with layering stones/sludge. No obvious pericholecystic fat stranding inflammation, there is motion artifact through this region. 4. Splenomegaly. 5. 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 6. Cystic pancreatic tail lesion on MRI is not well appreciated by CT. Aortic Atherosclerosis (ICD10-I70.0). Electronically Signed   By: Keith Rake M.D.   On: 11/16/2020 16:47   US ARTERIAL ABI (SCREENING LOWER EXTREMITY)  Result Date: 11/18/2020 CLINICAL DATA:  80 year old male with bilateral lower extremity wounds. EXAM: NONINVASIVE PHYSIOLOGIC VASCULAR STUDY OF BILATERAL LOWER EXTREMITIES TECHNIQUE: Evaluation of both lower extremities were performed at rest, including calculation of  ankle-brachial indices with single level Doppler, pressure and pulse volume recording. COMPARISON:  None. FINDINGS: Right ABI:  1.76 Left ABI:  1.67 Right Lower Extremity: Monophasic, dampened waveforms at the level of the ankle. Left Lower Extremity: Monophasic, dampened waveforms at the level of the ankle. IMPRESSION: Nondiagnostic ankle-brachial indices secondary to noncompressible vessels. Consider lower extremity arterial duplex for further characterization and evaluation for flow-limiting stenoses. Ruthann Cancer, MD [facility] Electronically Signed   By: Ruthann Cancer MD   On: 11/18/2020 09:41   DG CHEST PORT 1 VIEW  Result Date: 11/20/2020 CLINICAL DATA:  Shortness of breath EXAM: PORTABLE CHEST 1 VIEW COMPARISON:  11/16/2020 FINDINGS: Cardiac shadow is within normal limits. Status post TAVR. Lungs are well aerated bilaterally. Mild increased central vascular congestion is noted without significant parenchymal edema. No focal infiltrate is seen. No acute bony abnormality is noted. IMPRESSION: Mild central vascular congestion is noted. Electronically Signed   By: Inez Catalina M.D.   On: 11/20/2020 08:10   DG Chest Port 1 View  Result Date: 11/16/2020 CLINICAL DATA:  Fever with altered mental status EXAM: PORTABLE CHEST 1 VIEW COMPARISON:  September 24, 2020 FINDINGS: Lungs are clear. Heart size and pulmonary vascularity are normal. No adenopathy. Patient is status post aortic valve replacement. There is degenerative change in each shoulder. IMPRESSION: No edema or airspace opacity. Heart size within normal limits. Status post aortic valve replacement. Electronically Signed   By: Lowella Grip III M.D.   On: 11/16/2020 11:46   ECHOCARDIOGRAM COMPLETE  Result Date: 11/17/2020    ECHOCARDIOGRAM REPORT   Patient Name:   LLEWELLYN CHOPLIN Date of Exam: 11/17/2020 Medical Rec #:  [id number]    Height:       71.0 in Accession #:    [id number]  Weight:        240.0 lb Date of Birth:  October 30, 1940     BSA:          2.278 m Patient Age:    26 years     BP:           128/51 mmHg Patient Gender: M            HR:           64 bpm. Exam Location:  [facility] Procedure: 2D Echo, Cardiac Doppler and Color Doppler Indications:    Fever  History:        Patient has prior history of Echocardiogram examinations, most                 recent 11/14/2020. CHF, Aortic Valve Disease, Arrythmias:RBBB;                 Risk Factors:Hypertension, Diabetes and morbid obesity. Renal                 cell carcinoma, CKD.  Sonographer:    Dustin Flock RDCS Referring Phys: [place]  1. Left ventricular ejection fraction, by estimation, is 60 to 65%. The left ventricle has normal function. The left ventricle has no regional wall motion abnormalities. There is mild concentric left ventricular hypertrophy. Left ventricular diastolic parameters are consistent with Grade I diastolic dysfunction (impaired relaxation). Elevated left atrial pressure.  2. Right ventricular systolic function is normal. The right ventricular size is normal. Tricuspid regurgitation signal is inadequate for assessing PA pressure.  3. Left atrial size was moderately dilated.  4. The mitral valve is degenerative. Mild mitral valve regurgitation. Moderate mitral annular calcification.  5. The aortic valve has been repaired/replaced. Procedure Date: 09/25/2020. A 33mm Edwards Sapien TAVR valve is present and appears well seated with normal function by Doppler interrogation. Mean gradient 35mmHg, DOI 0.44. Trivial central AI without evidence of paravalvular leak. Comparison(s): Compared to prior TTE on 11/14/20, there is no significant change. AoV mean gradient remains at 13.3. Conclusion(s)/Recommendation(s): No evidence of valvular vegetations on this transthoracic echocardiogram. Would recommend a transesophageal echocardiogram to exclude infective endocarditis if clinically indicated. FINDINGS  Left  Ventricle: Left ventricular ejection fraction, by estimation, is 60 to 65%. The left ventricle has normal function. The left ventricle has no regional wall motion abnormalities. The left ventricular internal cavity size was normal in size. There is  mild concentric left ventricular hypertrophy. Left ventricular diastolic parameters are consistent with Grade I diastolic dysfunction (impaired relaxation). Elevated left atrial pressure. Right Ventricle: The right ventricular size is normal. No increase in right ventricular wall thickness. Right ventricular systolic function is normal. Tricuspid regurgitation signal is inadequate for assessing PA pressure. Left Atrium: Left atrial size was moderately dilated. Right Atrium: Right atrial size was normal in size. Pericardium: There is no evidence of pericardial effusion. Mitral Valve: The mitral valve is degenerative in appearance. There is moderate thickening of the mitral valve leaflet(s). There is moderate calcification of the mitral valve leaflet(s). Moderate mitral annular calcification. Mild mitral valve regurgitation. Tricuspid Valve: The tricuspid valve is normal in structure. Tricuspid valve regurgitation is trivial. Aortic Valve: A 62mm Edwards Sapien TAVR valve is present and appears well seated with normal function by Doppler interrogation. Mean gradient 71mmHg, DOI 0.44. Trivial central AI without evidence of paravalvular leak. The aortic valve has been repaired/replaced. Aortic valve regurgitation is trivial. Aortic valve mean gradient measures 13.0 mmHg. Aortic valve peak gradient  measures 25.2 mmHg. Aortic valve area, by VTI measures 2.17 cm. There is a 29 mm Sapien prosthetic, stented (TAVR) valve present in the aortic position. Pulmonic Valve: The pulmonic valve was normal in structure. Pulmonic valve regurgitation is not visualized. Aorta: The aortic root and ascending aorta are structurally normal, with no evidence of dilitation. IAS/Shunts: No atrial  level shunt detected by color flow Doppler.  LEFT VENTRICLE PLAX 2D LVIDd:         4.78 cm  Diastology LVIDs:         2.90 cm  LV e' medial:    5.00 cm/s LV PW:         1.35 cm  LV E/e' medial:  22.2 LV IVS:        1.39 cm  LV e' lateral:   7.40 cm/s LVOT diam:     2.50 cm  LV E/e' lateral: 15.0 LV SV:         110 LV SV Index:   48 LVOT Area:     4.91 cm  RIGHT VENTRICLE RV Basal diam:  4.74 cm RV S prime:     10.60 cm/s TAPSE (M-mode): 2.4 cm LEFT ATRIUM              Index       RIGHT ATRIUM           Index LA diam:        4.30 cm  1.89 cm/m  RA Area:     17.30 cm LA Vol (A2C):   110.0 ml 48.28 ml/m RA Volume:   51.30 ml  22.52 ml/m LA Vol (A4C):   93.9 ml  41.21 ml/m LA Biplane Vol: 103.0 ml 45.21 ml/m  AORTIC VALVE AV Area (Vmax):    2.17 cm AV Area (Vmean):   2.24 cm AV Area (VTI):     2.17 cm AV Vmax:           251.00 cm/s AV Vmean:          164.000 cm/s AV VTI:            0.507 m AV Peak Grad:      25.2 mmHg AV Mean Grad:      13.0 mmHg LVOT Vmax:         111.00 cm/s LVOT Vmean:        75.000 cm/s LVOT VTI:          0.224 m LVOT/AV VTI ratio: 0.44  AORTA Ao Root diam: 2.90 cm MITRAL VALVE MV Area (PHT): 2.03 cm     SHUNTS MV Decel Time: 373 msec     Systemic VTI:  0.22 m MV E velocity: 111.00 cm/s  Systemic Diam: 2.50 cm MV A velocity: 115.00 cm/s MV E/A ratio:  0.97 Gwyndolyn Kaufman MD Electronically signed by Gwyndolyn Kaufman MD Signature Date/Time: 11/17/2020/12:03:55 PM    Final    ECHOCARDIOGRAM COMPLETE  Result Date: 11/14/2020    ECHOCARDIOGRAM REPORT   Patient Name:   Brett Williamson Date of Exam: 11/14/2020 Medical Rec #:  [id number]    Height:       71.0 in Accession #:    [id number]   Weight:       240.5 lb Date of Birth:  1940-12-19     BSA:          2.281 m Patient Age:    44 years     BP:           150/69 mmHg Patient  Gender: M            HR:           71 bpm. Exam Location:  [facility] Procedure: 2D Echo, Cardiac Doppler and Color Doppler Indications:    Z95.2 (ICD-10-CM) - S/P TAVR  (transcatheter aortic valve                 replacement)  History:        Patient has prior history of Echocardiogram examinations, most                 recent 09/26/2020. CHF, Arrythmias:RBBB; Risk                 Factors:Hypertension and Diabetes. Sleep Apnea. CKD. Aortic                 Valve: 29 mm Edwards Sapien prosthetic, stented (TAVR) valve is                 present in the aortic position. Procedure Date:09/25/2020.                 Morbid obesity.  Sonographer:    Alvino Chapel RCS Referring Phys: [id number] [place]  1. Left ventricular ejection fraction, by estimation, is 65 to 70%. The left ventricle has normal function. The left ventricle has no regional wall motion abnormalities. There is moderate left ventricular hypertrophy. Left ventricular diastolic parameters are consistent with Grade I diastolic dysfunction (impaired relaxation). Elevated left atrial pressure.  2. Right ventricular systolic function is normal. The right ventricular size is normal.  3. Left atrial size was mildly dilated.  4. The mitral valve is normal in structure. Trivial mitral valve regurgitation. No evidence of mitral stenosis. Moderate mitral annular calcification.  5. Edwards Sapien 3 THV size 29 mm valve is in the AV position. . The aortic valve has been repaired/replaced. Aortic valve regurgitation is not visualized. No aortic stenosis is present. FINDINGS  Left Ventricle: Left ventricular ejection fraction, by estimation, is 65 to 70%. The left ventricle has normal function. The left ventricle has no regional wall motion abnormalities. The left ventricular internal cavity size was normal in size. There is  moderate left ventricular hypertrophy. Left ventricular diastolic parameters are consistent with Grade I diastolic dysfunction (impaired relaxation). Elevated left atrial pressure. Right Ventricle: The right ventricular size is normal. No increase in right ventricular wall thickness. Right  ventricular systolic function is normal. Left Atrium: Left atrial size was mildly dilated. Right Atrium: Right atrial size was normal in size. Pericardium: There is no evidence of pericardial effusion. Mitral Valve: The mitral valve is normal in structure. Moderate mitral annular calcification. Trivial mitral valve regurgitation. No evidence of mitral valve stenosis. MV peak gradient, 7.4 mmHg. The mean mitral valve gradient is 3.0 mmHg. Tricuspid Valve: The tricuspid valve is normal in structure. Tricuspid valve regurgitation is not demonstrated. No evidence of tricuspid stenosis. Aortic Valve: Edwards Sapien 3 THV size 29 mm valve is in the AV position. The aortic valve has been repaired/replaced. Aortic valve regurgitation is not visualized. No aortic stenosis is present. Aortic valve mean gradient measures 13.3 mmHg. Aortic valve peak gradient measures 26.2 mmHg. Aortic valve area, by VTI measures 1.40 cm. Pulmonic Valve: The pulmonic valve was not well visualized. Pulmonic valve regurgitation is mild. No evidence of pulmonic stenosis. Aorta: The aortic root is normal in size and structure. Pulmonary Artery: Indeterminant PASP, inadequate TR jet. Venous: The inferior vena cava  was not well visualized. IAS/Shunts: No atrial level shunt detected by color flow Doppler.  LEFT VENTRICLE PLAX 2D LVIDd:         4.80 cm  Diastology LVIDs:         3.10 cm  LV e' medial:    5.11 cm/s LV PW:         1.40 cm  LV E/e' medial:  19.2 LV IVS:        1.40 cm  LV e' lateral:   6.31 cm/s LVOT diam:     1.90 cm  LV E/e' lateral: 15.5 LV SV:         73 LV SV Index:   32 LVOT Area:     2.84 cm  RIGHT VENTRICLE RV S prime:     12.30 cm/s TAPSE (M-mode): 1.9 cm LEFT ATRIUM             Index       RIGHT ATRIUM           Index LA diam:        4.80 cm 2.10 cm/m  RA Area:     17.30 cm LA Vol (A2C):   97.2 ml 42.62 ml/m RA Volume:   47.90 ml  21.00 ml/m LA Vol (A4C):   62.0 ml 27.19 ml/m LA Biplane Vol: 80.2 ml 35.17 ml/m  AORTIC  VALVE AV Area (Vmax):    1.27 cm AV Area (Vmean):   1.32 cm AV Area (VTI):     1.40 cm AV Vmax:           256.00 cm/s AV Vmean:          162.333 cm/s AV VTI:            0.524 m AV Peak Grad:      26.2 mmHg AV Mean Grad:      13.3 mmHg LVOT Vmax:         115.00 cm/s LVOT Vmean:        75.300 cm/s LVOT VTI:          0.259 m LVOT/AV VTI ratio: 0.49  AORTA Ao Root diam: 3.30 cm MITRAL VALVE MV Area (PHT): 1.78 cm     SHUNTS MV Area VTI:   1.26 cm     Systemic VTI:  0.26 m MV Peak grad:  7.4 mmHg     Systemic Diam: 1.90 cm MV Mean grad:  3.0 mmHg MV Vmax:       1.36 m/s MV Vmean:      71.8 cm/s MV Decel Time: 427 msec MV E velocity: 98.00 cm/s MV A velocity: 129.00 cm/s MV E/A ratio:  0.76 Carlyle Dolly MD Electronically signed by Carlyle Dolly MD Signature Date/Time: 11/14/2020/4:43:28 PM    Final    ECHO TEE  Result Date: 11/20/2020    TRANSESOPHOGEAL ECHO REPORT   Patient Name:   Brett Williamson Date of Exam: 11/20/2020 Medical Rec #:  [id number]    Height:       71.0 in Accession #:    [id number]   Weight:       251.8 lb Date of Birth:  [birth year]     BSA:          2.325 m Patient Age:    61 years     BP:           141/60 mmHg Patient Gender: M            HR:  53 bpm. Exam Location:  [facility] Procedure: Transesophageal Echo Indications:    Bacteremia 790.7 / R78.81  History:        Patient has prior history of Echocardiogram examinations, most                 recent 11/17/2020. Risk Factors:Non-Smoker, Diabetes and                 Hypertension. RBBB, TAVR wtih a 29 mm Edwards S/P TAVR                 (transcatheter aortic valve replacement.  Sonographer:    [name] RDCS (AE) Referring Phys: Shannon: The transesophogeal probe was passed without difficulty through the esophogus of the patient. Sedation performed by performing physician. The patient developed no complications during the procedure. IMPRESSIONS  1. Left ventricular ejection fraction, by estimation, is  60 to 65%. The left ventricle has normal function.  2. Right ventricular systolic function is normal. The right ventricular size is normal.  3. Left atrial size was mildly dilated. No left atrial/left atrial appendage thrombus was detected. The LAA emptying velocity was 50 cm/s.  4. Right atrial size was mildly dilated.  5. The mitral valve is normal in structure. Trivial mitral valve regurgitation. No evidence of mitral stenosis.  6. A 51mm Edwards Sapien TAVR valve is in the AV position with normal function. . The aortic valve has been repaired/replaced. Aortic valve regurgitation is not visualized. No aortic stenosis is present. Conclusion(s)/Recommendation(s): No evidence of vegetation/infective endocarditis on this transesophageal echocardiogram. FINDINGS  Left Ventricle: Left ventricular ejection fraction, by estimation, is 60 to 65%. The left ventricle has normal function. The left ventricular internal cavity size was normal in size. Right Ventricle: The right ventricular size is normal. No increase in right ventricular wall thickness. Right ventricular systolic function is normal. Left Atrium: Left atrial size was mildly dilated. No left atrial/left atrial appendage thrombus was detected. The LAA emptying velocity was 50 cm/s. Right Atrium: Right atrial size was mildly dilated. Pericardium: There is no evidence of pericardial effusion. Mitral Valve: The mitral valve is normal in structure. Trivial mitral valve regurgitation. No evidence of mitral valve stenosis. Tricuspid Valve: The tricuspid valve is normal in structure. Tricuspid valve regurgitation is not demonstrated. No evidence of tricuspid stenosis. Aortic Valve: A 31mm Edwards Sapien TAVR valve is in the AV position with normal function. The aortic valve has been repaired/replaced. Aortic valve regurgitation is not visualized. No aortic stenosis is present. Pulmonic Valve: The pulmonic valve was not well visualized. Pulmonic valve regurgitation is  not visualized. No evidence of pulmonic stenosis. Aorta: The aortic root is normal in size and structure. IAS/Shunts: No atrial level shunt detected by color flow Doppler. Carlyle Dolly MD Electronically signed by Carlyle Dolly MD Signature Date/Time: 11/20/2020/11:36:39 AM    Final      CBC Recent Labs  Lab 11/16/20 1132 11/17/20 0507 11/18/20 4431 11/20/20 0406 11/22/20 0400  WBC 14.0* 13.6* 7.7 12.0* 14.4*  HGB 14.2 11.9* 11.9* 11.5* 12.4*  HCT 44.6 37.0* 37.6* 36.0* 39.5  PLT 123* 95* 80* 112* 161  MCV 81.1 81.9 81.7 81.8 82.1  MCH 25.8* 26.3 25.9* 26.1 25.8*  MCHC 31.8 32.2 31.6 31.9 31.4  RDW 18.4* 18.8* 19.0* 18.6* 18.2*  LYMPHSABS 0.3*  --   --   --   --   MONOABS 1.3*  --   --   --   --   EOSABS  0.0  --   --   --   --   BASOSABS 0.0  --   --   --   --     Chemistries  Recent Labs  Lab 11/16/20 1132 11/16/20 2300 11/17/20 0507 11/18/20 0608 11/20/20 0406 11/22/20 0400  NA 129* 130* 131* 130* 131* 130*  K 4.1 3.9 3.9 3.7 3.5 3.4*  CL 98 102 102 101 98 95*  CO2 19* 19* 21* 21* 24 26  GLUCOSE 252* 189* 174* 106* 131* 113*  BUN 36* 36* 37* 36* 29* 29*  CREATININE 1.44* 1.58* 1.55* 1.26* 1.18 1.17  CALCIUM 8.9 8.2* 8.1* 7.8* 8.0* 8.2*  AST 24  --   --   --   --   --   ALT 18  --   --   --   --   --   ALKPHOS 65  --   --   --   --   --   BILITOT 1.3*  --   --   --   --   --    ------------------------------------------------------------------------------------------------------------------ No results for input(s): CHOL, HDL, LDLCALC, TRIG, CHOLHDL, LDLDIRECT in the last 72 hours.  Lab Results  Component Value Date   HGBA1C 6.9 (H) 11/16/2020   ------------------------------------------------------------------------------------------------------------------ No results for input(s): TSH, T4TOTAL, T3FREE, THYROIDAB in the last 72 hours.  Invalid input(s):  FREET3 ------------------------------------------------------------------------------------------------------------------ No results for input(s): VITAMINB12, FOLATE, FERRITIN, TIBC, IRON, RETICCTPCT in the last 72 hours.  Coagulation profile Recent Labs  Lab 11/16/20 1132  INR 1.2    No results for input(s): DDIMER in the last 72 hours.  Cardiac Enzymes No results for input(s): CKMB, TROPONINI, MYOGLOBIN in the last 168 hours.  Invalid input(s): CK ------------------------------------------------------------------------------------------------------------------    Component Value Date/Time   BNP 1,561.0 (H) 09/12/2020 2122     Roxan Hockey M.D on 11/22/2020 at 5:55 PM  Go to [email or web address] - for contact info  [facility] - Office  [phone]

## 2020-11-22 NOTE — TOC Progression Note (Addendum)
Transition of Care [facility]) - Progression Note    Patient Details  Name: Brett Williamson MRN: [id number] Date of Birth: [birth year]  Transition of Care [employer]) CM/SW Contact  Natasha Bence, LCSW Phone Number: 11/22/2020, 11:08 AM  Clinical Narrative:    MD notified that patient will be requiring IV antibiotics. CSW referred patient to [facility]. Pam agreeable to take patient. CSW notified Vaughan Basta with [facility] in regards to patients new HHRN needs. Vaughan Basta agreeable to provide services. MD reported that patient's Granddaughter and daughter are on vacation and would not be back until Sunday for discharge. TOC to follow.   Addendum CSW attempted 3x to reach number provided by family for patient's grandson. Pam with [facility] reported that she has attempted 2x to reach grandson for IV antibiotics education. Pam reported that she also contacted patient's daughter to notify her of attempts to call and to request that patient's grandson be notified to answer call. CSW and Pam with [facility] not currently able to reach grandson for discharge with IV antibiotics. TOC to follow.   Expected Discharge Plan: [place] Barriers to Discharge: Continued Medical Work up  Expected Discharge Plan and Services Expected Discharge Plan: [place] In-house Referral: Clinical Social Work     Living arrangements for the past 2 months: Apartment                                       Social Determinants of Health (SDOH) Interventions    Readmission Risk Interventions Readmission Risk Prevention Plan 11/19/2020  Transportation Screening Complete  HRI or [place] Complete  Social Work Consult for [place] Planning/Counseling Complete  Medication Review Press photographer) Complete

## 2020-11-23 ENCOUNTER — Inpatient Hospital Stay (HOSPITAL_COMMUNITY): Payer: Medicare Other

## 2020-11-23 LAB — GLUCOSE, CAPILLARY
Glucose-Capillary: 133 mg/dL — ABNORMAL HIGH (ref 70–99)
Glucose-Capillary: 145 mg/dL — ABNORMAL HIGH (ref 70–99)
Glucose-Capillary: 120 mg/dL — ABNORMAL HIGH (ref 70–99)
Glucose-Capillary: 141 mg/dL — ABNORMAL HIGH (ref 70–99)

## 2020-11-23 LAB — CBC
HCT: 39 % (ref 39.0–52.0)
Hemoglobin: 12.6 g/dL — ABNORMAL LOW (ref 13.0–17.0)
MCH: 26.2 pg (ref 26.0–34.0)
MCHC: 32.3 g/dL (ref 30.0–36.0)
MCV: 81.1 fL (ref 80.0–100.0)
Platelets: 228 10*3/uL (ref 150–400)
RBC: 4.81 MIL/uL (ref 4.22–5.81)
RDW: 18 % — ABNORMAL HIGH (ref 11.5–15.5)
WBC: 15.9 10*3/uL — ABNORMAL HIGH (ref 4.0–10.5)
nRBC: 0 % (ref 0.0–0.2)

## 2020-11-23 LAB — BASIC METABOLIC PANEL
Anion gap: 9 (ref 5–15)
BUN: 28 mg/dL — ABNORMAL HIGH (ref 8–23)
CO2: 24 mmol/L (ref 22–32)
Calcium: 8.3 mg/dL — ABNORMAL LOW (ref 8.9–10.3)
Chloride: 97 mmol/L — ABNORMAL LOW (ref 98–111)
Creatinine, Ser: 0.93 mg/dL (ref 0.61–1.24)
GFR, Estimated: >60 mL/min (ref >60)
Glucose, Bld: 142 mg/dL — ABNORMAL HIGH (ref 70–99)
Potassium: 4.3 mmol/L (ref 3.5–5.1)
Sodium: 130 mmol/L — ABNORMAL LOW (ref 135–145)

## 2020-11-23 MED ORDER — TAMSULOSIN HCL 0.4 MG PO CAPS: 0.4000 mg | ORAL_CAPSULE | Freq: Every day | ORAL | 1 refills | Status: AC

## 2020-11-23 MED ORDER — SULFAMETHOXAZOLE-TRIMETHOPRIM 800-160 MG PO TABS
1.0000 | ORAL_TABLET | Freq: Two times a day (BID) | ORAL | Status: AC
  Administered 2020-11-23 – 2020-11-26 (×6): 1 via ORAL
  Filled 2020-11-23 (×7): qty 1

## 2020-11-23 MED ORDER — MUSCLE RUB 10-15 % EX CREA
TOPICAL_CREAM | CUTANEOUS | Status: DC | PRN
  Filled 2020-11-23: qty 85

## 2020-11-23 MED ORDER — AMLODIPINE BESYLATE 2.5 MG PO TABS: 2.5000 mg | ORAL_TABLET | Freq: Every day | ORAL | 2 refills | Status: AC

## 2020-11-23 MED ORDER — METHOCARBAMOL 500 MG PO TABS: 500.0000 mg | ORAL_TABLET | Freq: Three times a day (TID) | ORAL | 3 refills | Status: AC

## 2020-11-23 MED ORDER — ASPIRIN EC 81 MG PO TBEC: 81.0000 mg | DELAYED_RELEASE_TABLET | Freq: Every day | ORAL | 3 refills | Status: AC

## 2020-11-23 MED ORDER — MORPHINE SULFATE (PF) 2 MG/ML IV SOLN
2.0000 mg | Freq: Once | INTRAVENOUS | Status: AC
  Administered 2020-11-23: 2 mg via INTRAVENOUS
  Filled 2020-11-23: qty 1

## 2020-11-23 MED ORDER — SENNOSIDES-DOCUSATE SODIUM 8.6-50 MG PO TABS: 2.0000 | ORAL_TABLET | Freq: Every day | ORAL | 1 refills | Status: AC

## 2020-11-23 MED ORDER — CEFAZOLIN IV (FOR PTA / DISCHARGE USE ONLY): 2.0000 g | Freq: Three times a day (TID) | INTRAVENOUS | 0 refills | Status: AC

## 2020-11-23 MED ORDER — OXYCODONE-ACETAMINOPHEN 5-325 MG PO TABS: 1.0000 | ORAL_TABLET | ORAL | 0 refills | Status: DC | PRN

## 2020-11-23 MED ORDER — CARVEDILOL 3.125 MG PO TABS: 3.1250 mg | ORAL_TABLET | Freq: Two times a day (BID) | ORAL | 1 refills | Status: AC

## 2020-11-23 NOTE — Progress Notes (Signed)
Wound care completed. Patient tolerated very well.

## 2020-11-23 NOTE — TOC Progression Note (Signed)
Transition of Care [facility]) - Progression Note    Patient Details  Name: Brett Williamson MRN: [id number] Date of Birth: [birth year]  Transition of [facility]) CM/SW [place], LCSW Phone Number: 11/23/2020, 2:39 PM  Clinical Narrative:    Pam with [facility] reported that she was able to get in contact with the friend of the patient's grandson who reported that they are not able to come to the hospital for IV antibotics education due to not having transportation. TOC to follow.    Expected Discharge Plan: [place] Barriers to Discharge: Continued Medical Work up  Expected Discharge Plan and Services Expected Discharge Plan: [place] In-house Referral: Clinical Social Work     Living arrangements for the past 2 months: Apartment                                       Social Determinants of Health (SDOH) Interventions    Readmission Risk Interventions Readmission Risk Prevention Plan 11/19/2020  Transportation Screening Complete  HRI or [place] Complete  Social Work Consult for [place] Planning/Counseling Complete  Medication Review Press photographer) Complete

## 2020-11-23 NOTE — Discharge Instructions (Signed)
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  Apply thin layer of Eucerin Cream to LEs, top with ABD pad to Pretibial area.  Secure with Kerlix wrap and paper tape.  B)Wound care to bilateral heels:  Left Achilles region eschar and DTPI posterior heel and right heel healing DTPI:  Cleanse with soap and water, rinse and pat dry. Cover with xeroform gauze Kellie Simmering # 294), top with dry gauze and apply silicone heel foam dressing. Place feet into Prevalon pressure redistribution heel boots. Change twice daily  C)Wound care to ischemic areas on toes (right, digits 2-4):  Paint with betadine swabstick and allow to air dry.  No dressing.

## 2020-11-23 NOTE — Care Management Important Message (Signed)
Important Message  Patient Details  Name: Brett Williamson MRN: [id number] Date of Birth: 1940-12-04   Medicare Important Message Given:  Yes     Tommy Medal 11/23/2020, 12:00 PM

## 2020-11-23 NOTE — Progress Notes (Addendum)
Patient Demographics:    Brett Williamson, is a 80 y.o. male, DOB - [birth year], KTG:[id number]  Admit date - 11/16/2020   Admitting Physician Jakyren Fluegge Denton Brick, MD  Outpatient Primary MD for the patient is Neale Burly, MD  LOS - 7   Chief Complaint  Patient presents with  . Fever  . Altered Mental Status        Subjective:    Brett Williamson today has no fevers, no emesis,  No chest pain,   -No new complaints, resting comfortably -Back pain is not worse -   Assessment  & Plan :    Principal Problem:   Severe sepsis with acute organ dysfunction and Septic Shock  due to methicillin susceptible Staphylococcus aureus (MSSA) (HCC) Active Problems:   Essential hypertension   Acute lower UTI   Controlled type 2 diabetes mellitus with hyperglycemia, without long-term current use of insulin (HCC)   Chronic kidney disease (CKD), stage III (moderate) (HCC)   HTN (hypertension)   Chronic combined systolic and diastolic CHF (congestive heart failure) (HCC)   S/P TAVR (transcatheter aortic valve replacement)   Left kidney mass   Brief Summary:- 80 y.o.malewith a history of stage III chronic kidney disease, diabetes currently diet-controlled, hypertension, obesity, 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 Tommy Medal (Infectious Disease) recommends IV Ancef for 6 weeks last dose 12/27/2020  A/p 1)Severe Staph Aureus Sepsis with Septic Shock--- POA-on admission patient had fevers, tachycardia, tachypnea,  Leukocytosis, as well as elevated lactic acid and altered mentation and hypotension (hemodynamic  instability) -Weaned off  IV Levophed on 11/17/20  -Sepsis pathophysiology resolved (however leukocytosis persist) -Lactic acidosis resolved with IV fluids -blood cultures from 11/16/2020 with BCID 2/4 of anaerobic cx back GPC Staph Aureus MRSA Neg--- infected foot ulcers with cellulitis noted please see photos in epic -Repeat blood cultures from 11/17/2020 NGTD -switched from IV Rocephin to Ancef on 11/17/20 Discussed with Dr. Tommy Medal (Infectious Disease) recommends IV Ancef for 6 weeks last dose 12/27/2020 -TTE demonstrates TAVR without evidence of vegetations or endocarditis TEE on 11/20/20 --No evidence of endocarditis. Normal functioning prosthetic aortic valve -Antimicrobials this admission: Vancomycin4/8>>4/8 Cefepime 4/8>>4/8 Flagyl 4/8 >>4/8 Ceftriaxone 4/8>>4/9 Cefazolin 4/9>> -Appointment on 12/17/2020 with [name] at 2 pm  Microbiology results: 4/8BCx: GPC in anaerobic bottles both sets.BCID +MSSA -Repeat blood cultures from 11/17/2020 NGTD 4/8UCx: GNR 4/8 MRSA PCR is negative  2)CKD stage -3A     -Creatinine is down to 1.1 from 1.58 --renally adjust medications, avoid nephrotoxic agents / dehydration  / hypotension  3)H/o Rt sided subdural hematoma-in November 2021--- ICH was due to Trauma -status post decompression  4)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--patient will [facility]  5)S/p TAVR--- patient had aortic valve replacement on 09/25/2020- ---echo from 11/17/2020 shows functioning aortic valve without significant concerns  TEE as above #1 with normal functioning valve  6)Chronic diastolic dysfunction CHF--Echo from 11/17/2020 with EF of 60 to 65% and grade  1 diastolic CHF--- = Appears euvolemic, no evidence of CHF exacerbation at this time  7)DM2-A1c 6.9 reflecting excellent diabetic control PTA Use Novolog/Humalog Sliding scale insulin with Accu-Cheks/Fingersticks as ordered   8)Lower extremity pressure  ulcers/PAD--- with superimposed cellulitis, antibiotics as above #1 -Wound care consult noted -Please see photos in epic  MRI of left heel without definite evidence of osteo- -ABIs from 11/18/20 were nondiagnostic,  -Lt Lower Extremities arterial Duplex  with possible hemodynamically significant Stenosis-- need to see Dr.Early vascular surgeon in [place] as an outpatient after resolution of bacteremia/sepsis  9)social/ethics--palliative care consult appreciated, patient is a DNR  10)LBP--- -Lumbar x-rays without fractures, 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 -Lumbar MRI without definite evidence of discitis -Physical therapy advised, methocarbamol and opiates as advised  11)Generalized Weakness and Deconditioning/low back pain--physical therapist recommends SNF--- patient declines to go to SNF he would prefer to go home with home health -  12)Citrobacter freundii UTI--- urine culture from 11/16/2020 noted, received cefepime on 11/16/2020, will complete treatment with Bactrim--watch renal function closely   13) hyponatremia/hypokalemia--avoid excessive free water, potassium has been replaced  14)antibiotic therapy----patient with MSSA bacteremia, no endocarditis, however patient has a prosthetic valve, there is a concern for possible osteomyelitis and possible discitis---  -Discussed with Dr. Tommy Medal (Infectious Disease) recommends IV Ancef for 6 weeks last dose 12/27/2020  NB!! -Patient will need IV antibiotics at home through home health agency however he does not have for members at home who are able to help him with this at this time--- -his daughter and granddaughter are currently in [place] and  they will not be back here until Sunday 11/25/20--- PT OT team is working on alternative plan to try to discharge patient home however without a family member willing or able to do IV therapy at home this will be difficult  Discharge  Instructions:- 1)Possible  left renal cell carcinoma--patient will need [facility] as [facility]- -Please follow-up with Urologist Dr. Alyson Ingles in about --- for recheck and follow-up evaluation  in his office----[facility], [address], [place] [address] Phone Number----[phone]  2)Lt Lower Extremities with possible hemodynamically significant Stenosis-- you need to see Dr.Early vascular surgeon who comes to the [place] every Thursday  3)Iv Ancef 2 gm every 8 hours for bacteremia- Indication:  bacteremia First Dose: Yes Last Day of Therapy:  12/27/2020 Labs - Once weekly:  CBC/D and BMP, Labs - Every other week:  ESR and CRP  4)Wound Care- As noted above A)-Wound care to bilateral LEs at pretibial area:  Cleanse skin with soap and water, rinse and pat dry.  Apply thin layer of Eucerin Cream to LEs, top with ABD pad to Pretibial area.  Secure with Kerlix wrap and paper tape.  B)Wound care to bilateral heels:  Left Achilles region eschar and DTPI posterior heel and right heel healing DTPI:  Cleanse with soap and water, rinse and pat dry. Cover with xeroform gauze Kellie Simmering # [phone]), top with dry gauze and apply silicone heel foam dressing. Place feet into Prevalon pressure redistribution heel boots. Change twice daily  C)Wound care to ischemic areas on toes (right, digits 2-4):  Paint with betadine swabstick and allow to air dry.  No dressing.  Disposition/Need for in-Hospital Stay- patient unable to be discharged at this time due to severe staph sepsis/bacteremia requiring IV antibiotics  Status is: Inpatient  Remains inpatient appropriate because:please see above  Disposition:  The patient is from: Home  Anticipated d/c is to:  home with home health  Anticipated d/c date is: 2  Patient currently is not medically stable to d/c. Barriers: Not Clinically Stable-   Code Status :   Code Status:  DNR   Family Communication:    Discussed with daughter and grand-daughter, questions answered  Consults  :  ID  DVT Prophylaxis  :   - SCDs  SCDs Start: 11/16/20 2232/Lovenox    Lab Results  Component Value Date   PLT 228 11/23/2020    Inpatient Medications  Scheduled Meds: . amLODipine  2.5 mg Oral Daily  . carvedilol  3.125 mg Oral BID WC  . Chlorhexidine Gluconate Cloth  6 each Topical Daily  . enoxaparin (LOVENOX) injection  55 mg Subcutaneous Q24H  . hydrocerin   Topical BID  . insulin aspart  0-15 Units Subcutaneous TID AC & HS  . methocarbamol  500 mg Oral TID  . senna-docusate  2 tablet Oral QHS  . tamsulosin  0.4 mg Oral QPC supper   Continuous Infusions: . sodium chloride Stopped (11/22/20 1645)  .  ceFAZolin (ANCEF) IV 2 g (11/23/20 1516)   PRN Meds:.hydrALAZINE, Muscle Rub, ondansetron **OR** ondansetron (ZOFRAN) IV, oxyCODONE-acetaminophen    Anti-infectives (From admission, onward)   Start     Dose/Rate Route Frequency Ordered Stop   11/23/20 0000  ceFAZolin (ANCEF) IVPB        2 g Intravenous Every 8 hours 11/23/20 1626 12/28/20 2359   11/17/20 1715  ceFAZolin (ANCEF) IVPB 2g/100 mL premix        2 g 200 mL/hr over 30 Minutes Intravenous Every 8 hours 11/17/20 1616     11/17/20 1200  vancomycin (VANCOREADY) IVPB 1250 mg/250 mL  Status:  Discontinued        1,250 mg 166.7 mL/hr over 90 Minutes Intravenous Every 24 hours 11/16/20 1335 11/16/20 2231   11/17/20 1145  cefTRIAXone (ROCEPHIN) 2 g in sodium chloride 0.9 % 100 mL IVPB  Status:  Discontinued        2 g 200 mL/hr over 30 Minutes Intravenous Every 24 hours 11/16/20 2234 11/17/20 1559   11/17/20 0000  ceFEPIme (MAXIPIME) 2 g in sodium chloride 0.9 % 100 mL IVPB  Status:  Discontinued        2 g 200 mL/hr over 30 Minutes Intravenous Every 12 hours 11/16/20 1254 11/16/20 2231   11/16/20 2245  cefTRIAXone (ROCEPHIN) 2 g in sodium chloride 0.9 % 100 mL IVPB  Status:  Discontinued        2 g 200  mL/hr over 30 Minutes Intravenous Every 24 hours 11/16/20 2231 11/16/20 2234   11/16/20 1130  ceFEPIme (MAXIPIME) 2 g in sodium chloride 0.9 % 100 mL IVPB        2 g 200 mL/hr over 30 Minutes Intravenous  Once 11/16/20 1116 11/16/20 1447   11/16/20 1130  metroNIDAZOLE (FLAGYL) IVPB 500 mg        500 mg 100 mL/hr over 60 Minutes Intravenous  Once 11/16/20 1116 11/16/20 1416   11/16/20 1130  vancomycin (VANCOCIN) IVPB 1000 mg/200 mL premix  Status:  Discontinued        1,000 mg 200 mL/hr over 60 Minutes Intravenous  Once 11/16/20 1116 11/16/20 1126   11/16/20 1130  vancomycin (VANCOREADY) IVPB 2000 mg/400 mL        2,000 mg 200 mL/hr over 120 Minutes Intravenous  Once 11/16/20 1126 11/16/20 1417  Objective:   Vitals:   11/22/20 1638 11/22/20 2005 11/23/20 0350 11/23/20 1405  BP: (!) 153/63 (!) 150/48 138/63 (!) 109/50  Pulse: (!) 51 (!) 55 67 (!) 55  Resp:  20 20 18   Temp:  98 F (36.7 C) 98.6 F (37 C) 97.8 F (36.6 C)  TempSrc:    Oral  SpO2: 100% 97% 96% 95%  Weight:      Height:        Wt Readings from Last 3 Encounters:  11/21/20 112.3 kg  09/29/20 109.1 kg  07/03/20 116.1 kg     Intake/Output Summary (Last 24 hours) at 11/23/2020 1631 Last data filed at 11/23/2020 0746 Gross per 24 hour  Intake 745.42 ml  Output 2100 ml  Net -1354.58 ml     Physical Exam  Gen:- Awake, more awake, HEENT:- [place].AT, No sclera icterus Neck-Supple Neck,No JVD,.  Lungs-improving air movement, no wheezing  CV- S1, S2 normal, regular  Abd-  +ve B.Sounds, Abd Soft, No tenderness,    Extremity/Skin:-Please see photos in epic and below Psych-affect is appropriate, oriented x3 Neuro-generalized weakness, no new focal deficits, no tremors MSK-bilateral venous stasis dermatitis, decubitus ulcers of both heels with superimposed cellulitis and necrotic eschar -Right foot  with necrotic/gangrenous-looking toes   Data Review:   Micro Results Recent Results (from the past 240  hour(s))  Blood Culture (routine x 2)     Status: Abnormal   Collection Time: 11/16/20 11:34 AM   Specimen: BLOOD  Result Value Ref Range Status   Specimen Description   Final    BLOOD LEFT ANTECUBITAL Performed at [facility], [address] [place], [place] [address]    Special Requests   Final    Blood Culture adequate volume BOTTLES DRAWN AEROBIC AND ANAEROBIC Performed at [facility], [address], [place], [place] [address]    Culture  Setup Time   Final    GRAM POSITIVE COCCI ANAEROBIC BOTTLE ONLY Gram Stain Report Called to,Read Back By and Verified With: ELLER @ 0924 ON [id number] BY HENDERSON L. CRITICAL RESULT CALLED TO, READ BACK BY AND VERIFIED WITH: J,MENDENHALL PHARMD @1429  11/17/20 EB Performed at [facility], [place] [address], [place], [place] [address]    Culture STAPHYLOCOCCUS AUREUS (A)  Final   Report Status 11/19/2020 FINAL  Final   Organism ID, Bacteria STAPHYLOCOCCUS AUREUS  Final      Susceptibility   Staphylococcus aureus - MIC*    CIPROFLOXACIN >=8 RESISTANT Resistant     ERYTHROMYCIN <=0.25 SENSITIVE Sensitive     GENTAMICIN <=0.5 SENSITIVE Sensitive     OXACILLIN 0.5 SENSITIVE Sensitive     TETRACYCLINE <=1 SENSITIVE Sensitive     VANCOMYCIN <=0.5 SENSITIVE Sensitive     TRIMETH/SULFA <=10 SENSITIVE Sensitive     CLINDAMYCIN <=0.25 SENSITIVE Sensitive     RIFAMPIN <=0.5 SENSITIVE Sensitive     Inducible Clindamycin NEGATIVE Sensitive     * STAPHYLOCOCCUS AUREUS  Blood Culture ID Panel (Reflexed)     Status: Abnormal   Collection Time: 11/16/20 11:34 AM  Result Value Ref Range Status   Enterococcus faecalis NOT DETECTED NOT DETECTED Final   Enterococcus Faecium NOT DETECTED NOT DETECTED Final   Listeria monocytogenes NOT DETECTED NOT DETECTED Final   Staphylococcus species DETECTED (A) NOT DETECTED Final    Comment: CRITICAL RESULT CALLED TO, READ BACK BY AND VERIFIED WITH: J,MENDENHALL PHARMD @1429  11/17/20 EB    Staphylococcus aureus (BCID)  DETECTED (A) NOT DETECTED Final  Comment: CRITICAL RESULT CALLED TO, READ BACK BY AND VERIFIED WITH: J,MENDENHALL PHARMD @1429  11/17/20 EB    Staphylococcus epidermidis NOT DETECTED NOT DETECTED Final   Staphylococcus lugdunensis NOT DETECTED NOT DETECTED Final   Streptococcus species NOT DETECTED NOT DETECTED Final   Streptococcus agalactiae NOT DETECTED NOT DETECTED Final   Streptococcus pneumoniae NOT DETECTED NOT DETECTED Final   Streptococcus pyogenes NOT DETECTED NOT DETECTED Final   A.calcoaceticus-baumannii NOT DETECTED NOT DETECTED Final   Bacteroides fragilis NOT DETECTED NOT DETECTED Final   Enterobacterales NOT DETECTED NOT DETECTED Final   Enterobacter cloacae complex NOT DETECTED NOT DETECTED Final   Escherichia coli NOT DETECTED NOT DETECTED Final   Klebsiella aerogenes NOT DETECTED NOT DETECTED Final   Klebsiella oxytoca NOT DETECTED NOT DETECTED Final   Klebsiella pneumoniae NOT DETECTED NOT DETECTED Final   Proteus species NOT DETECTED NOT DETECTED Final   Salmonella species NOT DETECTED NOT DETECTED Final   Serratia marcescens NOT DETECTED NOT DETECTED Final   Haemophilus influenzae NOT DETECTED NOT DETECTED Final   Neisseria meningitidis NOT DETECTED NOT DETECTED Final   Pseudomonas aeruginosa NOT DETECTED NOT DETECTED Final   Stenotrophomonas maltophilia NOT DETECTED NOT DETECTED Final   Candida albicans NOT DETECTED NOT DETECTED Final   Candida auris NOT DETECTED NOT DETECTED Final   Candida glabrata NOT DETECTED NOT DETECTED Final   Candida krusei NOT DETECTED NOT DETECTED Final   Candida parapsilosis NOT DETECTED NOT DETECTED Final   Candida tropicalis NOT DETECTED NOT DETECTED Final   Cryptococcus neoformans/gattii NOT DETECTED NOT DETECTED Final   Meth resistant mecA/C and MREJ NOT DETECTED NOT DETECTED Final    Comment: Performed at [facility], [address] [place], [place] [address]  Blood Culture (routine x 2)     Status: Abnormal    Collection Time: 11/16/20 11:41 AM   Specimen: BLOOD  Result Value Ref Range Status   Specimen Description   Final    BLOOD BLOOD RIGHT ARM Performed at [facility], [address], [place], [place] [address]    Special Requests   Final    Blood Culture results may not be optimal due to an excessive volume of blood received in culture bottles BOTTLES DRAWN AEROBIC AND ANAEROBIC Performed at [facility], [address] [place], [place] [address]    Culture  Setup Time   Final    GRAM POSITIVE COCCI ANAEROBIC BOTTLE ONLY Gram Stain Report Called to,Read Back By and Verified With: ELLER @ 0349 ON [id number] BY HENDERSON L. aerobic bottle  GRAM POSITIVE COCCI Performed at [facility], [address] [place], [place] [address]    Culture (A)  Final    STAPHYLOCOCCUS AUREUS SUSCEPTIBILITIES PERFORMED ON PREVIOUS CULTURE WITHIN THE LAST 5 DAYS. Performed at [facility], [place] [address], [place], [place] [address]    Report Status 11/19/2020 FINAL  Final  Resp Panel by RT-PCR (Flu A&B, Covid) Nasopharyngeal Swab     Status: None   Collection Time: 11/16/20 11:49 AM   Specimen: Nasopharyngeal Swab; Nasopharyngeal(NP) swabs in vial transport medium  Result Value Ref Range Status   SARS Coronavirus 2 by RT PCR NEGATIVE NEGATIVE Final    Comment: (NOTE) SARS-CoV-2 target nucleic acids are NOT DETECTED.  The SARS-CoV-2 RNA is generally detectable in upper respiratory specimens during the acute phase of infection. The lowest concentration of SARS-CoV-2 viral copies this assay can detect is 138 copies/mL. A negative result does not preclude SARS-Cov-2 infection and should not  be used as the sole basis for treatment or other patient management decisions. A negative result may occur with  improper specimen collection/handling, submission of specimen other than nasopharyngeal swab, presence of viral mutation(s) within the areas targeted by this assay, and inadequate number of  viral copies(<138 copies/mL). A negative result must be combined with clinical observations, patient history, and epidemiological information. The expected result is Negative.  Fact Sheet for Patients:  EntrepreneurPulse.com.au  Fact Sheet for Healthcare Providers:  IncredibleEmployment.be  This test is no t yet approved or cleared by the [place] FDA and  has been authorized for detection and/or diagnosis of SARS-CoV-2 by FDA under an Emergency Use Authorization (EUA). This EUA will remain  in effect (meaning this test can be used) for the duration of the COVID-19 declaration under Section 564(b)(1) of the Act, 21 U.S.C.section 360bbb-3(b)(1), unless the authorization is terminated  or revoked sooner.       Influenza A by PCR NEGATIVE NEGATIVE Final   Influenza B by PCR NEGATIVE NEGATIVE Final    Comment: (NOTE) The Xpert Xpress SARS-CoV-2/FLU/RSV plus assay is intended as an aid in the diagnosis of influenza from Nasopharyngeal swab specimens and should not be used as a sole basis for treatment. Nasal washings and aspirates are unacceptable for Xpert Xpress SARS-CoV-2/FLU/RSV testing.  Fact Sheet for Patients: EntrepreneurPulse.com.au  Fact Sheet for Healthcare Providers: IncredibleEmployment.be  This test is not yet approved or cleared by the [place] FDA and has been authorized for detection and/or diagnosis of SARS-CoV-2 by FDA under an Emergency Use Authorization (EUA). This EUA will remain in effect (meaning this test can be used) for the duration of the COVID-19 declaration under Section 564(b)(1) of the Act, 21 U.S.C. section 360bbb-3(b)(1), unless the authorization is terminated or revoked.  Performed at [facility], [address] [place], [place] [address]   Urine culture     Status: Abnormal   Collection Time: 11/16/20 12:36 PM   Specimen: Urine, Catheterized  Result Value  Ref Range Status   Specimen Description   Final    URINE, CATHETERIZED Performed at [facility], [address], [place], [place] [address]    Special Requests   Final    NONE Performed at [facility], [address], [place],  [address]    Culture >=100,000 COLONIES/mL CITROBACTER FREUNDII (A)  Final   Report Status 11/19/2020 FINAL  Final   Organism ID, Bacteria CITROBACTER FREUNDII (A)  Final      Susceptibility   Citrobacter freundii - MIC*    CEFAZOLIN >=64 RESISTANT Resistant     CEFEPIME 0.25 SENSITIVE Sensitive     CEFTRIAXONE >=64 RESISTANT Resistant     CIPROFLOXACIN <=0.25 SENSITIVE Sensitive     GENTAMICIN <=1 SENSITIVE Sensitive     IMIPENEM <=0.25 SENSITIVE Sensitive     NITROFURANTOIN <=16 SENSITIVE Sensitive     TRIMETH/SULFA <=20 SENSITIVE Sensitive     PIP/TAZO >=128 RESISTANT Resistant     * >=100,000 COLONIES/mL CITROBACTER FREUNDII  MRSA PCR Screening     Status: None   Collection Time: 11/17/20  3:57 AM   Specimen: Nasal Mucosa; Nasopharyngeal  Result Value Ref Range Status   MRSA by PCR NEGATIVE NEGATIVE Final    Comment:        The GeneXpert MRSA Assay (FDA approved for NASAL specimens only), is one component of a comprehensive MRSA colonization surveillance program. It is not intended to diagnose MRSA infection nor to guide or monitor treatment for MRSA infections. Performed  at [facility], [address] [place] [place], [place] [address]   Culture, blood (Routine X 2) w Reflex to ID Panel     Status: None   Collection Time: 11/17/20  7:37 PM   Specimen: Left Antecubital; Blood  Result Value Ref Range Status   Specimen Description LEFT ANTECUBITAL  Final   Special Requests   Final    Blood Culture adequate volume BOTTLES DRAWN AEROBIC AND ANAEROBIC   Culture   Final    NO GROWTH 5 DAYS Performed at [facility], [address], [place],  [address]    Report Status 11/22/2020 FINAL  Final  Culture, blood (Routine X 2) w Reflex to ID  Panel     Status: None   Collection Time: 11/17/20  7:42 PM   Specimen: BLOOD RIGHT HAND  Result Value Ref Range Status   Specimen Description BLOOD RIGHT HAND  Final   Special Requests   Final    Blood Culture results may not be optimal due to an inadequate volume of blood received in culture bottles BOTTLES DRAWN AEROBIC AND ANAEROBIC   Culture   Final    NO GROWTH 5 DAYS Performed at [facility], [address], [place],  [address]    Report Status 11/22/2020 FINAL  Final    Radiology Reports DG Lumbar Spine 2-3 Views  Result Date: 11/20/2020 CLINICAL DATA:  Low back pain.  No known injury. EXAM: LUMBAR SPINE - 2-3 VIEW COMPARISON:  None. FINDINGS: There are 5 non rib-bearing lumbar type vertebral bodies. Mild scoliotic curvature of the thoracolumbar spine with dominant caudal component convex the right measuring approximately 7 degrees (as measured from the superior endplate of L1 to the inferior endplate of L4). There is straightening of the expected lumbar lordosis. No anterolisthesis or retrolisthesis. Lumbar vertebral body heights appear preserved. Moderate to severe multilevel lumbar spine DDD, worse at L2-L3, L3-L4 and L4-L5 with disc space height loss, endplate irregularity and small posteriorly directed disc osteophyte complexes at these locations. Limited visualization of the bilateral SI joints is normal. Large colonic stool burden without evidence of enteric obstruction. IMPRESSION: Moderate to severe multilevel lumbar spine DDD, worse at L2-L3, L3-L4 and L4-L5. Electronically Signed   By: Sandi Mariscal M.D.   On: 11/20/2020 15:39   MR LUMBAR SPINE WO CONTRAST  Result Date: 11/21/2020 CLINICAL DATA:  Low back pain.  Infection suspected. EXAM: MRI LUMBAR SPINE WITHOUT CONTRAST TECHNIQUE: Multiplanar, multisequence MR imaging of the lumbar spine was performed. No intravenous contrast was administered. COMPARISON:  None. FINDINGS: Segmentation: Standard segmentation is assumed.  The inferior-most fully formed intervertebral disc is labeled L5-S1. Alignment: No substantial sagittal subluxation. Broad dextrocurvature. Vertebrae: There is mild edema involving the L1-L2, L2-L3, L3-L4, and L4-L5 intervertebral discs; however, this is favored degenerative in etiology given there is minimal bone marrow edema and surrounding endplate signal changes appear characteristic of degenerative/Modic change. Diffuse T1 hypointensity of the marrow. Conus medullaris and cauda equina: Conus extends to the level. Conus and cauda equina appear normal. Paraspinal and other soft tissues: Mild STIR hyperintense paraspinous edema in the upper lumbar/lower thoracic spine. Disc levels: T12-L1: Disc bulging and mild facet hypertrophy without significant canal or foraminal stenosis. L1-L2: Broad disc bulge with superimposed central inferiorly dissecting disc protrusion. Moderate bilateral facet hypertrophy and ligamentum flavum thickening. Prominent dorsal epidural fat. Resulting mild to moderate canal stenosis. No significant foraminal stenosis. L2-L3: Disc height loss and desiccation. Broad disc bulge with moderate bilateral facet hypertrophy and ligamentum flavum thickening.  Prominent dorsal epidural fat. Resulting moderate canal stenosis and mild to moderate bilateral foraminal stenosis. L3-L4: Disc height loss and desiccation. Broad disc bulge with superimposed left subarticular disc protrusion. Resulting moderate to severe canal stenosis and severe left subarticular recess stenosis. Moderate left and mild-to-moderate right foraminal stenosis. L4-L5: Disc height loss and desiccation. Posterior disc bulge, severe bilateral facet hypertrophy. Resulting mild to moderate central canal stenosis with suspected bilateral subarticular recess narrowing. Evaluation is limited by motion at this level. Moderate to severe right greater than left foraminal stenosis. L5-S1: Broad disc bulge. Severe right and moderate left facet  hypertrophy. Mild bilateral foraminal stenosis. No significant canal stenosis. IMPRESSION: 1. Mild edema involving the L1-L2, L2-L3, L3-L4, and L4-L5 intervertebral discs and mild upper lumbar/lower thoracic paraspinal soft tissue edema, which is favored degenerative in etiology given severe multilevel degenerative change and given endplate signal changes appear more characteristic of degenerative/Modic change. Early discitis is not completely excluded and a short interval follow-up MRI (with contrast if possible) could ensure stability if clinically indicated. 2. Moderate to severe canal and severe left subarticular recess stenosis at L3-L4. Moderate canal stenosis at L2-L3 and mild to moderate canal stenosis at L1-L2 and L4-L5. Suspected bilateral subarticular recess narrowing at L4-L5. 3. Moderate to severe right greater than left foraminal stenosis at L4-L5. Moderate left foraminal stenosis at L3-L4. Mild to moderate foraminal stenosis on the right at L3-L4 and bilaterally at L2-L3. 4. Diffuse T1 hypointensity of the marrow, which is nonspecific but could relate to chronic anemia, chronic hypoxia (such as in smokers) or underlying lymphoproliferative disorder). Electronically Signed   By: Margaretha Sheffield MD   On: 11/21/2020 16:24   MR HEEL LEFT WO CONTRAST  Result Date: 11/21/2020 CLINICAL DATA:  Osteomyelitis of the foot EXAM: MR OF THE LEFT HEEL WITHOUT CONTRAST TECHNIQUE: Multiplanar, multisequence MR imaging of the ankle was performed. No intravenous contrast was administered. COMPARISON:  None. FINDINGS: TENDONS Peroneal: Longitudinal tearing of the peroneus longus tendon extending along a nearly 5 cm segment distal to the peroneal tubercle. Posteromedial: Small type 1 accessory navicular. Mild distal tibialis posterior tendinopathy, correlate clinically in assessing for tibialis posterior dysfunction. Mild flexor hallucis longus tenosynovitis just proximal to the knot of Henry. Anterior: Unremarkable  Achilles: Mild distal Achilles tendinopathy with linear accentuated signal in the distal tendon. Plantar Fascia: Prominently thickened medial band of the plantar fascia likely with internal ossifications and with abnormal internal signal indicating plantar fasciitis. Mild thickening of the proximal lateral band of the plantar fascia. Plantar and Achilles calcaneal spurs noted. LIGAMENTS Lateral: Mildly thickened anterior inferior tibiofibular ligament without discontinuity. Edema along the anterior talofibular ligament but the ATFL appears intact. Medial: Unremarkable CARTILAGE Ankle Joint: 1.5 by 0.8 cm osteochondral lesion of the medial talar dome with mild cortical irregularity and underlying subcortical marrow edema, along with a focal chondral defect. Subtalar Joints/Sinus Tarsi: Spurring with mild associated subcortical marrow edema posteriorly along the posterior subtalar facet. Edema in the sinus tarsi but without ligamentous discontinuity to suggest sinus tarsi syndrome. Bones: Subcortical marrow edema along portions of the midfoot and Lisfranc joint although these appear generally associated with underlying substantial spurring and potential erosive arthropathy versus subcortical cysts, and accordingly I favor these as being related to arthropathy rather than osteomyelitis. Posterolaterally along the heel on images 15-20 of series 4, there appears to be a cutaneous ulceration or soft tissue defect overlying the distal lateral Achilles tendon. Subtle endosteal an adjacent marrow edema in the proximal calcaneus is most likely  reactive, less likely to be due to early osteomyelitis. Other: Subcutaneous edema circumferentially around the ankle. IMPRESSION: 1. Cutaneous ulceration along the posterolateral ankle, with adjacent Achilles tendinopathy and some subtle marrow edema in the adjacent calcaneus which is more likely to be reactive and less likely to represent very early osteomyelitis. 2. Longitudinal  tearing of the peroneus longus tendon. 3. Distal tibialis posterior tendinopathy, correlate clinically in assessing for tibialis posterior dysfunction. 4. Mild distal Achilles tendinopathy. 5. Proximal plantar fasciitis. 6. Edema along the anterior talofibular ligament but the ATFL does not appear torn. Mildly thickened anterior inferior tibiofibular ligament without discontinuity. 7. Osteochondral lesion of the medial talar dome with focal chondral thinning and slight cortical irregularity along with subcortical marrow edema. 8. Considerable spurring and degenerative arthropathy in the midfoot and Lisfranc joint. Electronically Signed   By: Van Clines M.D.   On: 11/21/2020 17:23   CT Abdomen Pelvis W Contrast  Result Date: 11/16/2020 CLINICAL DATA:  Fever and abdominal pain. History of renal cell carcinoma. EXAM: CT ABDOMEN AND PELVIS WITH CONTRAST TECHNIQUE: Multidetector CT imaging of the abdomen and pelvis was performed using the standard protocol following bolus administration of intravenous contrast. CONTRAST:  167m OMNIPAQUE IOHEXOL 300 MG/ML  SOLN COMPARISON:  Abdominal MRI 09/22/2020.  Abdominal CT 09/20/2020 FINDINGS: Lower chest: Normal heart size post TAVR. Mild breathing motion artifact at the lung bases scattered atelectasis. No confluent consolidation or pleural effusion. Hepatobiliary: No focal liver abnormality is seen. Distended gallbladder with layering stones/sludge. No obvious pericholecystic fat stranding inflammation, there is motion artifact through this region. No biliary dilatation. Pancreas: Fatty atrophy. No ductal dilatation or inflammation. Cystic pancreatic tail lesion on MRI is not well appreciated by CT. Spleen: Enlarged spanning approximately 15 mm cranial caudal, no focal splenic abnormality. 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 Normal right adrenal gland. There is mild right hydroureteronephrosis, with the  ureter dilated to the bladder insertion. No evidence of stone or obstructing lesion. There is minimal prominence of the left ureter and renal pelvis. There is right greater than left perinephric edema. Areas of left renal cortical scarring. Enhancing lesion arising from the lower left kidney measures 2 cm. Previous right renal cyst is grossly stable. Motion artifact obscures detailed assessment. Urinary bladder is physiologically distended, question of bladder wall thickening Stomach/Bowel: Stomach is within normal limits. Appendix is not confidently visualized, no evidence of appendicitis. No evidence of bowel wall thickening, distention, or inflammatory changes. Moderate colonic stool burden. Vascular/Lymphatic: Aortic atherosclerosis. No aortic aneurysm. Small retroperitoneal lymph nodes are not enlarged by size criteria. Reproductive: Prostatic calcifications. Other: No free air.  No intra-abdominal abscess.  No ascites. Musculoskeletal: Diffuse degenerative disc disease and facet hypertrophy in the included spine. Moderate degenerative change of both hips. No acute osseous abnormality. IMPRESSION: 1. Mild right hydroureteronephrosis, with the ureter dilated to the bladder insertion. Mild prominence of the left ureter and renal pelvis. No evidence of stone or obstructing lesion. There is right greater than left perinephric edema. Findings are suspicious for urinary tract infection. 2. Enhancing 2 cm lesion arising from the lower left kidney, suspicious for renal cell carcinoma, unchanged in size from recent imaging. 3. Distended gallbladder with layering stones/sludge. No obvious pericholecystic fat stranding inflammation, there is motion artifact through this region. 4. Splenomegaly. 5. 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 6. Cystic pancreatic tail lesion on MRI is not well appreciated by CT. Aortic Atherosclerosis (ICD10-I70.0). Electronically Signed   By: MKeith Rake  M.D.   On:  11/16/2020 16:47   US ARTERIAL ABI (SCREENING LOWER EXTREMITY)  Result Date: 11/18/2020 CLINICAL DATA:  80 year old male with bilateral lower extremity wounds. EXAM: NONINVASIVE PHYSIOLOGIC VASCULAR STUDY OF BILATERAL LOWER EXTREMITIES TECHNIQUE: Evaluation of both lower extremities were performed at rest, including calculation of ankle-brachial indices with single level Doppler, pressure and pulse volume recording. COMPARISON:  None. FINDINGS: Right ABI:  1.76 Left ABI:  1.67 Right Lower Extremity: Monophasic, dampened waveforms at the level of the ankle. Left Lower Extremity: Monophasic, dampened waveforms at the level of the ankle. IMPRESSION: Nondiagnostic ankle-brachial indices secondary to noncompressible vessels. Consider lower extremity arterial duplex for further characterization and evaluation for flow-limiting stenoses. Ruthann Cancer, MD [facility] Radiology Electronically Signed   By: Ruthann Cancer MD   On: 11/18/2020 09:41   US ARTERIAL LOWER EXTREMITY DUPLEX BILATERAL  Result Date: 11/23/2020 CLINICAL DATA:  80 year old male with concern for peripheral artery disease. EXAM: BILATERAL LOWER EXTREMITY ARTERIAL DUPLEX SCAN TECHNIQUE: Gray-scale sonography as well as color Doppler and duplex ultrasound was performed to evaluate the arteries of both lower extremities including the common, superficial and profunda femoral arteries, popliteal artery and calf arteries. COMPARISON:  ABI from 11/18/2020 FINDINGS: Right Lower Extremity ABI: Not obtained. Inflow: Normal common femoral arterial waveforms and velocities. No evidence of inflow (aortoiliac) disease. Outflow: Normal profunda femoral, superficial femoral and popliteal arterial waveforms and velocities. No focal elevation of the PSV to suggest stenosis. Runoff: Normal posterior and anterior tibial arterial waveforms and velocities proximally. Obscuration of the distal posterior tibial and anterior  tibial arteries due to overlying bandaging. Left Lower Extremity ABI: Not obtained. Inflow: Normal common femoral arterial waveforms and velocities. No evidence of inflow (aortoiliac) disease. Outflow: Normal profunda femoral, superficial femoral and popliteal arterial waveforms and velocities. No focal elevation of the PSV to suggest stenosis. Runoff: Normal posterior and anterior tibial arterial waveforms and velocities proximally. Focal elevation of the mid anterior tibial artery. Obscuration of the distal posterior tibial and anterior tibial arteries due to overlying bandaging. IMPRESSION: 1. Focal elevation in the mid left anterior tibial artery which could indicate hemodynamically significant stenosis. The distal bilateral tibial arteries are obscured due to overlying bandaging. 2. Widely patent bilateral inflow and outflow vessels. Ruthann Cancer, MD [facility] Radiology Electronically Signed   By: Ruthann Cancer MD   On: 11/23/2020 11:55   DG CHEST PORT 1 VIEW  Result Date: 11/20/2020 CLINICAL DATA:  Shortness of breath EXAM: PORTABLE CHEST 1 VIEW COMPARISON:  11/16/2020 FINDINGS: Cardiac shadow is within normal limits. Status post TAVR. Lungs are well aerated bilaterally. Mild increased central vascular congestion is noted without significant parenchymal edema. No focal infiltrate is seen. No acute bony abnormality is noted. IMPRESSION: Mild central vascular congestion is noted. Electronically Signed   By: Inez Catalina M.D.   On: 11/20/2020 08:10   DG Chest Port 1 View  Result Date: 11/16/2020 CLINICAL DATA:  Fever with altered mental status EXAM: PORTABLE CHEST 1 VIEW COMPARISON:  September 24, 2020 FINDINGS: Lungs are clear. Heart size and pulmonary vascularity are normal. No adenopathy. Patient is status post aortic valve replacement. There is degenerative change in each shoulder. IMPRESSION: No edema or airspace opacity. Heart size within normal  limits. Status post aortic valve replacement. Electronically Signed   By: Lowella Grip III M.D.   On: 11/16/2020 11:46   ECHOCARDIOGRAM COMPLETE  Result Date: 11/17/2020    ECHOCARDIOGRAM REPORT  Patient Name:   ZUBIN PONTILLO Date of Exam: 11/17/2020 Medical Rec #:  [id number]    Height:       71.0 in Accession #:    [id number]   Weight:       240.0 lb Date of Birth:  12-05-1940     BSA:          2.278 m Patient Age:    31 years     BP:           128/51 mmHg Patient Gender: M            HR:           64 bpm. Exam Location:  [facility] Procedure: 2D Echo, Cardiac Doppler and Color Doppler Indications:    Fever  History:        Patient has prior history of Echocardiogram examinations, most                 recent 11/14/2020. CHF, Aortic Valve Disease, Arrythmias:RBBB;                 Risk Factors:Hypertension, Diabetes and morbid obesity. Renal                 cell carcinoma, CKD.  Sonographer:    Dustin Flock RDCS Referring Phys: [place]  1. Left ventricular ejection fraction, by estimation, is 60 to 65%. The left ventricle has normal function. The left ventricle has no regional wall motion abnormalities. There is mild concentric left ventricular hypertrophy. Left ventricular diastolic parameters are consistent with Grade I diastolic dysfunction (impaired relaxation). Elevated left atrial pressure.  2. Right ventricular systolic function is normal. The right ventricular size is normal. Tricuspid regurgitation signal is inadequate for assessing PA pressure.  3. Left atrial size was moderately dilated.  4. The mitral valve is degenerative. Mild mitral valve regurgitation. Moderate mitral annular calcification.  5. The aortic valve has been repaired/replaced. Procedure Date: 09/25/2020. A 106m Edwards Sapien TAVR valve is present and appears well seated with normal function by Doppler interrogation. Mean gradient 139mg, DOI 0.44. Trivial central AI without evidence of paravalvular leak.  Comparison(s): Compared to prior TTE on 11/14/20, there is no significant change. AoV mean gradient remains at 13.3. Conclusion(s)/Recommendation(s): No evidence of valvular vegetations on this transthoracic echocardiogram. Would recommend a transesophageal echocardiogram to exclude infective endocarditis if clinically indicated. FINDINGS  Left Ventricle: Left ventricular ejection fraction, by estimation, is 60 to 65%. The left ventricle has normal function. The left ventricle has no regional wall motion abnormalities. The left ventricular internal cavity size was normal in size. There is  mild concentric left ventricular hypertrophy. Left ventricular diastolic parameters are consistent with Grade I diastolic dysfunction (impaired relaxation). Elevated left atrial pressure. Right Ventricle: The right ventricular size is normal. No increase in right ventricular wall thickness. Right ventricular systolic function is normal. Tricuspid regurgitation signal is inadequate for assessing PA pressure. Left Atrium: Left atrial size was moderately dilated. Right Atrium: Right atrial size was normal in size. Pericardium: There is no evidence of pericardial effusion. Mitral Valve: The mitral valve is degenerative in appearance. There is moderate thickening of the mitral valve leaflet(s). There is moderate calcification of the mitral valve leaflet(s). Moderate mitral annular calcification. Mild mitral valve regurgitation. Tricuspid Valve: The tricuspid valve is normal in structure. Tricuspid valve regurgitation is trivial. Aortic Valve: A 2995mdwards Sapien TAVR valve is present and appears well seated with normal function by Doppler interrogation. Mean  gradient 21mHg, DOI 0.44. Trivial central AI without evidence of paravalvular leak. The aortic valve has been repaired/replaced. Aortic valve regurgitation is trivial. Aortic valve mean gradient measures 13.0 mmHg. Aortic valve peak gradient measures 25.2 mmHg. Aortic valve  area, by VTI measures 2.17 cm. There is a 29 mm Sapien prosthetic, stented (TAVR) valve present in the aortic position. Pulmonic Valve: The pulmonic valve was normal in structure. Pulmonic valve regurgitation is not visualized. Aorta: The aortic root and ascending aorta are structurally normal, with no evidence of dilitation. IAS/Shunts: No atrial level shunt detected by color flow Doppler.  LEFT VENTRICLE PLAX 2D LVIDd:         4.78 cm  Diastology LVIDs:         2.90 cm  LV e' medial:    5.00 cm/s LV PW:         1.35 cm  LV E/e' medial:  22.2 LV IVS:        1.39 cm  LV e' lateral:   7.40 cm/s LVOT diam:     2.50 cm  LV E/e' lateral: 15.0 LV SV:         110 LV SV Index:   48 LVOT Area:     4.91 cm  RIGHT VENTRICLE RV Basal diam:  4.74 cm RV S prime:     10.60 cm/s TAPSE (M-mode): 2.4 cm LEFT ATRIUM              Index       RIGHT ATRIUM           Index LA diam:        4.30 cm  1.89 cm/m  RA Area:     17.30 cm LA Vol (A2C):   110.0 ml 48.28 ml/m RA Volume:   51.30 ml  22.52 ml/m LA Vol (A4C):   93.9 ml  41.21 ml/m LA Biplane Vol: 103.0 ml 45.21 ml/m  AORTIC VALVE AV Area (Vmax):    2.17 cm AV Area (Vmean):   2.24 cm AV Area (VTI):     2.17 cm AV Vmax:           251.00 cm/s AV Vmean:          164.000 cm/s AV VTI:            0.507 m AV Peak Grad:      25.2 mmHg AV Mean Grad:      13.0 mmHg LVOT Vmax:         111.00 cm/s LVOT Vmean:        75.000 cm/s LVOT VTI:          0.224 m LVOT/AV VTI ratio: 0.44  AORTA Ao Root diam: 2.90 cm MITRAL VALVE MV Area (PHT): 2.03 cm     SHUNTS MV Decel Time: 373 msec     Systemic VTI:  0.22 m MV E velocity: 111.00 cm/s  Systemic Diam: 2.50 cm MV A velocity: 115.00 cm/s MV E/A ratio:  0.97 HGwyndolyn KaufmanMD Electronically signed by HGwyndolyn KaufmanMD Signature Date/Time: 11/17/2020/12:03:55 PM    Final    ECHOCARDIOGRAM COMPLETE  Result Date: 11/14/2020    ECHOCARDIOGRAM REPORT   Patient Name:   JNOWELL SITESDate of Exam: 11/14/2020 Medical Rec #:  [id number]   Height:        71.0 in Accession #:    [id number]  Weight:       240.5 lb Date of Birth:  3May 13, 1942    BSA:  2.281 m Patient Age:    36 years     BP:           150/69 mmHg Patient Gender: M            HR:           71 bpm. 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 CHF, Arrythmias:RBBB; Risk                 Factors:Hypertension and Diabetes. Sleep Apnea. CKD. Aortic                 Valve: 29 mm Edwards Sapien prosthetic, stented (TAVR) valve is                 present in the aortic position. Procedure Date:09/25/2020.                 Morbid obesity.  Sonographer:    Alvino Chapel RCS Referring Phys: [id number] [place]  1. Left ventricular ejection fraction, by estimation, is 65 to 70%. The left ventricle has normal function. The left ventricle has no regional wall motion abnormalities. There is moderate left ventricular hypertrophy. Left ventricular diastolic parameters are consistent with Grade I diastolic dysfunction (impaired relaxation). Elevated left atrial pressure.  2. Right ventricular systolic function is normal. The right ventricular size is normal.  3. Left atrial size was mildly dilated.  4. The mitral valve is normal in structure. Trivial mitral valve regurgitation. No evidence of mitral stenosis. Moderate mitral annular calcification.  5. Edwards Sapien 3 THV size 29 mm valve is in the AV position. . The aortic valve has been repaired/replaced. Aortic valve regurgitation is not visualized. No aortic stenosis is present. FINDINGS  Left Ventricle: Left ventricular ejection fraction, by estimation, is 65 to 70%. The left ventricle has normal function. The left ventricle has no regional wall motion abnormalities. The left ventricular  internal cavity size was normal in size. There is  moderate left ventricular hypertrophy. Left ventricular diastolic parameters are consistent with Grade I diastolic dysfunction (impaired relaxation). Elevated left atrial pressure. Right Ventricle: The right ventricular size is normal. No increase in right ventricular wall thickness. Right ventricular systolic function is normal. Left Atrium: Left atrial size was mildly dilated. Right Atrium: Right atrial size was normal in size. Pericardium: There is no evidence of pericardial effusion. Mitral Valve: The mitral valve is normal in structure. Moderate mitral annular calcification. Trivial mitral valve regurgitation. No evidence of mitral valve stenosis. MV peak gradient, 7.4 mmHg. The mean mitral valve gradient is 3.0 mmHg. Tricuspid Valve: The tricuspid valve is normal in structure. Tricuspid valve regurgitation is not demonstrated. No evidence of tricuspid stenosis. Aortic Valve: Edwards Sapien 3 THV size 29 mm valve is in the AV position. The aortic valve has been repaired/replaced. Aortic valve regurgitation is not visualized. No aortic stenosis is present. Aortic valve mean gradient measures 13.3 mmHg. Aortic valve peak gradient measures 26.2 mmHg. Aortic valve area, by VTI measures 1.40 cm. Pulmonic Valve: The pulmonic valve was not well visualized. Pulmonic valve regurgitation is mild. No  evidence of pulmonic stenosis. Aorta: The aortic root is normal in size and structure. Pulmonary Artery: Indeterminant PASP, inadequate TR jet. Venous: The inferior vena cava was not well visualized. IAS/Shunts: No atrial level shunt detected by color flow Doppler.  LEFT VENTRICLE PLAX 2D LVIDd:         4.80 cm  Diastology LVIDs:         3.10 cm  LV e' medial:    5.11 cm/s LV PW:         1.40 cm  LV E/e' medial:  19.2 LV IVS:        1.40 cm  LV e' lateral:   6.31 cm/s LVOT diam:     1.90 cm  LV E/e' lateral: 15.5 LV SV:         73 LV SV Index:   32 LVOT Area:     2.84 cm   RIGHT VENTRICLE RV S prime:     12.30 cm/s TAPSE (M-mode): 1.9 cm LEFT ATRIUM             Index       RIGHT ATRIUM           Index LA diam:        4.80 cm 2.10 cm/m  RA Area:     17.30 cm LA Vol (A2C):   97.2 ml 42.62 ml/m RA Volume:   47.90 ml  21.00 ml/m LA Vol (A4C):   62.0 ml 27.19 ml/m LA Biplane Vol: 80.2 ml 35.17 ml/m  AORTIC VALVE AV Area (Vmax):    1.27 cm AV Area (Vmean):   1.32 cm AV Area (VTI):     1.40 cm AV Vmax:           256.00 cm/s AV Vmean:          162.333 cm/s AV VTI:            0.524 m AV Peak Grad:      26.2 mmHg AV Mean Grad:      13.3 mmHg LVOT Vmax:         115.00 cm/s LVOT Vmean:        75.300 cm/s LVOT VTI:          0.259 m LVOT/AV VTI ratio: 0.49  AORTA Ao Root diam: 3.30 cm MITRAL VALVE MV Area (PHT): 1.78 cm     SHUNTS MV Area VTI:   1.26 cm     Systemic VTI:  0.26 m MV Peak grad:  7.4 mmHg     Systemic Diam: 1.90 cm MV Mean grad:  3.0 mmHg MV Vmax:       1.36 m/s MV Vmean:      71.8 cm/s MV Decel Time: 427 msec MV E velocity: 98.00 cm/s MV A velocity: 129.00 cm/s MV E/A ratio:  0.76 Carlyle Dolly MD Electronically signed by Carlyle Dolly MD Signature Date/Time: 11/14/2020/4:43:28 PM    Final    ECHO TEE  Result Date: 11/20/2020    TRANSESOPHOGEAL ECHO REPORT   Patient Name:   MAXIME BECKNER Date of Exam: 11/20/2020 Medical Rec #:  [id number]    Height:       71.0 in Accession #:    [id number]   Weight:       251.8 lb Date of Birth:  04-Feb-1941     BSA:          2.325 m Patient Age:    86 years     BP:  141/60 mmHg Patient Gender: M            HR:           53 bpm. [facility]am Location[facility] Procedure: Transesophageal Echo Indications:    Bacteremia 790.7 / R78.81  History:        Patient has prior history of Echocardiogram examinations, most                 recent 11/17/2020. Risk Factors:Non-Smoker, Diabetes and                 Hypertension. RBBB, TAVR wtih a 29 mm Edwards S/P TAVR                 (transcatheter aortic valve replacement.  Sonographer:    [name] RDCS (AE) Referring Phys: [place]: The transesophogeal probe was passed without difficulty through the esophogus of the patient. Sedation performed by performing physician. The patient developed no complications during the procedure. IMPRESSIONS  1. Left ventricular ejection fraction, by estimation, is 60 to 65%. The left ventricle has normal function.  2. Right ventricular systolic function is normal. The right ventricular size is normal.  3. Left atrial size was mildly dilated. No left atrial/left atrial appendage thrombus was detected. The LAA emptying velocity was 50 cm/s.  4. Right atrial size was mildly dilated.  5. The mitral valve is normal in structure. Trivial mitral valve regurgitation. No evidence of mitral stenosis.  6. 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 . The aortic valve has been repaired/replaced. Aortic valve regurgitation is not visualized. No aortic stenosis is present. Conclusion(s)/Recommendation(s): No evidence of vegetation/infective endocarditis on this transesophageal echocardiogram. FINDINGS  Left Ventricle: Left ventricular ejection fraction, by estimation, is 60 to 65%. The left ventricle has normal function. The left ventricular internal cavity size was normal in size. Right Ventricle: The right ventricular size is normal. No increase in right ventricular wall thickness. Right ventricular systolic function is normal. Left Atrium: Left atrial size was mildly dilated. No left atrial/left atrial appendage thrombus was detected. The LAA emptying velocity was 50 cm/s. Right Atrium: Right atrial size was mildly dilated. Pericardium: There is no evidence of pericardial effusion. Mitral Valve: The mitral valve is normal in structure. Trivial mitral valve regurgitation. No evidence of mitral valve stenosis. Tricuspid Valve: The tricuspid valve is normal in structure. Tricuspid valve regurgitation is not  demonstrated. No evidence of tricuspid stenosis. Aortic Valve: A 259mEdwards Sapien TAVR valve is in the AV position with normal function. The aortic valve has been repaired/replaced. Aortic valve regurgitation is not visualized. No aortic stenosis is present. Pulmonic Valve: The pulmonic valve was not well visualized. Pulmonic valve regurgitation is not visualized. No evidence of pulmonic stenosis. Aorta: The aortic root is normal in size and structure. IAS/Shunts: No atrial level shunt detected by color flow Doppler. JoCarlyle DollyD Electronically signed by JoCarlyle DollyD Signature Date/Time: 11/20/2020/11:36:39 AM    Final      CBC Recent Labs  Lab 11/17/20 0507 11/18/20 0670014/12/22 0406 11/22/20 0400 11/23/20 0422  WBC 13.6* 7.7 12.0* 14.4* 15.9*  HGB 11.9* 11.9* 11.5* 12.4* 12.6*  HCT 37.0* 37.6* 36.0* 39.5 39.0  PLT 95* 80* 112* 161 228  MCV 81.9 81.7 81.8 82.1 81.1  MCH 26.3 25.9* 26.1 25.8* 26.2  MCHC 32.2 31.6 31.9 31.4 32.3  RDW 18.8* 19.0* 18.6* 18.2* 18.0*    Chemistries  Recent Labs  Lab 11/17/20 0507 11/18/20 0608 11/20/20 0406 11/22/20 0400 11/23/20 0422  NA 131* 130* 131* 130* 130*  K 3.9 3.7 3.5 3.4* 4.3  CL 102 101 98 95* 97*  CO2 21* 21* 24 26 24   GLUCOSE 174* 106* 131* 113* 142*  BUN 37* 36* 29* 29* 28*  CREATININE 1.55* 1.26* 1.18 1.17 0.93  CALCIUM 8.1* 7.8* 8.0* 8.2* 8.3*   ------------------------------------------------------------------------------------------------------------------ No results for input(s): CHOL, HDL, LDLCALC, TRIG, CHOLHDL, LDLDIRECT in the last 72 hours.  Lab Results  Component Value Date   HGBA1C 6.9 (H) 11/16/2020   ------------------------------------------------------------------------------------------------------------------ No results for input(s): TSH, T4TOTAL, T3FREE, THYROIDAB in the last 72 hours.  Invalid input(s):  FREET3 ------------------------------------------------------------------------------------------------------------------ No results for input(s): VITAMINB12, FOLATE, FERRITIN, TIBC, IRON, RETICCTPCT in the last 72 hours.  Coagulation profile No results for input(s): INR, PROTIME in the last 168 hours.  No results for input(s): DDIMER in the last 72 hours.  Cardiac Enzymes No results for input(s): CKMB, TROPONINI, MYOGLOBIN in the last 168 hours.  Invalid input(s): CK ------------------------------------------------------------------------------------------------------------------    Component Value Date/Time   BNP 1,561.0 (H) 09/12/2020 1969     Roxan Hockey M.D on 11/23/2020 at 4:31 PM  Go to [email or web address] - for contact info  [facility] - Office  [phone]

## 2020-11-24 LAB — GLUCOSE, CAPILLARY
Glucose-Capillary: 124 mg/dL — ABNORMAL HIGH (ref 70–99)
Glucose-Capillary: 164 mg/dL — ABNORMAL HIGH (ref 70–99)
Glucose-Capillary: 139 mg/dL — ABNORMAL HIGH (ref 70–99)
Glucose-Capillary: 131 mg/dL — ABNORMAL HIGH (ref 70–99)

## 2020-11-24 NOTE — Progress Notes (Addendum)
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 Patient was ready to be discharged but family members are on vacation and they will be no one to receive him at home.  He would need IV antibiotics Ancef at home until Dec 27, 2020 per infectious disease recommendation Patient seen and examined at bedside denies any new complaints  Assessment/Plan: Principal Problem:   Severe sepsis with acute organ dysfunction and Septic Shock  due to methicillin susceptible Staphylococcus aureus (MSSA) ([place]) Active Problems:   Essential hypertension   Acute lower UTI   Controlled type 2 diabetes mellitus with hyperglycemia, without long-term current use of insulin (HCC)   Chronic kidney disease (CKD), stage III (moderate) (HCC)   HTN (hypertension)   Chronic combined systolic and diastolic CHF (congestive heart failure) (HCC)   S/P TAVR (transcatheter aortic valve replacement)   Left kidney mass  A/p 1) severe staph aureus sepsis with septic shock- POA-on admission patient had fevers, tachycardia, tachypnea,  Leukocytosis, as well as elevated lactic acid and altered mentation and hypotension (hemodynamic instability) on admission he required Levophed for pressure support -Weaned off  IV Levophed on 11/17/20  -Sepsis pathophysiology resolved -Lactic acidosis resolved with IV fluids -blood cultures from 11/16/2020 with BCID 2/4 of anaerobic cx back GPC Staph Aureus MRSA Neg--- infected foot ulcers with cellulitis noted please see photos in epic -Repeat blood cultures from 11/17/2020 NGTD He was switched from IV Rocephin to Ancef on November 17, 2020 per recommendation of the infectious disease. -TTE demonstrates TAVR without evidence of vegetations or endocarditis TEE on 11/20/20 --No evidence of endocarditis. Normal functioning prosthetic aortic valve -Antimicrobials this admission: Vancomycin 4/8 >> 4/8 Cefepime 4/8 >> 4/8 Flagyl 4/8 >>4/8 Ceftriaxone 4/8>>4/9 Cefazolin 4/9>>  -Appointment on 12/17/2020 with [name] at 2 pm  Microbiology results: 4/8 BCx: GPC in anaerobic bottles both sets. BCID +MSSA -Repeat blood cultures from 11/17/2020 NGTD 4/8 UCx: GNR 4/8 MRSA PCR is negative Patient will follow up with Dr. Drucilla Schmidt infectious disease after antibiotics as outpatient  2.  Acute lower  urinary tract infection.  Continue antibiotic as above  3.  Chronic kidney disease stage III A.  Continue to monitor creatinine and avoid hypotension and dehydration  4.  Possible renal cell carcinoma patient will need to be [facility] as [facility]  5.  Valvular heart disease SP TAVR.  TEE was normal  6.  Chronic diastolic dysfunction echocardiogram on November 17, 2020 showed EF of 60 to 65% with grade 1 diastolic congestive heart failure  Code Status: DNR  Severity of Illness: The appropriate patient status for this patient is INPATIENT. Inpatient status is judged to be reasonable and necessary in order to provide the required intensity of service to ensure the patient's safety. The patient's presenting symptoms, physical exam findings, and initial radiographic and laboratory data in the context of their chronic comorbidities is felt to place them at high risk for further clinical deterioration. Furthermore, it is not anticipated that the patient will be medically stable for discharge from the hospital within 2 midnights of admission. The following factors support the patient status of inpatient.   Patient still  requiring IV antibiotics and needing to be discharged home to family who are not available he is likely to  be discharged tomorrow or Monday  * I certify that at the point of admission it is my clinical judgment that the patient will require inpatient hospital care spanning beyond 2 midnights from the point of admission due to high intensity of service, high risk for further deterioration and high frequency of surveillance required.*    Family Communication: None at bedside  Disposition Plan: Home with IV antibiotics   Consultants:  Infectious disease  Procedures:  TEE  Venous Doppler  Antimicrobials:  Ancef currently  See above for antibiotic history  DVT prophylaxis: Lovenox   Objective: Vitals:   11/23/20 1405 11/23/20 2113 11/24/20 0530 11/24/20 0902  BP: (!) 109/50 (!) 144/65 (!) 133/57 134/65  Pulse: (!) 55 (!) 56 66 61  Resp: 18 18 18    Temp: 97.8 F (36.6 C) 99 F (37.2 C) 98.1 F (36.7 C)   TempSrc: Oral Oral Oral   SpO2: 95% 96% 92%   Weight:      Height:        Intake/Output Summary (Last 24 hours) at 11/24/2020 0933 Last data filed at 11/24/2020 0835 Gross per 24 hour  Intake 720 ml  Output 1150 ml  Net -430 ml   Filed Weights   11/20/20 0434 11/20/20 0855 11/21/20 0500  Weight: 114.2 kg 114.2 kg 112.3 kg   Body mass index is 34.53 kg/m.  Exam:   General: 81 y.o. year-old male well developed well nourished in no acute distress.  Alert and oriented x3.  Obese  Cardiovascular: Regular rate and rhythm with no rubs or gallops.  No thyromegaly or JVD noted.    Respiratory: Clear to auscultation with no wheezes or rales. Good inspiratory effort.  Abdomen: Soft nontender nondistended with normal bowel sounds x4 quadrants.  Musculoskeletal:has lower extremity edema.  Skin: has wound and  ulcerative lesions noted bilateral LE  Psychiatry: Mood is appropriate for condition and setting   Data Reviewed: CBC: Recent Labs  Lab 11/18/20 0608 11/20/20 0406 11/22/20 0400 11/23/20 0422  WBC 7.7 12.0* 14.4* 15.9*  HGB 11.9* 11.5* 12.4* 12.6*   HCT 37.6* 36.0* 39.5 39.0  MCV 81.7 81.8 82.1 81.1  PLT 80* 112* 161 161   Basic Metabolic Panel: Recent Labs  Lab 11/18/20 0608 11/20/20 0406 11/22/20 0400 11/23/20 0422  NA 130* 131* 130* 130*  K 3.7 3.5 3.4* 4.3  CL 101 98 95* 97*  CO2 21* 24 26 24   GLUCOSE 106* 131* 113* 142*  BUN 36* 29* 29* 28*  CREATININE 1.26* 1.18 1.17 0.93  CALCIUM 7.8* 8.0* 8.2* 8.3*  PHOS  --  2.5  --   --    GFR: Estimated Creatinine Clearance: 79.4 mL/min (by C-G formula based on SCr of 0.93 mg/dL). Liver Function Tests: Recent Labs  Lab 11/20/20 0406  ALBUMIN 2.5*   No results for input(s): LIPASE, AMYLASE in the last 168 hours. No results for input(s): AMMONIA in the last 168 hours. Coagulation Profile: No results for input(s): INR, PROTIME in the last 168 hours. Cardiac Enzymes: No results for input(s): CKTOTAL, CKMB, CKMBINDEX, TROPONINI in the last 168 hours. BNP (last 3 results) No results for input(s): PROBNP in the last 8760 hours. HbA1C: No results for input(s): HGBA1C in the last 72 hours. CBG: Recent Labs  Lab 11/23/20 0746 11/23/20 1142 11/23/20 1651 11/23/20 2111 11/24/20 0901  GLUCAP 133* 145* 120* 141* 124*  Lipid Profile: No results for input(s): CHOL, HDL, LDLCALC, TRIG, CHOLHDL, LDLDIRECT in the last 72 hours. Thyroid Function Tests: No results for input(s): TSH, T4TOTAL, FREET4, T3FREE, THYROIDAB in the last 72 hours. Anemia Panel: No results for input(s): VITAMINB12, FOLATE, FERRITIN, TIBC, IRON, RETICCTPCT in the last 72 hours. Urine analysis:    Component Value Date/Time   COLORURINE YELLOW 11/16/2020 1235   APPEARANCEUR HAZY (A) 11/16/2020 1235   LABSPEC 1.012 11/16/2020 1235   PHURINE 5.0 11/16/2020 1235   GLUCOSEU >=500 (A) 11/16/2020 1235   HGBUR MODERATE (A) 11/16/2020 1235   BILIRUBINUR NEGATIVE 11/16/2020 1235   KETONESUR 5 (A) 11/16/2020 1235   PROTEINUR 100 (A) 11/16/2020 1235   NITRITE POSITIVE (A) 11/16/2020 1235   LEUKOCYTESUR LARGE  (A) 11/16/2020 1235   Sepsis Labs: @LABRCNTIP (procalcitonin:4,lacticidven:4)  ) Recent Results (from the past 240 hour(s))  Blood Culture (routine x 2)     Status: Abnormal   Collection Time: 11/16/20 11:34 AM   Specimen: BLOOD  Result Value Ref Range Status   Specimen Description   Final    BLOOD LEFT ANTECUBITAL Performed at [facility], [address] [place], [place] [address]    Special Requests   Final    Blood Culture adequate volume BOTTLES DRAWN AEROBIC AND ANAEROBIC Performed at [facility], [address] [place], [place] [address]    Culture  Setup Time   Final    GRAM POSITIVE COCCI ANAEROBIC BOTTLE ONLY Gram Stain Report Called to,Read Back By and Verified With: ELLER @ 0160 ON [id number] BY HENDERSON L. CRITICAL RESULT CALLED TO, READ BACK BY AND VERIFIED WITH: J,MENDENHALL PHARMD @1429  11/17/20 EB Performed at [facility], [place] [address], [place], [place] [address]    Culture STAPHYLOCOCCUS AUREUS (A)  Final   Report Status 11/19/2020 FINAL  Final   Organism ID, Bacteria STAPHYLOCOCCUS AUREUS  Final      Susceptibility   Staphylococcus aureus - MIC*    CIPROFLOXACIN >=8 RESISTANT Resistant     ERYTHROMYCIN <=0.25 SENSITIVE Sensitive     GENTAMICIN <=0.5 SENSITIVE Sensitive     OXACILLIN 0.5 SENSITIVE Sensitive     TETRACYCLINE <=1 SENSITIVE Sensitive     VANCOMYCIN <=0.5 SENSITIVE Sensitive     TRIMETH/SULFA <=10 SENSITIVE Sensitive     CLINDAMYCIN <=0.25 SENSITIVE Sensitive     RIFAMPIN <=0.5 SENSITIVE Sensitive     Inducible Clindamycin NEGATIVE Sensitive     * STAPHYLOCOCCUS AUREUS  Blood Culture ID Panel (Reflexed)     Status: Abnormal   Collection Time: 11/16/20 11:34 AM  Result Value Ref Range Status   Enterococcus faecalis NOT DETECTED NOT DETECTED Final   Enterococcus Faecium NOT DETECTED NOT DETECTED Final   Listeria monocytogenes NOT DETECTED NOT DETECTED Final   Staphylococcus species DETECTED (A) NOT DETECTED Final    Comment: CRITICAL  RESULT CALLED TO, READ BACK BY AND VERIFIED WITH: J,MENDENHALL PHARMD @1429  11/17/20 EB    Staphylococcus aureus (BCID) DETECTED (A) NOT DETECTED Final    Comment: CRITICAL RESULT CALLED TO, READ BACK BY AND VERIFIED WITH: J,MENDENHALL PHARMD @1429  11/17/20 EB    Staphylococcus epidermidis NOT DETECTED NOT DETECTED Final   Staphylococcus lugdunensis NOT DETECTED NOT DETECTED Final   Streptococcus species NOT DETECTED NOT DETECTED Final   Streptococcus agalactiae NOT DETECTED NOT DETECTED Final   Streptococcus pneumoniae NOT DETECTED NOT DETECTED Final   Streptococcus pyogenes NOT DETECTED NOT DETECTED Final   A.calcoaceticus-baumannii NOT DETECTED NOT DETECTED Final   Bacteroides fragilis NOT DETECTED  NOT DETECTED Final   Enterobacterales NOT DETECTED NOT DETECTED Final   Enterobacter cloacae complex NOT DETECTED NOT DETECTED Final   Escherichia coli NOT DETECTED NOT DETECTED Final   Klebsiella aerogenes NOT DETECTED NOT DETECTED Final   Klebsiella oxytoca NOT DETECTED NOT DETECTED Final   Klebsiella pneumoniae NOT DETECTED NOT DETECTED Final   Proteus species NOT DETECTED NOT DETECTED Final   Salmonella species NOT DETECTED NOT DETECTED Final   Serratia marcescens NOT DETECTED NOT DETECTED Final   Haemophilus influenzae NOT DETECTED NOT DETECTED Final   Neisseria meningitidis NOT DETECTED NOT DETECTED Final   Pseudomonas aeruginosa NOT DETECTED NOT DETECTED Final   Stenotrophomonas maltophilia NOT DETECTED NOT DETECTED Final   Candida albicans NOT DETECTED NOT DETECTED Final   Candida auris NOT DETECTED NOT DETECTED Final   Candida glabrata NOT DETECTED NOT DETECTED Final   Candida krusei NOT DETECTED NOT DETECTED Final   Candida parapsilosis NOT DETECTED NOT DETECTED Final   Candida tropicalis NOT DETECTED NOT DETECTED Final   Cryptococcus neoformans/gattii NOT DETECTED NOT DETECTED Final   Meth resistant mecA/C and MREJ NOT DETECTED NOT DETECTED Final    Comment: Performed at [facility], [place] [address], [place], [place] [address]  Blood Culture (routine x 2)     Status: Abnormal   Collection Time: 11/16/20 11:41 AM   Specimen: BLOOD  Result Value Ref Range Status   Specimen Description   Final    BLOOD BLOOD RIGHT ARM Performed at [facility], [address], [place], [place] [address]    Special Requests   Final    Blood Culture results may not be optimal due to an excessive volume of blood received in culture bottles BOTTLES DRAWN AEROBIC AND ANAEROBIC Performed at [facility], [address], [place], [place] [address]    Culture  Setup Time   Final    GRAM POSITIVE COCCI ANAEROBIC BOTTLE ONLY Gram Stain Report Called to,Read Back By and Verified With: ELLER @ 1540 ON [id number] BY HENDERSON L. aerobic bottle  GRAM POSITIVE COCCI Performed at [facility], [address] [place], [place] [address]    Culture (A)  Final    STAPHYLOCOCCUS AUREUS SUSCEPTIBILITIES PERFORMED ON PREVIOUS CULTURE WITHIN THE LAST 5 DAYS. Performed at [facility], [place] [address], [place], [place] [address]    Report Status 11/19/2020 FINAL  Final  Resp Panel by RT-PCR (Flu A&B, Covid) Nasopharyngeal Swab     Status: None   Collection Time: 11/16/20 11:49 AM   Specimen: Nasopharyngeal Swab; Nasopharyngeal(NP) swabs in vial transport medium  Result Value Ref Range Status   SARS Coronavirus 2 by RT PCR NEGATIVE NEGATIVE Final    Comment: (NOTE) SARS-CoV-2 target nucleic acids are NOT DETECTED.  The SARS-CoV-2 RNA is generally detectable in upper respiratory specimens during the acute phase of infection. The lowest concentration of SARS-CoV-2 viral copies this assay can detect is 138 copies/mL. A negative result does not preclude SARS-Cov-2 infection and should not be used as the sole basis for treatment or other patient management decisions. A negative result may occur with  improper specimen collection/handling, submission of specimen other than nasopharyngeal swab,  presence of viral mutation(s) within the areas targeted by this assay, and inadequate number of viral copies(<138 copies/mL). A negative result must be combined with clinical observations, patient history, and epidemiological information. The expected result is Negative.  Fact Sheet for Patients:  EntrepreneurPulse.com.au  Fact Sheet for Healthcare Providers:  IncredibleEmployment.be  This  test is no t yet approved or cleared by the [place] and  has been authorized for detection and/or diagnosis of SARS-CoV-2 by FDA under an Emergency Use Authorization (EUA). This EUA will remain  in effect (meaning this test can be used) for the duration of the COVID-19 declaration under Section 564(b)(1) of the Act, 21 U.S.C.section 360bbb-3(b)(1), unless the authorization is terminated  or revoked sooner.       Influenza A by PCR NEGATIVE NEGATIVE Final   Influenza B by PCR NEGATIVE NEGATIVE Final    Comment: (NOTE) The Xpert Xpress SARS-CoV-2/FLU/RSV plus assay is intended as an aid in the diagnosis of influenza from Nasopharyngeal swab specimens and should not be used as a sole basis for treatment. Nasal washings and aspirates are unacceptable for Xpert Xpress SARS-CoV-2/FLU/RSV testing.  Fact Sheet for Patients: EntrepreneurPulse.com.au  Fact Sheet for Healthcare Providers: IncredibleEmployment.be  This test is not yet approved or cleared by the [place] FDA and has been authorized for detection and/or diagnosis of SARS-CoV-2 by FDA under an Emergency Use Authorization (EUA). This EUA will remain in effect (meaning this test can be used) for the duration of the COVID-19 declaration under Section 564(b)(1) of the Act, 21 U.S.C. section 360bbb-3(b)(1), unless the authorization is terminated or revoked.  Performed at [facility], [address], [place], [place] [address]   Urine culture     Status:  Abnormal   Collection Time: 11/16/20 12:36 PM   Specimen: Urine, Catheterized  Result Value Ref Range Status   Specimen Description   Final    URINE, CATHETERIZED Performed at [facility], [address] [place], [place] [address]    Special Requests   Final    NONE Performed at [facility], [address] [place], [place] [address]    Culture >=100,000 COLONIES/mL CITROBACTER FREUNDII (A)  Final   Report Status 11/19/2020 FINAL  Final   Organism ID, Bacteria CITROBACTER FREUNDII (A)  Final      Susceptibility   Citrobacter freundii - MIC*    CEFAZOLIN >=64 RESISTANT Resistant     CEFEPIME 0.25 SENSITIVE Sensitive     CEFTRIAXONE >=64 RESISTANT Resistant     CIPROFLOXACIN <=0.25 SENSITIVE Sensitive     GENTAMICIN <=1 SENSITIVE Sensitive     IMIPENEM <=0.25 SENSITIVE Sensitive     NITROFURANTOIN <=16 SENSITIVE Sensitive     TRIMETH/SULFA <=20 SENSITIVE Sensitive     PIP/TAZO >=128 RESISTANT Resistant     * >=100,000 COLONIES/mL CITROBACTER FREUNDII  MRSA PCR Screening     Status: None   Collection Time: 11/17/20  3:57 AM   Specimen: Nasal Mucosa; Nasopharyngeal  Result Value Ref Range Status   MRSA by PCR NEGATIVE NEGATIVE Final    Comment:        The GeneXpert MRSA Assay (FDA approved for NASAL specimens only), is one component of a comprehensive MRSA colonization surveillance program. It is not intended to diagnose MRSA infection nor to guide or monitor treatment for MRSA infections. Performed at [facility], [address], [place], [place] [address]   Culture, blood (Routine X 2) w Reflex to ID Panel     Status: None   Collection Time: 11/17/20  7:37 PM   Specimen: Left Antecubital; Blood  Result Value Ref Range Status   Specimen Description LEFT ANTECUBITAL  Final   Special Requests   Final    Blood Culture adequate volume BOTTLES DRAWN AEROBIC AND ANAEROBIC   Culture   Final    NO  GROWTH 5 DAYS Performed at [facility], [address], [place], [place]  [address]    Report Status 11/22/2020 FINAL  Final  Culture, blood (Routine X 2) w Reflex to ID Panel     Status: None   Collection Time: 11/17/20  7:42 PM   Specimen: BLOOD RIGHT HAND  Result Value Ref Range Status   Specimen Description BLOOD RIGHT HAND  Final   Special Requests   Final    Blood Culture results may not be optimal due to an inadequate volume of blood received in culture bottles BOTTLES DRAWN AEROBIC AND ANAEROBIC   Culture   Final    NO GROWTH 5 DAYS Performed at [facility], [address], [place], [place] [address]    Report Status 11/22/2020 FINAL  Final      Studies: US ARTERIAL LOWER EXTREMITY DUPLEX BILATERAL  Result Date: 11/23/2020 CLINICAL DATA:  80 year old male with concern for peripheral artery disease. EXAM: BILATERAL LOWER EXTREMITY ARTERIAL DUPLEX SCAN TECHNIQUE: Gray-scale sonography as well as color Doppler and duplex ultrasound was performed to evaluate the arteries of both lower extremities including the common, superficial and profunda femoral arteries, popliteal artery and calf arteries. COMPARISON:  ABI from 11/18/2020 FINDINGS: Right Lower Extremity ABI: Not obtained. Inflow: Normal common femoral arterial waveforms and velocities. No evidence of inflow (aortoiliac) disease. Outflow: Normal profunda femoral, superficial femoral and popliteal arterial waveforms and velocities. No focal elevation of the PSV to suggest stenosis. Runoff: Normal posterior and anterior tibial arterial waveforms and velocities proximally. Obscuration of the distal posterior tibial and anterior tibial arteries due to overlying bandaging. Left Lower Extremity ABI: Not obtained. Inflow: Normal common femoral arterial waveforms and velocities. No evidence of inflow (aortoiliac) disease. Outflow: Normal profunda femoral, superficial femoral and popliteal arterial waveforms and velocities. No focal elevation of the PSV to suggest stenosis. Runoff: Normal posterior and anterior tibial  arterial waveforms and velocities proximally. Focal elevation of the mid anterior tibial artery. Obscuration of the distal posterior tibial and anterior tibial arteries due to overlying bandaging. IMPRESSION: 1. Focal elevation in the mid left anterior tibial artery which could indicate hemodynamically significant stenosis. The distal bilateral tibial arteries are obscured due to overlying bandaging. 2. Widely patent bilateral inflow and outflow vessels. Ruthann Cancer, MD [facility] Electronically Signed   By: Ruthann Cancer MD   On: 11/23/2020 11:55    Scheduled Meds: . amLODipine  2.5 mg Oral Daily  . carvedilol  3.125 mg Oral BID WC  . Chlorhexidine Gluconate Cloth  6 each Topical Daily  . enoxaparin (LOVENOX) injection  55 mg Subcutaneous Q24H  . hydrocerin   Topical BID  . insulin aspart  0-15 Units Subcutaneous TID AC & HS  . methocarbamol  500 mg Oral TID  . senna-docusate  2 tablet Oral QHS  . sulfamethoxazole-trimethoprim  1 tablet Oral Q12H  . tamsulosin  0.4 mg Oral QPC supper    [facility] Inf[facility]: . sodium chloride Stopped (11/22/20 1645)  .  ceFAZolin (ANCEF) IV 2 g (11/24/20 0630)     LOS: 8 days     Cristal Deer, MD [facility]  To reach me or the doctor on call, go to: [email or web address] Password [facility]  11/24/2020, 9:33 AM

## 2020-11-25 ENCOUNTER — Inpatient Hospital Stay: Payer: Self-pay

## 2020-11-25 LAB — CBC
HCT: 38.5 % — ABNORMAL LOW (ref 39.0–52.0)
Hemoglobin: 12.3 g/dL — ABNORMAL LOW (ref 13.0–17.0)
MCH: 26.1 pg (ref 26.0–34.0)
MCHC: 31.9 g/dL (ref 30.0–36.0)
MCV: 81.6 fL (ref 80.0–100.0)
Platelets: 239 10*3/uL (ref 150–400)
RBC: 4.72 MIL/uL (ref 4.22–5.81)
RDW: 17.6 % — ABNORMAL HIGH (ref 11.5–15.5)
WBC: 13.6 10*3/uL — ABNORMAL HIGH (ref 4.0–10.5)
nRBC: 0 % (ref 0.0–0.2)

## 2020-11-25 LAB — GLUCOSE, CAPILLARY
Glucose-Capillary: 121 mg/dL — ABNORMAL HIGH (ref 70–99)
Glucose-Capillary: 160 mg/dL — ABNORMAL HIGH (ref 70–99)
Glucose-Capillary: 140 mg/dL — ABNORMAL HIGH (ref 70–99)
Glucose-Capillary: 151 mg/dL — ABNORMAL HIGH (ref 70–99)

## 2020-11-25 LAB — BASIC METABOLIC PANEL
Anion gap: 10 (ref 5–15)
BUN: 38 mg/dL — ABNORMAL HIGH (ref 8–23)
CO2: 22 mmol/L (ref 22–32)
Calcium: 8.3 mg/dL — ABNORMAL LOW (ref 8.9–10.3)
Chloride: 95 mmol/L — ABNORMAL LOW (ref 98–111)
Creatinine, Ser: 1.42 mg/dL — ABNORMAL HIGH (ref 0.61–1.24)
GFR, Estimated: 50 mL/min — ABNORMAL LOW (ref >60)
Glucose, Bld: 142 mg/dL — ABNORMAL HIGH (ref 70–99)
Potassium: 4.8 mmol/L (ref 3.5–5.1)
Sodium: 127 mmol/L — ABNORMAL LOW (ref 135–145)

## 2020-11-25 MED ORDER — SULFAMETHOXAZOLE-TRIMETHOPRIM 800-160 MG PO TABS: 1.0000 | ORAL_TABLET | Freq: Two times a day (BID) | ORAL | 0 refills | Status: AC

## 2020-11-25 NOTE — TOC Transition Note (Signed)
Transition of Care [facility]) - CM/SW Discharge Note   Patient Details  Name: Brett Williamson MRN: [id number] Date of Birth: September 04, 1940  [facility] of [facility]) CM/SW Contact:  Natasha Bence, LCSW Phone Number: 11/25/2020, 11:48 AM   Clinical Narrative:    CSW notified of patient's readiness for discharge. CSW followed up with Pam with [facility]. Pam agreeable to provide services upon discharge. Corene Cornea with [facility] agreeable to begin [facility] services upon discharge. TOC signing off.    Final next level of care: [place] Barriers to Discharge: Barriers Resolved   Patient Goals and CMS Choice Patient states their goals for this hospitalization and ongoing recovery are:: [facility] with [facility] CMS Medicare.gov Compare Post Acute Care list provided to:: Patient Choice offered to / list presented to : Patient  Discharge Placement                    Patient and family notified of of transfer: 11/25/20  Discharge Plan and Services In-house Referral: Clinical Social Work              DME Arranged: N/A DME Agency: NA       HH Arranged: RN,PT,IV Antibiotics HH Agency: [place] ([facility] ([facility]) Date HH Agency Contacted: 11/25/20 Time [place]: 1148 Representative spoke with at [place]: Campbell Stall  Social Determinants of Health ([place]) Interventions     Readmission Risk Interventions Readmission Risk Prevention Plan 11/19/2020  Transportation Screening Complete  HRI or [place] Complete  Social Work Consult for [place] Planning/Counseling Complete  Medication Review Press photographer) Complete

## 2020-11-25 NOTE — TOC Transition Note (Incomplete Revision)
Transition of Care [facility]) - CM/SW Discharge Note   Patient Details  Name: Brett Williamson MRN: [id number] Date of Birth: 25-Sep-1940  Transition of Care [facility]) CM/SW Contact:  Natasha Bence, LCSW Phone Number: 11/25/2020, 11:48 AM   Clinical Narrative:    CSW notified of patient's readiness for discharge. CSW followed up with Pam with [facility]. Pam agreeable to provide services upon discharge. Corene Cornea with [facility] agreeable to begin [facility] services upon discharge. CSW complted med necessity and called EMS. TOC signing off.    Final next level of care: [place] Barriers to Discharge: Barriers Resolved   Patient Goals and CMS Choice Patient states their goals for this hospitalization and ongoing recovery are:: Return home with [facility] CMS Medicare.gov Compare Post Acute Care list provided to:: Patient Choice offered to / list presented to : Patient  Discharge Placement                    Patient and family notified of of transfer: 11/25/20  Discharge Plan and Services In-house Referral: Clinical Social Work              DME Arranged: N/A DME Agency: NA       HH Arranged: RN,PT,IV Antibiotics HH Agency: [place] ([facility] ([facility]) Date HH Agency Contacted: 11/25/20 Time [place]: 1148 Representative spoke with at [place]: Campbell Stall  Social Determinants of Health ([place]) Interventions     Readmission Risk Interventions Readmission Risk Prevention Plan 11/19/2020  Transportation Screening Complete  HRI or [place] Complete  Social Work Consult for [place] Planning/Counseling Complete  Medication Review Press photographer) Complete

## 2020-11-25 NOTE — Progress Notes (Signed)
Spoke with RN re PICC.  States has already notified [facility] for placement.

## 2020-11-25 NOTE — TOC Progression Note (Addendum)
Transition of Care [facility]) - Progression Note    Patient Details  Name: Rockland Kotarski MRN: [id number] Date of Birth: [birth year]  Transition of Care [facility]) CM/SW Contact  Natasha Bence, LCSW Phone Number: 11/25/2020, 1:21 PM  Clinical Narrative:    PICC line was not placed for patient prior to d/c. D/C delayed until 04/18. [facility] notified. TOC to follow.   Expected Discharge Plan: [place] Barriers to Discharge: Barriers Resolved  Expected Discharge Plan and Services Expected Discharge Plan: [place] In-house Referral: Clinical Social Work     Living arrangements for the past 2 months: Apartment Expected Discharge Date: 11/25/20               DME Arranged: N/A DME Agency: NA       HH Arranged: RN,PT,IV Antibiotics HH Agency: [place] [facility] ([facility]) Date HH Agency Contacted: 11/25/20 Time [place]: 1148 Representative spoke with at [place]: Campbell Stall   Social Determinants of Health ([place]) Interventions    Readmission Risk Interventions Readmission Risk Prevention Plan 11/19/2020  Transportation Screening Complete  HRI or [place] Complete  Social Work Consult for [place] Planning/Counseling Complete  Medication Review Press photographer) Complete

## 2020-11-25 NOTE — Discharge Summary (Addendum)
Discharge Summary  Brett Williamson ZSM:[id number] DOB: 07-04-1941  PCP: Neale Burly, MD  Admit date: 11/16/2020 Discharge date: 11/25/2020  Time spent:32   Recommendations for Outpatient Follow-up:  Appointment on 12/17/2020 with [name] at 2 pm  Discharge Diagnoses:  Active Hospital Problems   Diagnosis Date Noted  . Severe sepsis with acute organ dysfunction and Septic Shock  due to methicillin susceptible Staphylococcus aureus (MSSA) ([place]) 11/17/2020  . Left kidney mass 11/16/2020  . 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 Chronic combined systolic and diastolic CHF (congestive heart failure) ([place])   . Chronic kidney disease (CKD), stage III (moderate) (H[facility])   . HTN (hypertension)   . Controlled type 2 diabetes mellitus with hyperglycemia, without long-term current use of insulin ([place])   . Essential hypertension 06/19/2020  . Acute lower UTI     Resolved Hospital Problems  No resolved problems to display.    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 which was found incidentally during his last hospitalization approximately 6 weeks ago, history of severe aortic stenosis status post TAVR with history of recurrent UTIs admitted on 11/16/2020 with severe sepsis with acute metabolic encephalopathy and concerns for left renal cell carcinoma  Hospital Course:  Principal Problem:   Severe sepsis with acute organ dysfunction and Septic Shock  due to methicillin susceptible Staphylococcus aureus (MSSA) (HCC) Active Problems:   Essential hypertension   Acute lower UTI    Controlled type 2 diabetes mellitus with hyperglycemia, without long-term current use of insulin (HCC)   Chronic kidney disease (CKD), stage III (moderate) (HCC)   HTN (hypertension)   Chronic combined systolic and diastolic CHF (congestive heart failure) (HCC)   S/P TAVR (transcatheter aortic valve replacement)   Left kidney mass Brief Summary:- 80 y.o.malewith a history of stage III chronic kidney disease, diabetes currently diet-controlled, hypertension, obesity, 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 Tommy Medal (Infectious Disease) recommends IV Ancef for 6 weeks last dose 12/27/2020  A/p 1)Severe Staph Aureus Sepsis with Septic Shock--- POA-on admission patient had fevers, tachycardia, tachypnea, Leukocytosis, as well as elevated lactic acid and altered mentation and hypotension (hemodynamic instability) -Weaned off IV Levophed on 11/17/20  -Sepsis pathophysiology resolved (however leukocytosis persist) -Lactic acidosis resolved with IV fluids -blood cultures from 11/16/2020 with BCID 2/4 of anaerobic cx back GPC Staph Aureus MRSA Neg--- infected foot ulcers with cellulitis noted please see photos in epic -Repeat blood cultures from 11/17/2020 NGTD -switched from IV Rocephin to Ancef on 11/17/20 Discussed withDr. Tommy Medal (Infectious Disease) recommends IV Ancef for 6 weeks last dose 12/27/2020 -TTE demonstrates TAVR without evidence of vegetations or endocarditis TEE on 11/20/20--No evidence of endocarditis. Normal functioning prosthetic aortic valve -Antimicrobials this admission: Vancomycin4/8>>4/8 Cefepime 4/8>>4/8 Flagyl 4/8 >>4/8 Ceftriaxone 4/8>>4/9 Cefazolin  4/9>> -Appointment on 12/17/2020 with [name] at  2 pm Microbiology results: 4/8BCx: GPC in anaerobic bottles both sets.BCID +MSSA -Repeat blood cultures from 11/17/2020 NGTD 4/8UCx: GNR 4/8 MRSA PCR is negative  2)CKD stage -3A  -Creatinine is down to 1.44fom 1.58 --renally adjust medications, avoid nephrotoxic agents / dehydration / hypotension  3)H/o Rt sided subdural hematoma-in November 2021--- ICH was due to Trauma -status post decompression  4)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--patient will [facility]  5)S/p TAVR--- patient had aortic valve replacement on 09/25/2020- ---echo from 11/17/2020 shows functioning aortic valve without significant concerns  TEE as above #1 with normal functioning valve  6)Chronic diastolic dysfunction CHF--Echo from 11/17/2020 with EF of 60 to 65% and grade 1 diastolic CHF--- = Appears euvolemic, no evidence of CHF exacerbation at this time  7)DM2-A1c 6.9 reflecting excellent diabetic control PTA Use Novolog/Humalog Sliding scale insulin with Accu-Cheks/Fingersticks as ordered  8)Lower extremity pressure ulcers/PAD--- with superimposed cellulitis, antibiotics as above #1 -Wound care consult noted -Please see photos in epic  MRI of left heel without definite evidence of osteo- -ABIs from 11/18/20 were nondiagnostic,  -Lt Lower Extremities arterial Duplex  with possible hemodynamically significant Stenosis-- [place] resolution of bacteremia/sepsis  9)social/ethics--palliative care consult appreciated, patient is a DNR  10)LBP----Lumbar x-rays without fractures, 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 -Lumbar MRI without definite evidence of discitis -Physical therapy advised, methocarbamol and opiates as advised  11)Generalized Weakness and Deconditioning/low back  pain--physical therapist recommends SNF---patient declines to go to SNF he would prefer to go home with home health -  12)Citrobacter freundii UTI--- urine culture from 11/16/2020 noted, received cefepime on 11/16/2020, will complete treatment with Bactrim--watch renal function closely  13) hyponatremia/hypokalemia--avoid excessive free water, potassium has been replaced  14)antibiotic therapy----patient with MSSA bacteremia,no endocarditis,however patient has a prosthetic valve,there is a concern for possible osteomyelitis and possible discitis--- -Discussed withDr. VTommy Medal(Infectious Disease) recommends IV Ancef for 6 weeks last dose 12/27/2020  NB!! -Patient will need IV antibiotics at home through home health agency however he does not have for members at home who are able to help him with this at this time--- -[place] here until Sunday 11/25/20--- PT OT team is working on alternative plan to try to discharge patient home however without a family member willing or able to do IV therapy at home this will be difficult  Discharge Instructions:- 1)Possible  left renal cell carcinoma--patient will [facility]- -Please follow-up with Urologist Dr. MAlyson Inglesin about --- for recheck and follow-up evaluation  in his office----[facility] [place] [address] [place], [id number] Number----[phone]  2)Lt Lower Extremities with possible hemodynamically significant Stenosis-- you need to see Dr.Early vascular [place]  3)Iv Ancef 2 gm every 8 hours for bacteremia- Indication:  bacteremia First Dose: Yes Last Day of Therapy:  12/27/2020 Labs - Once weekly:  CBC/D and BMP, Labs - Every other week:  ESR and CRP  4)Wound Care- As noted above A)-Wound care to bilateral LEs at pretibial area:  Cleanse skin with  soap and water, rinse and pat dry.  Apply thin layer of Eucerin Cream to LEs, top with ABD pad to Pretibial area.  Secure with Kerlix wrap and paper tape.  B)Wound care to  bilateral heels:  Left Achilles region eschar and DTPI posterior heel and right heel healing DTPI:  Cleanse with soap and water, rinse and pat dry. Cover with xeroform gauze Kellie Simmering # 294), top with dry gauze and apply silicone heel foam dressing. Place feet into Prevalon pressure redistribution heel boots. Change twice daily  C)Wound care to ischemic areas on toes (right, digits 2-4):  Paint with betadine swabstick and allow to air dry.  No dressing.   Procedures:  TEE  Consultations:  ID  CARDIOLOGY  Discharge Exam: BP (!) 131/55 (BP Location: Left Arm)   Pulse (!) 53   Temp 97.9 F (36.6 C) (Oral)   Resp 20   Ht _0  (1.803 m)   Wt 112.3 kg   SpO2 97%   BMI 34.53 kg/m     General: 80 y.o. year-old male well developed well nourished in no acute distress.  Alert and oriented x3.  Obese  Cardiovascular: Regular rate and rhythm with no rubs or gallops.  No thyromegaly or JVD noted.    Respiratory: Clear to auscultation with no wheezes or rales. Good inspiratory effort.  Abdomen: Soft nontender nondistended with normal bowel sounds x4 quadrants.  Musculoskeletal: has lower extremity edema. .  Skin: has wound and  ulcerative lesions noted bilateral LE  Psychiatry: Mood is appropriate for condition and setting   Discharge Instructions You were cared for by a hospitalist during your hospital stay. If you have any questions about your discharge medications or the care you received while you were in the hospital after you are discharged, you can call the unit and asked to speak with the hospitalist on call if the hospitalist that took care of you is not available. Once you are discharged, your primary care physician will handle any further medical issues. Please note that NO REFILLS for any discharge  medications will be authorized once you are discharged, as it is imperative that you return to your primary care physician (or establish a relationship with a primary care physician if you do not have one) for your aftercare needs so that they can reassess your need for medications and monitor your lab values.  Discharge Instructions    [facility] pharmacist to adjust dose for Vancomycin, Aminoglycosides and other anti-infective therapies as requested by physician.   Complete by: As directed    [facility] to provide Cath Flo 32m   Complete by: As directed    Administer for PICC line occlusion and as ordered by physician for other access device issues.   Anaphylaxis Kit: Provided to treat any anaphylactic reaction to the medication being provided to the patient if First Dose or when requested by physician   Complete by: As directed    Epinephrine 174mml vial / amp: Administer 0.33m81m0.33ml59mubcutaneously once for moderate to severe anaphylaxis, nurse to call physician and pharmacy when reaction occurs and call 911 if needed for immediate care   Diphenhydramine 50mg68mIV vial: Administer 25-50mg 57mM PRN for first dose reaction, rash, itching, mild reaction, nurse to call physician and pharmacy when reaction occurs   Sodium Chloride 0.9% NS 500ml I30mdminister if needed for hypovolemic blood pressure drop or as ordered by physician after call to physician with anaphylactic reaction   Call MD for:  difficulty breathing, headache or visual disturbances   Complete by: As directed    Call MD for:  persistant dizziness or light-headedness   Complete by: As directed    Call MD for:  persistant nausea and vomiting   Complete by: As directed    Call MD for:  redness, tenderness, or signs of infection (pain, swelling, redness, odor or green/yellow discharge around incision site)   Complete by: As directed    Call MD for:  severe uncontrolled pain   Complete by: As directed     Call MD for:  temperature >100.4   Complete by: As directed    Call MD for:  temperature >100.4   Complete by: As directed    Change dressing on IV access line weekly and PRN   Complete by: As directed    Diet - low sodium heart healthy   Complete by: As directed    Discharge instructions   Complete by: As directed    1)Possible  left renal cell carcinoma--patient will [facility]- -Please follow-up with Urologist Dr. Alyson Ingles in about --- for recheck and follow-up evaluation  in his office----[facility] [place], [address], [place] [address], [place] [address] Phone Number----[phone]  2)Lt Lower Extremities with possible hemodynamically significant Stenosis-- you need to see Dr.Early vascular surgeon who comes to the [place] every Thursday  3)Iv Ancef 2 gm every 8 hours for bacteremia- Indication:  bacteremia First Dose: Yes Last Day of Therapy:  12/27/2020 Labs - Once weekly:  CBC/D and BMP, Labs - Every other week:  ESR and CRP  4)Wound Care- As noted above A)-Wound care to bilateral LEs at pretibial area:  Cleanse skin with soap and water, rinse and pat dry.  Apply thin layer of Eucerin Cream to LEs, top with ABD pad to Pretibial area.  Secure with Kerlix wrap and paper tape.  B)Wound care to bilateral heels:  Left Achilles region eschar and DTPI posterior heel and right heel healing DTPI:  Cleanse with soap and water, rinse and pat dry. Cover with xeroform gauze Kellie Simmering # 294), top with dry gauze and apply silicone heel foam dressing. Place feet into Prevalon pressure redistribution heel boots. Change twice daily  C)Wound care to ischemic areas on toes (right, digits 2-4):  Paint with betadine swabstick and allow to air dry.  No dressing.   Discharge wound care:   Complete by: As directed    As noted above A)-Wound care to bilateral LEs at pretibial area:  Cleanse skin with soap and water, rinse and pat dry.  Apply thin layer of  Eucerin Cream to LEs, top with ABD pad to Pretibial area.  Secure with Kerlix wrap and paper tape.  B)Wound care to bilateral heels:  Left Achilles region eschar and DTPI posterior heel and right heel healing DTPI:  Cleanse with soap and water, rinse and pat dry. Cover with xeroform gauze Kellie Simmering # 294), top with dry gauze and apply silicone heel foam dressing. Place feet into Prevalon pressure redistribution heel boots. Change twice daily  C)Wound care to ischemic areas on toes (right, digits 2-4):  Paint with betadine swabstick and allow to air dry.  No dressing.   Discharge wound care:   Complete by: As directed    See order   Flush IV access with Sodium Chloride 0.9% and Heparin 10 units/ml or 100 units/ml   Complete by: As directed    Home infusion instructions - [facility]   Complete by: As directed    Instructions: Flush IV access with Sodium Chloride 0.9% and Heparin 10units/ml or 100units/ml   Change dressing on IV access line: Weekly and PRN   Instructions Cath  Flo '2mg'$ : Administer for PICC Line occlusion and as ordered by physician for other access device   [facility] pharmacist to adjust dose for: Vancomycin, Aminoglycosides and other anti-infective therapies as requested by physician   Increase activity slowly   Complete by: As directed    Increase activity slowly   Complete by: As directed    Method of administration may be changed at the discretion of home infusion pharmacist based upon assessment of the patient and/or caregiver's ability to self-administer the medication ordered   Complete by: As directed      Allergies as of 11/25/2020   No Known Allergies     Medication List    STOP taking these medications   traMADol 50 MG tablet Commonly known as: ULTRAM     TAKE these medications   acetaminophen 325 MG tablet Commonly known as: TYLENOL Take 2 tablets (650 mg total) by mouth every 6 (six) hours as needed. What changed: reasons to take  this   amLODipine 2.5 MG tablet Commonly known as: NORVASC Take 1 tablet (2.5 mg total) by mouth daily.   aspirin EC 81 MG tablet Take 1 tablet (81 mg total) by mouth daily. Swallow whole.   carvedilol 3.125 MG tablet Commonly known as: COREG Take 1 tablet (3.125 mg total) by mouth 2 (two) times daily with a meal.   ceFAZolin  IVPB Commonly known as: ANCEF Inject 2 g into the vein every 8 (eight) hours. Indication:  bacteremia First Dose: Yes Last Day of Therapy:  12/27/2020 Labs - Once weekly:  CBC/D and BMP, Labs - Every other week:  ESR and CRP Method of administration: IV Push Method of administration may be changed at the discretion of home infusion pharmacist based upon assessment of the patient and/or caregiver's ability to self-administer the medication ordered.   methocarbamol 500 MG tablet Commonly known as: ROBAXIN Take 1 tablet (500 mg total) by mouth 3 (three) times daily.   oxyCODONE-acetaminophen 5-325 MG tablet Commonly known as: PERCOCET/ROXICET Take 1 tablet by mouth every 4 (four) hours as needed for moderate pain.   senna-docusate 8.6-50 MG tablet Commonly known as: Senokot-S Take 2 tablets by mouth at bedtime.   sulfamethoxazole-trimethoprim 800-160 MG tablet Commonly known as: BACTRIM DS Take 1 tablet by mouth 2 (two) times daily.   tamsulosin 0.4 MG Caps capsule Commonly known as: FLOMAX Take 1 capsule (0.4 mg total) by mouth daily after supper.            Discharge Care Instructions  (From admission, onward)         Start     Ordered   11/25/20 0000  Discharge wound care:       Comments: See order   11/25/20 1140   11/23/20 0000  Change dressing on IV access line weekly and PRN  (Home infusion instructions - [facility] )        11/23/20 1626   11/23/20 0000  Discharge wound care:       Comments: As noted above A)-Wound care to bilateral LEs at pretibial area:  Cleanse skin with soap and water, rinse and pat dry.  Apply  thin layer of Eucerin Cream to LEs, top with ABD pad to Pretibial area.  Secure with Kerlix wrap and paper tape.  B)Wound care to bilateral heels:  Left Achilles region eschar and DTPI posterior heel and right heel healing DTPI:  Cleanse with soap and water, rinse and pat dry. Cover with xeroform gauze Kellie Simmering #  [id number]), top with dry gauze and apply silicone heel foam dressing. Place feet into Prevalon pressure redistribution heel boots. Change twice daily  C)Wound care to ischemic areas on toes (right, digits 2-4):  Paint with betadine swabstick and allow to air dry.  No dressing.   11/23/20 1626         No Known Allergies  Follow-up Information    [facility] Follow up.   Why: PT/RN/OT Contact information: [address] [place] [phone]       Rosetta Posner, MD. Schedule an appointment as soon as possible for a visit in 1 week(s).   Specialties: Vascular Surgery, Cardiology Why: Lt Lower Extremities with possible hemodynamically significant Stenosis Contact information: [place] [address] [phone]        Cleon Gustin, MD. Schedule an appointment as soon as possible for a visit in 1 week(s).   Specialty: Urology Why: 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 Contact information: [address] [place] [address] [phone]                The results of significant diagnostics from this hospitalization (including imaging, microbiology, ancillary and laboratory) are listed below for reference.    Significant Diagnostic Studies: DG Lumbar Spine 2-3 Views  Result Date: 11/20/2020 CLINICAL DATA:  Low back pain.  No known injury. EXAM: LUMBAR SPINE - 2-3 VIEW COMPARISON:  None. FINDINGS: There are 5 non rib-bearing lumbar type vertebral bodies. Mild scoliotic curvature of the thoracolumbar spine with dominant caudal component convex the right measuring approximately 7 degrees (as measured from the  superior endplate of L1 to the inferior endplate of L4). There is straightening of the expected lumbar lordosis. No anterolisthesis or retrolisthesis. Lumbar vertebral body heights appear preserved. Moderate to severe multilevel lumbar spine DDD, worse at L2-L3, L3-L4 and L4-L5 with disc space height loss, endplate irregularity and small posteriorly directed disc osteophyte complexes at these locations. Limited visualization of the bilateral SI joints is normal. Large colonic stool burden without evidence of enteric obstruction. IMPRESSION: Moderate to severe multilevel lumbar spine DDD, worse at L2-L3, L3-L4 and L4-L5. Electronically Signed   By: Sandi Mariscal M.D.   On: 11/20/2020 15:39   MR LUMBAR SPINE WO CONTRAST  Result Date: 11/21/2020 CLINICAL DATA:  Low back pain.  Infection suspected. EXAM: MRI LUMBAR SPINE WITHOUT CONTRAST TECHNIQUE: Multiplanar, multisequence MR imaging of the lumbar spine was performed. No intravenous contrast was administered. COMPARISON:  None. FINDINGS: Segmentation: Standard segmentation is assumed. The inferior-most fully formed intervertebral disc is labeled L5-S1. Alignment: No substantial sagittal subluxation. Broad dextrocurvature. Vertebrae: There is mild edema involving the L1-L2, L2-L3, L3-L4, and L4-L5 intervertebral discs; however, this is favored degenerative in etiology given there is minimal bone marrow edema and surrounding endplate signal changes appear characteristic of degenerative/Modic change. Diffuse T1 hypointensity of the marrow. Conus medullaris and cauda equina: Conus extends to the level. Conus and cauda equina appear normal. Paraspinal and other soft tissues: Mild STIR hyperintense paraspinous edema in the upper lumbar/lower thoracic spine. Disc levels: T12-L1: Disc bulging and mild facet hypertrophy without significant canal or foraminal stenosis. L1-L2: Broad disc bulge with superimposed central inferiorly dissecting disc protrusion. Moderate bilateral  facet hypertrophy and ligamentum flavum thickening. Prominent dorsal epidural fat. Resulting mild to moderate canal stenosis. No significant foraminal stenosis. L2-L3: Disc height loss and desiccation. Broad disc bulge with moderate bilateral facet hypertrophy and ligamentum flavum thickening. Prominent dorsal epidural fat.  Resulting moderate canal stenosis and mild to moderate bilateral foraminal stenosis. L3-L4: Disc height loss and desiccation. Broad disc bulge with superimposed left subarticular disc protrusion. Resulting moderate to severe canal stenosis and severe left subarticular recess stenosis. Moderate left and mild-to-moderate right foraminal stenosis. L4-L5: Disc height loss and desiccation. Posterior disc bulge, severe bilateral facet hypertrophy. Resulting mild to moderate central canal stenosis with suspected bilateral subarticular recess narrowing. Evaluation is limited by motion at this level. Moderate to severe right greater than left foraminal stenosis. L5-S1: Broad disc bulge. Severe right and moderate left facet hypertrophy. Mild bilateral foraminal stenosis. No significant canal stenosis. IMPRESSION: 1. Mild edema involving the L1-L2, L2-L3, L3-L4, and L4-L5 intervertebral discs and mild upper lumbar/lower thoracic paraspinal soft tissue edema, which is favored degenerative in etiology given severe multilevel degenerative change and given endplate signal changes appear more characteristic of degenerative/Modic change. Early discitis is not completely excluded and a short interval follow-up MRI (with contrast if possible) could ensure stability if clinically indicated. 2. Moderate to severe canal and severe left subarticular recess stenosis at L3-L4. Moderate canal stenosis at L2-L3 and mild to moderate canal stenosis at L1-L2 and L4-L5. Suspected bilateral subarticular recess narrowing at L4-L5. 3. Moderate to severe right greater than left foraminal stenosis at L4-L5. Moderate left foraminal  stenosis at L3-L4. Mild to moderate foraminal stenosis on the right at L3-L4 and bilaterally at L2-L3. 4. Diffuse T1 hypointensity of the marrow, which is nonspecific but could relate to chronic anemia, chronic hypoxia (such as in smokers) or underlying lymphoproliferative disorder). Electronically Signed   By: Margaretha Sheffield MD   On: 11/21/2020 16:24   MR HEEL LEFT WO CONTRAST  Result Date: 11/21/2020 CLINICAL DATA:  Osteomyelitis of the foot EXAM: MR OF THE LEFT HEEL WITHOUT CONTRAST TECHNIQUE: Multiplanar, multisequence MR imaging of the ankle was performed. No intravenous contrast was administered. COMPARISON:  None. FINDINGS: TENDONS Peroneal: Longitudinal tearing of the peroneus longus tendon extending along a nearly 5 cm segment distal to the peroneal tubercle. Posteromedial: Small type 1 accessory navicular. Mild distal tibialis posterior tendinopathy, correlate clinically in assessing for tibialis posterior dysfunction. Mild flexor hallucis longus tenosynovitis just proximal to the knot of Henry. Anterior: Unremarkable Achilles: Mild distal Achilles tendinopathy with linear accentuated signal in the distal tendon. Plantar Fascia: Prominently thickened medial band of the plantar fascia likely with internal ossifications and with abnormal internal signal indicating plantar fasciitis. Mild thickening of the proximal lateral band of the plantar fascia. Plantar and Achilles calcaneal spurs noted. LIGAMENTS Lateral: Mildly thickened anterior inferior tibiofibular ligament without discontinuity. Edema along the anterior talofibular ligament but the ATFL appears intact. Medial: Unremarkable CARTILAGE Ankle Joint: 1.5 by 0.8 cm osteochondral lesion of the medial talar dome with mild cortical irregularity and underlying subcortical marrow edema, along with a focal chondral defect. Subtalar Joints/Sinus Tarsi: Spurring with mild associated subcortical marrow edema posteriorly along the posterior subtalar facet.  Edema in the sinus tarsi but without ligamentous discontinuity to suggest sinus tarsi syndrome. Bones: Subcortical marrow edema along portions of the midfoot and Lisfranc joint although these appear generally associated with underlying substantial spurring and potential erosive arthropathy versus subcortical cysts, and accordingly I favor these as being related to arthropathy rather than osteomyelitis. Posterolaterally along the heel on images 15-20 of series 4, there appears to be a cutaneous ulceration or soft tissue defect overlying the distal lateral Achilles tendon. Subtle endosteal an adjacent marrow edema in the proximal calcaneus is most likely reactive, less likely to  be due to early osteomyelitis. Other: Subcutaneous edema circumferentially around the ankle. IMPRESSION: 1. Cutaneous ulceration along the posterolateral ankle, with adjacent Achilles tendinopathy and some subtle marrow edema in the adjacent calcaneus which is more likely to be reactive and less likely to represent very early osteomyelitis. 2. Longitudinal tearing of the peroneus longus tendon. 3. Distal tibialis posterior tendinopathy, correlate clinically in assessing for tibialis posterior dysfunction. 4. Mild distal Achilles tendinopathy. 5. Proximal plantar fasciitis. 6. Edema along the anterior talofibular ligament but the ATFL does not appear torn. Mildly thickened anterior inferior tibiofibular ligament without discontinuity. 7. Osteochondral lesion of the medial talar dome with focal chondral thinning and slight cortical irregularity along with subcortical marrow edema. 8. Considerable spurring and degenerative arthropathy in the midfoot and Lisfranc joint. Electronically Signed   By: Van Clines M.D.   On: 11/21/2020 17:23   CT Abdomen Pelvis W Contrast  Result Date: 11/16/2020 CLINICAL DATA:  Fever and abdominal pain. History of renal cell carcinoma. EXAM: CT ABDOMEN AND PELVIS WITH CONTRAST TECHNIQUE: Multidetector CT  imaging of the abdomen and pelvis was performed using the standard protocol following bolus administration of intravenous contrast. CONTRAST:  151m OMNIPAQUE IOHEXOL 300 MG/ML  SOLN COMPARISON:  Abdominal MRI 09/22/2020.  Abdominal CT 09/20/2020 FINDINGS: Lower chest: Normal heart size post TAVR. Mild breathing motion artifact at the lung bases scattered atelectasis. No confluent consolidation or pleural effusion. Hepatobiliary: No focal liver abnormality is seen. Distended gallbladder with layering stones/sludge. No obvious pericholecystic fat stranding inflammation, there is motion artifact through this region. No biliary dilatation. Pancreas: Fatty atrophy. No ductal dilatation or inflammation. Cystic pancreatic tail lesion on MRI is not well appreciated by CT. Spleen: Enlarged spanning approximately 15 mm cranial caudal, no focal splenic abnormality. 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 Normal right adrenal gland. There is mild right hydroureteronephrosis, with the ureter dilated to the bladder insertion. No evidence of stone or obstructing lesion. There is minimal prominence of the left ureter and renal pelvis. There is right greater than left perinephric edema. Areas of left renal cortical scarring. Enhancing lesion arising from the lower left kidney measures 2 cm. Previous right renal cyst is grossly stable. Motion artifact obscures detailed assessment. Urinary bladder is physiologically distended, question of bladder wall thickening Stomach/Bowel: Stomach is within normal limits. Appendix is not confidently visualized, no evidence of appendicitis. No evidence of bowel wall thickening, distention, or inflammatory changes. Moderate colonic stool burden. Vascular/Lymphatic: Aortic atherosclerosis. No aortic aneurysm. Small retroperitoneal lymph nodes are not enlarged by size criteria. Reproductive: Prostatic calcifications. Other: No free air.  No  intra-abdominal abscess.  No ascites. Musculoskeletal: Diffuse degenerative disc disease and facet hypertrophy in the included spine. Moderate degenerative change of both hips. No acute osseous abnormality. IMPRESSION: 1. Mild right hydroureteronephrosis, with the ureter dilated to the bladder insertion. Mild prominence of the left ureter and renal pelvis. No evidence of stone or obstructing lesion. There is right greater than left perinephric edema. Findings are suspicious for urinary tract infection. 2. Enhancing 2 cm lesion arising from the lower left kidney, suspicious for renal cell carcinoma, unchanged in size from recent imaging. 3. Distended gallbladder with layering stones/sludge. No obvious pericholecystic fat stranding inflammation, there is motion artifact through this region. 4. Splenomegaly. 5. 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 6. Cystic pancreatic tail lesion on MRI is not well appreciated by CT. Aortic Atherosclerosis (ICD10-I70.0). Electronically Signed   By: MKeith RakeM.D.   On:  11/16/2020 16:47   US ARTERIAL ABI (SCREENING LOWER EXTREMITY)  Result Date: 11/18/2020 CLINICAL DATA:  80 year old male with bilateral lower extremity wounds. EXAM: NONINVASIVE PHYSIOLOGIC VASCULAR STUDY OF BILATERAL LOWER EXTREMITIES TECHNIQUE: Evaluation of both lower extremities were performed at rest, including calculation of ankle-brachial indices with single level Doppler, pressure and pulse volume recording. COMPARISON:  None. FINDINGS: Right ABI:  1.76 Left ABI:  1.67 Right Lower Extremity: Monophasic, dampened waveforms at the level of the ankle. Left Lower Extremity: Monophasic, dampened waveforms at the level of the ankle. IMPRESSION: Nondiagnostic ankle-brachial indices secondary to noncompressible vessels. Consider lower extremity arterial duplex for further characterization and evaluation for flow-limiting stenoses. Ruthann Cancer, MD [facility]  Special[facility] Radiology Electronically Signed   By: Ruthann Cancer MD   On: 11/18/2020 09:41   US ARTERIAL LOWER EXTREMITY DUPLEX BILATERAL  Result Date: 11/23/2020 CLINICAL DATA:  80 year old male with concern for peripheral artery disease. EXAM: BILATERAL LOWER EXTREMITY ARTERIAL DUPLEX SCAN TECHNIQUE: Gray-scale sonography as well as color Doppler and duplex ultrasound was performed to evaluate the arteries of both lower extremities including the common, superficial and profunda femoral arteries, popliteal artery and calf arteries. COMPARISON:  ABI from 11/18/2020 FINDINGS: Right Lower Extremity ABI: Not obtained. Inflow: Normal common femoral arterial waveforms and velocities. No evidence of inflow (aortoiliac) disease. Outflow: Normal profunda femoral, superficial femoral and popliteal arterial waveforms and velocities. No focal elevation of the PSV to suggest stenosis. Runoff: Normal posterior and anterior tibial arterial waveforms and velocities proximally. Obscuration of the distal posterior tibial and anterior tibial arteries due to overlying bandaging. Left Lower Extremity ABI: Not obtained. Inflow: Normal common femoral arterial waveforms and velocities. No evidence of inflow (aortoiliac) disease. Outflow: Normal profunda femoral, superficial femoral and popliteal arterial waveforms and velocities. No focal elevation of the PSV to suggest stenosis. Runoff: Normal posterior and anterior tibial arterial waveforms and velocities proximally. Focal elevation of the mid anterior tibial artery. Obscuration of the distal posterior tibial and anterior tibial arteries due to overlying bandaging. IMPRESSION: 1. Focal elevation in the mid left anterior tibial artery which could indicate hemodynamically significant stenosis. The distal bilateral tibial arteries are obscured due to overlying bandaging. 2. Widely patent bilateral inflow and outflow vessels. Ruthann Cancer, MD Vascular and Interventional  Radiology Specialists [facility] Radiology Electronically Signed   By: Ruthann Cancer MD   On: 11/23/2020 11:55   DG CHEST PORT 1 VIEW  Result Date: 11/20/2020 CLINICAL DATA:  Shortness of breath EXAM: PORTABLE CHEST 1 VIEW COMPARISON:  11/16/2020 FINDINGS: Cardiac shadow is within normal limits. Status post TAVR. Lungs are well aerated bilaterally. Mild increased central vascular congestion is noted without significant parenchymal edema. No focal infiltrate is seen. No acute bony abnormality is noted. IMPRESSION: Mild central vascular congestion is noted. Electronically Signed   By: Inez Catalina M.D.   On: 11/20/2020 08:10   DG Chest Port 1 View  Result Date: 11/16/2020 CLINICAL DATA:  Fever with altered mental status EXAM: PORTABLE CHEST 1 VIEW COMPARISON:  September 24, 2020 FINDINGS: Lungs are clear. Heart size and pulmonary vascularity are normal. No adenopathy. Patient is status post aortic valve replacement. There is degenerative change in each shoulder. IMPRESSION: No edema or airspace opacity. Heart size within normal limits. Status post aortic valve replacement. Electronically Signed   By: Lowella Grip III M.D.   On: 11/16/2020 11:46   ECHOCARDIOGRAM COMPLETE  Result Date: 11/17/2020    ECHOCARDIOGRAM REPORT   Patient Name:   Brett  Williamson Date of Exam: 11/17/2020 Medical Rec #:  [id number]    Height:       71.0 in Accession #:    [id number]   Weight:       240.0 lb Date of Birth:  12-20-1940     BSA:          2.278 m Patient Age:    18 years     BP:           128/51 mmHg Patient Gender: M            HR:           64 bpm. Exam Location:  [facility] Procedure: 2D Echo, Cardiac Doppler and Color Doppler Indications:    Fever  History:        Patient has prior history of Echocardiogram examinations, most                 recent 11/14/2020. CHF, Aortic Valve Disease, Arrythmias:RBBB;                 Risk Factors:Hypertension, Diabetes and morbid obesity. Renal                 cell carcinoma, CKD.   Sonographer:    Dustin Flock RDCS Referring Phys: [place]  1. Left ventricular ejection fraction, by estimation, is 60 to 65%. The left ventricle has normal function. The left ventricle has no regional wall motion abnormalities. There is mild concentric left ventricular hypertrophy. Left ventricular diastolic parameters are consistent with Grade I diastolic dysfunction (impaired relaxation). Elevated left atrial pressure.  2. Right ventricular systolic function is normal. The right ventricular size is normal. Tricuspid regurgitation signal is inadequate for assessing PA pressure.  3. Left atrial size was moderately dilated.  4. The mitral valve is degenerative. Mild mitral valve regurgitation. Moderate mitral annular calcification.  5. The aortic valve has been repaired/replaced. Procedure Date: 09/25/2020. A 75m Edwards Sapien TAVR valve is present and appears well seated with normal function by Doppler interrogation. Mean gradient 197mg, DOI 0.44. Trivial central AI without evidence of paravalvular leak. Comparison(s): Compared to prior TTE on 11/14/20, there is no significant change. AoV mean gradient remains at 13.3. Conclusion(s)/Recommendation(s): No evidence of valvular vegetations on this transthoracic echocardiogram. Would recommend a transesophageal echocardiogram to exclude infective endocarditis if clinically indicated. FINDINGS  Left Ventricle: Left ventricular ejection fraction, by estimation, is 60 to 65%. The left ventricle has normal function. The left ventricle has no regional wall motion abnormalities. The left ventricular internal cavity size was normal in size. There is  mild concentric left ventricular hypertrophy. Left ventricular diastolic parameters are consistent with Grade I diastolic dysfunction (impaired relaxation). Elevated left atrial pressure. Right Ventricle: The right ventricular size is normal. No increase in right ventricular wall thickness. Right  ventricular systolic function is normal. Tricuspid regurgitation signal is inadequate for assessing PA pressure. Left Atrium: Left atrial size was moderately dilated. Right Atrium: Right atrial size was normal in size. Pericardium: There is no evidence of pericardial effusion. Mitral Valve: The mitral valve is degenerative in appearance. There is moderate thickening of the mitral valve leaflet(s). There is moderate calcification of the mitral valve leaflet(s). Moderate mitral annular calcification. Mild mitral valve regurgitation. Tricuspid Valve: The tricuspid valve is normal in structure. Tricuspid valve regurgitation is trivial. Aortic Valve: A 2933mdwards Sapien TAVR valve is present and appears well seated with normal function by Doppler interrogation. Mean gradient 18m45m DOI 0.44.  Trivial central AI without evidence of paravalvular leak. The aortic valve has been repaired/replaced. Aortic valve regurgitation is trivial. Aortic valve mean gradient measures 13.0 mmHg. Aortic valve peak gradient measures 25.2 mmHg. Aortic valve area, by VTI measures 2.17 cm. There is a 29 mm Sapien prosthetic, stented (TAVR) valve present in the aortic position. Pulmonic Valve: The pulmonic valve was normal in structure. Pulmonic valve regurgitation is not visualized. Aorta: The aortic root and ascending aorta are structurally normal, with no evidence of dilitation. IAS/Shunts: No atrial level shunt detected by color flow Doppler.  LEFT VENTRICLE PLAX 2D LVIDd:         4.78 cm  Diastology LVIDs:         2.90 cm  LV e' medial:    5.00 cm/s LV PW:         1.35 cm  LV E/e' medial:  22.2 LV IVS:        1.39 cm  LV e' lateral:   7.40 cm/s LVOT diam:     2.50 cm  LV E/e' lateral: 15.0 LV SV:         110 LV SV Index:   48 LVOT Area:     4.91 cm  RIGHT VENTRICLE RV Basal diam:  4.74 cm RV S prime:     10.60 cm/s TAPSE (M-mode): 2.4 cm LEFT ATRIUM              Index       RIGHT ATRIUM           Index LA diam:        4.30 cm  1.89  cm/m  RA Area:     17.30 cm LA Vol (A2C):   110.0 ml 48.28 ml/m RA Volume:   51.30 ml  22.52 ml/m LA Vol (A4C):   93.9 ml  41.21 ml/m LA Biplane Vol: 103.0 ml 45.21 ml/m  AORTIC VALVE AV Area (Vmax):    2.17 cm AV Area (Vmean):   2.24 cm AV Area (VTI):     2.17 cm AV Vmax:           251.00 cm/s AV Vmean:          164.000 cm/s AV VTI:            0.507 m AV Peak Grad:      25.2 mmHg AV Mean Grad:      13.0 mmHg LVOT Vmax:         111.00 cm/s LVOT Vmean:        75.000 cm/s LVOT VTI:          0.224 m LVOT/AV VTI ratio: 0.44  AORTA Ao Root diam: 2.90 cm MITRAL VALVE MV Area (PHT): 2.03 cm     SHUNTS MV Decel Time: 373 msec     Systemic VTI:  0.22 m MV E velocity: 111.00 cm/s  Systemic Diam: 2.50 cm MV A velocity: 115.00 cm/s MV E/A ratio:  0.97 Gwyndolyn Kaufman MD Electronically signed by Gwyndolyn Kaufman MD Signature Date/Time: 11/17/2020/12:03:55 PM    Final    ECHOCARDIOGRAM COMPLETE  Result Date: 11/14/2020    ECHOCARDIOGRAM REPORT   Patient Name:   Brett Williamson Date of Exam: 11/14/2020 Medical Rec #:  [id number]    Height:       71.0 in Accession #:    [id number]   Weight:       240.5 lb Date of Birth:  [birth year]     BSA:  2.281 m Patient Age:    66 years     BP:           150/69 mmHg Patient Gender: M            HR:           71 bpm. 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 CHF, Arrythmias:RBBB; Risk                 Factors:Hypertension and Diabetes. Sleep Apnea. CKD. Aortic                 Valve: 29 mm Edwards Sapien prosthetic, stented (TAVR) valve is                 present in the aortic position. Procedure Date:09/25/2020.                 Morbid obesity.  Sonographer:    Alvino Chapel RCS Referring Phys: [id number] [place]   1. Left ventricular ejection fraction, by estimation, is 65 to 70%. The left ventricle has normal function. The left ventricle has no regional wall motion abnormalities. There is moderate left ventricular hypertrophy. Left ventricular diastolic parameters are consistent with Grade I diastolic dysfunction (impaired relaxation). Elevated left atrial pressure.  2. Right ventricular systolic function is normal. The right ventricular size is normal.  3. Left atrial size was mildly dilated.  4. The mitral valve is normal in structure. Trivial mitral valve regurgitation. No evidence of mitral stenosis. Moderate mitral annular calcification.  5. Edwards Sapien 3 THV size 29 mm valve is in the AV position. . The aortic valve has been repaired/replaced. Aortic valve regurgitation is not visualized. No aortic stenosis is present. FINDINGS  Left Ventricle: Left ventricular ejection fraction, by estimation, is 65 to 70%. The left ventricle has normal function. The left ventricle has no regional wall motion abnormalities. The left ventricular internal cavity size was normal in size. There is  moderate left ventricular hypertrophy. Left ventricular diastolic parameters are consistent with Grade I diastolic dysfunction (impaired relaxation). Elevated left atrial pressure. Right Ventricle: The right ventricular size is normal. No increase in right ventricular wall thickness. Right ventricular systolic function is normal. Left Atrium: Left atrial size was mildly dilated. Right Atrium: Right atrial size was normal in size. Pericardium: There is no evidence of pericardial effusion. Mitral Valve: The mitral valve is normal in structure. Moderate mitral annular calcification. Trivial mitral valve regurgitation. No evidence of mitral valve stenosis. MV peak gradient, 7.4 mmHg. The mean mitral valve gradient is 3.0 mmHg. Tricuspid Valve: The tricuspid valve is normal in structure. Tricuspid valve regurgitation is not demonstrated. No  evidence of tricuspid stenosis. Aortic Valve: Edwards Sapien 3 THV size 29 mm valve is in the AV position. The aortic valve has been repaired/replaced. Aortic valve regurgitation is not visualized. No aortic stenosis is present. Aortic valve mean gradient measures 13.3 mmHg. Aortic valve peak gradient measures 26.2 mmHg. Aortic valve area, by VTI measures 1.40 cm. Pulmonic Valve: The pulmonic valve was not well visualized. Pulmonic valve regurgitation is mild.  No evidence of pulmonic stenosis. Aorta: The aortic root is normal in size and structure. Pulmonary Artery: Indeterminant PASP, inadequate TR jet. Venous: The inferior vena cava was not well visualized. IAS/Shunts: No atrial level shunt detected by color flow Doppler.  LEFT VENTRICLE PLAX 2D LVIDd:         4.80 cm  Diastology LVIDs:         3.10 cm  LV e' medial:    5.11 cm/s LV PW:         1.40 cm  LV E/e' medial:  19.2 LV IVS:        1.40 cm  LV e' lateral:   6.31 cm/s LVOT diam:     1.90 cm  LV E/e' lateral: 15.5 LV SV:         73 LV SV Index:   32 LVOT Area:     2.84 cm  RIGHT VENTRICLE RV S prime:     12.30 cm/s TAPSE (M-mode): 1.9 cm LEFT ATRIUM             Index       RIGHT ATRIUM           Index LA diam:        4.80 cm 2.10 cm/m  RA Area:     17.30 cm LA Vol (A2C):   97.2 ml 42.62 ml/m RA Volume:   47.90 ml  21.00 ml/m LA Vol (A4C):   62.0 ml 27.19 ml/m LA Biplane Vol: 80.2 ml 35.17 ml/m  AORTIC VALVE AV Area (Vmax):    1.27 cm AV Area (Vmean):   1.32 cm AV Area (VTI):     1.40 cm AV Vmax:           256.00 cm/s AV Vmean:          162.333 cm/s AV VTI:            0.524 m AV Peak Grad:      26.2 mmHg AV Mean Grad:      13.3 mmHg LVOT Vmax:         115.00 cm/s LVOT Vmean:        75.300 cm/s LVOT VTI:          0.259 m LVOT/AV VTI ratio: 0.49  AORTA Ao Root diam: 3.30 cm MITRAL VALVE MV Area (PHT): 1.78 cm     SHUNTS MV Area VTI:   1.26 cm     Systemic VTI:  0.26 m MV Peak grad:  7.4 mmHg     Systemic Diam: 1.90 cm MV Mean grad:  3.0 mmHg MV  Vmax:       1.36 m/s MV Vmean:      71.8 cm/s MV Decel Time: 427 msec MV E velocity: 98.00 cm/s MV A velocity: 129.00 cm/s MV E/A ratio:  0.76 Carlyle Dolly MD Electronically signed by Carlyle Dolly MD Signature Date/Time: 11/14/2020/4:43:28 PM    Final    ECHO TEE  Result Date: 11/20/2020    TRANSESOPHOGEAL ECHO REPORT   Patient Name:   Brett Williamson Date of Exam: 11/20/2020 Medical Rec #:  [id number]    Height:       71.0 in Accession #:    [id number]   Weight:       251.8 lb Date of Birth:  Jun 11, 1941     BSA:          2.325 m Patient Age:    10 years     BP:  141/60 mmHg Patient Gender: M            HR:           53 bpm. Exam Location:  [facility] Procedure: Transesophageal Echo Indications:    Bacteremia 790.7 / R78.81  History:        Patient has prior history of Echocardiogram examinations, most                 recent 11/17/2020. Risk Factors:Non-Smoker, Diabetes and                 Hypertension. RBBB, TAVR wtih a 29 mm Edwards S/P TAVR                 (transcatheter aortic valve replacement.  Sonographer:    [name] RDCS (AE) Referring Phys: [place]: The transesophogeal probe was passed without difficulty through the esophogus of the patient. Sedation performed by performing physician. The patient developed no complications during the procedure. IMPRESSIONS  1. Left ventricular ejection fraction, by estimation, is 60 to 65%. The left ventricle has normal function.  2. Right ventricular systolic function is normal. The right ventricular size is normal.  3. Left atrial size was mildly dilated. No left atrial/left atrial appendage thrombus was detected. The LAA emptying velocity was 50 cm/s.  4. Right atrial size was mildly dilated.  5. The mitral valve is normal in structure. Trivial mitral valve regurgitation. No evidence of mitral stenosis.  6. A 50m Edwards Sapien TAVR valve is in the AV position with normal function. . The aortic valve has been  repaired/replaced. Aortic valve regurgitation is not visualized. No aortic stenosis is present. Conclusion(s)/Recommendation(s): No evidence of vegetation/infective endocarditis on this transesophageal echocardiogram. FINDINGS  Left Ventricle: Left ventricular ejection fraction, by estimation, is 60 to 65%. The left ventricle has normal function. The left ventricular internal cavity size was normal in size. Right Ventricle: The right ventricular size is normal. No increase in right ventricular wall thickness. Right ventricular systolic function is normal. Left Atrium: Left atrial size was mildly dilated. No left atrial/left atrial appendage thrombus was detected. The LAA emptying velocity was 50 cm/s. Right Atrium: Right atrial size was mildly dilated. Pericardium: There is no evidence of pericardial effusion. Mitral Valve: The mitral valve is normal in structure. Trivial mitral valve regurgitation. No evidence of mitral valve stenosis. Tricuspid Valve: The tricuspid valve is normal in structure. Tricuspid valve regurgitation is not demonstrated. No evidence of tricuspid stenosis. Aortic Valve: A 291mEdwards Sapien TAVR valve is in the AV position with normal function. The aortic valve has been repaired/replaced. Aortic valve regurgitation is not visualized. No aortic stenosis is present. Pulmonic Valve: The pulmonic valve was not well visualized. Pulmonic valve regurgitation is not visualized. No evidence of pulmonic stenosis. Aorta: The aortic root is normal in size and structure. IAS/Shunts: No atrial level shunt detected by color flow Doppler. JoCarlyle DollyD Electronically signed by JoCarlyle DollyD Signature Date/Time: 11/20/2020/11:36:39 AM    Final     Microbiology: Recent Results (from the past 240 hour(s))  Blood Culture (routine x 2)     Status: Abnormal   Collection Time: 11/16/20 11:34 AM   Specimen: BLOOD  Result Value Ref Range Status   Specimen Description   Final    BLOOD LEFT  ANTECUBITAL [facility] [place] [id number]  Special Requests   Final    Blood Culture adequate volume BOTTLES  DRAWN AEROBIC AND ANAEROBIC Performed at [facility], [address] [place], [place] [address]    Culture  Setup Time   Final    GRAM POSITIVE COCCI ANAEROBIC BOTTLE ONLY Gram Stain Report Called to,Read Back By and Verified With: ELLER @ [id number] ON [id number] BY HENDERSON L. CRITICAL RESULT CALLED TO, READ BACK BY AND VERIFIED WITH: J,MENDENHALL PHARMD _0  11/17/20 EB Performed at [facility], [place] [address], [place], [place] [address]    Culture STAPHYLOCOCCUS AUREUS (A)  Final   Report Status 11/19/2020 FINAL  Final   Organism ID, Bacteria STAPHYLOCOCCUS AUREUS  Final      Susceptibility   Staphylococcus aureus - MIC*    CIPROFLOXACIN >=8 RESISTANT Resistant     ERYTHROMYCIN <=0.25 SENSITIVE Sensitive     GENTAMICIN <=0.5 SENSITIVE Sensitive     OXACILLIN 0.5 SENSITIVE Sensitive     TETRACYCLINE <=1 SENSITIVE Sensitive     VANCOMYCIN <=0.5 SENSITIVE Sensitive     TRIMETH/SULFA <=10 SENSITIVE Sensitive     CLINDAMYCIN <=0.25 SENSITIVE Sensitive     RIFAMPIN <=0.5 SENSITIVE Sensitive     Inducible Clindamycin NEGATIVE Sensitive     * STAPHYLOCOCCUS AUREUS  Blood Culture ID Panel (Reflexed)     Status: Abnormal   Collection Time: 11/16/20 11:34 AM  Result Value Ref Range Status   Enterococcus faecalis NOT DETECTED NOT DETECTED Final   Enterococcus Faecium NOT DETECTED NOT DETECTED Final   Listeria monocytogenes NOT DETECTED NOT DETECTED Final   Staphylococcus species DETECTED (A) NOT DETECTED Final    Comment: CRITICAL RESULT CALLED TO, READ BACK BY AND VERIFIED WITH: J,MENDENHALL PHARMD _1  11/17/20 EB    Staphylococcus aureus (BCID) DETECTED (A) NOT DETECTED Final    Comment: CRITICAL RESULT CALLED TO, READ BACK BY AND VERIFIED WITH: J,MENDENHALL PHARMD _2  11/17/20 EB    Staphylococcus epidermidis NOT DETECTED NOT  DETECTED Final   Staphylococcus lugdunensis NOT DETECTED NOT DETECTED Final   Streptococcus species NOT DETECTED NOT DETECTED Final   Streptococcus agalactiae NOT DETECTED NOT DETECTED Final   Streptococcus pneumoniae NOT DETECTED NOT DETECTED Final   Streptococcus pyogenes NOT DETECTED NOT DETECTED Final   A.calcoaceticus-baumannii NOT DETECTED NOT DETECTED Final   Bacteroides fragilis NOT DETECTED NOT DETECTED Final   Enterobacterales NOT DETECTED NOT DETECTED Final   Enterobacter cloacae complex NOT DETECTED NOT DETECTED Final   Escherichia coli NOT DETECTED NOT DETECTED Final   Klebsiella aerogenes NOT DETECTED NOT DETECTED Final   Klebsiella oxytoca NOT DETECTED NOT DETECTED Final   Klebsiella pneumoniae NOT DETECTED NOT DETECTED Final   Proteus species NOT DETECTED NOT DETECTED Final   Salmonella species NOT DETECTED NOT DETECTED Final   Serratia marcescens NOT DETECTED NOT DETECTED Final   Haemophilus influenzae NOT DETECTED NOT DETECTED Final   Neisseria meningitidis NOT DETECTED NOT DETECTED Final   Pseudomonas aeruginosa NOT DETECTED NOT DETECTED Final   Stenotrophomonas maltophilia NOT DETECTED NOT DETECTED Final   Candida albicans NOT DETECTED NOT DETECTED Final   Candida auris NOT DETECTED NOT DETECTED Final   Candida glabrata NOT DETECTED NOT DETECTED Final   Candida krusei NOT DETECTED NOT DETECTED Final   Candida parapsilosis NOT DETECTED NOT DETECTED Final   Candida tropicalis NOT DETECTED NOT DETECTED Final   Cryptococcus neoformans/gattii NOT DETECTED NOT DETECTED Final   Meth resistant mecA/C and MREJ NOT DETECTED NOT DETECTED Final    Comment: Performed at [facility], [address] [place], [place] [address]  Blood Culture (routine  x 2)     Status: Abnormal   Collection Time: 11/16/20 11:41 AM   Specimen: BLOOD  Result Value Ref Range Status   Specimen Description   Final    BLOOD BLOOD RIGHT ARM Performed at [facility], [address]  [place], [place] [address]    Special Requests   Final    Blood Culture results may not be optimal due to an excessive volume of blood received in culture bottles BOTTLES DRAWN AEROBIC AND ANAEROBIC Performed at [facility], [address] [place], [place] [address]    Culture  Setup Time   Final    GRAM POSITIVE COCCI ANAEROBIC BOTTLE ONLY Gram Stain Report Called to,Read Back By and Verified With: ELLER @ [id number] ON [id number] BY HENDERSON L. aerobic bottle  GRAM POSITIVE COCCI Performed at [facility], [address], [place], [place] [address]    Culture (A)  Final    STAPHYLOCOCCUS AUREUS SUSCEPTIBILITIES PERFORMED ON PREVIOUS CULTURE WITHIN THE LAST 5 DAYS. Performed at [facility], [place] [address], [place], [place] [address]    Report Status 11/19/2020 FINAL  Final  Resp Panel by RT-PCR (Flu A&B, Covid) Nasopharyngeal Swab     Status: None   Collection Time: 11/16/20 11:49 AM   Specimen: Nasopharyngeal Swab; Nasopharyngeal(NP) swabs in vial transport medium  Result Value Ref Range Status   SARS Coronavirus 2 by RT PCR NEGATIVE NEGATIVE Final    Comment: (NOTE) SARS-CoV-2 target nucleic acids are NOT DETECTED.  The SARS-CoV-2 RNA is generally detectable in upper respiratory specimens during the acute phase of infection. The lowest concentration of SARS-CoV-2 viral copies this assay can detect is 138 copies/mL. A negative result does not preclude SARS-Cov-2 infection and should not be used as the sole basis for treatment or other patient management decisions. A negative result may occur with  improper specimen collection/handling, submission of specimen other than nasopharyngeal swab, presence of viral mutation(s) within the areas targeted by this assay, and inadequate number of viral copies(<138 copies/mL). A negative result must be combined with clinical observations, patient history, and epidemiological information. The expected result is Negative.  Fact Sheet for Patients:   EntrepreneurPulse.com.au  Fact Sheet for Healthcare Providers:  IncredibleEmployment.be  This test is no t yet approved or cleared by the [place] FDA and  has been authorized for detection and/or diagnosis of SARS-CoV-2 by FDA under an Emergency Use Authorization (EUA). This EUA will remain  in effect (meaning this test can be used) for the duration of the COVID-19 declaration under Section 564(b)(1) of the Act, 21 U.S.C.section 360bbb-3(b)(1), unless the authorization is terminated  or revoked sooner.       Influenza A by PCR NEGATIVE NEGATIVE Final   Influenza B by PCR NEGATIVE NEGATIVE Final    Comment: (NOTE) The Xpert Xpress SARS-CoV-2/FLU/RSV plus assay is intended as an aid in the diagnosis of influenza from Nasopharyngeal swab specimens and should not be used as a sole basis for treatment. Nasal washings and aspirates are unacceptable for Xpert Xpress SARS-CoV-2/FLU/RSV testing.  Fact Sheet for Patients: EntrepreneurPulse.com.au  Fact Sheet for Healthcare Providers: IncredibleEmployment.be  This test is not yet approved or cleared by the [place] FDA and has been authorized for detection and/or diagnosis of SARS-CoV-2 by FDA under an Emergency Use Authorization (EUA). This EUA will remain in effect (meaning this test can be used) for the duration of the COVID-19 declaration under Section 564(b)(1) of the Act, 21 U.S.C. section 360bbb-3(b)(1), unless the authorization  is terminated or revoked.  Performed at [facility], [address] [place], [place] [address]   Urine culture     Status: Abnormal   Collection Time: 11/16/20 12:36 PM   Specimen: Urine, Catheterized  Result Value Ref Range Status   Specimen Description   Final    URINE, CATHETERIZED Performed at [facility], [address] [place], [place] [address]    Special Requests   Final    NONE Performed at [facility], [address], [place], [place] [address]    Culture >=100,000 COLONIES/mL CITROBACTER FREUNDII (A)  Final   Report Status 11/19/2020 FINAL  Final   Organism ID, Bacteria CITROBACTER FREUNDII (A)  Final      Susceptibility   Citrobacter freundii - MIC*    CEFAZOLIN >=64 RESISTANT Resistant     CEFEPIME 0.25 SENSITIVE Sensitive     CEFTRIAXONE >=64 RESISTANT Resistant     CIPROFLOXACIN <=0.25 SENSITIVE Sensitive     GENTAMICIN <=1 SENSITIVE Sensitive     IMIPENEM <=0.25 SENSITIVE Sensitive     NITROFURANTOIN <=16 SENSITIVE Sensitive     TRIMETH/SULFA <=20 SENSITIVE Sensitive     PIP/TAZO >=128 RESISTANT Resistant     * >=100,000 COLONIES/mL CITROBACTER FREUNDII  MRSA PCR Screening     Status: None   Collection Time: 11/17/20  3:57 AM   Specimen: Nasal Mucosa; Nasopharyngeal  Result Value Ref Range Status   MRSA by PCR NEGATIVE NEGATIVE Final    Comment:        The GeneXpert MRSA Assay (FDA approved for NASAL specimens only), is one component of a comprehensive MRSA colonization surveillance program. It is not intended to diagnose MRSA infection nor to guide or monitor treatment for MRSA infections. Performed at [facility], [address], [place], [place] [address]   Culture, blood (Routine X 2) w Reflex to ID Panel     Status: None   Collection Time: 11/17/20  7:37 PM   Specimen: Left Antecubital; Blood  Result Value Ref Range Status   Specimen Description LEFT ANTECUBITAL  Final   Special Requests   Final    Blood Culture adequate volume BOTTLES DRAWN AEROBIC AND ANAEROBIC   Culture   Final    NO GROWTH 5 DAYS Performed at [facility], [address], [place], [place] [address]    Report Status 11/22/2020 FINAL  Final  Culture, blood (Routine X 2) w Reflex to ID Panel     Status: None   Collection Time: 11/17/20  7:42 PM   Specimen: BLOOD RIGHT HAND  Result Value Ref Range Status   Specimen Description BLOOD RIGHT HAND  Final   Special Requests   Final     Blood Culture results may not be optimal due to an inadequate volume of blood received in culture bottles BOTTLES DRAWN AEROBIC AND ANAEROBIC   Culture   Final    NO GROWTH 5 DAYS Performed at [facility], [address], [place], [place] [address]    Report Status 11/22/2020 FINAL  Final     Labs: Basic Metabolic Panel: Recent Labs  Lab 11/20/20 0406 11/22/20 0400 11/23/20 0422  NA 131* 130* 130*  K 3.5 3.4* 4.3  CL 98 95* 97*  CO2 _0 GLUCOSE 131* 113* 142*  BUN 29* 29* 28*  CREATININE 1.18 1.17 0.93  CALCIUM 8.0* 8.2* 8.3*  PHOS 2.5  --   --    Liver Function Tests: Recent Labs  Lab 11/20/20 0406  ALBUMIN  2.5*   No results for input(s): LIPASE, AMYLASE in the last 168 hours. No results for input(s): AMMONIA in the last 168 hours. CBC: Recent Labs  Lab 11/20/20 0406 11/22/20 0400 11/23/20 0422  WBC 12.0* 14.4* 15.9*  HGB 11.5* 12.4* 12.6*  HCT 36.0* 39.5 39.0  MCV 81.8 82.1 81.1  PLT 112* 161 228   Cardiac Enzymes: No results for input(s): CKTOTAL, CKMB, CKMBINDEX, TROPONINI in the last 168 hours. BNP: BNP (last 3 results) Recent Labs    09/11/20 1600 09/12/20 0708  BNP 1,413.0* 1,561.0*    ProBNP (last 3 results) No results for input(s): PROBNP in the last 8760 hours.  CBG: Recent Labs  Lab 11/24/20 1114 11/24/20 1557 11/24/20 2125 11/25/20 0735 11/25/20 1107  GLUCAP 164* 139* 131* 121* 160*       Signed:  Cristal Deer, MD [facility] 11/25/2020, 11:43 AM

## 2020-11-25 NOTE — Plan of Care (Signed)
Problem: Health Behavior/Discharge Planning: Goal: Ability to manage health-related needs will improve 11/25/2020 1215 by Adam Phenix, RN Outcome: Adequate for Discharge 11/25/2020 0758 by Adam Phenix, RN Outcome: Progressing   Problem: Clinical Measurements: Goal: Ability to maintain clinical measurements within normal limits will improve 11/25/2020 1215 by Adam Phenix, RN Outcome: Adequate for Discharge 11/25/2020 0758 by Adam Phenix, RN Outcome: Progressing Goal: Will remain free from infection 11/25/2020 1215 by Adam Phenix, RN Outcome: Adequate for Discharge 11/25/2020 0758 by Adam Phenix, RN Outcome: Progressing Goal: Diagnostic test results will improve 11/25/2020 1215 by Adam Phenix, RN Outcome: Adequate for Discharge 11/25/2020 0758 by Adam Phenix, RN Outcome: Progressing Goal: Respiratory complications will improve 11/25/2020 1215 by Adam Phenix, RN Outcome: Adequate for Discharge 11/25/2020 0758 by Adam Phenix, RN Outcome: Progressing Goal: Cardiovascular complication will be avoided 11/25/2020 1215 by Adam Phenix, RN Outcome: Adequate for Discharge 11/25/2020 0758 by Adam Phenix, RN Outcome: Progressing   Problem: Activity: Goal: Risk for activity intolerance will decrease 11/25/2020 1215 by Adam Phenix, RN Outcome: Adequate for Discharge 11/25/2020 0758 by Adam Phenix, RN Outcome: Progressing   Problem: Nutrition: Goal: Adequate nutrition will be maintained 11/25/2020 1215 by Adam Phenix, RN Outcome: Adequate for Discharge 11/25/2020 0758 by Adam Phenix, RN Outcome: Progressing   Problem: Coping: Goal: Level of anxiety will decrease 11/25/2020 1215 by Adam Phenix, RN Outcome: Adequate for Discharge 11/25/2020 0758 by Adam Phenix, RN Outcome: Progressing   Problem: Elimination: Goal: Will not experience  complications related to bowel motility 11/25/2020 1215 by Adam Phenix, RN Outcome: Adequate for Discharge 11/25/2020 0758 by Adam Phenix, RN Outcome: Progressing Goal: Will not experience complications related to urinary retention 11/25/2020 1215 by Adam Phenix, RN Outcome: Adequate for Discharge 11/25/2020 0758 by Adam Phenix, RN Outcome: Progressing   Problem: Pain Managment: Goal: General experience of comfort will improve 11/25/2020 1215 by Adam Phenix, RN Outcome: Adequate for Discharge 11/25/2020 0758 by Adam Phenix, RN Outcome: Progressing   Problem: Safety: Goal: Ability to remain free from injury will improve 11/25/2020 1215 by Adam Phenix, RN Outcome: Adequate for Discharge 11/25/2020 0758 by Adam Phenix, RN Outcome: Progressing   Problem: Skin Integrity: Goal: Risk for impaired skin integrity will decrease 11/25/2020 1215 by Adam Phenix, RN Outcome: Adequate for Discharge 11/25/2020 0758 by Adam Phenix, RN Outcome: Progressing   Problem: Education: Goal: Ability to demonstrate management of disease process will improve 11/25/2020 1215 by Adam Phenix, RN Outcome: Adequate for Discharge 11/25/2020 0758 by Adam Phenix, RN Outcome: Progressing Goal: Ability to verbalize understanding of medication therapies will improve 11/25/2020 1215 by Adam Phenix, RN Outcome: Adequate for Discharge 11/25/2020 0758 by Adam Phenix, RN Outcome: Progressing Goal: Individualized Educational Video(s) 11/25/2020 1215 by Adam Phenix, RN Outcome: Adequate for Discharge 11/25/2020 0758 by Adam Phenix, RN Outcome: Progressing   Problem: Activity: Goal: Capacity to carry out activities will improve 11/25/2020 1215 by Adam Phenix, RN Outcome: Adequate for Discharge 11/25/2020 0758 by Adam Phenix, RN Outcome: Progressing   Problem:  Cardiac: Goal: Ability to achieve and maintain adequate cardiopulmonary perfusion will improve 11/25/2020 1215 by Adam Phenix, RN Outcome: Adequate for Discharge 11/25/2020 0758 by Adam Phenix, RN Outcome: Progressing   Problem: Fluid Volume: Goal: Hemodynamic stability will improve 11/25/2020 1215 by Adam Phenix, RN Outcome: Adequate for Discharge  11/25/2020 0758 by Adam Phenix, RN Outcome: Progressing   Problem: Clinical Measurements: Goal: Diagnostic test results will improve 11/25/2020 1215 by Adam Phenix, RN Outcome: Adequate for Discharge 11/25/2020 0758 by Adam Phenix, RN Outcome: Progressing Goal: Signs and symptoms of infection will decrease 11/25/2020 1215 by Adam Phenix, RN Outcome: Adequate for Discharge 11/25/2020 0758 by Adam Phenix, RN Outcome: Progressing   Problem: Respiratory: Goal: Ability to maintain adequate ventilation will improve 11/25/2020 1215 by Adam Phenix, RN Outcome: Adequate for Discharge 11/25/2020 0758 by Adam Phenix, RN Outcome: Progressing   Problem: Acute Rehab PT Goals(only PT should resolve) Goal: Pt will Roll Supine to Side Outcome: Adequate for Discharge Goal: Pt Will Go Supine/Side To Sit Outcome: Adequate for Discharge Goal: Patient Will Transfer Sit To/From Stand Outcome: Adequate for Discharge Goal: Pt Will Transfer Bed To Chair/Chair To Bed Outcome: Adequate for Discharge Goal: Pt Will Ambulate Outcome: Adequate for Discharge   Problem: Acute Rehab OT Goals (only OT should resolve) Goal: Pt. Will Perform Grooming Outcome: Adequate for Discharge Goal: Pt. Will Perform Upper Body Dressing Outcome: Adequate for Discharge Goal: Pt. Will Perform Lower Body Dressing Outcome: Adequate for Discharge Goal: Pt. Will Transfer To Toilet Outcome: Adequate for Discharge Goal: Pt/Caregiver Will Perform Home Exercise Program Outcome: Adequate for  Discharge

## 2020-11-25 NOTE — Plan of Care (Signed)
  Problem: Health Behavior/Discharge Planning: Goal: Ability to manage health-related needs will improve Outcome: Progressing   Problem: Clinical Measurements: Goal: Ability to maintain clinical measurements within normal limits will improve Outcome: Progressing Goal: Will remain free from infection Outcome: Progressing Goal: Diagnostic test results will improve Outcome: Progressing Goal: Respiratory complications will improve Outcome: Progressing Goal: Cardiovascular complication will be avoided Outcome: Progressing   Problem: Activity: Goal: Risk for activity intolerance will decrease Outcome: Progressing   Problem: Nutrition: Goal: Adequate nutrition will be maintained Outcome: Progressing   Problem: Coping: Goal: Level of anxiety will decrease Outcome: Progressing   Problem: Elimination: Goal: Will not experience complications related to bowel motility Outcome: Progressing Goal: Will not experience complications related to urinary retention Outcome: Progressing   Problem: Pain Managment: Goal: General experience of comfort will improve Outcome: Progressing   Problem: Safety: Goal: Ability to remain free from injury will improve Outcome: Progressing   Problem: Skin Integrity: Goal: Risk for impaired skin integrity will decrease Outcome: Progressing   Problem: Education: Goal: Ability to demonstrate management of disease process will improve Outcome: Progressing Goal: Ability to verbalize understanding of medication therapies will improve Outcome: Progressing Goal: Individualized Educational Video(s) Outcome: Progressing   Problem: Activity: Goal: Capacity to carry out activities will improve Outcome: Progressing   Problem: Cardiac: Goal: Ability to achieve and maintain adequate cardiopulmonary perfusion will improve Outcome: Progressing   Problem: Fluid Volume: Goal: Hemodynamic stability will improve Outcome: Progressing   Problem: Clinical  Measurements: Goal: Diagnostic test results will improve Outcome: Progressing Goal: Signs and symptoms of infection will decrease Outcome: Progressing   Problem: Respiratory: Goal: Ability to maintain adequate ventilation will improve Outcome: Progressing

## 2020-11-26 LAB — GLUCOSE, CAPILLARY
Glucose-Capillary: 122 mg/dL — ABNORMAL HIGH (ref 70–99)
Glucose-Capillary: 107 mg/dL — ABNORMAL HIGH (ref 70–99)
Glucose-Capillary: 141 mg/dL — ABNORMAL HIGH (ref 70–99)

## 2020-11-26 MED ORDER — OXYCODONE-ACETAMINOPHEN 5-325 MG PO TABS: 1.0000 | ORAL_TABLET | ORAL | 0 refills | Status: AC | PRN

## 2020-11-26 NOTE — TOC Transition Note (Signed)
Transition of Care [facility]) - CM/SW Discharge Note   Patient Details  Name: Brett Williamson MRN: [id number] Date of Birth: [birth year]  Transition of Care [facility]) CM/SW Contact:  Salome Arnt, LCSW Phone Number: 11/26/2020, 10:59 AM   Clinical Narrative:   Pt d/c today. LCSW confirmed with Vaughan Basta with [facility] and Pam with [facility] that they are set up to start IV antibiotics this afternoon. Pt's daughter aware and agreeable and requests [facility] transport home. LCSW set up transport.     Final next level of care: [place] Barriers to Discharge: Barriers Resolved   Patient Goals and CMS Choice Patient states their goals for this hospitalization and ongoing recovery are:: [facility] CMS Medicare.gov Compare Post Acute Care list provided to:: Patient Choice offered to / list presented to : Patient  Discharge Placement                  Name of family member notified: [name]- daughter Patient and family notified of of transfer: 11/26/20  Discharge Plan and Services In-house Referral: Clinical Social Work              DME Arranged: N/A DME Agency: NA       HH Arranged: RN,PT,IV Antibiotics HH Agency: [place] [facility]) Date HH Agency Contacted: 11/26/20 Time [place]: [id number] Representative spoke with at [place]: Vaughan Basta and Pam  Social Determinants of Health ([place]) Interventions     Readmission Risk Interventions Readmission Risk Prevention Plan 11/19/2020  Transportation Screening Complete  HRI or [place] Complete  Social Work Consult for [employer] Planning/Counseling Complete  Medication Review Press photographer) Complete

## 2020-11-26 NOTE — Progress Notes (Signed)
Patient seen and evaluated at bedside this a.m. with no acute events or concerns noted overnight.  Please refer to discharge summary dictated 4/17 for full details regarding this hospitalization.  Patient has received PICC line access and is stable for discharge  Total care time: 20 minutes.

## 2020-11-30 ENCOUNTER — Other Ambulatory Visit (HOSPITAL_COMMUNITY): Payer: Self-pay | Admitting: Internal Medicine

## 2020-11-30 DIAGNOSIS — K862 Cyst of pancreas: Secondary | ICD-10-CM

## 2020-11-30 DIAGNOSIS — R160 Hepatomegaly, not elsewhere classified: Secondary | ICD-10-CM

## 2020-11-30 DIAGNOSIS — N2889 Other specified disorders of kidney and ureter: Secondary | ICD-10-CM

## 2020-12-04 ENCOUNTER — Encounter: Payer: Self-pay | Admitting: Cardiology

## 2020-12-04 NOTE — Progress Notes (Signed)
Virtual Visit via Telephone Note   This visit type was conducted due to national recommendations for restrictions regarding the COVID-19 Pandemic (e.g. social distancing) in an effort to limit this patient's exposure and mitigate transmission in our community.  Due to his co-morbid illnesses, this patient is at least at moderate risk for complications without adequate follow up.  This format is felt to be most appropriate for this patient at this time.  The patient did not have access to video technology/had technical difficulties with video requiring transitioning to audio format only (telephone).  All issues noted in this document were discussed and addressed.  No physical exam could be performed with this format.  Please refer to the patient's chart for his  consent to telehealth for [facility].    Date:  12/05/2020   ID:  Brett Williamson, DOB [birth year], MRN [id number] The patient was identified using 2 identifiers.  Patient Location: Home Provider Location: Office/Clinic   PCP:  Neale Burly, MD   [place]  Cardiologist:  Rozann Lesches, MD [phone]   Evaluation Performed:  Follow-Up Visit  Chief Complaint:  Cardiac follow-up  History of Present Illness:    Brett Williamson is a 80 y.o. male last assessed via telehealth encounter in early April by Ms. Billee Cashing with the structural heart team, I reviewed the note.  I spoke with Brett Williamson and family member by phone today.  He has a hospital bed at home, significant back pain and difficulty ambulating.  He continues on antibiotics and has home health nursing and PT.  I reviewed his records and updated the chart.  He was most recently admitted to the hospital with MSSA sepsis, no evidence of endocarditis by TEE.  He did have a history of recurrent UTIs, also foot ulcers and cellulitis, 6-week course of Ancef recommended by ID. 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  Cardiomyopathy has normalized by most recent imaging with LVEF 60 to 65%.  TAVR prosthesis also functioning normally with mean gradient 13 mmHg and trivial aortic regurgitation without paravalvular leak.  I reviewed his current medications which are outlined below.   Past Medical History:  Diagnosis Date  . Cardiomyopathy ([place])   . Chronic kidney disease (CKD), stage III (moderate) (HCC)   . Essential hypertension   . Frequent falls   . Morbid obesity ([place])   . RBBB   . Renal cell carcinoma ([place])    dx'd during TAVR work up  . 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 Burt Knack and Dr. Roxy Manns  . Severe aortic stenosis   . Subdural hematoma ([place]) 06/2020   After a mechanical fall  . Type 2 diabetes mellitus ([place])    Past Surgical History:  Procedure Laterality Date  . CRANIOTOMY Right 06/19/2020   Procedure: CRANIOTOMY HEMATOMA EVACUATION SUBDURAL;  Surgeon: Erline Levine, MD;  Location: [place];  Service: Neurosurgery;  Laterality: Right;  . RIGHT/LEFT HEART CATH AND CORONARY ANGIOGRAPHY N/A 09/18/2020   Procedure: RIGHT/LEFT HEART CATH AND CORONARY ANGIOGRAPHY;  Surgeon: Jolaine Artist, MD;  Location: [place] [facility];  Service: Cardiovascular;  Laterality: N/A;  . ROTATOR CUFF REPAIR    . TEE WITHOUT CARDIOVERSION N/A 09/25/2020   Procedure: TRANSESOPHAGEAL ECHOCARDIOGRAM (TEE);  Surgeon: Sherren Mocha, MD;  Location: [place] [facility];  Service: Open Heart Surgery;  Laterality: N/A;  . TEE WITHOUT CARDIOVERSION N/A 11/20/2020   Procedure: TRANSESOPHAGEAL ECHOCARDIOGRAM (TEE) WITH PROPOFOL;  Surgeon: Arnoldo Lenis, MD;  Location: [facility];  Service: Endoscopy;  Laterality: N/A;  . TRANSCATHETER AORTIC VALVE REPLACEMENT, TRANSFEMORAL N/A 09/25/2020   Procedure: TRANSCATHETER AORTIC VALVE REPLACEMENT, TRANSFEMORAL;  Surgeon: Sherren Mocha, MD;  Location: [place] [facility];  Service: Open Heart Surgery;  Laterality: N/A;     Current Meds  Medication Sig  . acetaminophen (TYLENOL) 325 MG tablet Take 2 tablets (650 mg total) by mouth every 6 (six) hours as needed. (Patient taking differently: Take 650 mg by mouth every 6 (six) hours as needed for mild pain.)  . amLODipine (NORVASC) 2.5 MG tablet Take 1 tablet (2.5 mg total) by mouth daily.  Marland Kitchen aspirin EC 81 MG tablet Take 1 tablet (81 mg total) by mouth daily. Swallow whole.  . carvedilol (COREG) 3.125 MG tablet Take 1 tablet (3.125 mg total) by mouth 2 (two) times daily with a meal.  . ceFAZolin (ANCEF) IVPB Inject 2 g into the vein every 8 (eight) hours. Indication:  bacteremia First Dose: Yes Last Day of Therapy:  12/27/2020 Labs - Once weekly:  CBC/D and BMP, Labs - Every other week:  ESR and CRP Method of administration: IV Push Method of administration may be changed at the discretion of home infusion pharmacist based upon assessment of the patient and/or caregiver's ability to self-administer the medication ordered.  . methocarbamol (ROBAXIN) 500 MG tablet Take 1 tablet (500 mg total) by mouth 3 (three) times daily.  Marland Kitchen oxyCODONE-acetaminophen (PERCOCET/ROXICET) 5-325 MG tablet Take 1 tablet by mouth every 4 (four) hours as needed for moderate pain or severe pain.  Marland Kitchen senna-docusate (SENOKOT-S) 8.6-50 MG tablet Take 2 tablets by mouth at bedtime.  . sulfamethoxazole-trimethoprim (BACTRIM DS) 800-160 MG tablet Take 1 tablet by mouth 2 (two) times daily.  . tamsulosin (FLOMAX) 0.4 MG CAPS capsule Take 1 capsule (0.4 mg total) by mouth daily after supper.     Allergies:   Patient has no known allergies.   ROS: No chest pain or palpitations.  Prior CV studies:   The following studies were reviewed today:  Echocardiogram 11/17/2020: 1. Left ventricular ejection fraction, by estimation, is 60 to 65%. The  left ventricle has normal function. The left ventricle has no  regional  wall motion abnormalities. There is mild concentric left ventricular  hypertrophy. Left ventricular diastolic  parameters are consistent with Grade I diastolic dysfunction (impaired  relaxation). Elevated left atrial pressure.  2. Right ventricular systolic function is normal. The right ventricular  size is normal. Tricuspid regurgitation signal is inadequate for assessing  PA pressure.  3. Left atrial size was moderately dilated.  4. The mitral valve is degenerative. Mild mitral valve regurgitation.  Moderate mitral annular calcification.  5. The aortic valve has been repaired/replaced. Procedure Date:  09/25/2020. A 27m Edwards Sapien TAVR valve is present and appears well  seated with normal function by Doppler interrogation. Mean gradient  169mg, DOI 0.44. Trivial central AI without  evidence of paravalvular leak.   TEE 11/20/2020: 1. Left ventricular ejection fraction, by estimation, is 60 to 65%. The  left ventricle has normal function.  2. Right ventricular systolic function is normal. The right ventricular  size is normal.  3. Left atrial size was mildly dilated. No left atrial/left atrial  appendage thrombus was detected. The LAA emptying velocity was 50 cm/s.  4. Right atrial size was  mildly dilated.  5. The mitral valve is normal in structure. Trivial mitral valve  regurgitation. No evidence of mitral stenosis.  6. A 68m Edwards Sapien TAVR valve is in the AV position with normal  function. . The aortic valve has been repaired/replaced. Aortic valve  regurgitation is not visualized. No aortic stenosis is present.   Conclusion(s)/Recommendation(s): No evidence of vegetation/infective endocarditis on this transesophageal echocardiogram.   Labs/Other Tests and Data Reviewed:    EKG:  An ECG dated 09/26/2020 was personally reviewed today and demonstrated:  Sinus rhythm with prolonged PR interval, right bundle branch block, rightward axis, anterolateral T  wave inversions.  Recent Labs: 09/11/2020: TSH 1.740 09/12/2020: B Natriuretic Peptide 1,561.0 09/26/2020: Magnesium 1.9 11/16/2020: ALT 18 11/25/2020: BUN 38; Creatinine, Ser 1.42; Hemoglobin 12.3; Platelets 239; Potassium 4.8; Sodium 127   Recent Lipid Panel Lab Results  Component Value Date/Time   CHOL 116 09/14/2020 01:27 AM   TRIG 80 09/14/2020 01:27 AM   HDL 26 (L) 09/14/2020 01:27 AM   CHOLHDL 4.5 09/14/2020 01:27 AM   LDLCALC 74 09/14/2020 01:27 AM    Wt Readings from Last 3 Encounters:  12/05/20 236 lb (107 kg)  11/21/20 247 lb 9.2 oz (112.3 kg)  09/29/20 240 lb 8.4 oz (109.1 kg)     Objective:    Vital Signs:  Ht _0  (1.803 m)   Wt 236 lb (107 kg)   BMI 32.92 kg/m    Patient spoke in short sentences, no obvious distress.  No breathlessness on the phone.  ASSESSMENT & PLAN:    1.  Aortic stenosis status post TAVR in February.  Valve function normal by recent follow-up echocardiogram, mean gradient 13 mmHg and trivial aortic regurgitation without paravalvular leak.  He is on aspirin at this point.  2.  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  3.  Recent hospitalization with MSSA sepsis, continue on outpatient Ancef via PICC line.  No obvious cardiac source, TEE negative for vegetations.  Did have foot ulcers and cellulitis as contributors.  4.  Suspected renal cell carcinoma based on imaging studies.  Pending further work-up by Urology.   Time:   Today, I have spent 11 minutes with the patient with telehealth technology discussing the above problems.     Medication Adjustments/Labs and Tests Ordered: Current medicines are reviewed at length with the patient today.  Concerns regarding medicines are outlined above.   Tests Ordered: No orders of the defined types were placed in this encounter.   Medication Changes: No orders of the defined types were placed in this  encounter.   Follow Up:  In Person 3 months.  Signed, SRozann Lesches MD  12/05/2020 8:48 AM    [facility].

## 2020-12-05 ENCOUNTER — Encounter: Payer: Self-pay | Admitting: Cardiology

## 2020-12-05 ENCOUNTER — Telehealth (INDEPENDENT_AMBULATORY_CARE_PROVIDER_SITE_OTHER): Payer: Self-pay | Admitting: Cardiology

## 2020-12-05 ENCOUNTER — Other Ambulatory Visit: Payer: Self-pay

## 2020-12-05 VITALS — Ht 71.0 in | Wt 236.0 lb

## 2020-12-05 DIAGNOSIS — Z952 Presence of prosthetic heart valve: Secondary | ICD-10-CM

## 2020-12-05 DIAGNOSIS — I428 Other cardiomyopathies: Secondary | ICD-10-CM

## 2020-12-05 NOTE — Patient Instructions (Signed)

## 2020-12-12 ENCOUNTER — Telehealth: Payer: Self-pay

## 2020-12-12 NOTE — Telephone Encounter (Signed)
Brett Williamson with [facility] called stating the patient's daughter called requesting the patient's picc line be removed. Per Brett Williamson patient's daughter stated patient is not going to Hospice and does not wish to continue IV antibiotic. Per Dr. Tommy Medal it is ok to pull the picc line.  I have given verbal orders to [facility] with [facility] to pull the picc line.  Staci Dack T Brooks Sailors

## 2020-12-13 NOTE — Telephone Encounter (Signed)
Patient's daughter [name] informed that we will cancel patient's appointment with Dr. Tommy Medal on 12/17/20 and we have given verbal orders for home health to pull patient's picc line. Patient daughter verbalized understanding.  Brett Williamson

## 2020-12-17 ENCOUNTER — Ambulatory Visit: Payer: Self-pay | Admitting: Infectious Disease

## 2020-12-26 ENCOUNTER — Ambulatory Visit: Payer: Self-pay | Admitting: Urology

## 2021-03-08 ENCOUNTER — Ambulatory Visit: Payer: Self-pay | Admitting: Cardiology

## 2021-04-11 DEATH — deceased

## 2021-10-02 IMAGING — MR MR HEAD W/O CM
12 of 13 series · 44 of 48 positions shown · non-contrast
Comparison: Head CT from 2 days ago

CLINICAL DATA: Subdural hematoma with decompression. Worsening
generalized weakness.

EXAM:
MRI HEAD WITHOUT CONTRAST
TECHNIQUE: Multiplanar, multiecho pulse sequences of the brain and surrounding
structures were obtained without intravenous contrast.

[Series 5: DWI · axial · 3.0mm · 0.88mm/px · z∈[-52,+88]mm · 8 of 104 slices shown (1 of 4)]
[im 1/104]
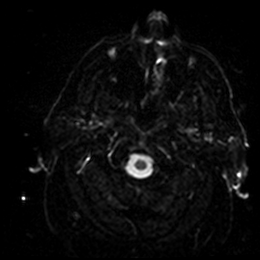
[im 15/104]
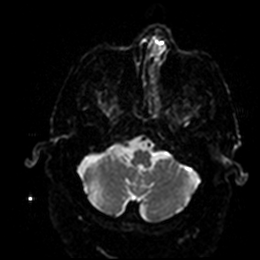
[im 30/104]
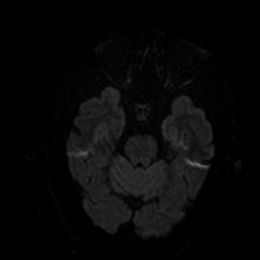
[im 45/104]
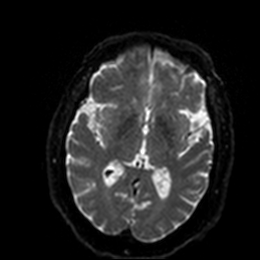
[im 59/104]
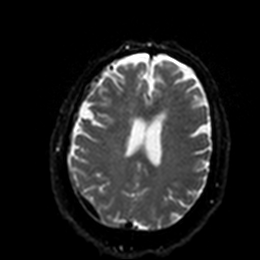
[im 74/104]
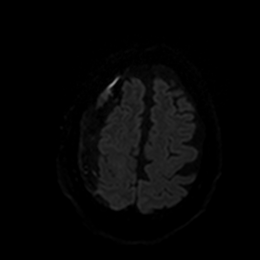
[im 89/104]
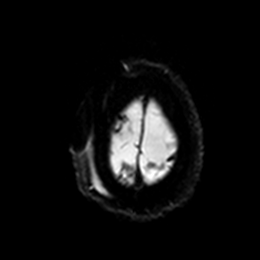
[im 104/104]
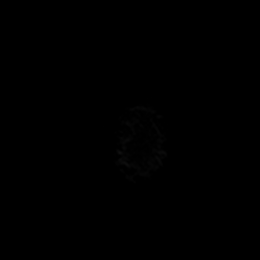

[Series 6: DWI · axial · 3.0mm · 0.88mm/px · z∈[-52,+88]mm · 4 of 52 slices shown (2 of 4)]
[im 1/52]
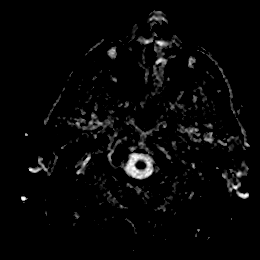
[im 18/52]
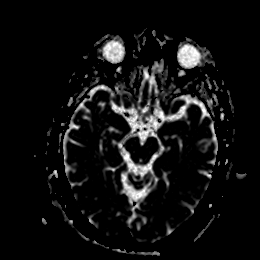
[im 35/52]
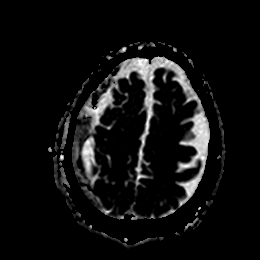
[im 52/52]
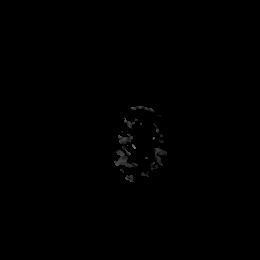

[Series 7: DWI · coronal · 4.0mm · 0.88mm/px · 6 of 76 slices shown (3 of 4)]
[im 1/76]
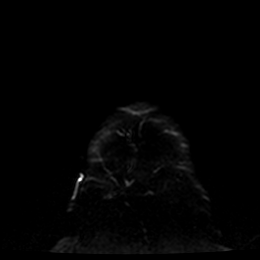
[im 16/76]
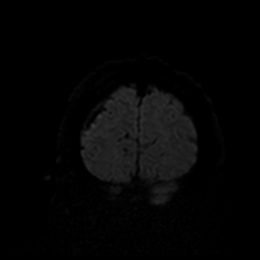
[im 31/76]
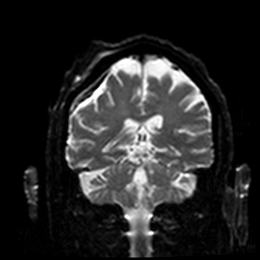
[im 46/76]
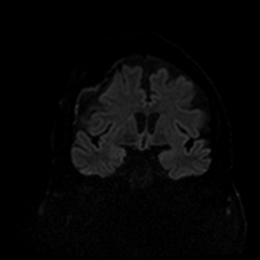
[im 61/76]
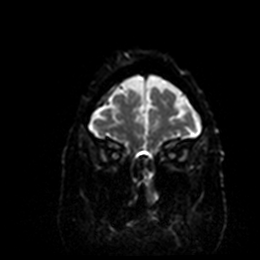
[im 76/76]
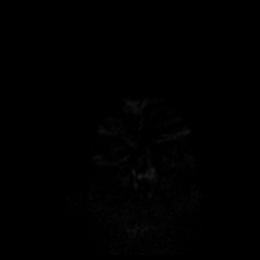

[Series 8: DWI · coronal · 4.0mm · 0.88mm/px · 3 of 38 slices shown (4 of 4)]
[im 1/38]
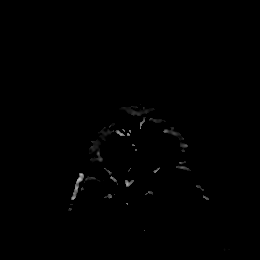
[im 19/38]
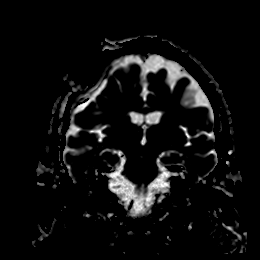
[im 38/38]
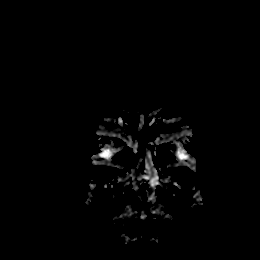

[Series 9: T1 · sagittal · 5.0mm · 0.75mm/px · 2 of 25 slices shown]
[im 1/25]
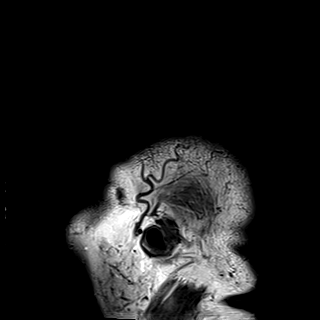
[im 25/25]
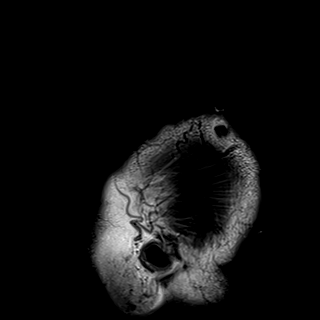

[Series 10: T2 · axial · 5.0mm · 0.72mm/px · z∈[-71,+82]mm · 2 of 28 slices shown (1 of 2)]
[im 1/28]
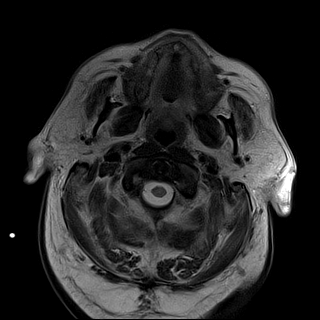
[im 28/28]
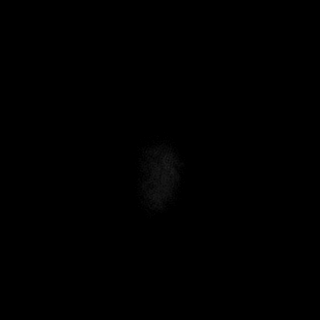

[Series 11: FLAIR · axial · 5.0mm · 0.45mm/px · z∈[-71,+82]mm · 2 of 28 slices shown]
[im 1/28]
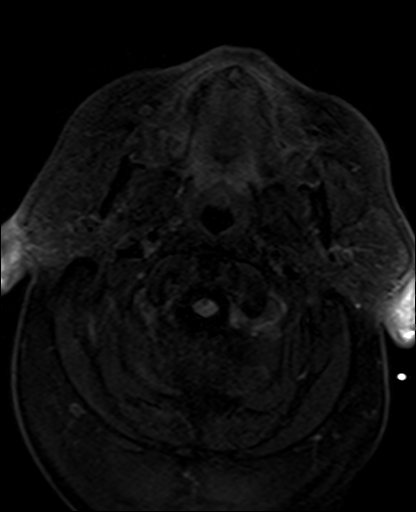
[im 28/28]
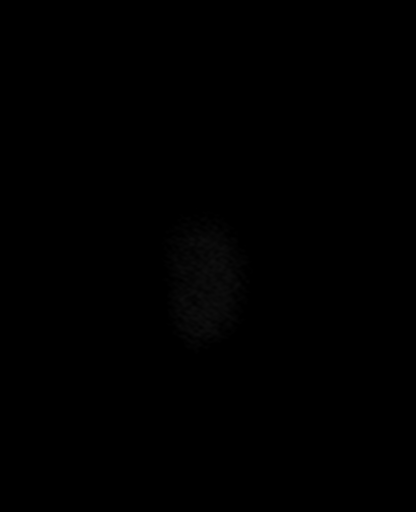

[Series 12: mag_images · axial · 3.0mm · 0.90mm/px · z∈[-67,+78]mm · 4 of 52 slices shown]
[im 1/52]
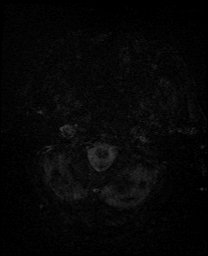
[im 18/52]
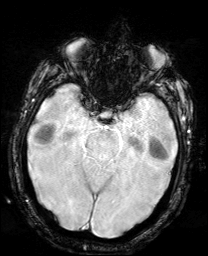
[im 35/52]
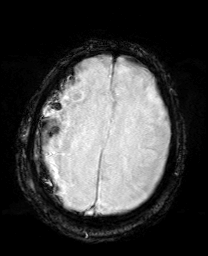
[im 52/52]
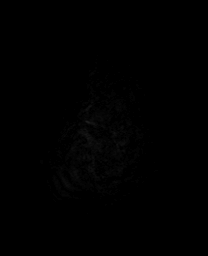

[Series 13: pha_images · axial · 3.0mm · 0.90mm/px · z∈[-67,+78]mm · 4 of 52 slices shown]
[im 1/52]
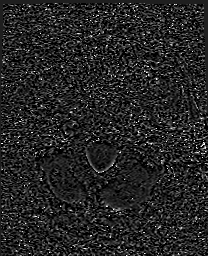
[im 18/52]
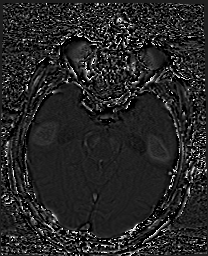
[im 35/52]
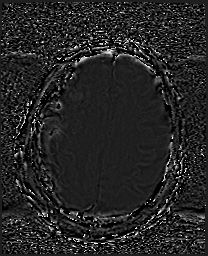
[im 52/52]
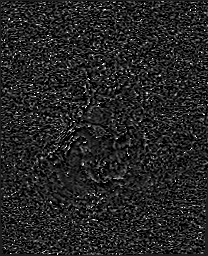

[Series 14: swi_images · axial · 3.0mm · 0.90mm/px · z∈[-67,+78]mm · 4 of 52 slices shown]
[im 1/52]
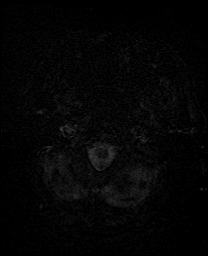
[im 18/52]
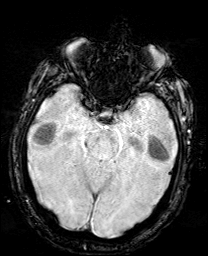
[im 35/52]
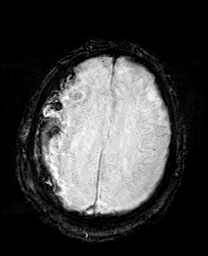
[im 52/52]
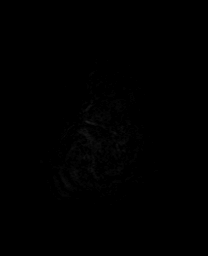

[Series 15: mip_images(sw) · axial · 24.0mm · 0.90mm/px · z∈[-57,+68]mm · 3 of 45 slices shown]
[im 1/45]
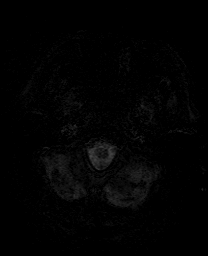
[im 23/45]
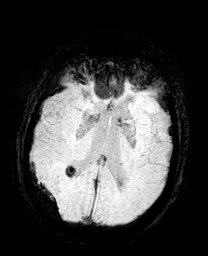
[im 45/45]
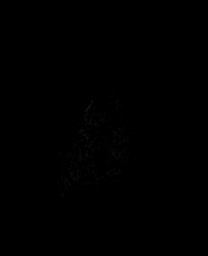

[Series 17: T2 · coronal · 5.0mm · 0.34mm/px · 2 of 32 slices shown (2 of 2)]
[im 1/32]
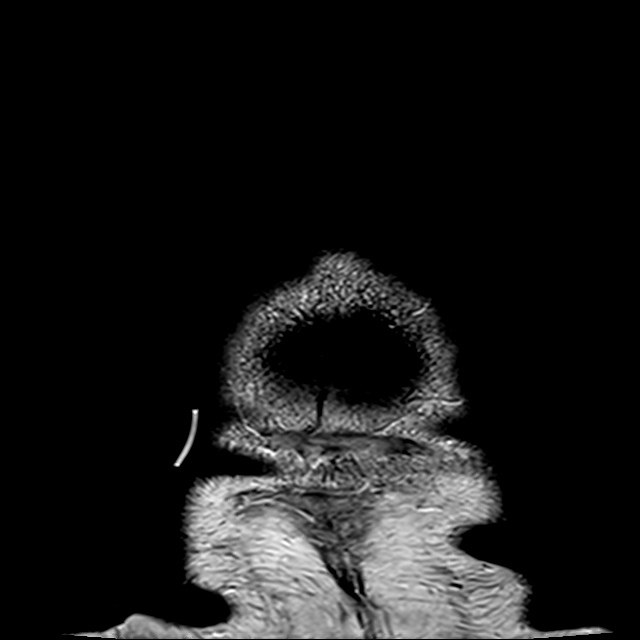
[im 32/32]
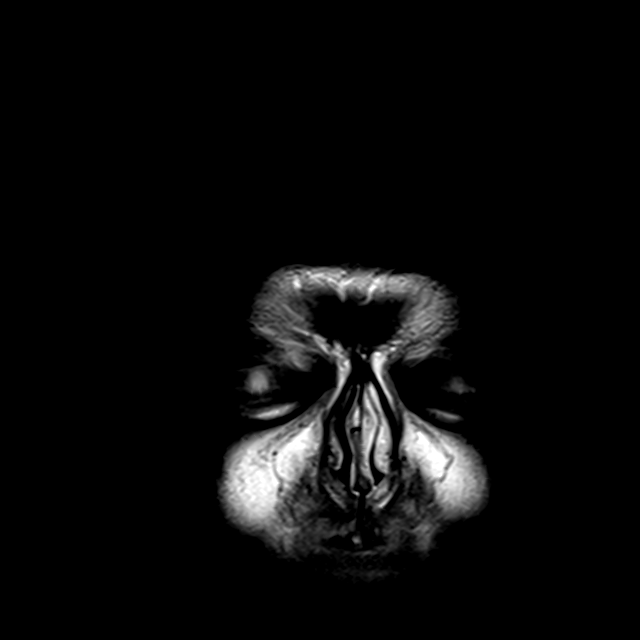

[44 of 48 positions shown; findings below may reference images not displayed]

FINDINGS: Brain: Interval decompression of subdural hematoma on the right,
with drain in place. The collection and mass effect has diminished
significantly. Maximal collection thickness is 11 mm today, as
measured on coronal T2 weighted imaging. Midline shift has decreased
to 3 mm (previously 7 mm). No acute infarct, hydrocephalus, or
masslike finding.

Vascular: Preserved flow voids

Skull and upper cervical spine: Normal marrow signal

Sinuses/Orbits: Negative
IMPRESSION: 1. Interval decompression of the subdural hematoma with
significantly improved mass effect. Midline shift is decreased to 3
mm.
2. The brain itself appears normal for age.  No acute infarct.
